# Patient Record
Sex: Male | Born: 1985 | Race: Black or African American | Hispanic: No | Marital: Single | Smoking: Current every day smoker
Health system: Southern US, Community
[De-identification: ages and names within clinical notes are randomized; demographics above are authoritative.]

## PROBLEM LIST (undated history)

## (undated) DIAGNOSIS — T7840XA Allergy, unspecified, initial encounter: Secondary | ICD-10-CM

## (undated) DIAGNOSIS — E669 Obesity, unspecified: Secondary | ICD-10-CM

## (undated) DIAGNOSIS — E119 Type 2 diabetes mellitus without complications: Secondary | ICD-10-CM

## (undated) DIAGNOSIS — I10 Essential (primary) hypertension: Secondary | ICD-10-CM

## (undated) DIAGNOSIS — E559 Vitamin D deficiency, unspecified: Secondary | ICD-10-CM

## (undated) DIAGNOSIS — F17200 Nicotine dependence, unspecified, uncomplicated: Secondary | ICD-10-CM

## (undated) DIAGNOSIS — J45909 Unspecified asthma, uncomplicated: Secondary | ICD-10-CM

## (undated) DIAGNOSIS — E785 Hyperlipidemia, unspecified: Secondary | ICD-10-CM

## (undated) HISTORY — DX: Nicotine dependence, unspecified, uncomplicated: F17.200

## (undated) HISTORY — DX: Hyperlipidemia, unspecified: E78.5

## (undated) HISTORY — DX: Obesity, unspecified: E66.9

## (undated) HISTORY — DX: Allergy, unspecified, initial encounter: T78.40XA

## (undated) HISTORY — DX: Essential (primary) hypertension: I10

## (undated) HISTORY — DX: Type 2 diabetes mellitus without complications: E11.9

## (undated) HISTORY — DX: Vitamin D deficiency, unspecified: E55.9

---
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
## 2004-01-04 ENCOUNTER — Emergency Department (HOSPITAL_COMMUNITY): Admission: EM | Admit: 2004-01-04 | Discharge: 2004-01-05 | Payer: Self-pay

## 2004-05-10 ENCOUNTER — Emergency Department (HOSPITAL_COMMUNITY): Admission: EM | Admit: 2004-05-10 | Discharge: 2004-05-11 | Payer: Self-pay | Admitting: Emergency Medicine

## 2005-03-09 ENCOUNTER — Emergency Department (HOSPITAL_COMMUNITY): Admission: EM | Admit: 2005-03-09 | Discharge: 2005-03-09 | Payer: Self-pay | Admitting: Emergency Medicine

## 2006-05-29 ENCOUNTER — Emergency Department (HOSPITAL_COMMUNITY): Admission: EM | Admit: 2006-05-29 | Discharge: 2006-05-29 | Payer: Self-pay | Admitting: Family Medicine

## 2016-08-06 ENCOUNTER — Emergency Department (HOSPITAL_COMMUNITY)
Admission: EM | Admit: 2016-08-06 | Discharge: 2016-08-07 | Disposition: A | Discharge status: Home / Self Care | Payer: Self-pay | Attending: Emergency Medicine | Admitting: Emergency Medicine

## 2016-08-06 ENCOUNTER — Encounter (HOSPITAL_COMMUNITY): Payer: Self-pay

## 2016-08-06 ENCOUNTER — Emergency Department (HOSPITAL_COMMUNITY): Payer: Self-pay

## 2016-08-06 DIAGNOSIS — J45909 Unspecified asthma, uncomplicated: Secondary | ICD-10-CM | POA: Insufficient documentation

## 2016-08-06 DIAGNOSIS — F17200 Nicotine dependence, unspecified, uncomplicated: Secondary | ICD-10-CM | POA: Insufficient documentation

## 2016-08-06 DIAGNOSIS — M7701 Medial epicondylitis, right elbow: Secondary | ICD-10-CM | POA: Insufficient documentation

## 2016-08-06 DIAGNOSIS — T1490XA Injury, unspecified, initial encounter: Secondary | ICD-10-CM

## 2016-08-06 HISTORY — DX: Unspecified asthma, uncomplicated: J45.909

## 2016-08-06 NOTE — ED Notes (Signed)
Pt states he tweeked his arm 2 weeks ago and the pain has progressively gotten worse. Pt states that tonight he cannot move same.

## 2016-08-06 NOTE — ED Triage Notes (Signed)
Pt states that for the past two weeks he hurt his L elbow at work, the pain keeps getting worse and today he is unable to move his arm, denies injury to arm.

## 2016-08-06 NOTE — ED Provider Notes (Signed)
[facility]-EMERGENCY DEPT Provider Note   CSN: [id number] Arrival date & time: 08/06/16  2256   By signing my name below, I, [name], attest that this documentation has been prepared under the direction and in the presence of Caleb FowlerKayla Topanga Alvelo, PA-C. Electronically Signed: Clarisse GougeXavier [name], Scribe. 08/06/16. 11:27 PM.   History   Chief Complaint Chief Complaint  Patient presents with  . Arm Pain   The history is provided by the patient. No language interpreter was used.    HPI Comments: [name] is a 31 y.o. male who presents to the Emergency Department complaining of gradual onset, moderate, episodic left elbow pain x 2 weeks. States pain is progressively worsening, and he notes movement exacerbates his pain. States extension/flexion and pronation also exacerbate pain. Pt believes he may have injured it. 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 Decreased ROM noted, and he states the elbow feels like it "needs to pop" when he flexes or extends his arm. Pt taking nothing at home for pain. States he is otherwise healthy. Denies wrist/shoulder pain, numbness, weakness, fever, chills, color changes. This has never happened before.   Past Medical History:  Diagnosis Date  . Asthma     There are no active problems to display for this patient.   History reviewed. No pertinent surgical history.     Home Medications    Prior to Admission medications   Medication Sig Start Date End Date Taking? Authorizing Provider  naproxen (NAPROSYN) 500 MG tablet Take 1 tablet (500 mg total) by mouth 2 (two) times daily. 08/07/16   Caleb FowlerKayla Petula Rotolo, PA-C    Family History No family history on file.  Social History Social History  Substance Use Topics  . Smoking status: Current Every Day Smoker  . Smokeless tobacco: Never Used  . Alcohol use No     Allergies   Patient has no known allergies.   Review of Systems Review  of Systems  Constitutional: Negative for activity change, chills, diaphoresis and fatigue.  Respiratory: Negative for chest tightness and shortness of breath.   Cardiovascular: Negative for chest pain.  Gastrointestinal: Negative for abdominal pain.  Musculoskeletal: Positive for arthralgias. Negative for myalgias.  Skin: Negative for wound.  Neurological: Negative for dizziness, weakness, light-headedness, numbness and headaches.  Psychiatric/Behavioral: Negative for confusion.     Physical Exam Updated Vital Signs BP 154/92   Pulse 93   Temp 98.7 F (37.1 C)   Resp 20   Ht 5\' 10"  (1.778 m)   Wt 265 lb (120.2 kg)   SpO2 100%   BMI 38.02 kg/m   Physical Exam  Constitutional: He is oriented to person, place, and time. He appears well-developed and well-nourished.  HENT:  Head: Normocephalic and atraumatic.  Right Ear: External ear normal.  Left Ear: External ear normal.  Eyes: Conjunctivae are normal. No scleral icterus.  Neck: No tracheal deviation present.  Cardiovascular:  Pulses:      Radial pulses are 2+ on the right side, and 2+ on the left side.  Pulmonary/Chest: Effort normal. No respiratory distress.  Abdominal: He exhibits no distension.  Musculoskeletal: Normal range of motion.  Right elbow: No tenderness. No swelling, warmth, or erythema.  Decreased ROM in flexion and extension due to pain. Pain with resisted forearm pronation.  Neurological: He is alert and oriented to person, place, and time.  5/5 strength throughout bilateral upper extremities. Sensation intact to light touch.   Skin: Skin is warm  and dry.  Psychiatric: He has a normal mood and affect. His behavior is normal.     ED Treatments / Results  DIAGNOSTIC STUDIES: Oxygen Saturation is 100% on RA, normal by my interpretation.    COORDINATION OF CARE: 11:27 PM Discussed treatment plan with pt at bedside and pt agreed to plan.  Labs (all labs ordered are listed, but only abnormal results are  displayed) Labs Reviewed - No data to display  EKG  EKG Interpretation None       Radiology Dg Elbow Complete Right (3+view)  Result Date: 08/07/2016 CLINICAL DATA:  RIGHT elbow pain since this morning, no known injury, question related to lifting heavy oxygen tank is at work EXAM: RIGHT ELBOW - COMPLETE 3+ VIEW COMPARISON:  None FINDINGS: Nonstandard imaging, elbow partially flexed on all views. Osseous mineralization normal. Joint spaces preserved. No acute fracture, dislocation, or bone destruction. No elbow joint effusion. IMPRESSION: No acute osseous abnormalities. Electronically Signed   By: Ulyses Southward M.D.   On: 08/07/2016 00:01    Procedures Procedures (including critical care time)  Medications Ordered in ED Medications  naproxen (NAPROSYN) tablet 500 mg (500 mg Oral Given 08/07/16 0128)  acetaminophen (TYLENOL) tablet 1,000 mg (1,000 mg Oral Given 08/07/16 0128)     Initial Impression / Assessment and Plan / ED Course  I have reviewed the triage vital signs and the nursing notes.  Pertinent labs & imaging results that were available during my care of the patient were reviewed by me and considered in my medical decision making (see chart for details).  Will order Xr and naproxen.  Clinical Course    Findings c/w in medial epicondylitis.  Neurovascularly intact.  No signs of overlying infection.  Xrays normal.  Recommend treatment with NSAIDs and ortho follow up.  Return precautions discussed.  Stable for discharge.   Final Clinical Impressions(s) / ED Diagnoses   Final diagnoses:  Medial epicondylitis of right elbow    New Prescriptions Discharge Medication List as of 08/07/2016 12:55 AM    START taking these medications   Details  naproxen (NAPROSYN) 500 MG tablet Take 1 tablet (500 mg total) by mouth 2 (two) times daily., Starting Sun 08/07/2016, Print       I personally performed the services described in this documentation, which was scribed in my  presence. The recorded information has been reviewed and is accurate.    Caleb Fowler, PA-C 08/07/16 0151    Melene Plan, DO 08/07/16 1610

## 2016-08-07 MED ORDER — ACETAMINOPHEN 500 MG PO TABS
1000.0000 mg | ORAL_TABLET | Freq: Once | ORAL | Status: AC
  Administered 2016-08-07: 1000 mg via ORAL
  Filled 2016-08-07: qty 2

## 2016-08-07 MED ORDER — NAPROXEN 250 MG PO TABS
500.0000 mg | ORAL_TABLET | Freq: Once | ORAL | Status: AC
  Administered 2016-08-07: 500 mg via ORAL
  Filled 2016-08-07: qty 2

## 2016-08-07 MED ORDER — NAPROXEN 500 MG PO TABS: 500.0000 mg | ORAL_TABLET | Freq: Two times a day (BID) | ORAL | 0 refills | Status: DC

## 2016-09-16 ENCOUNTER — Emergency Department (HOSPITAL_COMMUNITY)
Admission: EM | Admit: 2016-09-16 | Discharge: 2016-09-16 | Disposition: A | Discharge status: Home / Self Care | Payer: 59 | Attending: Emergency Medicine | Admitting: Emergency Medicine

## 2016-09-16 ENCOUNTER — Emergency Department (HOSPITAL_COMMUNITY): Payer: 59

## 2016-09-16 ENCOUNTER — Encounter (HOSPITAL_COMMUNITY): Payer: Self-pay | Admitting: Emergency Medicine

## 2016-09-16 DIAGNOSIS — S0990XA Unspecified injury of head, initial encounter: Secondary | ICD-10-CM | POA: Diagnosis present

## 2016-09-16 DIAGNOSIS — S0102XA Laceration with foreign body of scalp, initial encounter: Secondary | ICD-10-CM | POA: Diagnosis not present

## 2016-09-16 DIAGNOSIS — S0003XA Contusion of scalp, initial encounter: Secondary | ICD-10-CM | POA: Diagnosis not present

## 2016-09-16 DIAGNOSIS — Y9389 Activity, other specified: Secondary | ICD-10-CM | POA: Insufficient documentation

## 2016-09-16 DIAGNOSIS — Z23 Encounter for immunization: Secondary | ICD-10-CM | POA: Diagnosis not present

## 2016-09-16 DIAGNOSIS — Y92009 Unspecified place in unspecified non-institutional (private) residence as the place of occurrence of the external cause: Secondary | ICD-10-CM | POA: Insufficient documentation

## 2016-09-16 DIAGNOSIS — S0101XA Laceration without foreign body of scalp, initial encounter: Secondary | ICD-10-CM | POA: Diagnosis not present

## 2016-09-16 DIAGNOSIS — W1839XA Other fall on same level, initial encounter: Secondary | ICD-10-CM | POA: Insufficient documentation

## 2016-09-16 DIAGNOSIS — J45909 Unspecified asthma, uncomplicated: Secondary | ICD-10-CM | POA: Insufficient documentation

## 2016-09-16 DIAGNOSIS — S064X0A Epidural hemorrhage without loss of consciousness, initial encounter: Secondary | ICD-10-CM | POA: Diagnosis not present

## 2016-09-16 DIAGNOSIS — F17200 Nicotine dependence, unspecified, uncomplicated: Secondary | ICD-10-CM | POA: Diagnosis not present

## 2016-09-16 DIAGNOSIS — Y999 Unspecified external cause status: Secondary | ICD-10-CM | POA: Diagnosis not present

## 2016-09-16 DIAGNOSIS — S0181XA Laceration without foreign body of other part of head, initial encounter: Secondary | ICD-10-CM | POA: Diagnosis not present

## 2016-09-16 LAB — CBG MONITORING, ED
GLUCOSE-CAPILLARY: 402 mg/dL — AB (ref 65–99)
Glucose-Capillary: 299 mg/dL — ABNORMAL HIGH (ref 65–99)

## 2016-09-16 MED ORDER — LORAZEPAM 2 MG/ML IJ SOLN
1.0000 mg | Freq: Once | INTRAMUSCULAR | Status: AC
  Administered 2016-09-16: 1 mg via INTRAVENOUS

## 2016-09-16 MED ORDER — LIDOCAINE-EPINEPHRINE 1 %-1:100000 IJ SOLN
30.0000 mL | Freq: Once | INTRAMUSCULAR | Status: DC
  Filled 2016-09-16: qty 30

## 2016-09-16 MED ORDER — LIDOCAINE-EPINEPHRINE (PF) 1 %-1:200000 IJ SOLN
30.0000 mL | Freq: Once | INTRAMUSCULAR | Status: AC
  Administered 2016-09-16: 30 mL via INTRADERMAL
  Filled 2016-09-16: qty 30

## 2016-09-16 MED ORDER — DOXYCYCLINE HYCLATE 100 MG PO CAPS: 100.0000 mg | ORAL_CAPSULE | Freq: Two times a day (BID) | ORAL | 0 refills | Status: DC

## 2016-09-16 MED ORDER — INSULIN ASPART 100 UNIT/ML ~~LOC~~ SOLN
15.0000 [IU] | Freq: Once | SUBCUTANEOUS | Status: AC
  Administered 2016-09-16: 15 [IU] via SUBCUTANEOUS
  Filled 2016-09-16: qty 1

## 2016-09-16 MED ORDER — LORAZEPAM 2 MG/ML IJ SOLN
INTRAMUSCULAR | Status: AC
  Filled 2016-09-16: qty 1

## 2016-09-16 MED ORDER — TETANUS-DIPHTH-ACELL PERTUSSIS 5-2.5-18.5 LF-MCG/0.5 IM SUSP
0.5000 mL | Freq: Once | INTRAMUSCULAR | Status: AC
  Administered 2016-09-16: 0.5 mL via INTRAMUSCULAR
  Filled 2016-09-16: qty 0.5

## 2016-09-16 NOTE — ED Triage Notes (Signed)
Pt brought to ED by [facility] from home for a neck injury, pt was drinking alcohol all day long states he had 12 onz bear x 9 and couple of liquor shots. Pt hit a mirror with his head and break the glass, because he got upset. Pt denies any LOC, right side hematoma bleeding and right side back of his neck 1/2 inch lac bleeding, c collar on place by EMS. BP 158/102, HR 100 96% RA.

## 2016-09-16 NOTE — ED Notes (Signed)
Quick clot applied to wound per Dr. Request.  Pt to CT

## 2016-09-16 NOTE — ED Provider Notes (Addendum)
[facility] Provider Note   CSN: [id number] Arrival date & time: 09/16/16  0108  By signing my name below, I, Rosario Adie, attest that this documentation has been prepared under the direction and in the presence of Gilda Crease, MD. Electronically Signed: Rosario Adie, ED Scribe. 09/16/16. 1:23 AM.  History   Chief Complaint Chief Complaint  Patient presents with  . Neck Injury   The history is provided by the patient. No language interpreter was used.    HPI Comments: [name] is a 31 y.o. male BIB [facility], with a h/o asthma, who presents to the Emergency Department complaining of wound sustained to the right side of the neck which occurred prior to arrival. Per pt, he became upset tonight while drinking alcohol and shattered a mirror over his head, sustaining his wound. Bleeding is controlled with pressure dressing. No LOC. He notes associated sensation of foreign body to the wound. No other areas of pain or known injury. Pt states that prior to this incident he drank 9 12 oz beers and a couple shots of liquor. EMS placed a c-collar and controlled bleeding en route. No other noted treatments were administered. He denies nausea, vomiting, dizziness, or any other associated symptoms.   Past Medical History:  Diagnosis Date  . Asthma    There are no active problems to display for this patient.  History reviewed. No pertinent surgical history.  Home Medications    Prior to Admission medications   Medication Sig Start Date End Date Taking? Authorizing Provider  naproxen (NAPROSYN) 500 MG tablet Take 1 tablet (500 mg total) by mouth 2 (two) times daily. 08/07/16   Cheri Fowler, PA-C   Family History History reviewed. No pertinent family history.  Social History Social History  Substance Use Topics  . Smoking status: Current Every Day Smoker  . Smokeless tobacco: Never Used  . Alcohol use 7.8 oz/week    9 Cans of beer, 4 Shots of liquor per week    Allergies   Patient has no known allergies.  Review of Systems Review of Systems  Gastrointestinal: Negative for nausea and vomiting.  Musculoskeletal: Positive for myalgias and neck pain.  Skin: Positive for wound.  Neurological: Negative for dizziness and syncope.  All other systems reviewed and are negative.  Physical Exam Updated Vital Signs BP 147/87 (BP Location: Right Arm)   Pulse 79   Temp 98.7 F (37.1 C) (Axillary)   Resp 17   Ht 5\' 9"  (1.753 m)   Wt 275 lb (124.7 kg)   SpO2 95%   BMI 40.61 kg/m   Physical Exam  Constitutional: He is oriented to person, place, and time. He appears well-developed and well-nourished. No distress.  HENT:  Head: Normocephalic.  Right Ear: Hearing normal.  Left Ear: Hearing normal.  Nose: Nose normal.  Mouth/Throat: Oropharynx is clear and moist and mucous membranes are normal.  Large right parietal hematoma. 3.5cm linear laceration in posterior auricular scalp  Eyes: Conjunctivae and EOM are normal. Pupils are equal, round, and reactive to light.  Neck: Normal range of motion. Neck supple.  Cardiovascular: Regular rhythm, S1 normal and S2 normal.  Exam reveals no gallop and no friction rub.   No murmur heard. Pulmonary/Chest: Effort normal and breath sounds normal. No respiratory distress. He exhibits no tenderness.  Abdominal: Soft. Normal appearance and bowel sounds are normal. There is no hepatosplenomegaly. There is no tenderness. There is no rebound, no guarding, no tenderness at McBurney's point and  negative Murphy's sign. No hernia.  Musculoskeletal: Normal range of motion.  Neurological: He is alert and oriented to person, place, and time. He has normal strength. No cranial nerve deficit or sensory deficit. Coordination normal. GCS eye subscore is 4. GCS verbal subscore is 5. GCS motor subscore is 6.  Skin: Skin is warm, dry and intact. No rash noted. No cyanosis.  Psychiatric: He has a normal mood and affect. His speech is  normal and behavior is normal. Thought content normal.  Nursing note and vitals reviewed.     ED Treatments / Results  DIAGNOSTIC STUDIES: Oxygen Saturation is 97% on RA, normal by my interpretation.   COORDINATION OF CARE: 1:22 AM-Discussed next steps with pt. Pt verbalized understanding and is agreeable with the plan.   Labs (all labs ordered are listed, but only abnormal results are displayed) Labs Reviewed  CBG MONITORING, ED - Abnormal; Notable for the following:       Result Value   Glucose-Capillary 402 (*)    All other components within normal limits    EKG  EKG Interpretation None      Radiology Ct Head Wo Contrast  Result Date: 09/16/2016 CLINICAL DATA:  Fall laceration EXAM: CT HEAD WITHOUT CONTRAST CT CERVICAL SPINE WITHOUT CONTRAST TECHNIQUE: Multidetector CT imaging of the head and cervical spine was performed following the standard protocol without intravenous contrast. Multiplanar CT image reconstructions of the cervical spine were also generated. COMPARISON:  None. FINDINGS: CT HEAD FINDINGS Brain: No acute territorial infarction or intracranial hemorrhage is visualized. There is no intracranial mass. The ventricles are nonenlarged. Vascular: No hyperdense vessel or unexpected calcification. Skull: No fracture. Sinuses/Orbits: No acute finding. Other: Large right parietal and posterior frontal scalp laceration and hematoma with soft tissue air bubbles. There is a a approximate 1.8 x 6.1 cm triangular-shaped foreign body within the scalp soft tissues suspicious for retained glass. Multiple smaller retained foreign bodies are visualized at the posterior, inferior margin of the dominant foreign body. CT CERVICAL SPINE FINDINGS Alignment: Straightening of the cervical spine. No subluxation. Facet alignment within normal limits. Skull base and vertebrae: No acute fracture. No primary bone lesion or focal pathologic process. Soft tissues and spinal canal: No prevertebral  fluid or swelling. No visible canal hematoma. Disc levels: No significant disc space narrowing or foraminal stenosis. Upper chest: Thyroid normal.  Lung apices clear. Other: Skin thickening and subcutaneous edema posteriorly. IMPRESSION: 1. No CT evidence for acute intracranial abnormality 2. Large right parietal and posterior frontal scalp laceration and hematoma. Triangular-shaped foreign body measuring at least 6.1 cm within the right scalp soft tissues suspicious for glass fragment. Multiple smaller foreign bodies are visualized along the posterior, inferior aspect of the dominant foreign body. 3. Straightening of the cervical spine. No acute fracture or malalignment. Posterior skin thickening and subcutaneous edema. Electronically Signed   By: Jasmine Pang M.D.   On: 09/16/2016 02:06   Ct Cervical Spine Wo Contrast  Result Date: 09/16/2016 CLINICAL DATA:  Fall laceration EXAM: CT HEAD WITHOUT CONTRAST CT CERVICAL SPINE WITHOUT CONTRAST TECHNIQUE: Multidetector CT imaging of the head and cervical spine was performed following the standard protocol without intravenous contrast. Multiplanar CT image reconstructions of the cervical spine were also generated. COMPARISON:  None. FINDINGS: CT HEAD FINDINGS Brain: No acute territorial infarction or intracranial hemorrhage is visualized. There is no intracranial mass. The ventricles are nonenlarged. Vascular: No hyperdense vessel or unexpected calcification. Skull: No fracture. Sinuses/Orbits: No acute finding. Other: Large right parietal and posterior  frontal scalp laceration and hematoma with soft tissue air bubbles. There is a a approximate 1.8 x 6.1 cm triangular-shaped foreign body within the scalp soft tissues suspicious for retained glass. Multiple smaller retained foreign bodies are visualized at the posterior, inferior margin of the dominant foreign body. CT CERVICAL SPINE FINDINGS Alignment: Straightening of the cervical spine. No subluxation. Facet  alignment within normal limits. Skull base and vertebrae: No acute fracture. No primary bone lesion or focal pathologic process. Soft tissues and spinal canal: No prevertebral fluid or swelling. No visible canal hematoma. Disc levels: No significant disc space narrowing or foraminal stenosis. Upper chest: Thyroid normal.  Lung apices clear. Other: Skin thickening and subcutaneous edema posteriorly. IMPRESSION: 1. No CT evidence for acute intracranial abnormality 2. Large right parietal and posterior frontal scalp laceration and hematoma. Triangular-shaped foreign body measuring at least 6.1 cm within the right scalp soft tissues suspicious for glass fragment. Multiple smaller foreign bodies are visualized along the posterior, inferior aspect of the dominant foreign body. 3. Straightening of the cervical spine. No acute fracture or malalignment. Posterior skin thickening and subcutaneous edema. Electronically Signed   By: Jasmine Pang M.D.   On: 09/16/2016 02:06   Procedures .Foreign Body Removal Date/Time: 09/16/2016 3:29 AM Performed by: Gilda Crease Authorized by: Gilda Crease  Consent: Verbal consent obtained. Risks and benefits: risks, benefits and alternatives were discussed Consent given by: patient Patient understanding: patient states understanding of the procedure being performed Patient consent: the patient's understanding of the procedure matches consent given Procedure consent: procedure consent matches procedure scheduled Relevant documents: relevant documents present and verified Test results: test results available and properly labeled Site marked: the operative site was marked Imaging studies: imaging studies available Required items: required blood products, implants, devices, and special equipment available Patient identity confirmed: verbally with patient and arm band Time out: Immediately prior to procedure a "time out" was called to verify the correct  patient, procedure, equipment, support staff and site/side marked as required. Intake: right parietal region. Anesthesia: local infiltration  Anesthesia: Local Anesthetic: lidocaine 1% with epinephrine Anesthetic total: 15 mL  Sedation: Patient sedated: no Patient restrained: no Patient cooperative: yes 1 objects recovered. Objects recovered: triangular piece of glass Post-procedure assessment: foreign body removed Patient tolerance: Patient tolerated the procedure well with no immediate complications .Marland KitchenLaceration Repair Date/Time: 09/16/2016 3:29 AM Performed by: Gilda Crease Authorized by: Gilda Crease   Consent:    Consent obtained:  Verbal   Consent given by:  Patient   Risks discussed:  Infection, pain, poor cosmetic result and poor wound healing   Alternatives discussed:  No treatment Anesthesia (see MAR for exact dosages):    Anesthesia method:  Local infiltration   Local anesthetic:  Lidocaine 1% WITH epi Laceration details:    Location:  Scalp   Scalp location:  R parietal   Length (cm):  3.5 Repair type:    Repair type:  Simple Pre-procedure details:    Preparation:  Patient was prepped and draped in usual sterile fashion and imaging obtained to evaluate for foreign bodies Exploration:    Hemostasis achieved with:  Epinephrine   Wound exploration: wound explored through full range of motion and entire depth of wound probed and visualized     Wound extent: foreign bodies/material     Contaminated: no   Treatment:    Area cleansed with:  Saline and Betadine   Amount of cleaning:  Standard   Irrigation solution:  Sterile saline  Irrigation method:  Syringe and pressure wash   Visualized foreign bodies/material removed: yes   Skin repair:    Repair method:  Staples   Number of staples:  7 Approximation:    Approximation:  Close   Vermilion border: well-aligned   Post-procedure details:    Dressing:  Open (no dressing)   Patient  tolerance of procedure:  Tolerated well, no immediate complications    Medications Ordered in ED Medications  LORazepam (ATIVAN) 2 MG/ML injection (0 mg  Hold 09/16/16 0333)  Tdap (BOOSTRIX) injection 0.5 mL (0.5 mLs Intramuscular Given 09/16/16 0157)  lidocaine-EPINEPHrine (XYLOCAINE-EPINEPHrine) 1 %-1:200000 (PF) injection 30 mL (30 mLs Intradermal Given 09/16/16 0302)  insulin aspart (novoLOG) injection 15 Units (15 Units Subcutaneous Given 09/16/16 0332)  LORazepam (ATIVAN) injection 1 mg (1 mg Intravenous Given 09/16/16 0324)   Initial Impression / Assessment and Plan / ED Course  I have reviewed the triage vital signs and the nursing notes.  Pertinent labs & imaging results that were available during my care of the patient were reviewed by me and considered in my medical decision making (see chart for details).     Patient presents to the emergency department for evaluation of injury to the right side of his head and neck. Patient admits to drinking alcohol all day long and becoming distraught. He reportedly grabbed a mere embrocated over his head. He reports that he was very upset when this happened, now realizes this was a bad thing to do. He is remorseful and upset that he put himself in this situation. He is not homicidal or suicidal at this time.  Patient had a moderate amount of bleeding still present at arrival. CT scan was performed and does show a significant piece of glass in the scalp. Area was anesthetized with lidocaine and epinephrine. Bleeding was controlled. Exploratoration of the wound located the foreign body and it was removed with forceps in its entirety. Wound was irrigated with copious amounts of saline. Staples were utilized to close the scalp. Patient will need staple removal in 10 days. He will be placed on empiric antbiotics.  Addendum: 1610: 0705 AM - patient now awake and alert. Once again he confirms that he is not homicidal or suicidal. Was given outpatient resources  for alcohol and depression.  Final Clinical Impressions(s) / ED Diagnoses   Final diagnoses:  Scalp laceration, initial encounter   New Prescriptions New Prescriptions   No medications on file   I personally performed the services described in this documentation, which was scribed in my presence. The recorded information has been reviewed and is accurate.     Gilda Creasehristopher J Carolena Fairbank, MD 09/16/16 [id number]    Gilda Creasehristopher J Peytyn Trine, MD 09/16/16 [phone]

## 2016-09-16 NOTE — ED Notes (Signed)
CBG for EMS 432 PTA.

## 2016-09-26 ENCOUNTER — Encounter (HOSPITAL_COMMUNITY): Payer: Self-pay | Admitting: Family Medicine

## 2016-09-26 ENCOUNTER — Ambulatory Visit (HOSPITAL_COMMUNITY)
Admission: EM | Admit: 2016-09-26 | Discharge: 2016-09-26 | Disposition: A | Discharge status: Home / Self Care | Payer: 59 | Attending: Family Medicine | Admitting: Family Medicine

## 2016-09-26 DIAGNOSIS — Z4802 Encounter for removal of sutures: Secondary | ICD-10-CM | POA: Diagnosis not present

## 2016-09-26 NOTE — ED Triage Notes (Signed)
Pt here for staple removal to head  

## 2016-09-26 NOTE — Discharge Instructions (Signed)
Return in 2 months if fluctuance persists

## 2016-09-26 NOTE — ED Provider Notes (Signed)
[facility]    CSN: [id number] Arrival date & time: 09/26/16  1214     History   Chief Complaint Chief Complaint  Patient presents with  . Suture / Staple Removal    HPI [name] is a 31 y.o. male.   Patient is presenting to the care center 10 days after having stitches put in the right side of his scalp for a laceration caused by broken glass. He remains quite swollen in the area but there is no tenderness or discharge. In addition he still has a subconjunctival hemorrhage on the right eye but much of the swelling around the eye is gone down.      Past Medical History:  Diagnosis Date  . Asthma     There are no active problems to display for this patient.   History reviewed. No pertinent surgical history.     Home Medications    Prior to Admission medications   Medication Sig Start Date End Date Taking? Authorizing Provider  doxycycline (VIBRAMYCIN) 100 MG capsule Take 1 capsule (100 mg total) by mouth 2 (two) times daily. 09/16/16   Gilda Creasehristopher J Pollina, MD  naproxen (NAPROSYN) 500 MG tablet Take 1 tablet (500 mg total) by mouth 2 (two) times daily. 08/07/16   Cheri FowlerKayla Rose, PA-C    Family History History reviewed. No pertinent family history.  Social History Social History  Substance Use Topics  . Smoking status: Current Every Day Smoker  . Smokeless tobacco: Never Used  . Alcohol use 7.8 oz/week    9 Cans of beer, 4 Shots of liquor per week     Allergies   Patient has no known allergies.   Review of Systems Review of Systems  Constitutional: Negative.   HENT: Negative.   Eyes: Positive for redness.  Skin: Positive for wound.  Neurological: Negative.      Physical Exam Triage Vital Signs ED Triage Vitals  Enc Vitals Group     BP      Pulse      Resp      Temp      Temp src      SpO2      Weight      Height      Head Circumference      Peak Flow      Pain Score      Pain Loc      Pain Edu?      Excl. in GC?    No  data found.   Updated Vital Signs There were no vitals taken for this visit.   Physical Exam  Constitutional: He is oriented to person, place, and time. He appears well-developed and well-nourished.  HENT:  Right Ear: External ear normal.  Left Ear: External ear normal.  Mouth/Throat: Oropharynx is clear and moist.  Eyes: EOM are normal. Pupils are equal, round, and reactive to light.  Right lateral subconjunctival hemorrhage  Neck: Normal range of motion. Neck supple.  Musculoskeletal: Normal range of motion.  Neurological: He is alert and oriented to person, place, and time.  Skin: Skin is warm and dry.  7 staples removed from right postauricular laceration area. Wound is well approximated and there is no drainage but there is significant subcutaneous cutaneous swelling and fluctuance underlying the laceration line.  Nursing note and vitals reviewed.    UC Treatments / Results  Labs (all labs ordered are listed, but only abnormal results are displayed) Labs Reviewed - No data to display  EKG  EKG Interpretation None       Radiology No results found.  Procedures Procedures (including critical care time)  Medications Ordered in UC Medications - No data to display   Initial Impression / Assessment and Plan / UC Course  I have reviewed the triage vital signs and the nursing notes.  Pertinent labs & imaging results that were available during my care of the patient were reviewed by me and considered in my medical decision making (see chart for details).     Final Clinical Impressions(s) / UC Diagnoses   Final diagnoses:  Removal of staples    New Prescriptions New Prescriptions   No medications on file     Elvina Sidle, MD 09/26/16 1238

## 2016-10-08 ENCOUNTER — Ambulatory Visit (HOSPITAL_COMMUNITY)
Admission: EM | Admit: 2016-10-08 | Discharge: 2016-10-08 | Disposition: A | Discharge status: Home / Self Care | Payer: 59 | Attending: Family Medicine | Admitting: Family Medicine

## 2016-10-08 ENCOUNTER — Encounter (HOSPITAL_COMMUNITY): Payer: Self-pay | Admitting: *Deleted

## 2016-10-08 DIAGNOSIS — Z5189 Encounter for other specified aftercare: Secondary | ICD-10-CM

## 2016-10-08 NOTE — Discharge Instructions (Signed)
Your wound appears to be well healing, the edges are approximated, no area that appears to have been bleeding earlier has scabbed over. Keep your wound clean, dry, you may continue to wash your hair, however be gentle when washing the area near your wound. It will take several weeks for the skin in this area to return to full strength. Should your wound open up, return to clinic, or go to the emergency room

## 2016-10-08 NOTE — ED Triage Notes (Signed)
Patient had staples removed from right scalpel region on the 5th, states today when he washed his hair he noticed bleeding. No bleeding at this time, wound looks well approximated and healing.

## 2016-10-08 NOTE — ED Provider Notes (Signed)
CSN: [id number]     Arrival date & time 10/08/16  1457 History   First MD Initiated Contact with Patient 10/08/16 1625     Chief Complaint  Patient presents with  . Suture / Staple Removal   (Consider location/radiation/quality/duration/timing/severity/associated sxs/prior Treatment) 31 year old male presents to clinic for evaluation of a wound to the right side of his head. One month ago he had cut the side of his head with a 4 inch piece of glass, it was subsequently closed with staples, and the staples were removed on 10/06/2016. He reports today's the first day he had thoroughly tried to clean his hair, and that he noticed blood coming down the side of his face. He denies any pain, is not currently bleeding, his wishes to have his wound examined.   The history is provided by the patient.    Past Medical History:  Diagnosis Date  . Asthma    History reviewed. No pertinent surgical history. History reviewed. No pertinent family history. Social History  Substance Use Topics  . Smoking status: Current Every Day Smoker  . Smokeless tobacco: Never Used  . Alcohol use 7.8 oz/week    9 Cans of beer, 4 Shots of liquor per week    Review of Systems  Reason unable to perform ROS: as covered in HPI.  All other systems reviewed and are negative.   Allergies  Patient has no known allergies.  Home Medications   Prior to Admission medications   Medication Sig Start Date End Date Taking? Authorizing Provider  doxycycline (VIBRAMYCIN) 100 MG capsule Take 1 capsule (100 mg total) by mouth 2 (two) times daily. 09/16/16   Gilda Creasehristopher J Pollina, MD  naproxen (NAPROSYN) 500 MG tablet Take 1 tablet (500 mg total) by mouth 2 (two) times daily. 08/07/16   Cheri FowlerKayla Rose, PA-C   Meds Ordered and Administered this Visit  Medications - No data to display  BP (!) 147/84 (BP Location: Right Arm)   Pulse 93   Temp 99 F (37.2 C) (Oral)   Resp 17   SpO2 98%  No data found.   Physical Exam   Constitutional: He is oriented to person, place, and time. He appears well-developed and well-nourished. No distress.  HENT:  Head: Normocephalic and atraumatic.  Right Ear: External ear normal.  Left Ear: External ear normal.  Neck: Normal range of motion. Neck supple.  Neurological: He is alert and oriented to person, place, and time.  Skin: Skin is warm and dry. Capillary refill takes less than 2 seconds. He is not diaphoretic.  4 cm, well healing, laceration with edges approximated, with a small area of scabbing. There is no sign of infection, and there is no active bleeding.  Psychiatric: He has a normal mood and affect. His behavior is normal.  Nursing note and vitals reviewed.   Urgent Care Course     Procedures (including critical care time)  Labs Review Labs Reviewed - No data to display  Imaging Review No results found.    MDM   1. Encounter for wound re-check     Patient given advice on hygiene regarding his wound, advised to be careful when cleaning around the area for the next several weeks. Should his laceration opened, return to clinic, or go to the emergency room. No further action needed at this time     Dorena BodoLawrence Giulio Bertino, NP 10/08/16 [phone]

## 2018-10-02 IMAGING — CT CT HEAD W/O CM
3 of 7 series · 14 of 47 positions shown, 17 images · non-contrast
Comparison: None.

CLINICAL DATA: Fall laceration

EXAM:
CT HEAD WITHOUT CONTRAST
CT CERVICAL SPINE WITHOUT CONTRAST
TECHNIQUE: Multidetector CT imaging of the head and cervical spine was
performed following the standard protocol without intravenous
contrast. Multiplanar CT image reconstructions of the cervical spine
were also generated.

[Series 6: head 3.0 mpr cor · coronal · 0.37mm/px · 3 of 79 slices shown]
[im 16/79  brain]
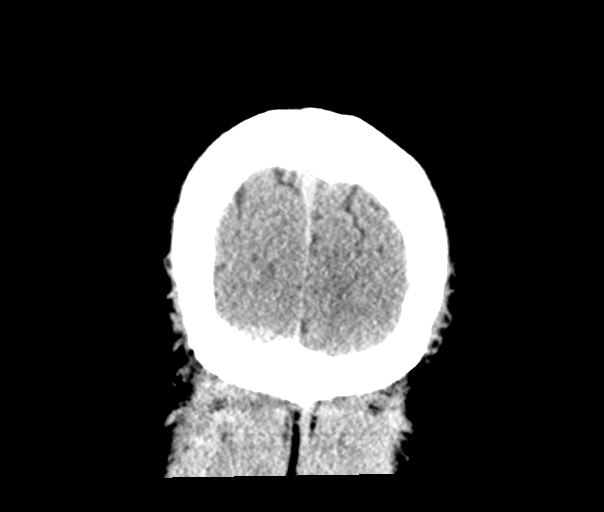
[im 32/79  brain]
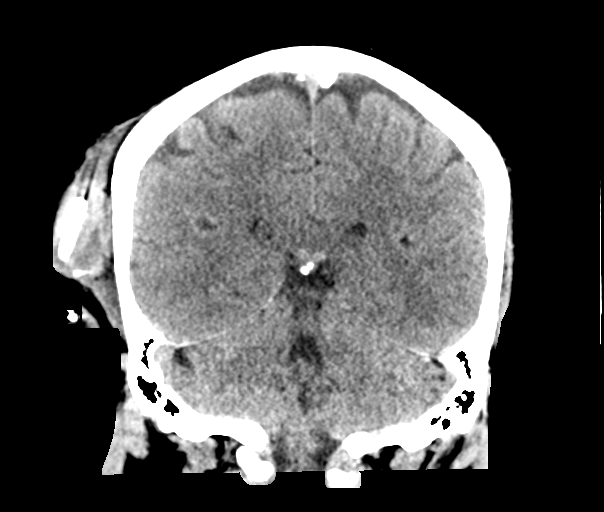
[im 47/79  brain]
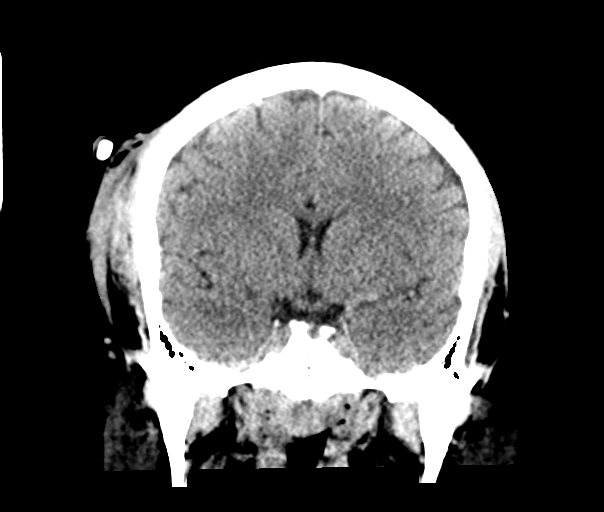

[Series 7: head 3.0 mpr sag · sagittal · 0.34mm/px · 2 of 67 slices shown]
[im 23/67  brain]
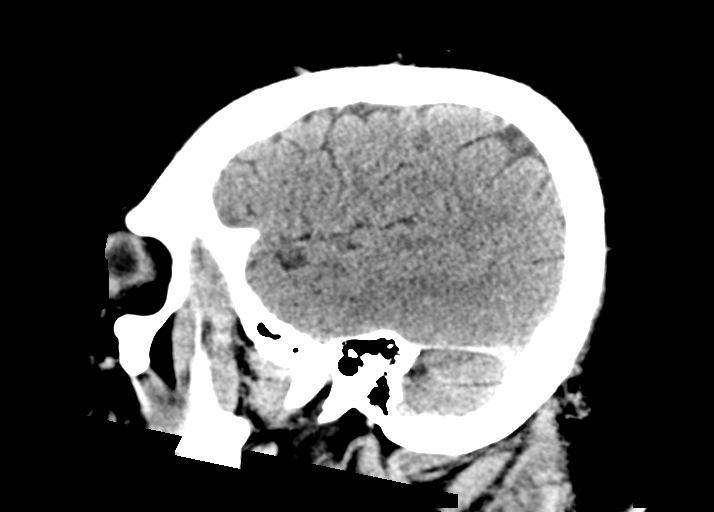
[im 45/67  brain]
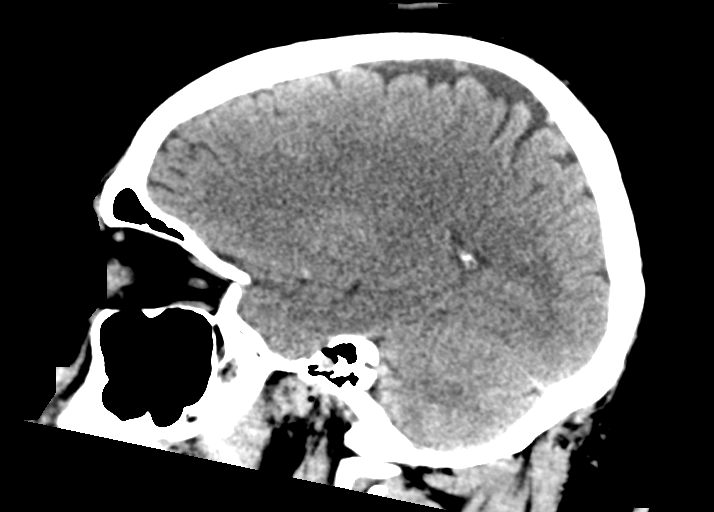

[Series 11: orthogonal axial st · axial · 0.21mm/px · z∈[-245,-112]mm · 9 of 86 slices shown, 12 images]
[im 8/86  brain]
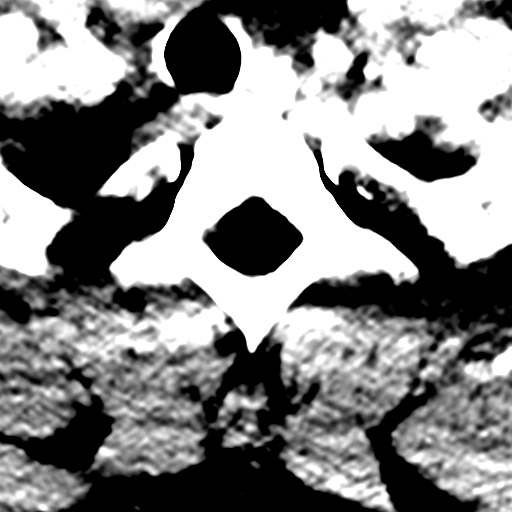
[im 8/86  bone]
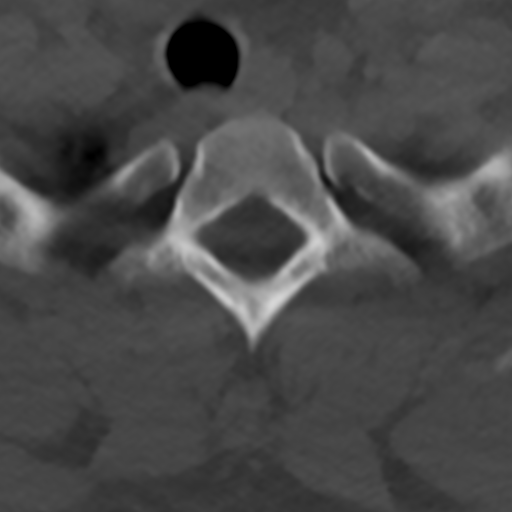
[im 16/86  brain]
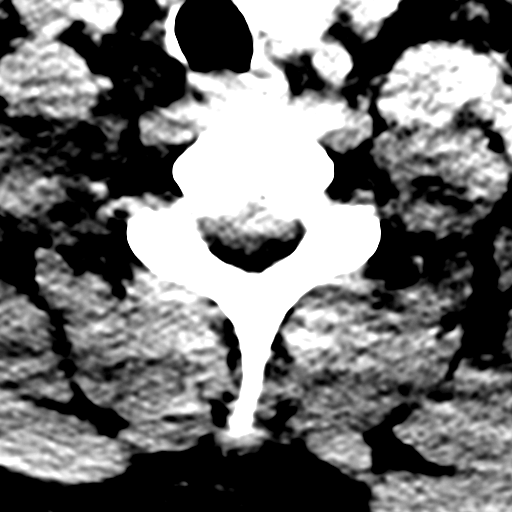
[im 24/86  brain]
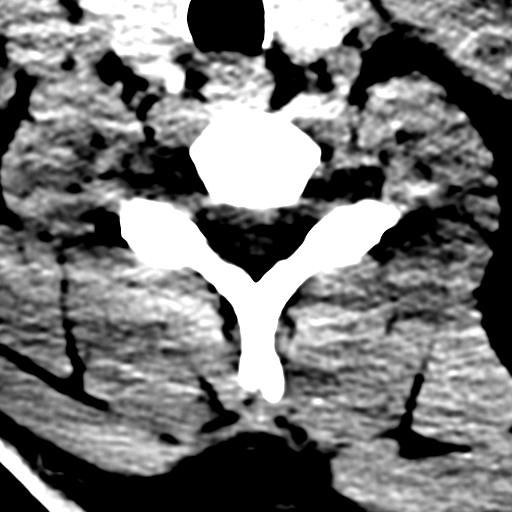
[im 31/86  brain]
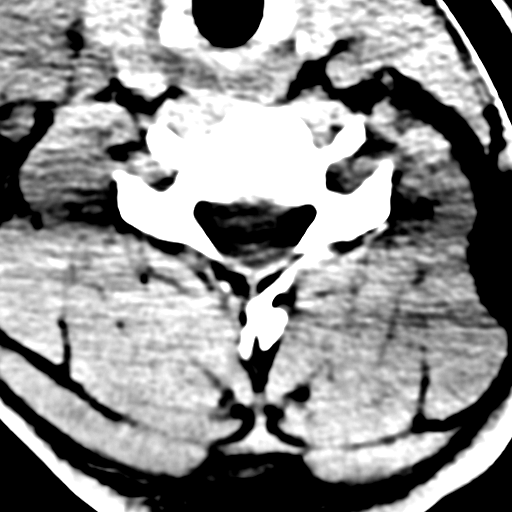
[im 47/86  brain]
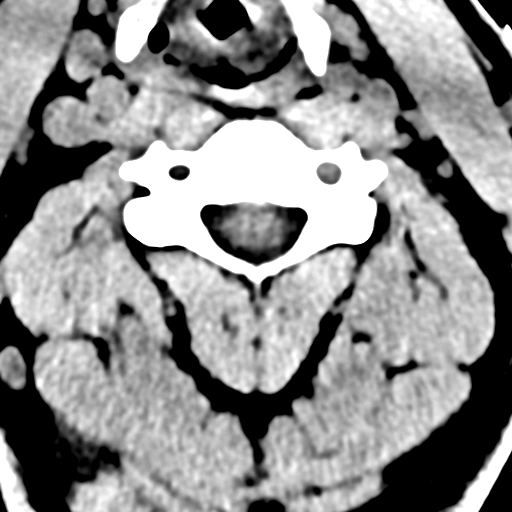
[im 47/86  bone]
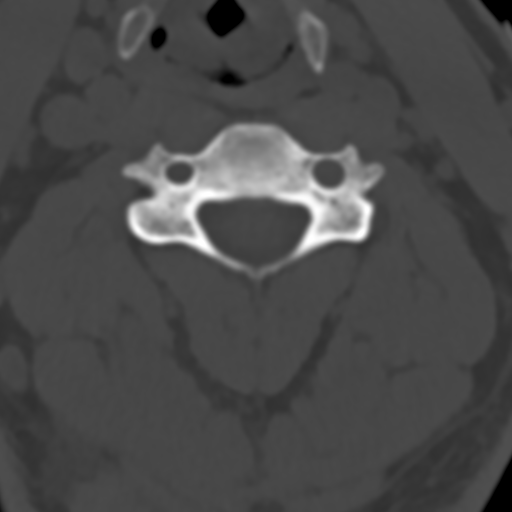
[im 55/86  brain]
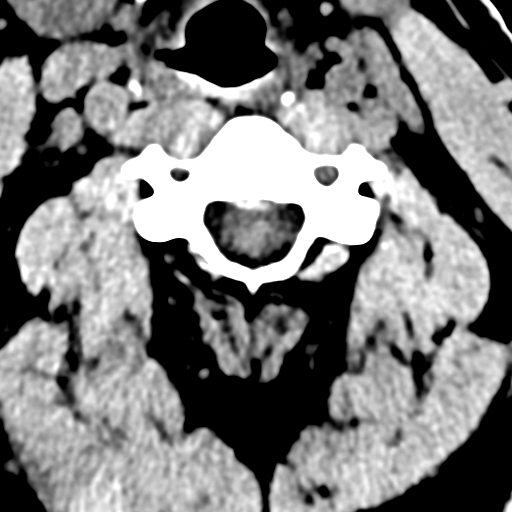
[im 62/86  brain]
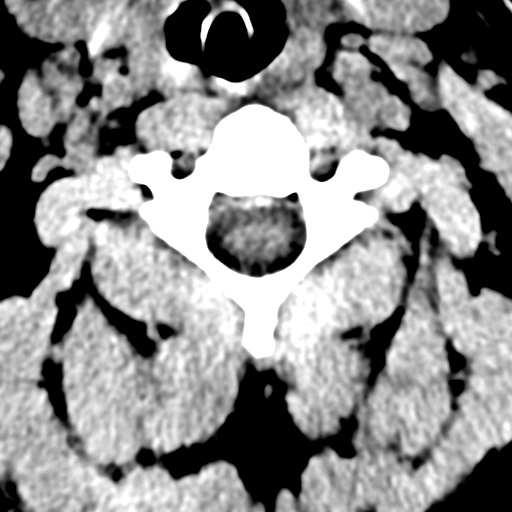
[im 70/86  brain]
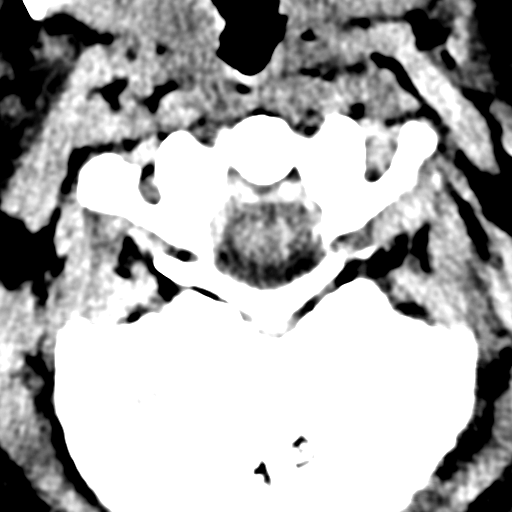
[im 78/86  brain]
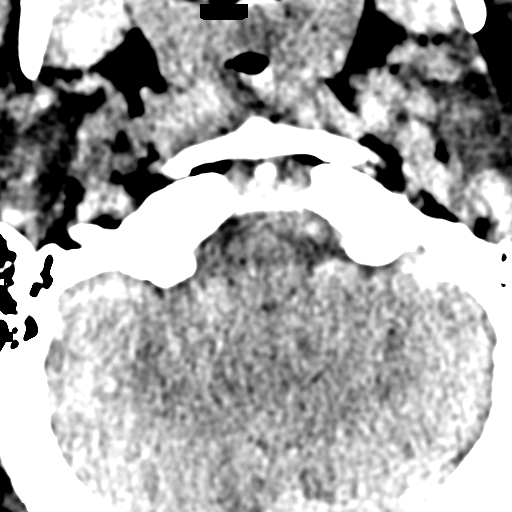
[im 78/86  bone]
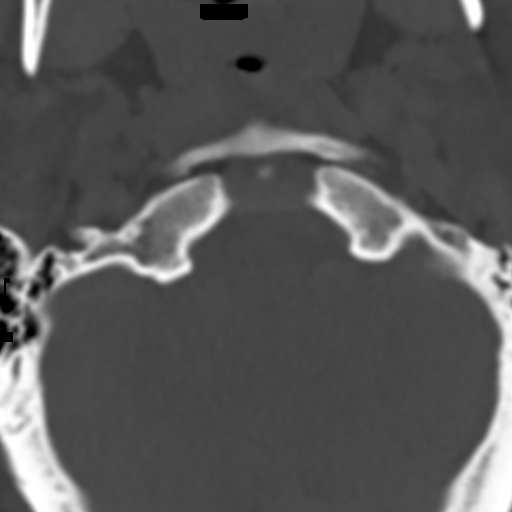

[14 of 47 positions shown; findings below may reference images not displayed]

FINDINGS: CT HEAD FINDINGS

Brain: No acute territorial infarction or intracranial hemorrhage is
visualized. There is no intracranial mass. The ventricles are
nonenlarged.

Vascular: No hyperdense vessel or unexpected calcification.

Skull: No fracture.

Sinuses/Orbits: No acute finding.

Other: Large right parietal and posterior frontal scalp laceration
and hematoma with soft tissue air bubbles. There is a a approximate
1.8 x 6.1 cm triangular-shaped foreign body within the scalp soft
tissues suspicious for retained glass. Multiple smaller retained
foreign bodies are visualized at the posterior, inferior margin of
the dominant foreign body.

CT CERVICAL SPINE FINDINGS

Alignment: Straightening of the cervical spine. No subluxation.
Facet alignment within normal limits.

Skull base and vertebrae: No acute fracture. No primary bone lesion
or focal pathologic process.

Soft tissues and spinal canal: No prevertebral fluid or swelling. No
visible canal hematoma.

Disc levels: No significant disc space narrowing or foraminal
stenosis.

Upper chest: Thyroid normal.  Lung apices clear.

Other: Skin thickening and subcutaneous edema posteriorly.
IMPRESSION: 1. No CT evidence for acute intracranial abnormality
2. Large right parietal and posterior frontal scalp laceration and
hematoma. Triangular-shaped foreign body measuring at least 6.1 cm
within the right scalp soft tissues suspicious for glass fragment.
Multiple smaller foreign bodies are visualized along the posterior,
inferior aspect of the dominant foreign body.
3. Straightening of the cervical spine. No acute fracture or
malalignment. Posterior skin thickening and subcutaneous edema.

## 2022-06-23 ENCOUNTER — Inpatient Hospital Stay (HOSPITAL_COMMUNITY)
Admission: EM | Admit: 2022-06-23 | Discharge: 2022-07-13 | DRG: 854 | Disposition: A | Discharge status: Home Health | Payer: No Typology Code available for payment source | Attending: Internal Medicine | Admitting: Internal Medicine

## 2022-06-23 ENCOUNTER — Ambulatory Visit
Admission: EM | Admit: 2022-06-23 | Discharge: 2022-06-23 | Payer: No Typology Code available for payment source | Attending: Internal Medicine | Admitting: Internal Medicine

## 2022-06-23 ENCOUNTER — Emergency Department (HOSPITAL_COMMUNITY): Payer: No Typology Code available for payment source

## 2022-06-23 ENCOUNTER — Encounter (HOSPITAL_COMMUNITY): Payer: Self-pay | Admitting: Emergency Medicine

## 2022-06-23 ENCOUNTER — Other Ambulatory Visit: Payer: Self-pay

## 2022-06-23 DIAGNOSIS — Z79899 Other long term (current) drug therapy: Secondary | ICD-10-CM

## 2022-06-23 DIAGNOSIS — R7401 Elevation of levels of liver transaminase levels: Secondary | ICD-10-CM | POA: Diagnosis present

## 2022-06-23 DIAGNOSIS — A429 Actinomycosis, unspecified: Secondary | ICD-10-CM | POA: Diagnosis not present

## 2022-06-23 DIAGNOSIS — Z91018 Allergy to other foods: Secondary | ICD-10-CM | POA: Diagnosis not present

## 2022-06-23 DIAGNOSIS — F1010 Alcohol abuse, uncomplicated: Secondary | ICD-10-CM | POA: Diagnosis present

## 2022-06-23 DIAGNOSIS — L732 Hidradenitis suppurativa: Secondary | ICD-10-CM | POA: Diagnosis present

## 2022-06-23 DIAGNOSIS — E1165 Type 2 diabetes mellitus with hyperglycemia: Secondary | ICD-10-CM | POA: Diagnosis present

## 2022-06-23 DIAGNOSIS — E861 Hypovolemia: Secondary | ICD-10-CM | POA: Diagnosis present

## 2022-06-23 DIAGNOSIS — E871 Hypo-osmolality and hyponatremia: Secondary | ICD-10-CM | POA: Diagnosis present

## 2022-06-23 DIAGNOSIS — Z6838 Body mass index (BMI) 38.0-38.9, adult: Secondary | ICD-10-CM

## 2022-06-23 DIAGNOSIS — L03313 Cellulitis of chest wall: Secondary | ICD-10-CM | POA: Diagnosis not present

## 2022-06-23 DIAGNOSIS — A419 Sepsis, unspecified organism: Secondary | ICD-10-CM

## 2022-06-23 DIAGNOSIS — M60009 Infective myositis, unspecified site: Secondary | ICD-10-CM | POA: Diagnosis not present

## 2022-06-23 DIAGNOSIS — L0291 Cutaneous abscess, unspecified: Secondary | ICD-10-CM

## 2022-06-23 DIAGNOSIS — Z720 Tobacco use: Secondary | ICD-10-CM | POA: Diagnosis present

## 2022-06-23 DIAGNOSIS — F419 Anxiety disorder, unspecified: Secondary | ICD-10-CM | POA: Diagnosis present

## 2022-06-23 DIAGNOSIS — M6008 Infective myositis, other site: Secondary | ICD-10-CM | POA: Diagnosis not present

## 2022-06-23 DIAGNOSIS — Z20822 Contact with and (suspected) exposure to covid-19: Secondary | ICD-10-CM | POA: Diagnosis present

## 2022-06-23 DIAGNOSIS — M726 Necrotizing fasciitis: Secondary | ICD-10-CM | POA: Diagnosis not present

## 2022-06-23 DIAGNOSIS — E8770 Fluid overload, unspecified: Secondary | ICD-10-CM | POA: Diagnosis present

## 2022-06-23 DIAGNOSIS — L02213 Cutaneous abscess of chest wall: Secondary | ICD-10-CM | POA: Diagnosis present

## 2022-06-23 DIAGNOSIS — N611 Abscess of the breast and nipple: Secondary | ICD-10-CM | POA: Diagnosis not present

## 2022-06-23 DIAGNOSIS — M7989 Other specified soft tissue disorders: Secondary | ICD-10-CM | POA: Diagnosis present

## 2022-06-23 DIAGNOSIS — Z833 Family history of diabetes mellitus: Secondary | ICD-10-CM

## 2022-06-23 DIAGNOSIS — F17210 Nicotine dependence, cigarettes, uncomplicated: Secondary | ICD-10-CM | POA: Diagnosis present

## 2022-06-23 DIAGNOSIS — K047 Periapical abscess without sinus: Secondary | ICD-10-CM | POA: Diagnosis present

## 2022-06-23 DIAGNOSIS — R739 Hyperglycemia, unspecified: Secondary | ICD-10-CM | POA: Diagnosis present

## 2022-06-23 DIAGNOSIS — D509 Iron deficiency anemia, unspecified: Secondary | ICD-10-CM | POA: Diagnosis present

## 2022-06-23 DIAGNOSIS — J45909 Unspecified asthma, uncomplicated: Secondary | ICD-10-CM | POA: Diagnosis present

## 2022-06-23 DIAGNOSIS — Z794 Long term (current) use of insulin: Secondary | ICD-10-CM | POA: Diagnosis not present

## 2022-06-23 LAB — CBC WITH DIFFERENTIAL/PLATELET
Abs Immature Granulocytes: 0.2 10*3/uL — ABNORMAL HIGH (ref 0.00–0.07)
Basophils Absolute: 0.1 10*3/uL (ref 0.0–0.1)
Basophils Relative: 0 %
Eosinophils Absolute: 0 10*3/uL (ref 0.0–0.5)
Eosinophils Relative: 0 %
HCT: 45.2 % (ref 39.0–52.0)
Hemoglobin: 15.8 g/dL (ref 13.0–17.0)
Immature Granulocytes: 1 %
Lymphocytes Relative: 6 %
Lymphs Abs: 1.3 10*3/uL (ref 0.7–4.0)
MCH: 27.7 pg (ref 26.0–34.0)
MCHC: 35 g/dL (ref 30.0–36.0)
MCV: 79.2 fL — ABNORMAL LOW (ref 80.0–100.0)
Monocytes Absolute: 1.4 10*3/uL — ABNORMAL HIGH (ref 0.1–1.0)
Monocytes Relative: 7 %
Neutro Abs: 18.2 10*3/uL — ABNORMAL HIGH (ref 1.7–7.7)
Neutrophils Relative %: 86 %
Platelets: 311 10*3/uL (ref 150–400)
RBC: 5.71 MIL/uL (ref 4.22–5.81)
RDW: 12.3 % (ref 11.5–15.5)
WBC: 21.2 10*3/uL — ABNORMAL HIGH (ref 4.0–10.5)
nRBC: 0 % (ref 0.0–0.2)

## 2022-06-23 LAB — BASIC METABOLIC PANEL
Anion gap: 14 (ref 5–15)
BUN: 8 mg/dL (ref 6–20)
CO2: 22 mmol/L (ref 22–32)
Calcium: 8.6 mg/dL — ABNORMAL LOW (ref 8.9–10.3)
Chloride: 92 mmol/L — ABNORMAL LOW (ref 98–111)
Creatinine, Ser: 1.06 mg/dL (ref 0.61–1.24)
GFR, Estimated: >60 mL/min (ref >60)
Glucose, Bld: 365 mg/dL — ABNORMAL HIGH (ref 70–99)
Potassium: 4.2 mmol/L (ref 3.5–5.1)
Sodium: 128 mmol/L — ABNORMAL LOW (ref 135–145)

## 2022-06-23 LAB — CBG MONITORING, ED
Glucose-Capillary: 344 mg/dL — ABNORMAL HIGH (ref 70–99)
Glucose-Capillary: 298 mg/dL — ABNORMAL HIGH (ref 70–99)

## 2022-06-23 LAB — LACTIC ACID, PLASMA: Lactic Acid, Venous: 1.9 mmol/L (ref 0.5–1.9)

## 2022-06-23 MED ORDER — VANCOMYCIN HCL 1500 MG/300ML IV SOLN
1500.0000 mg | Freq: Once | INTRAVENOUS | Status: AC
  Administered 2022-06-23: 1500 mg via INTRAVENOUS
  Filled 2022-06-23: qty 300

## 2022-06-23 MED ORDER — METRONIDAZOLE 500 MG/100ML IV SOLN
500.0000 mg | Freq: Two times a day (BID) | INTRAVENOUS | Status: DC
  Administered 2022-06-23 – 2022-06-27 (×9): 500 mg via INTRAVENOUS
  Filled 2022-06-23 (×8): qty 100

## 2022-06-23 MED ORDER — SODIUM CHLORIDE 0.9 % IV SOLN: INTRAVENOUS | Status: AC

## 2022-06-23 MED ORDER — LACTATED RINGERS IV BOLUS (SEPSIS)
1000.0000 mL | Freq: Once | INTRAVENOUS | Status: AC
  Administered 2022-06-23: 1000 mL via INTRAVENOUS

## 2022-06-23 MED ORDER — SODIUM CHLORIDE 0.9 % IV SOLN
2.0000 g | Freq: Once | INTRAVENOUS | Status: AC
  Administered 2022-06-23: 2 g via INTRAVENOUS
  Filled 2022-06-23: qty 12.5

## 2022-06-23 MED ORDER — OXYCODONE HCL 5 MG PO TABS
5.0000 mg | ORAL_TABLET | ORAL | Status: DC | PRN
  Administered 2022-06-23 – 2022-06-30 (×20): 5 mg via ORAL
  Filled 2022-06-23 (×20): qty 1

## 2022-06-23 MED ORDER — SODIUM CHLORIDE 0.9 % IV SOLN
2.0000 g | Freq: Three times a day (TID) | INTRAVENOUS | Status: DC
  Administered 2022-06-24 – 2022-06-28 (×13): 2 g via INTRAVENOUS
  Filled 2022-06-23 (×13): qty 12.5

## 2022-06-23 MED ORDER — ONDANSETRON HCL 4 MG PO TABS: 4.0000 mg | ORAL_TABLET | Freq: Four times a day (QID) | ORAL | Status: DC | PRN

## 2022-06-23 MED ORDER — ACETAMINOPHEN 500 MG PO TABS: 1000.0000 mg | ORAL_TABLET | Freq: Once | ORAL | Status: AC

## 2022-06-23 MED ORDER — FOLIC ACID 1 MG PO TABS
1.0000 mg | ORAL_TABLET | Freq: Every day | ORAL | Status: DC
  Administered 2022-06-23 – 2022-07-13 (×19): 1 mg via ORAL
  Filled 2022-06-23 (×20): qty 1

## 2022-06-23 MED ORDER — INSULIN ASPART 100 UNIT/ML IJ SOLN
0.0000 [IU] | Freq: Three times a day (TID) | INTRAMUSCULAR | Status: DC
  Administered 2022-06-24: 3 [IU] via SUBCUTANEOUS
  Administered 2022-06-24 (×2): 8 [IU] via SUBCUTANEOUS
  Administered 2022-06-25: 5 [IU] via SUBCUTANEOUS
  Administered 2022-06-25: 3 [IU] via SUBCUTANEOUS
  Administered 2022-06-25: 5 [IU] via SUBCUTANEOUS
  Administered 2022-06-26 – 2022-06-27 (×5): 3 [IU] via SUBCUTANEOUS
  Administered 2022-06-27 – 2022-06-28 (×2): 5 [IU] via SUBCUTANEOUS
  Administered 2022-06-28 – 2022-06-30 (×5): 3 [IU] via SUBCUTANEOUS
  Administered 2022-06-30 – 2022-07-01 (×4): 2 [IU] via SUBCUTANEOUS
  Administered 2022-07-02 – 2022-07-03 (×4): 3 [IU] via SUBCUTANEOUS
  Administered 2022-07-03: 5 [IU] via SUBCUTANEOUS
  Administered 2022-07-04 (×2): 2 [IU] via SUBCUTANEOUS
  Administered 2022-07-04 – 2022-07-05 (×3): 3 [IU] via SUBCUTANEOUS
  Administered 2022-07-05: 5 [IU] via SUBCUTANEOUS
  Administered 2022-07-06 (×3): 3 [IU] via SUBCUTANEOUS
  Administered 2022-07-07: 5 [IU] via SUBCUTANEOUS
  Administered 2022-07-07 (×2): 3 [IU] via SUBCUTANEOUS
  Administered 2022-07-08 (×3): 2 [IU] via SUBCUTANEOUS
  Administered 2022-07-09: 3 [IU] via SUBCUTANEOUS
  Administered 2022-07-09 (×2): 5 [IU] via SUBCUTANEOUS
  Administered 2022-07-10 (×2): 3 [IU] via SUBCUTANEOUS
  Administered 2022-07-10 – 2022-07-11 (×2): 2 [IU] via SUBCUTANEOUS
  Administered 2022-07-11: 3 [IU] via SUBCUTANEOUS
  Administered 2022-07-11: 5 [IU] via SUBCUTANEOUS
  Administered 2022-07-12 (×2): 3 [IU] via SUBCUTANEOUS
  Administered 2022-07-13 (×2): 2 [IU] via SUBCUTANEOUS
  Filled 2022-06-23: qty 1

## 2022-06-23 MED ORDER — NICOTINE 14 MG/24HR TD PT24
14.0000 mg | MEDICATED_PATCH | Freq: Every day | TRANSDERMAL | Status: DC
  Administered 2022-06-23 – 2022-07-13 (×20): 14 mg via TRANSDERMAL
  Filled 2022-06-23 (×21): qty 1

## 2022-06-23 MED ORDER — LORAZEPAM 2 MG/ML IJ SOLN: 1.0000 mg | INTRAMUSCULAR | Status: AC | PRN

## 2022-06-23 MED ORDER — THIAMINE MONONITRATE 100 MG PO TABS
100.0000 mg | ORAL_TABLET | Freq: Every day | ORAL | Status: DC
  Administered 2022-06-23 – 2022-07-13 (×20): 100 mg via ORAL
  Filled 2022-06-23 (×20): qty 1

## 2022-06-23 MED ORDER — VANCOMYCIN HCL 1250 MG/250ML IV SOLN
1250.0000 mg | Freq: Two times a day (BID) | INTRAVENOUS | Status: DC
  Administered 2022-06-24 – 2022-06-29 (×11): 1250 mg via INTRAVENOUS
  Filled 2022-06-23 (×13): qty 250

## 2022-06-23 MED ORDER — ACETAMINOPHEN 650 MG RE SUPP: 650.0000 mg | Freq: Four times a day (QID) | RECTAL | Status: DC | PRN

## 2022-06-23 MED ORDER — LACTATED RINGERS IV SOLN: INTRAVENOUS | Status: DC

## 2022-06-23 MED ORDER — INSULIN ASPART 100 UNIT/ML IJ SOLN
0.0000 [IU] | Freq: Every day | INTRAMUSCULAR | Status: DC
  Administered 2022-06-23: 3 [IU] via SUBCUTANEOUS
  Administered 2022-06-25 – 2022-07-11 (×5): 2 [IU] via SUBCUTANEOUS

## 2022-06-23 MED ORDER — ACETAMINOPHEN 325 MG PO TABS
650.0000 mg | ORAL_TABLET | Freq: Once | ORAL | Status: AC
  Administered 2022-06-23: 650 mg via ORAL

## 2022-06-23 MED ORDER — ACETAMINOPHEN 325 MG PO TABS
650.0000 mg | ORAL_TABLET | Freq: Four times a day (QID) | ORAL | Status: DC | PRN
  Administered 2022-06-24 – 2022-06-26 (×7): 650 mg via ORAL
  Filled 2022-06-23 (×7): qty 2

## 2022-06-23 MED ORDER — THIAMINE HCL 100 MG/ML IJ SOLN
100.0000 mg | Freq: Every day | INTRAMUSCULAR | Status: DC
  Filled 2022-06-23: qty 2

## 2022-06-23 MED ORDER — IBUPROFEN 400 MG PO TABS
400.0000 mg | ORAL_TABLET | Freq: Once | ORAL | Status: AC
  Administered 2022-06-23: 400 mg via ORAL
  Filled 2022-06-23: qty 1

## 2022-06-23 MED ORDER — SODIUM CHLORIDE 0.9 % IV SOLN
2.0000 g | Freq: Once | INTRAVENOUS | Status: AC
  Administered 2022-06-23: 2 g via INTRAVENOUS
  Filled 2022-06-23: qty 20

## 2022-06-23 MED ORDER — ADULT MULTIVITAMIN W/MINERALS CH
1.0000 | ORAL_TABLET | Freq: Every day | ORAL | Status: DC
  Administered 2022-06-23 – 2022-07-13 (×20): 1 via ORAL
  Filled 2022-06-23 (×20): qty 1

## 2022-06-23 MED ORDER — ENOXAPARIN SODIUM 40 MG/0.4ML IJ SOSY
40.0000 mg | PREFILLED_SYRINGE | INTRAMUSCULAR | Status: DC
  Administered 2022-06-23 – 2022-06-25 (×3): 40 mg via SUBCUTANEOUS
  Filled 2022-06-23 (×3): qty 0.4

## 2022-06-23 MED ORDER — ACETAMINOPHEN 500 MG PO TABS
ORAL_TABLET | ORAL | Status: AC
  Administered 2022-06-23: 1000 mg via ORAL
  Filled 2022-06-23: qty 2

## 2022-06-23 MED ORDER — IOHEXOL 350 MG/ML SOLN
50.0000 mL | Freq: Once | INTRAVENOUS | Status: AC | PRN
  Administered 2022-06-23: 50 mL via INTRAVENOUS

## 2022-06-23 MED ORDER — VANCOMYCIN HCL IN DEXTROSE 1-5 GM/200ML-% IV SOLN
1000.0000 mg | Freq: Once | INTRAVENOUS | Status: AC
  Administered 2022-06-23: 1000 mg via INTRAVENOUS
  Filled 2022-06-23: qty 200

## 2022-06-23 MED ORDER — LORAZEPAM 1 MG PO TABS: 1.0000 mg | ORAL_TABLET | ORAL | Status: AC | PRN

## 2022-06-23 MED ORDER — ONDANSETRON HCL 4 MG/2ML IJ SOLN
4.0000 mg | Freq: Four times a day (QID) | INTRAMUSCULAR | Status: DC | PRN
  Administered 2022-06-27 – 2022-07-03 (×5): 4 mg via INTRAVENOUS
  Filled 2022-06-23 (×4): qty 2

## 2022-06-23 MED ORDER — LACTATED RINGERS IV BOLUS (SEPSIS)
200.0000 mL | Freq: Once | INTRAVENOUS | Status: AC
  Administered 2022-06-23: 200 mL via INTRAVENOUS

## 2022-06-23 NOTE — ED Triage Notes (Signed)
Pt c/o abscess to left chest that causing severe pain, fever, headache, vomiting, dizziness, malaise. Has missed work x 2 days. Onset ~ Friday.   Reports hx of abscesses. Had one recently that spontaneously drained and resolved.

## 2022-06-23 NOTE — Progress Notes (Signed)
Per ED RN first set of blood cultures drawn @ 1353. Antibiotics were given before 2nd set drawn

## 2022-06-23 NOTE — ED Provider Notes (Signed)
[facility] EMERGENCY DEPARTMENT Provider Note   CSN: [id number] Arrival date & time: 06/23/22  1326     History  Chief Complaint  Patient presents with   Abscess    [name] is a 36 y.o. male.  With no significant past medical history presenting with concerns for "boil" and fever.  Patient was transferred from [facility] urgent care.  He presented there for evaluation of an abscess on his left chest area.  He states he first noticed this about a week ago.  He states that it has gotten progressively worse over the last couple of days.  Since Tuesday he has had chills, nausea, vomiting, increased pain, lightheadedness.  He states he felt so poorly this morning up to call out from work.  He has had nausea and vomiting today.  He thinks he has had fever at home.  On chart review, patient was febrile to 103 at urgent care.   Abscess      Home Medications Prior to Admission medications   Medication Sig Start Date End Date Taking? Authorizing Provider  doxycycline (VIBRAMYCIN) 100 MG capsule Take 1 capsule (100 mg total) by mouth 2 (two) times daily. 09/16/16   Gilda Crease, MD  naproxen (NAPROSYN) 500 MG tablet Take 1 tablet (500 mg total) by mouth 2 (two) times daily. 08/07/16   Cheri Fowler, PA-C      Allergies    Patient has no known allergies.    Review of Systems   Review of Systems  Physical Exam Updated Vital Signs BP (!) 140/80 (BP Location: Right Arm)   Pulse (!) 124   Temp 98.6 F (37 C) (Oral)   Resp 13   Ht 5\' 9"  (1.753 m)   Wt 111.1 kg   SpO2 98%   BMI 36.18 kg/m  Physical Exam HENT:     Head: Normocephalic and atraumatic.     Nose: Nose normal.  Eyes:     Extraocular Movements: Extraocular movements intact.     Conjunctiva/sclera: Conjunctivae normal.  Cardiovascular:     Rate and Rhythm: Regular rhythm. Tachycardia present.     Pulses: Normal pulses.  Pulmonary:     Effort: Pulmonary effort is normal.  Abdominal:      General: There is no distension.     Tenderness: There is no abdominal tenderness. There is no guarding or rebound.  Skin:    Comments: Very large (approximately 10 inches x 3 inches) erythematous and tender region along left chest/breast that is indurated and tender to palpation  Neurological:     Mental Status: He is alert.     ED Results / Procedures / Treatments   Labs (all labs ordered are listed, but only abnormal results are displayed) Labs Reviewed  CULTURE, BLOOD (ROUTINE X 2)  CULTURE, BLOOD (ROUTINE X 2)  CBC WITH DIFFERENTIAL/PLATELET  BASIC METABOLIC PANEL  LACTIC ACID, PLASMA    EKG EKG Interpretation  Date/Time:  Thursday June 23 2022 13:28:34 EST Ventricular Rate:  140 PR Interval:  130 QRS Duration: 84 QT Interval:  275 QTC Calculation: 420 R Axis:   69 Text Interpretation: Sinus tachycardia Borderline T wave abnormalities Confirmed by [birth year] [phone]) on 06/23/2022 1:30:03 PM  Radiology No results found.  Procedures Procedures    Medications Ordered in ED Medications - No data to display  ED Course/ Medical Decision Making/ A&P  Medical Decision Making Patient presents as above.  He has a large cellulitic area on his left chest wall/breast that likely has underlying abscess.  With his systemic symptoms, fever at urgent care, tachycardia here, code sepsis activated.  Fluid bolus, IV antibiotics, cultures, lactic acid, CBC, CMP ordered.  Due to extensive size of likely abscess, CT chest with contrast ordered.   Amount and/or Complexity of Data Reviewed Labs: ordered. Radiology: ordered. ECG/medicine tests: ordered.  Risk Prescription drug management.   Labs notable for leukocytosis at 21.2, hemoglobin and platelets within normal limits.  Neutrophils grossly elevated.  BMP notable for elevated blood sugar at 365, sodium 128 (within the limits when corrected for hyperglycemia), no anion gap, potassium within  normal limits.  Bicarb normal.  Lactic acid within normal limits.  EKG showed sinus tachycardia, no ST elevations, no ST depressions, consistent with sinus tachycardia likely secondary to his infectious process.  Plan at the time of signout to follow-up on patient's CT scan, patient may require surgical consult.  He will require admission for his sepsis.  Please see oncoming team's note for remainder patient care.  Care was handed off to Dr. Lockie Mola.          Final Clinical Impression(s) / ED Diagnoses Final diagnoses:  None    Rx / DC Orders ED Discharge Orders     None         Linward Foster, MD 06/23/22 1540    Blane Ohara, MD 06/25/22 1513

## 2022-06-23 NOTE — ED Provider Notes (Signed)
Assumed care of him at 3 PM.  Here with cellulitis to the left chest wall.  Has white count of 21, fever, tachycardia.  Sepsis workup initiated.  Blood sugar in the 360s.  CT scan is pending.  CT of the chest mostly shows cellulitic changes.  Given clinical history I suspect that this is an infectious space and not malignancy based.  Nothing surgical at this time as there is no drainable abscess.  I did talk with general surgery but they can be available if needed.  Admitted to medicine for further care.  This chart was dictated using voice recognition software.  Despite best efforts to proofread,  errors can occur which can change the documentation meaning.    Virgina Norfolk, DO 06/23/22 1928

## 2022-06-23 NOTE — H&P (Signed)
[facility] History and Physical  YOSSEF GILKISON ZDG:[id number] DOB: [birth year] DOA: 06/23/2022   PCP: Patient, [name]  Specialists: None  Chief Complaint: Pain over left chest area along with swelling  HPI: [name] is a 36 y.o. male with no known past medical history who presented with complaints of pain and swelling involving the left chest area.  He also developed fever chills.  Nausea vomiting.  Missed 2 days of work.  Patient reports that he has history of boils especially in his armpit area but nothing has been as severe as this particular infection.  Never had infection in the chest area previously.  Pain is currently 8 out of 10 in intensity.  Sharp pain without any radiation.  Associated symptoms as mentioned above.  Denies any drainage from the area of concern.  Denies any injuries to that area.  He has never been diagnosed with diabetes previously.  Denies any weight loss but he does admit he has gained some weight over the last few months.  In the emergency department patient was found to have swelling and erythema of the left chest area.  CT scan did not show any abscess.  Patient with IV antibiotics.  He will be hospitalized for further management.  Home Medications: Prior to Admission medications   Medication Sig Start Date End Date Taking? Authorizing Provider  ibuprofen (ADVIL) 200 MG tablet Take 200 mg by mouth every 6 (six) hours as needed for mild pain.   Yes [provider]  naproxen (NAPROSYN) 500 MG tablet Take 1 tablet (500 mg total) by mouth 2 (two) times daily. Patient not taking: Reported on 06/23/2022 08/07/16   Cheri Fowler, PA-C    Allergies: No Known Allergies  Past Medical History: Past Medical History:  Diagnosis Date   Asthma     History reviewed. No pertinent surgical history.  Social History: Smokes 1 pack of cigarettes on a daily basis.  Drinks about 4 beers on a daily basis and drinks checked annuals over the weekend.  No  cocaine use.   Family History:  Denies any health problems in the family.  Review of Systems - History obtained from the patient General ROS: positive for  - chills, fatigue, and fever Psychological ROS: negative Ophthalmic ROS: negative ENT ROS: negative Allergy and Immunology ROS: negative Hematological and Lymphatic ROS: negative Endocrine ROS: negative Breast ROS: positive for - swelling over left breast.  Denies any discharge. Respiratory ROS: no cough, shortness of breath, or wheezing Cardiovascular ROS: no chest pain or dyspnea on exertion Gastrointestinal ROS: no abdominal pain, change in bowel habits, or black or bloody stools Genito-Urinary ROS: no dysuria, trouble voiding, or hematuria Musculoskeletal ROS: negative Neurological ROS: no TIA or stroke symptoms Dermatological ROS: negative  Physical Examination  Vitals:   06/23/22 1900 06/23/22 2008 06/23/22 2009 06/23/22 2010  BP:    131/87  Pulse: (!) 107 (!) 121 (!) 121 (!) 117  Resp:  20 14 17   Temp:      TempSrc:      SpO2: 97% 98% 98% 100%  Weight:      Height:        BP 131/87   Pulse (!) 117   Temp (!) 100.5 F (38.1 C) (Oral)   Resp 17   Ht 5\' 9"  (1.753 m)   Wt 111.1 kg   SpO2 100%   BMI 36.18 kg/m   General appearance: alert, cooperative, appears stated age, and no distress Head: Normocephalic, without  obvious abnormality, atraumatic Eyes: conjunctivae/corneas clear. PERRL, EOM's intact.  Throat: lips, mucosa, and tongue normal; teeth and gums normal Neck: no adenopathy, no carotid bruit, no JVD, supple, symmetrical, trachea midline, and thyroid not enlarged, symmetric, no tenderness/mass/nodules Resp: clear to auscultation bilaterally Patient found to have significant swelling and erythema involving the left chest area.  Very to touch.  No drainage noted. Cardio: regular rate and rhythm, S1, S2 normal, no murmur, click, rub or gallop GI: soft, non-tender; bowel sounds normal; no masses,  no  organomegaly Extremities: extremities normal, atraumatic, no cyanosis or edema Pulses: 2+ and symmetric Skin: Skin color, texture, turgor normal. No rashes or lesions Lymph nodes: Axillary lymphadenopathy noted on the left Neurologic: Alert and oriented x 3.  Cranial nerves II to XII intact.  Motor strength equal bilateral upper and lower extremities.  Labs on Admission: I have personally reviewed following labs and imaging studies  CBC: Recent Labs  Lab 06/23/22 1348  WBC 21.2*  NEUTROABS 18.2*  HGB 15.8  HCT 45.2  MCV 79.2*  PLT [id number]   Basic Metabolic Panel: Recent Labs  Lab 06/23/22 1348  NA 128*  K 4.2  CL 92*  CO2 22  GLUCOSE 365*  BUN 8  CREATININE 1.06  CALCIUM 8.6*   GFR: Estimated Creatinine Clearance: 116.1 mL/min (by C-G formula based on SCr of 1.06 mg/dL).  CBG: Recent Labs  Lab 06/23/22 1534  GLUCAP 344*     Radiological Exams on Admission: CT Chest W Contrast  Result Date: 06/23/2022 CLINICAL DATA:  Chest wall pain, nontraumatic, infection or inflammation suspected, xray done EXAM: CT CHEST WITH CONTRAST TECHNIQUE: Multidetector CT imaging of the chest was performed during intravenous contrast administration. RADIATION DOSE REDUCTION: This exam was performed according to the departmental dose-optimization program which includes automated exposure control, adjustment of the mA and/or kV according to patient size and/or use of iterative reconstruction technique. CONTRAST:  56mL OMNIPAQUE IOHEXOL 350 MG/ML SOLN COMPARISON:  None Available. FINDINGS: Cardiovascular: Normal heart size. No significant pericardial effusion. The thoracic aorta is normal in caliber. No atherosclerotic plaque of the thoracic aorta. No coronary artery calcifications. Mediastinum/Nodes: Borderline enlarged left axillary lymph nodes. No enlarged mediastinal, hilar, or right axillary lymph nodes. Thyroid gland, trachea, and esophagus demonstrate no significant findings. Lungs/Pleura: No  focal consolidation. No pulmonary nodule. No pulmonary mass. No pleural effusion. No pneumothorax. Upper Abdomen: No acute abnormality. Musculoskeletal: Collimated off view left breast with associated dermal thickening and subcutaneus soft tissue edema. No suspicious lytic or blastic osseous lesions. No acute displaced fracture. Bulky osteophyte formation of the T11-T12 levels. IMPRESSION: 1. Collimated off view left breast with associated dermal thickening and subcutaneus soft tissue edema. Finding could represent infection versus malignancy. 2. Borderline enlarged left axillary lymph nodes. Consider diagnostic mammography/ultrasound for further evaluation. 3. No acute intrathoracic abnormality. Electronically Signed   By: Iven Finn M.D.   On: 06/23/2022 17:47    My interpretation of Electrocardiogram: Sinus tachycardia 140 bpm.  Normal axis.  Intervals appear to be normal.  Nonspecific T wave changes.  No concerning ST segment changes.   Problem List  Principal Problem:   Cellulitis of chest wall Active Problems:   Hyperglycemia   Hyponatremia   Tobacco abuse   Alcohol abuse   Assessment: This is a 36 year old African-American male who does not have any chronic health problems who comes in with pain and swelling involving the left chest/breast area.  Peers to have severe cellulitis involving the left chest area.  No  abscess noted on CT scan.  He does report a history of boils that he has been getting since the age of 22.  He does have findings suggestive of hidradenitis in the left axillary region.  Plan:  #1. Left chest wall cellulitis, sepsis present on admission: There is no evidence for abscess noted on CT scan.  CT scan raised concern for infection versus malignancy.  Presentation is most consistent with infection.  EDP discussed with Dr. Redmond Pulling with general surgery.  There is a note from him in the chart.  No fluctuation was appreciated on examination.  The area is quite  erythematous.  Will check MRSA PCR.  Follow-up on blood cultures.  His WBC is significantly elevated.  Lactic acid is normal.  Will check procalcitonin.  Will place him on broad-spectrum coverage for now with vancomycin cefepime and metronidazole and de-escalate depending on clinical improvement and results of tests.  With sepsis at presentation with tachycardia and leukocytosis low-grade fever source of infection.  #2. Hyperglycemia/newly diagnosed diabetes mellitus type 2: No known history of for diabetes.  Will check HbA1c.  Monitor CBGs.  May need to be seen by diabetes coordinator and will need education.  Treatment strategy will be based on glucose trends and HbA1c level.  #3. Hyponatremia: Likely due to hypovolemia and sepsis.  Give IV fluids.  Recheck labs in the morning.  #4.  Alcohol abuse: Patient was counseled.  CIWA protocol.  #5.  Tobacco abuse: Patient was counseled.  Nicotine patch.  DVT Prophylaxis: Lovenox Code Status: Full code Family Communication: Discussed with the patient Disposition: Hopefully return home when improved Consults called: None Admission Status: Status is: Inpatient Remains inpatient appropriate because: Severe cellulitis involving left chest wall requiring IV antibiotics    Severity of Illness: The appropriate patient status for this patient is INPATIENT. Inpatient status is judged to be reasonable and necessary in order to provide the required intensity of service to ensure the patient's safety. The patient's presenting symptoms, physical exam findings, and initial radiographic and laboratory data in the context of their chronic comorbidities is felt to place them at high risk for further clinical deterioration. Furthermore, it is not anticipated that the patient will be medically stable for discharge from the hospital within 2 midnights of admission.   * I certify that at the point of admission it is my clinical judgment that the patient will require  inpatient hospital care spanning beyond 2 midnights from the point of admission due to high intensity of service, high risk for further deterioration and high frequency of surveillance required.*   Further management decisions will depend on results of further testing and patient's response to treatment.   Arieona Swaggerty [employer]  [facility] operational officer on [email or web address]  06/23/2022, 8:48 PM

## 2022-06-23 NOTE — Progress Notes (Addendum)
Pharmacy Antibiotic Note  [name] is a 36 y.o. male admitted on 06/23/2022 with sepsis secondary to L chest/breast cellulitis and possible abscess.  Pharmacy has been consulted for Vancomycin dosing.  Ceftriaxone 2g IV x1 given in ED at 14:31 on 11/30 Vancomycin 1g IV x1 given in ED at 15:07 on 11/30  WBC 21.2, Tmax 103, HR 120-150s, RR 13-21, LA 1.9 SCr 1.06 (baseline unknown)  Addendum  Dr. Rito Ehrlich would like to change ceftriaxone to cefepime for now and narrow as needed. Plan: Give additional vancomycin 1.5g for a total of 2.5g IV x1 loading dose , followed by  Vancomycin 1250mg  IV q12h (eAUC ~543)    > Goal AUC 400-550    > Check vancomycin levels at steady state  Cefepime 2g IV q8 Monitor daily CBC, temp, SCr, and for clinical signs of improvement  F/u cultures and de-escalate antibiotics as able   Height: 5\' 9"  (175.3 cm) Weight: 111.1 kg (245 lb) IBW/kg (Calculated) : 70.7  Temp (24hrs), Avg:100.8 F (38.2 C), Min:98.6 F (37 C), Max:103 F (39.4 C)  Recent Labs  Lab 06/23/22 1340 06/23/22 1348  WBC  --  21.2*  CREATININE  --  1.06  LATICACIDVEN 1.9  --     Estimated Creatinine Clearance: 116.1 mL/min (by C-G formula based on SCr of 1.06 mg/dL).    No Known Allergies  Antimicrobials this admission: Ceftriaxone 11/30 x1 Cefepime 11/30>> Vancomycin 11/30 >>  Flagyl 11/30>>  Dose adjustments this admission: N/A  Microbiology results: 11/30 BCx: pending  12/30, PharmD, BCIDP, AAHIVP, CPP Infectious Disease Pharmacist 06/23/2022 8:40 PM

## 2022-06-23 NOTE — ED Notes (Signed)
Pt has a left axilla abscess that has taken over to left breast.  The area is red, solid and hot to touch.  Pt reports it is very painful and has been getting bigger the past 3 days.

## 2022-06-23 NOTE — ED Notes (Signed)
Patient transported to CT 

## 2022-06-23 NOTE — Progress Notes (Signed)
Elink following code sepsis °

## 2022-06-23 NOTE — ED Provider Notes (Signed)
[facility]    CSN: [id number] Arrival date & time: 06/23/22  1019      History   Chief Complaint Chief Complaint  Patient presents with   Abscess    HPI [name] is a 36 y.o. male.   Patient here today for evaluation of abscess to his left chest/ breast area that he first noticed about a week ago. He reports he has had boils in the past but none that seem this bad. He has had nausea and vomiting as well as significant fatigue. He reports he laid in bed most of  yesterday as a result. He does have fever. He has taken ibuprofen with last dose last night.   The history is provided by the patient.  Abscess Associated symptoms: fatigue, fever, nausea and vomiting     Past Medical History:  Diagnosis Date   Asthma     There are no problems to display for this patient.   History reviewed. No pertinent surgical history.     Home Medications    Prior to Admission medications   Medication Sig Start Date End Date Taking? Authorizing Provider  doxycycline (VIBRAMYCIN) 100 MG capsule Take 1 capsule (100 mg total) by mouth 2 (two) times daily. 09/16/16   Gilda Crease, MD  naproxen (NAPROSYN) 500 MG tablet Take 1 tablet (500 mg total) by mouth 2 (two) times daily. 08/07/16   Cheri Fowler, PA-C    Family History History reviewed. No pertinent family history.  Social History Social History   Tobacco Use   Smoking status: Every Day   Smokeless tobacco: Never  Substance Use Topics   Alcohol use: Yes    Alcohol/week: 13.0 standard drinks of alcohol    Types: 9 Cans of beer, 4 Shots of liquor per week   Drug use: Yes    Types: Marijuana     Allergies   Patient has no known allergies.   Review of Systems Review of Systems  Constitutional:  Positive for fatigue and fever.  Eyes:  Negative for discharge and redness.  Respiratory:  Negative for shortness of breath.   Gastrointestinal:  Positive for nausea and vomiting.  Skin:  Positive for  color change.  Neurological:  Negative for numbness.     Physical Exam Triage Vital Signs ED Triage Vitals [06/23/22 1219]  Enc Vitals Group     BP (!) 168/99     Pulse Rate (!) 150     Resp 16     Temp (!) 103 F (39.4 C)     Temp Source Oral     SpO2 97 %     Weight      Height      Head Circumference      Peak Flow      Pain Score 10     Pain Loc      Pain Edu?      Excl. in GC?    No data found.  Updated Vital Signs BP (!) 168/99 (BP Location: Left Arm)   Pulse (!) 150   Temp (!) 103 F (39.4 C) (Oral)   Resp 16   SpO2 97%     Physical Exam Vitals and nursing note reviewed.  Constitutional:      General: He is in acute distress.     Appearance: He is ill-appearing.     Comments: Appears to feel poorly  HENT:     Head: Normocephalic and atraumatic.  Eyes:     Conjunctiva/sclera:  Conjunctivae normal.  Cardiovascular:     Rate and Rhythm: Tachycardia present.  Pulmonary:     Effort: Pulmonary effort is normal. No respiratory distress.  Skin:    Comments: Significant swelling and erythema to left lateral chest/ breast area  Neurological:     Mental Status: He is alert.  Psychiatric:        Mood and Affect: Mood normal.        Behavior: Behavior normal.        Thought Content: Thought content normal.      UC Treatments / Results  Labs (all labs ordered are listed, but only abnormal results are displayed) Labs Reviewed - No data to display  EKG   Radiology No results found.  Procedures Procedures (including critical care time)  Medications Ordered in UC Medications  acetaminophen (TYLENOL) tablet 650 mg (650 mg Oral Given 06/23/22 1221)    Initial Impression / Assessment and Plan / UC Course  I have reviewed the triage vital signs and the nursing notes.  Pertinent labs & imaging results that were available during my care of the patient were reviewed by me and considered in my medical decision making (see chart for details).     Significant concern for sepsis given elevated temperature, tachycardia and known infection (abscess). Recommended further evaluation in the ED. Patient transported himself to UC- recommended EMS transport to ED. Patient is agreeable. [facility] to transport.   Final Clinical Impressions(s) / UC Diagnoses   Final diagnoses:  Sepsis, due to unspecified organism, unspecified whether acute organ dysfunction present [facility])  Abscess   Discharge Instructions   None    ED Prescriptions   None    PDMP not reviewed this encounter.   Tomi Bamberger, PA-C 06/23/22 1256

## 2022-06-23 NOTE — ED Triage Notes (Signed)
Pt BIB [facility] from urgent care due to abscess in the left chest axilla for the past week.  Pt reports fever.  HR tachy.  20g left hand.

## 2022-06-23 NOTE — ED Notes (Signed)
Ambulates to restroom without difficulty  

## 2022-06-23 NOTE — Progress Notes (Signed)
Asked by the EDP to take a look at this patient to see if there was evidence of abscess in the left pec/breast area.  Patient states multiple members of his family have diabetes but he has not been diagnosed.  Patient has a history of left axillary hidradenitis.  He said that he had some spontaneous drainage from his axilla the other week.  He states he has been having tenderness and discomfort in this left breast for about a week.  He does smoke.  Patient has evidence of old hidradenitis in the left axilla with no active disease.  The left breast along the lower part extending out laterally has significant cellulitis and induration.  I do not appreciate any soft tissue mass or abscess.  There is no fluctuance.  My recommendation was to start with IV antibiotics and see how he responds Check hemoglobin A1c Treat hyperglycemia  Can always call general surgery for formal consult if desired  Mary Sella. Andrey Campanile, MD, FACS General, Bariatric, & Minimally Invasive Surgery [facility],  [facility]

## 2022-06-23 NOTE — ED Notes (Signed)
Provider notified of the patient's heart rate

## 2022-06-23 NOTE — ED Notes (Signed)
Patient is being discharged from the Urgent Care and sent to the Emergency Department via EMS . Per Lurena Joiner, patient is in need of higher level of care due to possible sepsis. Patient is aware and verbalizes understanding of plan of care.  Vitals:   06/23/22 1219  BP: (!) 168/99  Pulse: (!) 150  Resp: 16  Temp: (!) 103 F (39.4 C)  SpO2: 97%

## 2022-06-24 DIAGNOSIS — L03313 Cellulitis of chest wall: Secondary | ICD-10-CM | POA: Diagnosis not present

## 2022-06-24 LAB — CBG MONITORING, ED
Glucose-Capillary: 263 mg/dL — ABNORMAL HIGH (ref 70–99)
Glucose-Capillary: 252 mg/dL — ABNORMAL HIGH (ref 70–99)

## 2022-06-24 LAB — CBC
HCT: 37.8 % — ABNORMAL LOW (ref 39.0–52.0)
Hemoglobin: 13 g/dL (ref 13.0–17.0)
MCH: 27.9 pg (ref 26.0–34.0)
MCHC: 34.4 g/dL (ref 30.0–36.0)
MCV: 81.1 fL (ref 80.0–100.0)
Platelets: 273 10*3/uL (ref 150–400)
RBC: 4.66 MIL/uL (ref 4.22–5.81)
RDW: 12.6 % (ref 11.5–15.5)
WBC: 22.2 10*3/uL — ABNORMAL HIGH (ref 4.0–10.5)
nRBC: 0 % (ref 0.0–0.2)

## 2022-06-24 LAB — BASIC METABOLIC PANEL
Anion gap: 8 (ref 5–15)
BUN: 7 mg/dL (ref 6–20)
CO2: 21 mmol/L — ABNORMAL LOW (ref 22–32)
Calcium: 8.1 mg/dL — ABNORMAL LOW (ref 8.9–10.3)
Chloride: 99 mmol/L (ref 98–111)
Creatinine, Ser: 0.98 mg/dL (ref 0.61–1.24)
GFR, Estimated: >60 mL/min (ref >60)
Glucose, Bld: 260 mg/dL — ABNORMAL HIGH (ref 70–99)
Potassium: 3.8 mmol/L (ref 3.5–5.1)
Sodium: 128 mmol/L — ABNORMAL LOW (ref 135–145)

## 2022-06-24 LAB — PROCALCITONIN: Procalcitonin: 0.21 ng/mL

## 2022-06-24 LAB — TSH: TSH: 3.788 u[IU]/mL (ref 0.350–4.500)

## 2022-06-24 LAB — CULTURE, BLOOD (ROUTINE X 2)
Culture: NO GROWTH
Culture: NO GROWTH

## 2022-06-24 LAB — MRSA NEXT GEN BY PCR, NASAL: MRSA by PCR Next Gen: NOT DETECTED

## 2022-06-24 LAB — GLUCOSE, CAPILLARY
Glucose-Capillary: 179 mg/dL — ABNORMAL HIGH (ref 70–99)
Glucose-Capillary: 192 mg/dL — ABNORMAL HIGH (ref 70–99)

## 2022-06-24 LAB — HIV ANTIBODY (ROUTINE TESTING W REFLEX): HIV Screen 4th Generation wRfx: NONREACTIVE

## 2022-06-24 LAB — MAGNESIUM: Magnesium: 1.9 mg/dL (ref 1.7–2.4)

## 2022-06-24 MED ORDER — KETOROLAC TROMETHAMINE 15 MG/ML IJ SOLN
15.0000 mg | Freq: Four times a day (QID) | INTRAMUSCULAR | Status: AC | PRN
  Administered 2022-06-24 – 2022-06-29 (×15): 15 mg via INTRAVENOUS
  Filled 2022-06-24 (×15): qty 1

## 2022-06-24 MED ORDER — INSULIN GLARGINE-YFGN 100 UNIT/ML ~~LOC~~ SOLN
15.0000 [IU] | Freq: Every day | SUBCUTANEOUS | Status: DC
  Administered 2022-06-24 – 2022-06-25 (×2): 15 [IU] via SUBCUTANEOUS
  Filled 2022-06-24 (×2): qty 0.15

## 2022-06-24 MED ORDER — INSULIN STARTER KIT- PEN NEEDLES (ENGLISH): 1.0000 | Freq: Once | Status: DC

## 2022-06-24 MED ORDER — LIVING WELL WITH DIABETES BOOK
Freq: Once | Status: AC
  Filled 2022-06-24: qty 1

## 2022-06-24 MED ORDER — KETOROLAC TROMETHAMINE 15 MG/ML IJ SOLN
15.0000 mg | Freq: Once | INTRAMUSCULAR | Status: AC
  Administered 2022-06-24: 15 mg via INTRAVENOUS
  Filled 2022-06-24: qty 1

## 2022-06-24 NOTE — Progress Notes (Signed)
[facility] night cross cover note:   I was notified by RN of the patient's elevated temp of 100.4, and that the patient is not yet eligible for his next dose of prn acetaminophen, having received his most recent dose of acetaminophen less than 4 hours ago.  He remains persistently tachycardic, without significant change over the last few hours, and normotensive.  The patient is also complaining of persistent chest wall discomfort, similar to that which brought him in leading to diagnosis of chest wall infection/cellulitis.  He currently has orders for prn oxycodone.  Per my chart review, renal function appears to be at baseline, and there does not appear to be any currently increased suspicion for gastrointestinal bleed.   Consequently, I placed a one-time order for Toradol 15 mg IV x1 dose now To attend to the patient's elevated temperature as well as his chest wall discomfort.      Newton Pigg, DO Hospitalist

## 2022-06-24 NOTE — ED Notes (Signed)
ED TO INPATIENT HANDOFF REPORT  ED Nurse Name and Phone #: [phone]  S Name/Age/Gender [name] 36 y.o. male Room/Bed: 036C/036C  Code Status   Code Status: Full Code  Home/SNF/Other Home Patient oriented to: self, place, time, and situation Is this baseline? Yes   Triage Complete: Triage complete  Chief Complaint Cellulitis of chest wall [L03.313]  Triage Note Pt BIB [facility] from urgent care due to abscess in the left chest axilla for the past week.  Pt reports fever.  HR tachy.  20g left hand.   Allergies No Known Allergies  Level of Care/Admitting Diagnosis ED Disposition     ED Disposition  Admit   Condition  --   Comment  Hospital Area: [facility] [[id number]]  Level of Care: Telemetry Medical [104]  May admit patient to [facility] or [facility] if equivalent level of care is available:: No  Covid Evaluation: Asymptomatic - no recent exposure (last 10 days) testing not required  Diagnosis: Cellulitis of chest wall [660630]  Admitting Physician: Osvaldo Shipper [[id number]]  Attending Physician: Osvaldo Shipper [[id number]]  Certification:: I certify this patient will need inpatient services for at least 2 midnights          B Medical/Surgery History Past Medical History:  Diagnosis Date   Asthma    History reviewed. No pertinent surgical history.   A IV Location/Drains/Wounds Patient Lines/Drains/Airways Status     Active Line/Drains/Airways     Name Placement date Placement time Site Days   Peripheral IV 06/23/22 20 G Left;Posterior Hand 06/23/22  1332  Hand  1   Peripheral IV 06/23/22 20 G Anterior;Proximal;Right Forearm 06/23/22  1340  Forearm  1   Wound / Incision (Open or Dehisced) 06/23/22 Other (Comment) Chest Left;Lower 06/23/22  2100  Chest  1            Intake/Output Last 24 hours  Intake/Output Summary (Last 24 hours) at 06/24/2022 1538 Last data filed at 06/24/2022 1601 Gross per 24 hour  Intake 663.48 ml  Output --   Net 663.48 ml    Labs/Imaging Results for orders placed or performed during the hospital encounter of 06/23/22 (from the past 48 hour(s))  MRSA Next Gen by PCR, Nasal     Status: None   Collection Time: 06/23/22 12:36 AM   Specimen: Nasal Mucosa; Nasal Swab  Result Value Ref Range   MRSA by PCR Next Gen NOT DETECTED NOT DETECTED    Comment: (NOTE) The GeneXpert MRSA Assay (FDA approved for NASAL specimens only), is one component of a comprehensive MRSA colonization surveillance program. It is not intended to diagnose MRSA infection nor to guide or monitor treatment for MRSA infections. Test performance is not FDA approved in patients less than 44 years old. Performed at [facility], [address], [place], [place] [address]   Lactic acid, plasma     Status: None   Collection Time: 06/23/22  1:40 PM  Result Value Ref Range   Lactic Acid, Venous 1.9 0.5 - 1.9 mmol/L    Comment: Performed at [facility], [address], [place], [place] [address]  Blood culture (routine x 2)     Status: None (Preliminary result)   Collection Time: 06/23/22  1:40 PM   Specimen: BLOOD RIGHT ARM  Result Value Ref Range   Specimen Description BLOOD RIGHT ARM    Special Requests      BOTTLES DRAWN AEROBIC AND ANAEROBIC Blood Culture results may not  be optimal due to an excessive volume of blood received in culture bottles   Culture      NO GROWTH < 24 HOURS Performed at [facility], [address] [place], [place] [address]    Report Status PENDING   CBC with Differential     Status: Abnormal   Collection Time: 06/23/22  1:48 PM  Result Value Ref Range   WBC 21.2 (H) 4.0 - 10.5 K/uL   RBC 5.71 4.22 - 5.81 MIL/uL   Hemoglobin 15.8 13.0 - 17.0 g/dL   HCT 57.845.2 46.939.0 - 62.952.0 %   MCV 79.2 (L) 80.0 - 100.0 fL   MCH 27.7 26.0 - 34.0 pg   MCHC 35.0 30.0 - 36.0 g/dL   RDW 52.812.3 41.311.5 - 24.415.5 %   Platelets 311 150 - 400 K/uL    Comment: REPEATED TO VERIFY   nRBC 0.0 0.0 - 0.2 %    Neutrophils Relative % 86 %   Neutro Abs 18.2 (H) 1.7 - 7.7 K/uL   Lymphocytes Relative 6 %   Lymphs Abs 1.3 0.7 - 4.0 K/uL   Monocytes Relative 7 %   Monocytes Absolute 1.4 (H) 0.1 - 1.0 K/uL   Eosinophils Relative 0 %   Eosinophils Absolute 0.0 0.0 - 0.5 K/uL   Basophils Relative 0 %   Basophils Absolute 0.1 0.0 - 0.1 K/uL   Immature Granulocytes 1 %   Abs Immature Granulocytes 0.20 (H) 0.00 - 0.07 K/uL    Comment: Performed at [facility], [address] [place], [place] [address]  Basic metabolic panel     Status: Abnormal   Collection Time: 06/23/22  1:48 PM  Result Value Ref Range   Sodium 128 (L) 135 - 145 mmol/L   Potassium 4.2 3.5 - 5.1 mmol/L   Chloride 92 (L) 98 - 111 mmol/L   CO2 22 22 - 32 mmol/L   Glucose, Bld 365 (H) 70 - 99 mg/dL    Comment: Glucose reference range applies only to samples taken after fasting for at least 8 hours.   BUN 8 6 - 20 mg/dL   Creatinine, Ser 2.531.06 0.61 - 1.24 mg/dL   Calcium 8.6 (L) 8.9 - 10.3 mg/dL   GFR, Estimated >66>60 >44>60 mL/min    Comment: (NOTE) Calculated using the CKD-EPI Creatinine Equation (2021)    Anion gap 14 5 - 15    Comment: Performed at [facility], [address] [place], [place] [address]  POC CBG, ED     Status: Abnormal   Collection Time: 06/23/22  3:34 PM  Result Value Ref Range   Glucose-Capillary 344 (H) 70 - 99 mg/dL    Comment: Glucose reference range applies only to samples taken after fasting for at least 8 hours.  Blood culture (routine x 2)     Status: None (Preliminary result)   Collection Time: 06/23/22  4:10 PM   Specimen: BLOOD  Result Value Ref Range   Specimen Description BLOOD SITE NOT SPECIFIED    Special Requests      BOTTLES DRAWN AEROBIC AND ANAEROBIC Blood Culture results may not be optimal due to an excessive volume of blood received in culture bottles   Culture      NO GROWTH < 24 HOURS Performed at [facility], [address] [place], [place] [address]    Report  Status PENDING   CBG monitoring, ED     Status: Abnormal   Collection Time: 06/23/22  9:18 PM  Result Value Ref Range   Glucose-Capillary 298 (H) 70 - 99 mg/dL    Comment: Glucose reference range applies only to samples taken after fasting for at least 8 hours.  CBC     Status: Abnormal   Collection Time: 06/24/22 12:36 AM  Result Value Ref Range   WBC 22.2 (H) 4.0 - 10.5 K/uL   RBC 4.66 4.22 - 5.81 MIL/uL   Hemoglobin 13.0 13.0 - 17.0 g/dL   HCT 81.1 (L) 91.4 - 78.2 %   MCV 81.1 80.0 - 100.0 fL   MCH 27.9 26.0 - 34.0 pg   MCHC 34.4 30.0 - 36.0 g/dL   RDW 95.6 21.3 - 08.6 %   Platelets 273 150 - 400 K/uL   nRBC 0.0 0.0 - 0.2 %    Comment: Performed at [facility], [address] [place], [place] [address]  Basic metabolic panel     Status: Abnormal   Collection Time: 06/24/22 12:36 AM  Result Value Ref Range   Sodium 128 (L) 135 - 145 mmol/L   Potassium 3.8 3.5 - 5.1 mmol/L   Chloride 99 98 - 111 mmol/L   CO2 21 (L) 22 - 32 mmol/L   Glucose, Bld 260 (H) 70 - 99 mg/dL    Comment: Glucose reference range applies only to samples taken after fasting for at least 8 hours.   BUN 7 6 - 20 mg/dL   Creatinine, Ser 9.62 0.61 - 1.24 mg/dL   Calcium 8.1 (L) 8.9 - 10.3 mg/dL   GFR, Estimated >95 >28 mL/min    Comment: (NOTE) Calculated using the CKD-EPI Creatinine Equation (2021)    Anion gap 8 5 - 15    Comment: Performed at [facility], [address], [place], [place] [address]  Procalcitonin     Status: None   Collection Time: 06/24/22 12:36 AM  Result Value Ref Range   Procalcitonin 0.21 ng/mL    Comment:        Interpretation: PCT (Procalcitonin) <= 0.5 ng/mL: Systemic infection (sepsis) is not likely. Local bacterial infection is possible. (NOTE)       Sepsis PCT Algorithm           Lower Respiratory Tract                                      Infection PCT Algorithm    ----------------------------     ----------------------------         PCT < 0.25 ng/mL                 PCT < 0.10 ng/mL          Strongly encourage             Strongly discourage   discontinuation of antibiotics    initiation of antibiotics    ----------------------------     -----------------------------       PCT 0.25 - 0.50 ng/mL            PCT 0.10 - 0.25 ng/mL               OR       >80% decrease in PCT            Discourage initiation of  antibiotics      Encourage discontinuation           of antibiotics    ----------------------------     -----------------------------         PCT >= 0.50 ng/mL              PCT 0.26 - 0.50 ng/mL               AND        <80% decrease in PCT             Encourage initiation of                                             antibiotics       Encourage continuation           of antibiotics    ----------------------------     -----------------------------        PCT >= 0.50 ng/mL                  PCT > 0.50 ng/mL               AND         increase in PCT                  Strongly encourage                                      initiation of antibiotics    Strongly encourage escalation           of antibiotics                                     -----------------------------                                           PCT <= 0.25 ng/mL                                                 OR                                        > 80% decrease in PCT                                      Discontinue / Do not initiate                                             antibiotics  Performed at [facility], [address] [place], [place] [address]   HIV Antibody (routine testing w rflx)     Status: None  Collection Time: 06/24/22 12:36 AM  Result Value Ref Range   HIV Screen 4th Generation wRfx Non Reactive Non Reactive    Comment: Performed at [facility], [address] [place], [place] [address]  Magnesium     Status: None   Collection Time: 06/24/22 12:36 AM  Result Value Ref Range    Magnesium 1.9 1.7 - 2.4 mg/dL    Comment: Performed at [facility], [address], [place], [place] [address]  TSH     Status: None   Collection Time: 06/24/22 12:36 AM  Result Value Ref Range   TSH 3.788 0.350 - 4.500 uIU/mL    Comment: Performed by a 3rd Generation assay with a functional sensitivity of <=0.01 uIU/mL. Performed at [facility], [address], [place], [place] [address]   CBG monitoring, ED     Status: Abnormal   Collection Time: 06/24/22  8:20 AM  Result Value Ref Range   Glucose-Capillary 263 (H) 70 - 99 mg/dL    Comment: Glucose reference range applies only to samples taken after fasting for at least 8 hours.   Comment 1 Notify RN    Comment 2 Document in Chart   CBG monitoring, ED     Status: Abnormal   Collection Time: 06/24/22 11:40 AM  Result Value Ref Range   Glucose-Capillary 252 (H) 70 - 99 mg/dL    Comment: Glucose reference range applies only to samples taken after fasting for at least 8 hours.   Comment 1 Notify RN    Comment 2 Document in Chart    CT Chest W Contrast  Result Date: 06/23/2022 CLINICAL DATA:  Chest wall pain, nontraumatic, infection or inflammation suspected, xray done EXAM: CT CHEST WITH CONTRAST TECHNIQUE: Multidetector CT imaging of the chest was performed during intravenous contrast administration. RADIATION DOSE REDUCTION: This exam was performed according to the departmental dose-optimization program which includes automated exposure control, adjustment of the mA and/or kV according to patient size and/or use of iterative reconstruction technique. CONTRAST:  40mL OMNIPAQUE IOHEXOL 350 MG/ML SOLN COMPARISON:  None Available. FINDINGS: Cardiovascular: Normal heart size. No significant pericardial effusion. The thoracic aorta is normal in caliber. No atherosclerotic plaque of the thoracic aorta. No coronary artery calcifications. Mediastinum/Nodes: Borderline enlarged left axillary lymph nodes. No enlarged mediastinal, hilar,  or right axillary lymph nodes. Thyroid gland, trachea, and esophagus demonstrate no significant findings. Lungs/Pleura: No focal consolidation. No pulmonary nodule. No pulmonary mass. No pleural effusion. No pneumothorax. Upper Abdomen: No acute abnormality. Musculoskeletal: Collimated off view left breast with associated dermal thickening and subcutaneus soft tissue edema. No suspicious lytic or blastic osseous lesions. No acute displaced fracture. Bulky osteophyte formation of the T11-T12 levels. IMPRESSION: 1. Collimated off view left breast with associated dermal thickening and subcutaneus soft tissue edema. Finding could represent infection versus malignancy. 2. Borderline enlarged left axillary lymph nodes. Consider diagnostic mammography/ultrasound for further evaluation. 3. No acute intrathoracic abnormality. Electronically Signed   By: Tish Frederickson M.D.   On: 06/23/2022 17:47    Pending Labs Unresulted Labs (From admission, onward)     Start     Ordered   06/24/22 0500  CBC  Daily at 5am,   R      06/23/22 1543   06/24/22 0500  Basic metabolic panel  Daily at 5am,   R      06/23/22 1543   06/24/22 0500  Procalcitonin  Daily at 5am,   R  06/23/22 2006   06/24/22 0500  Hemoglobin A1c  Tomorrow morning,   R        06/23/22 2006            Vitals/Pain Today's Vitals   06/24/22 1000 06/24/22 1058 06/24/22 1227 06/24/22 1330  BP: (!) 154/88 (!) 148/87 (!) 142/84 124/65  Pulse: (!) 121 (!) 123 (!) 136 (!) 131  Resp:  (!) 22  20  Temp:  (!) 103.1 F (39.5 C)  (!) 103.1 F (39.5 C)  TempSrc:  Oral  Oral  SpO2: 95% 95%  97%  Weight:      Height:      PainSc:        Isolation Precautions No active isolations  Medications Medications  oxyCODONE (Oxy IR/ROXICODONE) immediate release tablet 5 mg (5 mg Oral Given 06/23/22 2110)  vancomycin (VANCOREADY) IVPB 1250 mg/250 mL (0 mg Intravenous Stopped 06/24/22 0348)  enoxaparin (LOVENOX) injection 40 mg (40 mg Subcutaneous  Given 06/23/22 2110)  0.9 %  sodium chloride infusion ( Intravenous New Bag/Given 06/24/22 1425)  acetaminophen (TYLENOL) tablet 650 mg (650 mg Oral Given 06/24/22 1119)    Or  acetaminophen (TYLENOL) suppository 650 mg ( Rectal See Alternative 06/24/22 1119)  ondansetron (ZOFRAN) tablet 4 mg (has no administration in time range)    Or  ondansetron (ZOFRAN) injection 4 mg (has no administration in time range)  nicotine (NICODERM CQ - dosed in mg/24 hours) patch 14 mg (14 mg Transdermal Patch Applied 06/24/22 0915)  metroNIDAZOLE (FLAGYL) IVPB 500 mg (0 mg Intravenous Stopped 06/24/22 0930)  insulin aspart (novoLOG) injection 0-15 Units (8 Units Subcutaneous Given 06/24/22 1159)  insulin aspart (novoLOG) injection 0-5 Units (3 Units Subcutaneous Given 06/23/22 2118)  LORazepam (ATIVAN) tablet 1-4 mg (has no administration in time range)    Or  LORazepam (ATIVAN) injection 1-4 mg (has no administration in time range)  thiamine (VITAMIN B1) tablet 100 mg (100 mg Oral Given 06/24/22 0911)    Or  thiamine (VITAMIN B1) injection 100 mg ( Intravenous See Alternative 06/24/22 0911)  folic acid (FOLVITE) tablet 1 mg (1 mg Oral Given 06/24/22 0911)  multivitamin with minerals tablet 1 tablet (1 tablet Oral Given 06/24/22 0911)  ceFEPIme (MAXIPIME) 2 g in sodium chloride 0.9 % 100 mL IVPB (0 g Intravenous Stopped 06/24/22 1418)  insulin glargine-yfgn (SEMGLEE) injection 15 Units (15 Units Subcutaneous Given 06/24/22 1121)  ketorolac (TORADOL) 15 MG/ML injection 15 mg (15 mg Intravenous Given 06/24/22 1425)  lactated ringers bolus 1,000 mL (0 mLs Intravenous Stopped 06/23/22 1651)    And  lactated ringers bolus 1,000 mL (0 mLs Intravenous Stopped 06/23/22 1546)    And  lactated ringers bolus 200 mL (0 mLs Intravenous Stopped 06/23/22 1739)  vancomycin (VANCOCIN) IVPB 1000 mg/200 mL premix (0 mg Intravenous Stopped 06/23/22 1624)  cefTRIAXone (ROCEPHIN) 2 g in sodium chloride 0.9 % 100 mL IVPB (0 g Intravenous  Stopped 06/23/22 1506)  vancomycin (VANCOREADY) IVPB 1500 mg/300 mL (0 mg Intravenous Stopped 06/23/22 1912)  ibuprofen (ADVIL) tablet 400 mg (400 mg Oral Given 06/23/22 1551)  iohexol (OMNIPAQUE) 350 MG/ML injection 50 mL (50 mLs Intravenous Contrast Given 06/23/22 1703)  acetaminophen (TYLENOL) tablet 1,000 mg (1,000 mg Oral Given 06/23/22 1925)  ceFEPIme (MAXIPIME) 2 g in sodium chloride 0.9 % 100 mL IVPB (0 g Intravenous Stopped 06/23/22 2153)  ketorolac (TORADOL) 15 MG/ML injection 15 mg (15 mg Intravenous Given 06/24/22 0436)  living well with diabetes book MISC ( Does not apply  Given 06/24/22 1121)    Mobility walks Low fall risk   Focused Assessments     R Recommendations: See Admitting Provider Note  Report given to:   Additional Notes:

## 2022-06-24 NOTE — ED Notes (Signed)
MD aware of pt temp and HR - meds to be ordered

## 2022-06-24 NOTE — Progress Notes (Signed)
PROGRESS NOTE    [name]  QIH:[id number] DOB: 05-13-1986 DOA: 06/23/2022 PCP: Patient, [name]    Brief Narrative:  [name] is a 36 y.o. male with no known past medical history who presented with complaints of pain and swelling involving the left chest area.  He also developed fever chills.  Nausea vomiting.  Missed 2 days of work.  Patient reports that he has history of boils especially in his armpit area but nothing has been as severe as this particular infection.  Never had infection in the chest area previously.  Pain is currently 8 out of 10 in intensity.  Sharp pain without any radiation.  Associated symptoms as mentioned above.  Denies any drainage from the area of concern.  Denies any injuries to that area.  He has never been diagnosed with diabetes previously.  Denies any weight loss but he does admit he has gained some weight over the last few months.   In the emergency department patient was found to have swelling and erythema of the left chest area.  CT scan did not show any abscess.  Patient with IV antibiotics.  He will be hospitalized for further management.    Assessment and Plan:  Left chest wall cellulitis, sepsis present on admission:  - no evidence for abscess noted on CT scan.  - CT scan raised concern for infection versus malignancy.   -Presentation is most consistent with infection.   -EDP discussed with Dr. Andrey Campanile with general surgery.  There is a note from him in the chart.   -No fluctuation was appreciated on examination.  The area is quite erythematous.   -MRSA PCR negative.   -Follow-up on blood cultures.   - broad-spectrum coverage for now with vancomycin cefepime and metronidazole and de-escalate depending on clinical improvement and results of tests.      Hyperglycemia/newly diagnosed diabetes mellitus type 2: No known history of for diabetes.   - check HbA1c.  Monitor CBGs.   -diabetes coordinator and will need education.  Treatment strategy  will be based on glucose trends and HbA1c level.   Hyponatremia:  -Likely due to hypovolemia and sepsis.   -Give IV fluids.   -Recheck labs in the morning.    Alcohol abuse:  -Patient was counseled.  CIWA protocol.   Tobacco abuse:  -Patient was counseled.  Nicotine patch   DVT prophylaxis: enoxaparin (LOVENOX) injection 40 mg Start: 06/23/22 2200    Code Status: Full Code   Disposition Plan:  Level of care: Telemetry Medical Status is: Inpatient Remains inpatient appropriate because: needs IV abx    Consultants:  GS (non-formal)-- note in chart   Subjective: Still with pain in left breast  Objective: Vitals:   06/24/22 0407 06/24/22 0415 06/24/22 0613 06/24/22 0625  BP:  124/78  119/66  Pulse:  (!) 117  (!) 106  Resp:    18  Temp: (!) 100.4 F (38 C)  99.5 F (37.5 C)   TempSrc: Oral  Oral   SpO2:  96%  95%  Weight:      Height:        Intake/Output Summary (Last 24 hours) at 06/24/2022 0904 Last data filed at 06/24/2022 0646 Gross per 24 hour  Intake 663.48 ml  Output --  Net 663.48 ml   Filed Weights   06/23/22 1330  Weight: 111.1 kg    Examination:   General: Appearance:    Obese male in no acute distress   Redness and  swelling on left chest wall  Lungs:     respirations unlabored  Heart:    Tachycardic.   MS:   All extremities are intact.    Neurologic:   Awake, alert, oriented x 3. No apparent focal neurological           defect.        Data Reviewed: I have personally reviewed following labs and imaging studies  CBC: Recent Labs  Lab 06/23/22 1348 06/24/22 0036  WBC 21.2* 22.2*  NEUTROABS 18.2*  --   HGB 15.8 13.0  HCT 45.2 37.8*  MCV 79.2* 81.1  PLT 311 273   Basic Metabolic Panel: Recent Labs  Lab 06/23/22 1348 06/24/22 0036  NA 128* 128*  K 4.2 3.8  CL 92* 99  CO2 22 21*  GLUCOSE 365* 260*  BUN 8 7  CREATININE 1.06 0.98  CALCIUM 8.6* 8.1*  MG  --  1.9   GFR: Estimated Creatinine Clearance: 125.6 mL/min (by  C-G formula based on SCr of 0.98 mg/dL). Liver Function Tests: No results for input(s): "AST", "ALT", "ALKPHOS", "BILITOT", "PROT", "ALBUMIN" in the last 168 hours. No results for input(s): "LIPASE", "AMYLASE" in the last 168 hours. No results for input(s): "AMMONIA" in the last 168 hours. Coagulation Profile: No results for input(s): "INR", "PROTIME" in the last 168 hours. Cardiac Enzymes: No results for input(s): "CKTOTAL", "CKMB", "CKMBINDEX", "TROPONINI" in the last 168 hours. BNP (last 3 results) No results for input(s): "PROBNP" in the last 8760 hours. HbA1C: No results for input(s): "HGBA1C" in the last 72 hours. CBG: Recent Labs  Lab 06/23/22 1534 06/23/22 2118 06/24/22 0820  GLUCAP 344* 298* 263*   Lipid Profile: No results for input(s): "CHOL", "HDL", "LDLCALC", "TRIG", "CHOLHDL", "LDLDIRECT" in the last 72 hours. Thyroid Function Tests: Recent Labs    06/24/22 0036  TSH 3.788   Anemia Panel: No results for input(s): "VITAMINB12", "FOLATE", "FERRITIN", "TIBC", "IRON", "RETICCTPCT" in the last 72 hours. Sepsis Labs: Recent Labs  Lab 06/23/22 1340 06/24/22 0036  PROCALCITON  --  0.21  LATICACIDVEN 1.9  --     Recent Results (from the past 240 hour(s))  MRSA Next Gen by PCR, Nasal     Status: None   Collection Time: 06/23/22 12:36 AM   Specimen: Nasal Mucosa; Nasal Swab  Result Value Ref Range Status   MRSA by PCR Next Gen NOT DETECTED NOT DETECTED Final    Comment: (NOTE) The GeneXpert MRSA Assay (FDA approved for NASAL specimens only), is one component of a comprehensive MRSA colonization surveillance program. It is not intended to diagnose MRSA infection nor to guide or monitor treatment for MRSA infections. Test performance is not FDA approved in patients less than 82 years old. Performed at [facility], [address], [place], [place] [address]   Blood culture (routine x 2)     Status: None (Preliminary result)   Collection Time: 06/23/22   1:40 PM   Specimen: BLOOD RIGHT ARM  Result Value Ref Range Status   Specimen Description BLOOD RIGHT ARM  Final   Special Requests   Final    BOTTLES DRAWN AEROBIC AND ANAEROBIC Blood Culture results may not be optimal due to an excessive volume of blood received in culture bottles   Culture   Final    NO GROWTH < 24 HOURS Performed at [facility], [address], [place], [place] [address]    Report Status PENDING  Incomplete  Blood culture (routine x 2)  Status: None (Preliminary result)   Collection Time: 06/23/22  4:10 PM   Specimen: BLOOD  Result Value Ref Range Status   Specimen Description BLOOD SITE NOT SPECIFIED  Final   Special Requests   Final    BOTTLES DRAWN AEROBIC AND ANAEROBIC Blood Culture results may not be optimal due to an excessive volume of blood received in culture bottles   Culture   Final    NO GROWTH < 24 HOURS Performed at [facility], [address], [place], [place] [address]    Report Status PENDING  Incomplete         Radiology Studies: CT Chest W Contrast  Result Date: 06/23/2022 CLINICAL DATA:  Chest wall pain, nontraumatic, infection or inflammation suspected, xray done EXAM: CT CHEST WITH CONTRAST TECHNIQUE: Multidetector CT imaging of the chest was performed during intravenous contrast administration. RADIATION DOSE REDUCTION: This exam was performed according to the departmental dose-optimization program which includes automated exposure control, adjustment of the mA and/or kV according to patient size and/or use of iterative reconstruction technique. CONTRAST:  8mL OMNIPAQUE IOHEXOL 350 MG/ML SOLN COMPARISON:  None Available. FINDINGS: Cardiovascular: Normal heart size. No significant pericardial effusion. The thoracic aorta is normal in caliber. No atherosclerotic plaque of the thoracic aorta. No coronary artery calcifications. Mediastinum/Nodes: Borderline enlarged left axillary lymph nodes. No enlarged mediastinal, hilar, or  right axillary lymph nodes. Thyroid gland, trachea, and esophagus demonstrate no significant findings. Lungs/Pleura: No focal consolidation. No pulmonary nodule. No pulmonary mass. No pleural effusion. No pneumothorax. Upper Abdomen: No acute abnormality. Musculoskeletal: Collimated off view left breast with associated dermal thickening and subcutaneus soft tissue edema. No suspicious lytic or blastic osseous lesions. No acute displaced fracture. Bulky osteophyte formation of the T11-T12 levels. IMPRESSION: 1. Collimated off view left breast with associated dermal thickening and subcutaneus soft tissue edema. Finding could represent infection versus malignancy. 2. Borderline enlarged left axillary lymph nodes. Consider diagnostic mammography/ultrasound for further evaluation. 3. No acute intrathoracic abnormality. Electronically Signed   By: Tish Frederickson M.D.   On: 06/23/2022 17:47        Scheduled Meds:  enoxaparin (LOVENOX) injection  40 mg Subcutaneous Q24H   folic acid  1 mg Oral Daily   insulin aspart  0-15 Units Subcutaneous TID WC   insulin aspart  0-5 Units Subcutaneous QHS   multivitamin with minerals  1 tablet Oral Daily   nicotine  14 mg Transdermal Daily   thiamine  100 mg Oral Daily   Or   thiamine  100 mg Intravenous Daily   Continuous Infusions:  sodium chloride 125 mL/hr at 06/24/22 0515   ceFEPime (MAXIPIME) IV Stopped (06/24/22 1561)   metronidazole 500 mg (06/24/22 0829)   vancomycin Stopped (06/24/22 0348)     LOS: 1 day    Time spent: 45 minutes spent on chart review, discussion with nursing staff, consultants, updating family and interview/physical exam; more than 50% of that time was spent in counseling and/or coordination of care.    Joseph Art, DO [facility] Available via Epic secure chat 7am-7pm After these hours, please refer to coverage provider listed on [email or web address] 06/24/2022, 9:04 AM

## 2022-06-24 NOTE — ED Notes (Signed)
Pt independently ambulatory to restroom and back

## 2022-06-24 NOTE — Progress Notes (Signed)
NEW ADMISSION NOTE New Admission Note:   Arrival Method: stretcher Mental Orientation: A&OX4 Telemetry:64m19 Assessment: Completed Skin: intact, redness abscess L flank/chest, abrasion L chin IV: RAC, Lhand Pain:10/10 Tubes: none Safety Measures: Safety Fall Prevention Plan has been given, discussed and signed Admission: Completed [facility] Orientation: Patient has been orientated to the room, unit and staff.  Family:none at bedside  Orders have been reviewed and implemented. Will continue to monitor the patient. Call light has been placed within reach and bed alarm has been activated.   Roshell Brigham S Jameek Bruntz, RN

## 2022-06-24 NOTE — Inpatient Diabetes Management (Signed)
Inpatient Diabetes Program Recommendations  AACE/ADA: New Consensus Statement on Inpatient Glycemic Control (2015)  Target Ranges:  Prepandial:   less than 140 mg/dL      Peak postprandial:   less than 180 mg/dL (1-2 hours)      Critically ill patients:  140 - 180 mg/dL   Lab Results  Component Value Date   GLUCAP 252 (H) 06/24/2022    Review of Glycemic Control  Latest Reference Range & Units 06/23/22 21:18 06/24/22 08:20 06/24/22 11:40  Glucose-Capillary 70 - 99 mg/dL 298 (H) 263 (H) 252 (H)  (H): Data is abnormally high Diabetes history: New onset DM Outpatient Diabetes medications: none Current orders for Inpatient glycemic control: Novolog 0-15 units TID & HS  Inpatient Diabetes Program Recommendations:    Consider adding Semglee 15 units QD.   Spoke with patient at length regarding new onset DM. Family members have had diabetes and given insulin in the past. Current A1C in process, however, explained what a A1c is and what it measures. Also reviewed goal A1c with patient, importance of good glucose control @ home, and blood sugar goals. Reviewed survival skills, interventions, patho of DM, ADA recommendations for diagnosis, role of pancreas, insulin, impact of poor glycemic control and risk for infection, vascular changes and commorbidities.  Patient will need a meter at discharge. Blood glucose meter #[id number]. Reviewed recommended frequency, when to follow up with PCP and Freestyle libre. Patient is interested in CGM.  Admits to drinking energy drinks and sugary beverages daily. Reviewed alternatives to sugary beverages, impact of ETOH, plate method, nutritional labels, basic carb counting and importance of being mindful of CHO intake.  Educated patient on insulin pen use at home. Reviewed contents of insulin flexpen starter kit. Reviewed all steps if insulin pen including attachment of needle, 2-unit air shot, dialing up dose, giving injection, removing needle, disposal of  sharps, storage of unused insulin, disposal of insulin etc. Patient able to provide successful return demonstration. Also reviewed troubleshooting with insulin pen. MD to give patient Rxs for insulin pens and insulin pen needles. LWWDM booklet, dietitian and insulin starter kit ordered.  Patient to make PCP appointment.  Follow.   Thanks, Bronson Curb, MSN, RNC-OB Diabetes Coordinator [phone] (8a-5p)

## 2022-06-25 DIAGNOSIS — L03313 Cellulitis of chest wall: Secondary | ICD-10-CM | POA: Diagnosis not present

## 2022-06-25 LAB — CBC
HCT: 37.2 % — ABNORMAL LOW (ref 39.0–52.0)
Hemoglobin: 13.4 g/dL (ref 13.0–17.0)
MCH: 27.9 pg (ref 26.0–34.0)
MCHC: 36 g/dL (ref 30.0–36.0)
MCV: 77.5 fL — ABNORMAL LOW (ref 80.0–100.0)
Platelets: 278 10*3/uL (ref 150–400)
RBC: 4.8 MIL/uL (ref 4.22–5.81)
RDW: 12.7 % (ref 11.5–15.5)
WBC: 23.1 10*3/uL — ABNORMAL HIGH (ref 4.0–10.5)
nRBC: 0 % (ref 0.0–0.2)

## 2022-06-25 LAB — BASIC METABOLIC PANEL
Anion gap: 18 — ABNORMAL HIGH (ref 5–15)
BUN: 5 mg/dL — ABNORMAL LOW (ref 6–20)
CO2: 16 mmol/L — ABNORMAL LOW (ref 22–32)
Calcium: 8.8 mg/dL — ABNORMAL LOW (ref 8.9–10.3)
Chloride: 99 mmol/L (ref 98–111)
Creatinine, Ser: 1.04 mg/dL (ref 0.61–1.24)
GFR, Estimated: >60 mL/min (ref >60)
Glucose, Bld: 203 mg/dL — ABNORMAL HIGH (ref 70–99)
Potassium: 3.9 mmol/L (ref 3.5–5.1)
Sodium: 133 mmol/L — ABNORMAL LOW (ref 135–145)

## 2022-06-25 LAB — PROCALCITONIN: Procalcitonin: 1.35 ng/mL

## 2022-06-25 LAB — HEMOGLOBIN A1C
Hgb A1c MFr Bld: 11.6 % — ABNORMAL HIGH (ref 4.8–5.6)
Mean Plasma Glucose: 286 mg/dL

## 2022-06-25 LAB — CULTURE, BLOOD (ROUTINE X 2)

## 2022-06-25 LAB — GLUCOSE, CAPILLARY
Glucose-Capillary: 212 mg/dL — ABNORMAL HIGH (ref 70–99)
Glucose-Capillary: 217 mg/dL — ABNORMAL HIGH (ref 70–99)
Glucose-Capillary: 159 mg/dL — ABNORMAL HIGH (ref 70–99)
Glucose-Capillary: 224 mg/dL — ABNORMAL HIGH (ref 70–99)

## 2022-06-25 LAB — SARS CORONAVIRUS 2 BY RT PCR: SARS Coronavirus 2 by RT PCR: NEGATIVE

## 2022-06-25 MED ORDER — METOPROLOL TARTRATE 5 MG/5ML IV SOLN
5.0000 mg | INTRAVENOUS | Status: DC | PRN
  Administered 2022-06-30 (×2): 5 mg via INTRAVENOUS
  Filled 2022-06-25 (×2): qty 5

## 2022-06-25 MED ORDER — ENSURE ENLIVE PO LIQD
237.0000 mL | Freq: Two times a day (BID) | ORAL | Status: DC
  Administered 2022-06-26 – 2022-07-13 (×19): 237 mL via ORAL

## 2022-06-25 MED ORDER — INSULIN GLARGINE-YFGN 100 UNIT/ML ~~LOC~~ SOLN
20.0000 [IU] | Freq: Every day | SUBCUTANEOUS | Status: DC
  Administered 2022-06-26: 20 [IU] via SUBCUTANEOUS
  Filled 2022-06-25: qty 0.2

## 2022-06-25 MED ORDER — POLYETHYLENE GLYCOL 3350 17 G PO PACK
17.0000 g | PACK | Freq: Every day | ORAL | Status: DC
  Administered 2022-06-25: 17 g via ORAL
  Filled 2022-06-25 (×12): qty 1

## 2022-06-25 NOTE — Plan of Care (Signed)
  Problem: Education: Goal: Ability to describe self-care measures that may prevent or decrease complications (Diabetes Survival Skills Education) will improve Outcome: Progressing Goal: Individualized Educational Video(s) Outcome: Progressing   Problem: Coping: Goal: Ability to adjust to condition or change in health will improve Outcome: Progressing   Problem: Fluid Volume: Goal: Ability to maintain a balanced intake and output will improve Outcome: Progressing   Problem: Health Behavior/Discharge Planning: Goal: Ability to identify and utilize available resources and services will improve Outcome: Progressing Goal: Ability to manage health-related needs will improve Outcome: Progressing   Problem: Metabolic: Goal: Ability to maintain appropriate glucose levels will improve Outcome: Progressing   Problem: Nutritional: Goal: Maintenance of adequate nutrition will improve Outcome: Progressing Goal: Progress toward achieving an optimal weight will improve Outcome: Progressing   Problem: Skin Integrity: Goal: Risk for impaired skin integrity will decrease Outcome: Progressing   Problem: Tissue Perfusion: Goal: Adequacy of tissue perfusion will improve Outcome: Progressing   Problem: Clinical Measurements: Goal: Ability to avoid or minimize complications of infection will improve Outcome: Progressing   Problem: Skin Integrity: Goal: Skin integrity will improve Outcome: Progressing   Problem: Education: Goal: Knowledge of General Education information will improve Description: Including pain rating scale, medication(s)/side effects and non-pharmacologic comfort measures Outcome: Progressing   Problem: Health Behavior/Discharge Planning: Goal: Ability to manage health-related needs will improve Outcome: Progressing   Problem: Clinical Measurements: Goal: Ability to maintain clinical measurements within normal limits will improve Outcome: Progressing Goal: Will  remain free from infection Outcome: Progressing Goal: Diagnostic test results will improve Outcome: Progressing Goal: Respiratory complications will improve Outcome: Progressing Goal: Cardiovascular complication will be avoided Outcome: Progressing   Problem: Activity: Goal: Risk for activity intolerance will decrease Outcome: Progressing   Problem: Nutrition: Goal: Adequate nutrition will be maintained Outcome: Progressing   Problem: Coping: Goal: Level of anxiety will decrease Outcome: Progressing   Problem: Elimination: Goal: Will not experience complications related to bowel motility Outcome: Progressing Goal: Will not experience complications related to urinary retention Outcome: Progressing   Problem: Pain Managment: Goal: General experience of comfort will improve Outcome: Progressing   Problem: Safety: Goal: Ability to remain free from injury will improve Outcome: Progressing   Problem: Skin Integrity: Goal: Risk for impaired skin integrity will decrease Outcome: Progressing   

## 2022-06-25 NOTE — Progress Notes (Signed)
Initial Nutrition Assessment  DOCUMENTATION CODES:   Obesity unspecified  INTERVENTION:  Provide Ensure Enlive po BID with meals, each supplement provides 350 kcal and 20 grams of protein.  Continue thiamine 100 mg daily, folic acid 1 mg daily, multivitamin with minerals daily  Provided nutrition education regarding new onset diabetes (note to follow). Put a copy of "Carbohydrate Counting for People with Diabetes" handout in discharge instructions.  NUTRITION DIAGNOSIS:   Inadequate oral intake related to decreased appetite, nausea as evidenced by per patient/family report.  GOAL:   Patient will meet greater than or equal to 90% of their needs  MONITOR:   PO intake, Supplement acceptance, Labs, Weight trends, I & O's  REASON FOR ASSESSMENT:   Consult Diet education  ASSESSMENT:   36 year old male with PMHx of asthma who is admitted with left chest wall cellulitis, sepsis, hyperglycemia with newly diagnosed diabetes mellitus, hyponatremia, and also with history of alcohol and tobacco use.  Spoke with patient over the phone. He reports his appetite has been decreased since 11/30 but he was able to start eating today. However, upon further investigation, he reports his appetite has been variable/decreased and has had a lot of weight loss. He typically eats 2 meals per day. For lunch he  may have a burger with fries or taco or chicken nuggets. For dinner he may have wings or pasta or chicken. He typically drinks water. He has an [place] energy drink daily and also has been drinking protein shakes in setting of weight loss. He reports nausea since Wednesday. It is overall improved but he still has occasional nausea. He denies abdominal pain, constipation, diarrhea, difficulty chewing or swallowing. He reports allergy to peaches, especially skin (swelling/lip tingling). Added to allergy list. Discussed importance of adequate intake to prevent further unintentional weight loss. Encouraged  pt to choose a good source of protein at meals. He reports he was able to eat 75% of his breakfast this morning. He is amenable to drinking Ensure Enlive with meals to help meet calorie/protein needs and prevent further unintentional weight loss.  Pt had questions regarding how to eat with new diagnosis of DM. Provided nutrition education related to new onset diabetes (note to follow).   Pt reports his UBW was 300 lbs and he had slow, unintentional weight loss over approximately 3 years. Do not see a weight of 300 lbs in chart. Per chart pt was 124.7 kg (274 lbs) on 09/16/2016. He is currently 111.1 kg. He has lost 13.6 kg (10.9% weight) over >4 years, which is not significant for time frame, but is still concerning as it was unintentional weight loss and pt reports he feels he has lost muscle mass.  UOP: 5 occurrences UOP yesterday  I/O: +2387.7 mL since admission (unsure of accuracy as exact UOP not being documented)  Medications reviewed and include: folic acid 1 mg daily, Novolog 0-15 units TID, Novolog 0-5 units QHS, Semglee 20 units daily, multivitamin daily, nicotine patch, Miralax 17 grams daily, thiamine 100 mg daily, NS at 125 mL/hr, cefepime, Flagyl, vancomycin  Labs reviewed: CBG 179-217 past 24 hrs, Sodium 133, CO2 16, BUN 5 Hemoglobin A1c 11.6 06/24/22  Patient does not meet criteria for malnutrition at this time but is at risk for malnutrition due to decreased appetite and history of unintentional weight loss. He would benefit from nutrition-focused physical exam at follow-up to further investigate nutritional status.  NUTRITION - FOCUSED PHYSICAL EXAM:  Unable to complete as RD working remotely  Diet  Order:   Diet Order             Diet Carb Modified Fluid consistency: Thin; Room service appropriate? Yes  Diet effective now                  EDUCATION NEEDS:   Education needs have been addressed  Skin:  Skin Assessment: Skin Integrity Issues: Skin Integrity Issues::  Other (Comment) Incisions: N/A Other: Wound to chest; documented as left chest wall celllulitis per MD note  Last BM:  06/24/22  Height:   Ht Readings from Last 1 Encounters:  06/23/22 5\' 9"  (1.753 m)   Weight:   Wt Readings from Last 1 Encounters:  06/23/22 111.1 kg   Ideal Body Weight:  72.7 kg  BMI:  Body mass index is 36.18 kg/m.  Estimated Nutritional Needs:   Kcal:  2200-2400  Protein:  110-120 grams  Fluid:  2.2-2.4 L/day  06/25/22, MS, RD, LDN, CNSC Pager number available on Amion

## 2022-06-25 NOTE — Progress Notes (Signed)
PROGRESS NOTE    [name]  PXT:[id number] DOB: 1986/05/11 DOA: 06/23/2022 PCP: Patient, [name]    Brief Narrative:  [name] is a 36 y.o. male with no known past medical history who presented with complaints of pain and swelling involving the left chest area.  He also developed fever chills.  Nausea vomiting.  Missed 2 days of work.  Patient reports that he has history of boils especially in his armpit area but nothing has been as severe as this particular infection.  Never had infection in the chest area previously.  Pain is currently 8 out of 10 in intensity.  Sharp pain without any radiation.  Associated symptoms as mentioned above.  Denies any drainage from the area of concern.  Denies any injuries to that area.  He has never been diagnosed with diabetes previously.  Denies any weight loss but he does admit he has gained some weight over the last few months.   In the emergency department patient was found to have swelling and erythema of the left chest area.  CT scan did not show any abscess.  Patient with IV antibiotics.     Assessment and Plan:  Left chest wall cellulitis, sepsis present on admission:  - no evidence for abscess noted on CT scan.  - CT scan raised concern for infection versus malignancy.   -Presentation is most consistent with infection.   -EDP discussed with Dr. Andrey Campanile with general surgery.  There is a note from him in the chart.   -No fluctuation was appreciated on examination.  The area is quite erythematous.  -- may need to re-image. -MRSA PCR negative.   -Follow-up on blood cultures.   - broad-spectrum coverage for now with vancomycin cefepime and metronidazole and de-escalate depending on clinical improvement and results of tests.      Hyperglycemia/newly diagnosed diabetes mellitus type 2: No known history of for diabetes.   - 11.6 HbA1c.  Monitor CBGs.   -diabetes coordinator and will need education -started lantus   Hyponatremia:  -Likely  due to hypovolemia and sepsis.   -Give IV fluids.   -Recheck labs in the morning.    Alcohol abuse:  -Patient was counseled.  CIWA protocol.   Tobacco abuse:  -Patient was counseled.  Nicotine patch   DVT prophylaxis: enoxaparin (LOVENOX) injection 40 mg Start: 06/23/22 2200    Code Status: Full Code   Disposition Plan:  Level of care: Telemetry Medical Status is: Inpatient Remains inpatient appropriate because: needs IV abx    Consultants:  GS (non-formal)-- note in chart   Subjective: Toradol helps pain  Objective: Vitals:   06/25/22 0303 06/25/22 0433 06/25/22 0626 06/25/22 0744  BP: (!) 149/88 127/74 124/70 (!) 142/82  Pulse: (!) 144 (!) 118 (!) 118 (!) 119  Resp: 18 16 18 18   Temp: (!) 102.2 F (39 C) (!) 100.5 F (38.1 C) 100.1 F (37.8 C) (!) 101.1 F (38.4 C)  TempSrc: Oral Oral Oral Oral  SpO2: 96% 97% 97% 98%  Weight:      Height:        Intake/Output Summary (Last 24 hours) at 06/25/2022 1218 Last data filed at 06/25/2022 1000 Gross per 24 hour  Intake 3799.17 ml  Output 550 ml  Net 3249.17 ml   Filed Weights   06/23/22 1330  Weight: 111.1 kg    Examination:   General: Appearance:    Obese male in no acute distress   Redness around left chest wall  Lungs:      respirations unlabored  Heart:    Tachycardic. Normal rhythm. No murmurs, rubs, or gallops.   MS:   All extremities are intact.   Neurologic:   Awake, alert, oriented x 3. No apparent focal neurological           defect.          Data Reviewed: I have personally reviewed following labs and imaging studies  CBC: Recent Labs  Lab 06/23/22 1348 06/24/22 0036 06/25/22 0230  WBC 21.2* 22.2* 23.1*  NEUTROABS 18.2*  --   --   HGB 15.8 13.0 13.4  HCT 45.2 37.8* 37.2*  MCV 79.2* 81.1 77.5*  PLT 311 273 278   Basic Metabolic Panel: Recent Labs  Lab 06/23/22 1348 06/24/22 0036 06/25/22 0230  NA 128* 128* 133*  K 4.2 3.8 3.9  CL 92* 99 99  CO2 22 21* 16*  GLUCOSE 365*  260* 203*  BUN 8 7 5*  CREATININE 1.06 0.98 1.04  CALCIUM 8.6* 8.1* 8.8*  MG  --  1.9  --    GFR: Estimated Creatinine Clearance: 118.4 mL/min (by C-G formula based on SCr of 1.04 mg/dL). Liver Function Tests: No results for input(s): "AST", "ALT", "ALKPHOS", "BILITOT", "PROT", "ALBUMIN" in the last 168 hours. No results for input(s): "LIPASE", "AMYLASE" in the last 168 hours. No results for input(s): "AMMONIA" in the last 168 hours. Coagulation Profile: No results for input(s): "INR", "PROTIME" in the last 168 hours. Cardiac Enzymes: No results for input(s): "CKTOTAL", "CKMB", "CKMBINDEX", "TROPONINI" in the last 168 hours. BNP (last 3 results) No results for input(s): "PROBNP" in the last 8760 hours. HbA1C: Recent Labs    06/24/22 0036  HGBA1C 11.6*   CBG: Recent Labs  Lab 06/24/22 1140 06/24/22 1823 06/24/22 2040 06/25/22 0729 06/25/22 1138  GLUCAP 252* 179* 192* 212* 217*   Lipid Profile: No results for input(s): "CHOL", "HDL", "LDLCALC", "TRIG", "CHOLHDL", "LDLDIRECT" in the last 72 hours. Thyroid Function Tests: Recent Labs    06/24/22 0036  TSH 3.788   Anemia Panel: No results for input(s): "VITAMINB12", "FOLATE", "FERRITIN", "TIBC", "IRON", "RETICCTPCT" in the last 72 hours. Sepsis Labs: Recent Labs  Lab 06/23/22 1340 06/24/22 0036 06/25/22 0230  PROCALCITON  --  0.21 1.35  LATICACIDVEN 1.9  --   --     Recent Results (from the past 240 hour(s))  MRSA Next Gen by PCR, Nasal     Status: None   Collection Time: 06/23/22 12:36 AM   Specimen: Nasal Mucosa; Nasal Swab  Result Value Ref Range Status   MRSA by PCR Next Gen NOT DETECTED NOT DETECTED Final    Comment: (NOTE) The GeneXpert MRSA Assay (FDA approved for NASAL specimens only), is one component of a comprehensive MRSA colonization surveillance program. It is not intended to diagnose MRSA infection nor to guide or monitor treatment for MRSA infections. Test performance is not FDA approved in  patients less than 19 years old. Performed at [facility], [address], [place], [place] [address]   Blood culture (routine x 2)     Status: None (Preliminary result)   Collection Time: 06/23/22  1:40 PM   Specimen: BLOOD RIGHT ARM  Result Value Ref Range Status   Specimen Description BLOOD RIGHT ARM  Final   Special Requests   Final    BOTTLES DRAWN AEROBIC AND ANAEROBIC Blood Culture results may not be optimal due to an excessive volume of blood received in culture bottles  Culture   Final    NO GROWTH 2 DAYS Performed at [facility], [address] [place], [place] [address]    Report Status PENDING  Incomplete  Blood culture (routine x 2)     Status: None (Preliminary result)   Collection Time: 06/23/22  4:10 PM   Specimen: BLOOD  Result Value Ref Range Status   Specimen Description BLOOD SITE NOT SPECIFIED  Final   Special Requests   Final    BOTTLES DRAWN AEROBIC AND ANAEROBIC Blood Culture results may not be optimal due to an excessive volume of blood received in culture bottles   Culture   Final    NO GROWTH 2 DAYS Performed at [facility], [address] [place], [place] [address]    Report Status PENDING  Incomplete         Radiology Studies: CT Chest W Contrast  Result Date: 06/23/2022 CLINICAL DATA:  Chest wall pain, nontraumatic, infection or inflammation suspected, xray done EXAM: CT CHEST WITH CONTRAST TECHNIQUE: Multidetector CT imaging of the chest was performed during intravenous contrast administration. RADIATION DOSE REDUCTION: This exam was performed according to the departmental dose-optimization program which includes automated exposure control, adjustment of the mA and/or kV according to patient size and/or use of iterative reconstruction techniArMarland KitchPerimeter Behavioral Hospital Of SpringfielKoreTennova HealthcareMadera Ambulatory Endoscopy CenGeoArMarland KitchLivingston Hospital And Healthcare ServiceKorePremier PhysiciaAngelina Theresa Bucci Eye SurgeArMarland KitchMarion Il Va Medical CenteKorearnlCaPutnam GeGrand Junction Va Medical ArMarland KitchRegency Hospital Of GreenvillKoreaenlMYouth Villages - Inner Presence [facility] Network Dba Presence [facility] CenGeographical inforArMarland KitchWeston County Health ServiceKoreasPam Rehabilitation HospitSilver Springs Rural Health CentGeographical informatiAundri23The Medical Center Of [facility] Beaumont CampMethodist Hospital-NArMarland KitchJefferson Surgical Ctr At Navy YarKoreCentennial Altus LumbertonGArMarland KitchProvidence Medical CenteKorearnlPlastic Surgical Center West Bank Surgery Center GeographArMarland KitchPacific Eye InstitutKHosp Psiquiatria FoMedical City Las ColiGeographical informaArMarland KitchCape Cod Asc LLKoreaBox Canyon SurgChildrens Hospital Of Wisconsin Fox ValGeoArMarland KitchSwall Medical CorporatioKoreannLincoln CommUt Health East Texas QuitGeogrArMarland KitchPam Specialty Hospital Of Texarkana SoutKoreahnlSchoolcraft MemSparrow Ionia HospiGeogArMarland KitchSpectrum Health Butterworth CampuKoreaRNorth Oaks Rehabilitation HospiGeogArMarland KitchDefiance Regional Medical CenteKoreMercy Medical CenLewis And Clark Orthopaedic Institute Geographical infoArMarland KitchBeraja Healthcare CorporatioKoreannlKimball HSaint Catherine Regional HospiGeographical ArMarland KitchPorter Regional HospitaKorealnlRolSanford WestbroPhoenix Children'S Hospital At Dignity Health'S Mercy GilbGeographicalArMarland KitchKent County Memorial HospitaKorealnlWyoming EnLivingston Asc Geographical inforArMarland KitchEndoscopy Center Of Central PennsylvaniKoreChillicothe Va AmbulaArMarland KitchMt. Graham Regional Medical CenteKore[address] SurgMemorial HealthcGeographicalArMarland KitchSayre Memorial HospitaKorealnlPUplWilliamsburg Regional HospiGeographical informatiAundri52mHayden RRunner, broadcasting/ArMarland KitchAdcare Hospital Of Worcester InKoreacnlWiBrook Plaza Ambulatory SKpc Promise Hospital Of Overland PGeographical iArMarland KitchCommunity Digestive CenteKorearnlSDigestive Disease CenteHealtheast Surgery Center Maplewood GeographArMarland KitchSan Jorge Childrens HospitaKoreaSurgcenter At Paradise Valley LLC Dba Surgcenter AtMidwest Surgery Center GeogrArMarland KitchWilmington GastroenterologKoreUniversity Of KRegency Hospital Of Cleveland EGeographArMarland KitchBrazoria County Surgery Center LLKoreaCnMercy HUniversity Of Maryland Medicine Asc GeogrArMarland KitchValley Laser And Surgery Center InKoreThibodauxSurgical Specialty Associates Geographical iArMarland KitchAdvocate Sherman HospitaKorealnLandmark HospitChristus Dubuis Hospital Of AlexandGeographical infoArMarland KitchTeton Medical CenteKoreaEastern Maine Iu Health East Washington Ambulatory Surgery Center Geographical informArMarland KitchValley Gastroenterology PKoreasEye Surgery And Laser Center GeographicArMarland KitchKing'S Daughters' HealtKoreahnlChristus St. MichaelFaith Community HospiGeograArMarland KitchChristian Hospital NorthwesKoreatnlPrairie Ridge Journey Lite Of Cincinnati GeographicalArMarland KitchSouthwest Surgical SuiteKoreasnlEssentia HeBrazosport Eye InstitGeographical informatiAundria Rudsystems officer5mHayden RRunner, broadcasting/film/vidPotomac View Surgery Center LLC rt size. No significant pericardial effusion. The thoracic aorta is  normal in caliber. No atherosclerotic plaque of the thoracic aorta. No coronary artery calcifications. Mediastinum/Nodes: Borderline enlarged left axillary lymph nodes. No enlarged mediastinal, hilar, or right axillary lymph nodes. Thyroid gland, trachea, and esophagus demonstrate no significant findings. Lungs/Pleura: No focal consolidation. No pulmonary nodule. No pulmonary mass. No pleural effusion. No pneumothorax. Upper Abdomen: No acute abnormality. Musculoskeletal: Collimated off view left breast with associated dermal thickening and subcutaneus soft tissue edema. No suspicious lytic or blastic osseous lesions. No acute displaced fracture. Bulky osteophyte formation of the T11-T12 levels. IMPRESSION: 1. Collimated off view left breast with associated dermal thickening and subcutaneus soft tissue edema. Finding could represent infection versus malignancy. 2. Borderline enlarged left axillary lymph nodes. Consider diagnostic mammography/ultrasound for further evaluation. 3. No acute intrathoracic abnormality. Electronically Signed   By: Morgane  Naveau M.D.   On: 06/23/2022 17:47        Scheduled Meds:  enoxaparin (LOVENOX) injection  40 mg Subcutaneous Q24H   folic acid  1 mg Oral Daily   insulin aspart  0-15 Units Subcutaneous TID WC   insulin aspart  0-5 Units Subcutaneous QHS   insulin glargine-yfgn  15 Units Subcutaneous Daily   multivitamin with minerals  1 tablet Oral Daily   nicotine  14 mg Transdermal Daily   polyethylene glycol  17 g Oral Daily   thiamine  100 mg Oral Daily   Or   thiamine  100 mg Intravenous Daily   Continuous Infusions:  sodium chloride 125 mL/hr at 06/25/22 0600   ceFEPime (MAXIPIME) IV Stopped (06/25/22 0608)   metronidazole 500 mg (06/25/22 0924)   vancomycin 1,250 mg (06/25/22 0621)     LOS: 2 days    Time spent: 45 minutes spent on chart review, discussion with nursing staff, consultants, updating family and interview/physical exam; more than 50% of  that time was spent in counseling and/or coordination of care.    Braxden Lovering U Robyn Galati, DO [facility] Available via Epic secure chat 7am-7pm After these hours, please refer to coverage provider listed on [email or web address]  06/25/2022, 12:18 PM

## 2022-06-25 NOTE — Plan of Care (Signed)
  Problem: Education: Goal: Ability to describe self-care measures that may prevent or decrease complications (Diabetes Survival Skills Education) will improve Outcome: Progressing   Problem: Coping: Goal: Ability to adjust to condition or change in health will improve Outcome: Progressing   Problem: Fluid Volume: Goal: Ability to maintain a balanced intake and output will improve Outcome: Progressing   

## 2022-06-25 NOTE — Progress Notes (Signed)
Pt. Back to red MEWS, T 102.6, HR 130, MD made aware, cold packs applied per MD, Rapid Response team also made aware, will cont. To monitor  [name]

## 2022-06-25 NOTE — Progress Notes (Signed)
Dr. Benjamine Mola aware of fever and HR, no new orders obtained at present  [facility]

## 2022-06-25 NOTE — Discharge Instructions (Addendum)
Carbohydrate Counting For People With Diabetes  Foods with carbohydrates make your blood glucose level go up. Learning how to count carbohydrates can help you control your blood glucose levels. First, identify the foods you eat that contain carbohydrates. Then, using the Foods with Carbohydrates chart, determine about how much carbohydrates are in your meals and snacks. Make sure you are eating foods with fiber, protein, and healthy fat along with your carbohydrate foods. Foods with Carbohydrates The following table shows carbohydrate foods that have about 15 grams of carbohydrate each. Using measuring cups, spoons, or a food scale when you first begin learning about carbohydrate counting can help you learn about the portion sizes you typically eat. The following foods have 15 grams carbohydrate each:  Grains 1 slice bread (1 ounce)  1 small tortilla (6-inch size)   large bagel (1 ounce)  1/3 cup pasta or rice (cooked)   hamburger or hot dog bun ( ounce)   cup cooked cereal   to  cup ready-to-eat cereal  2 taco shells (5-inch size) Fruit 1 small fresh fruit ( to 1 cup)   medium banana  17 small grapes (3 ounces)  1 cup melon or berries   cup canned or frozen fruit  2 tablespoons dried fruit (blueberries, cherries, cranberries, raisins)   cup unsweetened fruit juice  Starchy Vegetables  cup cooked beans, peas, corn, potatoes/sweet potatoes   large baked potato (3 ounces)  1 cup acorn or butternut squash  Snack Foods 3 to 6 crackers  8 potato chips or 13 tortilla chips ( ounce to 1 ounce)  3 cups popped popcorn  Dairy 3/4 cup (6 ounces) nonfat plain yogurt, or yogurt with sugar-free sweetener  1 cup milk  1 cup plain rice, soy, coconut or flavored almond milk Sweets and Desserts  cup ice cream or frozen yogurt  1 tablespoon jam, jelly, pancake syrup, table sugar, or honey  2 tablespoons light pancake syrup  1 inch square of frosted cake or 2 inch square of unfrosted  cake  2 small cookies (2/3 ounce each) or  large cookie  Sometimes you'll have to estimate carbohydrate amounts if you don't know the exact recipe. One cup of mixed foods like soups can have 1 to 2 carbohydrate servings, while some casseroles might have 2 or more servings of carbohydrate. Foods that have less than 20 calories in each serving can be counted as "free" foods. Count 1 cup raw vegetables, or  cup cooked non-starchy vegetables as "free" foods. If you eat 3 or more servings at one meal, then count them as 1 carbohydrate serving.  Foods without Carbohydrates  Not all foods contain carbohydrates. Meat, some dairy, fats, non-starchy vegetables, and many beverages don't contain carbohydrate. So when you count carbohydrates, you can generally exclude chicken, pork, beef, fish, seafood, eggs, tofu, cheese, butter, sour cream, avocado, nuts, seeds, olives, mayonnaise, water, black coffee, unsweetened tea, and zero-calorie drinks. Vegetables with no or low carbohydrate include green beans, cauliflower, tomatoes, and onions. How much carbohydrate should I eat at each meal?  Carbohydrate counting can help you plan your meals and manage your weight. Following are some starting points for carbohydrate intake at each meal. Work with your registered dietitian nutritionist to find the best range that works for your blood glucose and weight.   To Lose Weight To Maintain Weight  Women 2 - 3 carb servings 3 - 4 carb servings  Men 3 - 4 carb servings 4 - 5 carb servings  Checking your   blood glucose after meals will help you know if you need to adjust the timing, type, or number of carbohydrate servings in your meal plan. Achieve and keep a healthy body weight by balancing your food intake and physical activity.  Tips How should I plan my meals?  Plan for half the food on your plate to include non-starchy vegetables, like salad greens, broccoli, or carrots. Try to eat 3 to 5 servings of non-starchy vegetables  every day. Have a protein food at each meal. Protein foods include chicken, fish, meat, eggs, or beans (note that beans contain carbohydrate). These two food groups (non-starchy vegetables and proteins) are low in carbohydrate. If you fill up your plate with these foods, you will eat less carbohydrate but still fill up your stomach. Try to limit your carbohydrate portion to  of the plate.  What fats are healthiest to eat?  Diabetes increases risk for heart disease. To help protect your heart, eat more healthy fats, such as olive oil, nuts, and avocado. Eat less saturated fats like butter, cream, and high-fat meats, like bacon and sausage. Avoid trans fats, which are in all foods that list "partially hydrogenated oil" as an ingredient. What should I drink?  Choose drinks that are not sweetened with sugar. The healthiest choices are water, carbonated or seltzer waters, and tea and coffee without added sugars.  Sweet drinks will make your blood glucose go up very quickly. One serving of soda or energy drink is  cup. It is best to drink these beverages only if your blood glucose is low.  Artificially sweetened, or diet drinks, typically do not increase your blood glucose if they have zero calories in them. Read labels of beverages, as some diet drinks do have carbohydrate and will raise your blood glucose. Label Reading Tips Read Nutrition Facts labels to find out how many grams of carbohydrate are in a food you want to eat. Don't forget: sometimes serving sizes on the label aren't the same as how much food you are going to eat, so you may need to calculate how much carbohydrate is in the food you are serving yourself.   Carbohydrate Counting for People with Diabetes Sample 1-Day Menu  Breakfast  cup yogurt, low fat, low sugar (1 carbohydrate serving)   cup cereal, ready-to-eat, unsweetened (1 carbohydrate serving)  1 cup strawberries (1 carbohydrate serving)   cup almonds ( carbohydrate serving)   Lunch 1, 5 ounce can chunk light tuna  2 ounces cheese, low fat cheddar  6 whole wheat crackers (1 carbohydrate serving)  1 small apple (1 carbohydrate servings)   cup carrots ( carbohydrate serving)   cup snap peas  1 cup 1% milk (1 carbohydrate serving)   Evening Meal Stir fry made with: 3 ounces chicken  1 cup brown rice (3 carbohydrate servings)   cup broccoli ( carbohydrate serving)   cup green beans   cup onions  1 tablespoon olive oil  2 tablespoons teriyaki sauce ( carbohydrate serving)  Evening Snack 1 extra small banana (1 carbohydrate serving)  1 tablespoon peanut butter   Carbohydrate Counting for People with Diabetes Vegan Sample 1-Day Menu  Breakfast 1 cup cooked oatmeal (2 carbohydrate servings)   cup blueberries (1 carbohydrate serving)  2 tablespoons flaxseeds  1 cup soymilk fortified with calcium and vitamin D  1 cup coffee  Lunch 2 slices whole wheat bread (2 carbohydrate servings)   cup baked tofu   cup lettuce  2 slices tomato  2 slices avocado     cup baby carrots ( carbohydrate serving)  1 orange (1 carbohydrate serving)  1 cup soymilk fortified with calcium and vitamin D   Evening Meal Burrito made with: 1 6-inch corn tortilla (1 carbohydrate serving)  1 cup refried vegetarian beans (2 carbohydrate servings)   cup chopped tomatoes   cup lettuce   cup salsa  1/3 cup brown rice (1 carbohydrate serving)  1 tablespoon olive oil for rice   cup zucchini   Evening Snack 6 small whole grain crackers (1 carbohydrate serving)  2 apricots ( carbohydrate serving)   cup unsalted peanuts ( carbohydrate serving)    Carbohydrate Counting for People with Diabetes Vegetarian (Lacto-Ovo) Sample 1-Day Menu  Breakfast 1 cup cooked oatmeal (2 carbohydrate servings)   cup blueberries (1 carbohydrate serving)  2 tablespoons flaxseeds  1 egg  1 cup 1% milk (1 carbohydrate serving)  1 cup coffee  Lunch 2 slices whole wheat bread (2 carbohydrate  servings)  2 ounces low-fat cheese   cup lettuce  2 slices tomato  2 slices avocado   cup baby carrots ( carbohydrate serving)  1 orange (1 carbohydrate serving)  1 cup unsweetened tea  Evening Meal Burrito made with: 1 6-inch corn tortilla (1 carbohydrate serving)   cup refried vegetarian beans (1 carbohydrate serving)   cup tomatoes   cup lettuce   cup salsa  1/3 cup brown rice (1 carbohydrate serving)  1 tablespoon olive oil for rice   cup zucchini  1 cup 1% milk (1 carbohydrate serving)  Evening Snack 6 small whole grain crackers (1 carbohydrate serving)  2 apricots ( carbohydrate serving)   cup unsalted peanuts ( carbohydrate serving)    Copyright 2020  Academy of [employer] and Dietetics. All rights reserved.  Using Nutrition Labels: Carbohydrate  Serving Size  Look at the serving size. All the information on the label is based on this portion. Servings Per Container  The number of servings contained in the package. Guidelines for Carbohydrate  Look at the total grams of carbohydrate in the serving size.  1 carbohydrate choice = 15 grams of carbohydrate. Range of Carbohydrate Grams Per Choice  Carbohydrate Grams/Choice Carbohydrate Choices  6-10   11-20 1  21-25 1  26-35 2  36-40 2  41-50 3  51-55 3  56-65 4  66-70 4  71-80 5    Copyright 2020  Academy of [employer] and Dietetics. All rights reserved.   Follow with Primary MD in 7 days   Get CBC, CMP, 2 view Chest X ray -  checked next visit with your primary MD    Activity: As tolerated with Full fall precautions use walker/cane & assistance as needed  Disposition Home    Diet: Heart Healthy Low Carb  Accuchecks 4 times/day, Once in AM empty stomach and then before each meal. Log in all results and show them to your Prim.MD in 3 days. If any glucose reading is under 80 or above 300 call your Prim MD immidiately. Follow Low glucose instructions for glucose under 80 as  instructed.   Special Instructions: If you have smoked or chewed Tobacco  in the last 2 yrs please stop smoking, stop any regular Alcohol  and or any Recreational drug use.  On your next visit with your primary care physician please Get Medicines reviewed and adjusted.  Please request your Prim.MD to go over all Hospital Tests and Procedure/Radiological results at the follow up, please get all Hospital records sent to your Prim MD by signing  hospital release before you go home.  If you experience worsening of your admission symptoms, develop shortness of breath, life threatening emergency, suicidal or homicidal thoughts you must seek medical attention immediately by calling 911 or calling your MD immediately  if symptoms less severe.  You Must read complete instructions/literature along with all the possible adverse reactions/side effects for all the Medicines you take and that have been prescribed to you. Take any new Medicines after you have completely understood and accpet all the possible adverse reactions/side effects.

## 2022-06-25 NOTE — Progress Notes (Signed)
TRH night cross cover note:   I was notified by RN of this patient's HR, sustained in the 120's -140's. He is asymptomatic and normotensive.  Per chart review, it appears that the patient has been persistently tachycardic, with heart rates in the above range for the last day.  Review of most recent EKG from 1 day ago, there is demonstration of sinus tachycardia with heart rate 140 and no evidence of acute ischemic changes.  I have placed order for stat EKG to reevaluate, and have order prn iv lopressor for sustained HR's than 130 bpm.     Newton Pigg, DO Hospitalist

## 2022-06-25 NOTE — Progress Notes (Signed)
Red MEWS, HR 123, Temp 103.6, Dr. Benjamine Mola aware, new order obtained for Covid Swab and swab obtained, Toradol given, will cont. To monitor  [name]

## 2022-06-25 NOTE — Plan of Care (Signed)
  RD consulted for nutrition education regarding diabetes.   Lab Results  Component Value Date   HGBA1C 11.6 (H) 06/24/2022    RD discussed "Carbohydrate Counting for People with Diabetes" handout from the Academy of [employer]etics with patient over the phone. Put copy of handout in discharge instructions. Discussed different food groups and their effects on blood sugar, emphasizing carbohydrate-containing foods. Provided list of carbohydrates and recommended serving sizes of common foods.  Discussed importance of controlled and consistent carbohydrate intake throughout the day. Provided examples of ways to balance meals/snacks and encouraged intake of high-fiber, whole grain complex carbohydrates. Teach back method used.  Expect good compliance.  Body mass index is 36.18 kg/m. Pt meets criteria for obesity class II based on current BMI.  Current diet order is carbohydrate modified, patient is consuming approximately 7t% of meals at this time. Labs and medications reviewed.   Letta Median, MS, RD, LDN, CNSC Pager number available on Amion

## 2022-06-26 ENCOUNTER — Inpatient Hospital Stay (HOSPITAL_COMMUNITY): Payer: No Typology Code available for payment source

## 2022-06-26 DIAGNOSIS — L03313 Cellulitis of chest wall: Secondary | ICD-10-CM | POA: Diagnosis not present

## 2022-06-26 LAB — CBC
HCT: 32.6 % — ABNORMAL LOW (ref 39.0–52.0)
Hemoglobin: 11.6 g/dL — ABNORMAL LOW (ref 13.0–17.0)
MCH: 27.4 pg (ref 26.0–34.0)
MCHC: 35.6 g/dL (ref 30.0–36.0)
MCV: 77.1 fL — ABNORMAL LOW (ref 80.0–100.0)
Platelets: 278 10*3/uL (ref 150–400)
RBC: 4.23 MIL/uL (ref 4.22–5.81)
RDW: 12.8 % (ref 11.5–15.5)
WBC: 17.8 10*3/uL — ABNORMAL HIGH (ref 4.0–10.5)
nRBC: 0 % (ref 0.0–0.2)

## 2022-06-26 LAB — BASIC METABOLIC PANEL
Anion gap: 10 (ref 5–15)
BUN: 7 mg/dL (ref 6–20)
CO2: 18 mmol/L — ABNORMAL LOW (ref 22–32)
Calcium: 7.8 mg/dL — ABNORMAL LOW (ref 8.9–10.3)
Chloride: 104 mmol/L (ref 98–111)
Creatinine, Ser: 0.99 mg/dL (ref 0.61–1.24)
GFR, Estimated: >60 mL/min (ref >60)
Glucose, Bld: 199 mg/dL — ABNORMAL HIGH (ref 70–99)
Potassium: 3.5 mmol/L (ref 3.5–5.1)
Sodium: 132 mmol/L — ABNORMAL LOW (ref 135–145)

## 2022-06-26 LAB — GLUCOSE, CAPILLARY
Glucose-Capillary: 195 mg/dL — ABNORMAL HIGH (ref 70–99)
Glucose-Capillary: 194 mg/dL — ABNORMAL HIGH (ref 70–99)
Glucose-Capillary: 187 mg/dL — ABNORMAL HIGH (ref 70–99)
Glucose-Capillary: 200 mg/dL — ABNORMAL HIGH (ref 70–99)

## 2022-06-26 LAB — CULTURE, BLOOD (ROUTINE X 2)

## 2022-06-26 MED ORDER — ACETAMINOPHEN 650 MG RE SUPP: 650.0000 mg | Freq: Four times a day (QID) | RECTAL | Status: DC | PRN

## 2022-06-26 MED ORDER — ENOXAPARIN SODIUM 60 MG/0.6ML IJ SOSY
55.0000 mg | PREFILLED_SYRINGE | INTRAMUSCULAR | Status: DC
  Administered 2022-06-26 – 2022-07-08 (×13): 55 mg via SUBCUTANEOUS
  Filled 2022-06-26 (×13): qty 0.6

## 2022-06-26 MED ORDER — ACETAMINOPHEN 325 MG PO TABS
325.0000 mg | ORAL_TABLET | Freq: Once | ORAL | Status: AC
  Administered 2022-06-26: 325 mg via ORAL

## 2022-06-26 MED ORDER — ORAL CARE MOUTH RINSE: 15.0000 mL | OROMUCOSAL | Status: DC | PRN

## 2022-06-26 MED ORDER — INSULIN ASPART 100 UNIT/ML IJ SOLN
3.0000 [IU] | Freq: Three times a day (TID) | INTRAMUSCULAR | Status: AC
  Administered 2022-06-27: 3 [IU] via SUBCUTANEOUS

## 2022-06-26 MED ORDER — ACETAMINOPHEN 500 MG PO TABS
1000.0000 mg | ORAL_TABLET | Freq: Four times a day (QID) | ORAL | Status: DC | PRN
  Administered 2022-06-26 – 2022-07-04 (×18): 1000 mg via ORAL
  Filled 2022-06-26 (×18): qty 2

## 2022-06-26 MED ORDER — INSULIN GLARGINE-YFGN 100 UNIT/ML ~~LOC~~ SOLN
25.0000 [IU] | Freq: Every day | SUBCUTANEOUS | Status: DC
  Administered 2022-06-27: 25 [IU] via SUBCUTANEOUS
  Filled 2022-06-26: qty 0.25

## 2022-06-26 NOTE — Progress Notes (Signed)
PROGRESS NOTE    [name]  WUJ:[id number] DOB: 09/07/1985 DOA: 06/23/2022 PCP: Patient, [name]    Brief Narrative:  [name] is a 36 y.o. male with no known past medical history who presented with complaints of pain and swelling involving the left chest area.  He also developed fever chills.  Nausea vomiting.  Missed 2 days of work.  Patient reports that he has history of boils especially in his armpit area but nothing has been as severe as this particular infection.  Never had infection in the chest area previously.  Pain is currently 8 out of 10 in intensity.  Sharp pain without any radiation.  Associated symptoms as mentioned above.  Denies any drainage from the area of concern.  Denies any injuries to that area.  He has never been diagnosed with diabetes previously.  Denies any weight loss but he does admit he has gained some weight over the last few months.   In the emergency department patient was found to have swelling and erythema of the left chest area.  CT scan did not show any abscess.  Patient with IV antibiotics.  Still spiking temperatures.  Will get U/S to r/o abscesses on areas not seen by CT.   Assessment and Plan:  Left chest wall cellulitis, sepsis present on admission:  - no evidence for abscess noted on CT scan.  - CT scan raised concern for infection versus malignancy.   -Presentation is most consistent with infection.   -EDP discussed with Dr. Andrey CampanileWilson with general surgery.  There is a note from him in the chart.   -No fluctuation was appreciated on examination.  The area is quite erythematous.   -will get U/S to r/o abscess as still with fever -MRSA PCR negative.   - blood cultures NGTD - broad-spectrum coverage for now with vancomycin cefepime and metronidazole and de-escalate depending on clinical improvement and results of tests.   -small blister: WOC:    Hyperglycemia/newly diagnosed diabetes mellitus type 2: No known history of for diabetes.   -  11.6 HbA1c.  Monitor CBGs.   -diabetes coordinator and will need education -started long acting and SSI   Hyponatremia:  -Likely due to hypovolemia and sepsis.   -IV fluids.   -Recheck labs in the morning.    Alcohol abuse:  -Patient was counseled.  CIWA protocol.   Tobacco abuse:  -Patient was counseled.  Nicotine patch   DVT prophylaxis:     Code Status: Full Code   Disposition Plan:  Level of care: Progressive Status is: Inpatient Remains inpatient appropriate because: needs IV abx    Consultants:  GS (non-formal)-- note in chart   Subjective: Thinks he is feeling better  Objective: Vitals:   06/26/22 0604 06/26/22 0756 06/26/22 1000 06/26/22 1134  BP:  121/78  123/77  Pulse: (!) 117 (!) 115 (!) 113 (!) 109  Resp: 20 18  18   Temp: (!) 102.8 F (39.3 C) (!) 102.9 F (39.4 C) 99.3 F (37.4 C) 99.5 F (37.5 C)  TempSrc: Oral Oral  Oral  SpO2: 96% 97%  100%  Weight:      Height:        Intake/Output Summary (Last 24 hours) at 06/26/2022 1153 Last data filed at 06/26/2022 1000 Gross per 24 hour  Intake 2619.15 ml  Output 900 ml  Net 1719.15 ml   Filed Weights   06/23/22 1330  Weight: 111.1 kg    Examination:    General: Appearance:  Obese male in no acute distress     Lungs:     Clear to auscultation bilaterally, respirations unlabored  Heart:    Tachycardic. Normal rhythm. No murmurs, rubs, or gallops.   MS:   All extremities are intact.   Neurologic:   Awake, alert, oriented x 3. No apparent focal neurological           defect.            Data Reviewed: I have personally reviewed following labs and imaging studies  CBC: Recent Labs  Lab 06/23/22 1348 06/24/22 0036 06/25/22 0230 06/26/22 0136  WBC 21.2* 22.2* 23.1* 17.8*  NEUTROABS 18.2*  --   --   --   HGB 15.8 13.0 13.4 11.6*  HCT 45.2 37.8* 37.2* 32.6*  MCV 79.2* 81.1 77.5* 77.1*  PLT 311 273 278 278   Basic Metabolic Panel: Recent Labs  Lab 06/23/22 1348  06/24/22 0036 06/25/22 0230 06/26/22 0136  NA 128* 128* 133* 132*  K 4.2 3.8 3.9 3.5  CL 92* 99 99 104  CO2 22 21* 16* 18*  GLUCOSE 365* 260* 203* 199*  BUN 8 7 5* 7  CREATININE 1.06 0.98 1.04 0.99  CALCIUM 8.6* 8.1* 8.8* 7.8*  MG  --  1.9  --   --    GFR: Estimated Creatinine Clearance: 124.4 mL/min (by C-G formula based on SCr of 0.99 mg/dL). Liver Function Tests: No results for input(s): "AST", "ALT", "ALKPHOS", "BILITOT", "PROT", "ALBUMIN" in the last 168 hours. No results for input(s): "LIPASE", "AMYLASE" in the last 168 hours. No results for input(s): "AMMONIA" in the last 168 hours. Coagulation Profile: No results for input(s): "INR", "PROTIME" in the last 168 hours. Cardiac Enzymes: No results for input(s): "CKTOTAL", "CKMB", "CKMBINDEX", "TROPONINI" in the last 168 hours. BNP (last 3 results) No results for input(s): "PROBNP" in the last 8760 hours. HbA1C: Recent Labs    06/24/22 0036  HGBA1C 11.6*   CBG: Recent Labs  Lab 06/25/22 1138 06/25/22 1620 06/25/22 2132 06/26/22 0754 06/26/22 1139  GLUCAP 217* 159* 224* 195* 194*   Lipid Profile: No results for input(s): "CHOL", "HDL", "LDLCALC", "TRIG", "CHOLHDL", "LDLDIRECT" in the last 72 hours. Thyroid Function Tests: Recent Labs    06/24/22 0036  TSH 3.788   Anemia Panel: No results for input(s): "VITAMINB12", "FOLATE", "FERRITIN", "TIBC", "IRON", "RETICCTPCT" in the last 72 hours. Sepsis Labs: Recent Labs  Lab 06/23/22 1340 06/24/22 0036 06/25/22 0230  PROCALCITON  --  0.21 1.35  LATICACIDVEN 1.9  --   --     Recent Results (from the past 240 hour(s))  MRSA Next Gen by PCR, Nasal     Status: None   Collection Time: 06/23/22 12:36 AM   Specimen: Nasal Mucosa; Nasal Swab  Result Value Ref Range Status   MRSA by PCR Next Gen NOT DETECTED NOT DETECTED Final    Comment: (NOTE) The GeneXpert MRSA Assay (FDA approved for NASAL specimens only), is one component of a comprehensive MRSA colonization  surveillance program. It is not intended to diagnose MRSA infection nor to guide or monitor treatment for MRSA infections. Test performance is not FDA approved in patients less than 48 years old. Performed at [facility], [address], [place], [place] [address]   Blood culture (routine x 2)     Status: None (Preliminary result)   Collection Time: 06/23/22  1:40 PM   Specimen: BLOOD RIGHT ARM  Result Value Ref Range Status   Specimen Description BLOOD  RIGHT ARM  Final   Special Requests   Final    BOTTLES DRAWN AEROBIC AND ANAEROBIC Blood Culture results may not be optimal due to an excessive volume of blood received in culture bottles   Culture   Final    NO GROWTH 3 DAYS Performed at [facility], [address] [place], [place] [address]    Report Status PENDING  Incomplete  Blood culture (routine x 2)     Status: None (Preliminary result)   Collection Time: 06/23/22  4:10 PM   Specimen: BLOOD  Result Value Ref Range Status   Specimen Description BLOOD SITE NOT SPECIFIED  Final   Special Requests   Final    BOTTLES DRAWN AEROBIC AND ANAEROBIC Blood Culture results may not be optimal due to an excessive volume of blood received in culture bottles   Culture   Final    NO GROWTH 3 DAYS Performed at [facility], [address], [place], [place] [address]    Report Status PENDING  Incomplete  SARS Coronavirus 2 by RT PCR (hospital order, performed in [facility] hospital lab) *cepheid single result test* Anterior Nasal Swab     Status: None   Collection Time: 06/25/22  2:26 PM   Specimen: Anterior Nasal Swab  Result Value Ref Range Status   SARS Coronavirus 2 by RT PCR NEGATIVE NEGATIVE Final    Comment: (NOTE) SARS-CoV-2 target nucleic acids are NOT DETECTED.  The SARS-CoV-2 RNA is generally detectable in upper and lower respiratory specimens during the acute phase of infection. The lowest concentration of SARS-CoV-2 viral copies this assay can detect is  250 copies / mL. A negative result does not preclude SARS-CoV-2 infection and should not be used as the sole basis for treatment or other patient management decisions.  A negative result may occur with improper specimen collection / handling, submission of specimen other than nasopharyngeal swab, presence of viral mutation(s) within the areas targeted by this assay, and inadequate number of viral copies (<250 copies / mL). A negative result must be combined with clinical observations, patient history, and epidemiological information.  Fact Sheet for Patients:   RoadLapTop.co.za  Fact Sheet for Healthcare Providers: http://kim-miller.com/  This test is not yet approved or  cleared by the [place] FDA and has been authorized for detection and/or diagnosis of SARS-CoV-2 by FDA under an Emergency Use Authorization (EUA).  This EUA will remain in effect (meaning this test can be used) for the duration of the COVID-19 declaration under Section 564(b)(1) of the Act, 21 U.S.C. section 360bbb-3(b)(1), unless the authorization is terminated or revoked sooner.  Performed at [facility], [address], [place], [place] [address]          Radiology Studies: No results found.      Scheduled Meds:  enoxaparin (LOVENOX) injection  55 mg Subcutaneous Q24H   feeding supplement  237 mL Oral BID WC   folic acid  1 mg Oral Daily   insulin aspart  0-15 Units Subcutaneous TID WC   insulin aspart  0-5 Units Subcutaneous QHS   insulin glargine-yfgn  20 Units Subcutaneous Daily   multivitamin with minerals  1 tablet Oral Daily   nicotine  14 mg Transdermal Daily   polyethylene glycol  17 g Oral Daily   thiamine  100 mg Oral Daily   Or   thiamine  100 mg Intravenous Daily   Continuous Infusions:  sodium chloride 125 mL/hr at 06/26/22 0440  ceFEPime (MAXIPIME) IV 2 g (06/26/22 6606)   metronidazole 500 mg (06/26/22 0837)    vancomycin 1,250 mg (06/26/22 0447)     LOS: 3 days    Time spent: 45 minutes spent on chart review, discussion with nursing staff, consultants, updating family and interview/physical exam; more than 50% of that time was spent in counseling and/or coordination of care.    Joseph Art, DO [facility] Available via Epic secure chat 7am-7pm After these hours, please refer to coverage provider listed on [email or web address] 06/26/2022, 11:53 AM

## 2022-06-26 NOTE — Plan of Care (Signed)
  Problem: Education: Goal: Ability to describe self-care measures that may prevent or decrease complications (Diabetes Survival Skills Education) will improve Outcome: Progressing Goal: Individualized Educational Video(s) Outcome: Progressing   Problem: Coping: Goal: Ability to adjust to condition or change in health will improve Outcome: Progressing   Problem: Fluid Volume: Goal: Ability to maintain a balanced intake and output will improve Outcome: Progressing   Problem: Health Behavior/Discharge Planning: Goal: Ability to identify and utilize available resources and services will improve Outcome: Progressing Goal: Ability to manage health-related needs will improve Outcome: Progressing   Problem: Metabolic: Goal: Ability to maintain appropriate glucose levels will improve Outcome: Progressing   Problem: Nutritional: Goal: Maintenance of adequate nutrition will improve Outcome: Progressing Goal: Progress toward achieving an optimal weight will improve Outcome: Progressing   Problem: Skin Integrity: Goal: Risk for impaired skin integrity will decrease Outcome: Progressing   Problem: Tissue Perfusion: Goal: Adequacy of tissue perfusion will improve Outcome: Progressing   Problem: Clinical Measurements: Goal: Ability to avoid or minimize complications of infection will improve Outcome: Progressing   Problem: Skin Integrity: Goal: Skin integrity will improve Outcome: Progressing   Problem: Education: Goal: Knowledge of General Education information will improve Description: Including pain rating scale, medication(s)/side effects and non-pharmacologic comfort measures Outcome: Progressing   Problem: Health Behavior/Discharge Planning: Goal: Ability to manage health-related needs will improve Outcome: Progressing   Problem: Clinical Measurements: Goal: Ability to maintain clinical measurements within normal limits will improve Outcome: Progressing Goal: Will  remain free from infection Outcome: Progressing Goal: Diagnostic test results will improve Outcome: Progressing Goal: Respiratory complications will improve Outcome: Progressing Goal: Cardiovascular complication will be avoided Outcome: Progressing   Problem: Activity: Goal: Risk for activity intolerance will decrease Outcome: Progressing   Problem: Nutrition: Goal: Adequate nutrition will be maintained Outcome: Progressing   Problem: Coping: Goal: Level of anxiety will decrease Outcome: Progressing   Problem: Elimination: Goal: Will not experience complications related to bowel motility Outcome: Progressing Goal: Will not experience complications related to urinary retention Outcome: Progressing   Problem: Pain Managment: Goal: General experience of comfort will improve Outcome: Progressing   Problem: Safety: Goal: Ability to remain free from injury will improve Outcome: Progressing   Problem: Skin Integrity: Goal: Risk for impaired skin integrity will decrease Outcome: Progressing   

## 2022-06-26 NOTE — Inpatient Diabetes Management (Signed)
Inpatient Diabetes Program Recommendations  AACE/ADA: New Consensus Statement on Inpatient Glycemic Control (2015)  Target Ranges:  Prepandial:   less than 140 mg/dL      Peak postprandial:   less than 180 mg/dL (1-2 hours)      Critically ill patients:  140 - 180 mg/dL    Latest Reference Range & Units 06/25/22 07:29 06/25/22 11:38 06/25/22 16:20 06/25/22 21:32  Glucose-Capillary 70 - 99 mg/dL 629 (H)  5 units Novolog  15 units Semglee @0924  217 (H)  5 units Novolog  159 (H)  3 units Novolog  224 (H)  2 units Novolog   (H): Data is abnormally high  Latest Reference Range & Units 06/26/22 07:54 06/26/22 11:39  Glucose-Capillary 70 - 99 mg/dL 14/03/23 (H)  3 units Novolog  20 units Semglee @0838   194 (H)  3 units Novolog   (H): Data is abnormally high   Diabetes history: New onset DM   Current orders: Novolog 0-15 units TID & HS     Semglee 20 units Daily   MD- Note Semglee increased to 20 units Daily this AM  Still having some elevations after meal intake  May consider Statring low dose Novolog Meal Coverage: Novolog 3 units TID with meals HOLD if pt NPO HOLD if pt eats <50% meals  Plan to follow up with pt Monday 12/04   --Will follow patient during hospitalization--  Monday RN, MSN, CDCES Diabetes Coordinator Inpatient Glycemic Control Team Team Pager: [phone] (8a-5p)

## 2022-06-26 NOTE — Progress Notes (Signed)
Pharmacy Antibiotic Note  [name] is a 36 y.o. male admitted on 06/23/2022 with sepsis secondary to L chest/breast cellulitis and possible abscess.  Pharmacy  consulted 06/23/22 for Vancomycin  and Cefepime dosing.  Day # 4 Vanc/ Cefepime WBC 23.1>>17.8 improved, Tmax 103.6 early this AM 12/3.  Tc 99.5 SCr 1.06 >>0.99 today. No evidence for abscess noted on CT scan.  Dr. Benjamine Mola noted area is quite erythematous and plans to  get U/S to r/o abscess as still with fever -MRSA PCR negative.   - blood cultures NGTD Continuing broad-spectrum coverage for now with vancomycin cefepime and metronidazole and de-escalate depending on clinical improvement and results of tests.     Plan: Continue Vancomycin 1250mg  IV q12h (eAUC ~543)    > Goal AUC 400-550    > Check vancomycin levels at steady state  Continue Cefepime 2g IV q8h Monitor daily CBC, temp, SCr and for clinical signs of improvement  F/u cultures and de-escalate antibiotics as able   Height: 5\' 9"  (175.3 cm) Weight: 111.1 kg (245 lb) IBW/kg (Calculated) : 70.7  Temp (24hrs), Avg:101.7 F (38.7 C), Min:99.3 F (37.4 C), Max:103.6 F (39.8 C)  Recent Labs  Lab 06/23/22 1340 06/23/22 1348 06/24/22 0036 06/25/22 0230 06/26/22 0136  WBC  --  21.2* 22.2* 23.1* 17.8*  CREATININE  --  1.06 0.98 1.04 0.99  LATICACIDVEN 1.9  --   --   --   --      Estimated Creatinine Clearance: 124.4 mL/min (by C-G formula based on SCr of 0.99 mg/dL).    Allergies  Allergen Reactions   Peach [Prunus Persica]     Pt reports swelling and lip tingling as reaction    Antimicrobials this admission: Ceftriaxone 11/30 x1 Cefepime 11/30>> Vancomycin 11/30 >>  Flagyl 11/30>>  Dose adjustments this admission: N/A  Microbiology results: 11/30 Bld cx: ngtd 11/30 MRSA PCR negative 12/2  covid pcr: negative  Thank you for allowing pharmacy to be part of this patients care team.  12/30, RPh Clinical Pharmacist 06/26/2022 12:10  PM

## 2022-06-26 NOTE — Progress Notes (Signed)
ANTICOAGULATION CONSULT NOTE - Initial Consult  Pharmacy Consult for Lovenox  adjust dose for VTE prophylaxis Indication: VTE prophylaxis  Allergies  Allergen Reactions   Peach [Prunus Persica]     Pt reports swelling and lip tingling as reaction    Patient Measurements: Height: 5\' 9"  (175.3 cm) Weight: 111.1 kg (245 lb) IBW/kg (Calculated) : 70.7   Vital Signs: Temp: 99.5 F (37.5 C) (12/03 1134) Temp Source: Oral (12/03 1134) BP: 123/77 (12/03 1134) Pulse Rate: 109 (12/03 1134)  Labs: Recent Labs    06/24/22 0036 06/25/22 0230 06/26/22 0136  HGB 13.0 13.4 11.6*  HCT 37.8* 37.2* 32.6*  PLT 273 278 278  CREATININE 0.98 1.04 0.99    Estimated Creatinine Clearance: 124.4 mL/min (by C-G formula based on SCr of 0.99 mg/dL).   Medical History: Past Medical History:  Diagnosis Date   Asthma     Assessment: 36 y.o male.  Not taking anticoagulation prior to admission.  Lovenox 40mg  sq q24h started on admission for VTE prophylaxis.  Weight is 111 kg.  BMI is 36. Pharmacy may adjust dose for BMI >30 Pltc wnl, hgb 13.4>11.6 No bleeding reported   Goals of therapy:  VTE prevention Monitor platelets by anticoagulation protocol: Yes   Plan:  Increase Lovenox to 55 mg SQ every 24 hrs Monitor for s/sx of bleeding and PLTC.  Thank you for allowing pharmacy to be part of this patients care team. 20, RPh Clinical Pharmacist 06/26/2022,11:52 AM

## 2022-06-26 NOTE — Progress Notes (Signed)
TR[facility] night cross cover note:   I was notified by RN that patient's temp is 102.8 following dose of prn acetaminophen 650 mg po.  Cold compresses are being applied and I will escalate the dose of existing prn acetaminophen order to 1 g p.o. every 6 hours as needed for fever, noting no known history of underlying liver disease.    Caleb Pigg, DO Hospitalist

## 2022-06-26 NOTE — Plan of Care (Signed)
  Problem: Education: Goal: Ability to describe self-care measures that may prevent or decrease complications (Diabetes Survival Skills Education) will improve Outcome: Progressing Goal: Individualized Educational Video(s) Outcome: Progressing   Problem: Coping: Goal: Ability to adjust to condition or change in health will improve Outcome: Progressing   Problem: Fluid Volume: Goal: Ability to maintain a balanced intake and output will improve Outcome: Progressing   Problem: Health Behavior/Discharge Planning: Goal: Ability to identify and utilize available resources and services will improve Outcome: Progressing Goal: Ability to manage health-related needs will improve Outcome: Progressing   Problem: Metabolic: Goal: Ability to maintain appropriate glucose levels will improve Outcome: Progressing   Problem: Nutritional: Goal: Maintenance of adequate nutrition will improve Outcome: Progressing Goal: Progress toward achieving an optimal weight will improve Outcome: Progressing   Problem: Skin Integrity: Goal: Risk for impaired skin integrity will decrease Outcome: Progressing   Problem: Tissue Perfusion: Goal: Adequacy of tissue perfusion will improve Outcome: Progressing   Problem: Clinical Measurements: Goal: Ability to avoid or minimize complications of infection will improve Outcome: Progressing   Problem: Skin Integrity: Goal: Skin integrity will improve Outcome: Progressing   Problem: Education: Goal: Knowledge of General Education information will improve Description: Including pain rating scale, medication(s)/side effects and non-pharmacologic comfort measures Outcome: Progressing   Problem: Clinical Measurements: Goal: Ability to maintain clinical measurements within normal limits will improve Outcome: Progressing Goal: Will remain free from infection Outcome: Progressing Goal: Diagnostic test results will improve Outcome: Progressing Goal: Respiratory  complications will improve Outcome: Progressing Goal: Cardiovascular complication will be avoided Outcome: Progressing   Problem: Activity: Goal: Risk for activity intolerance will decrease Outcome: Progressing   Problem: Nutrition: Goal: Adequate nutrition will be maintained Outcome: Progressing   Problem: Coping: Goal: Level of anxiety will decrease Outcome: Progressing   Problem: Elimination: Goal: Will not experience complications related to bowel motility Outcome: Progressing Goal: Will not experience complications related to urinary retention Outcome: Progressing   Problem: Pain Managment: Goal: General experience of comfort will improve Outcome: Progressing   Problem: Safety: Goal: Ability to remain free from injury will improve Outcome: Progressing   Problem: Skin Integrity: Goal: Risk for impaired skin integrity will decrease Outcome: Progressing

## 2022-06-26 NOTE — Progress Notes (Signed)
TR[facility] night cross cover note:   I was notified by RN of persistent fever of nearly 103 following higher dose of acetaminophen as well as implementation of convective heat loss options,  including cold packs, fan.  May benefit from a cooling blanket, but it appears that this would require higher level of care.  Will place order to change level of care to PCU and ordered initiation of cooling blanket once this high level of care request becomes effective.     Newton Pigg, DO Hospitalist

## 2022-06-27 ENCOUNTER — Encounter (HOSPITAL_COMMUNITY): Payer: Self-pay | Admitting: Internal Medicine

## 2022-06-27 ENCOUNTER — Other Ambulatory Visit (HOSPITAL_COMMUNITY): Payer: Self-pay

## 2022-06-27 DIAGNOSIS — L03313 Cellulitis of chest wall: Secondary | ICD-10-CM | POA: Diagnosis not present

## 2022-06-27 LAB — CBC
HCT: 30.7 % — ABNORMAL LOW (ref 39.0–52.0)
Hemoglobin: 11.3 g/dL — ABNORMAL LOW (ref 13.0–17.0)
MCH: 27.7 pg (ref 26.0–34.0)
MCHC: 36.8 g/dL — ABNORMAL HIGH (ref 30.0–36.0)
MCV: 75.2 fL — ABNORMAL LOW (ref 80.0–100.0)
Platelets: 318 10*3/uL (ref 150–400)
RBC: 4.08 MIL/uL — ABNORMAL LOW (ref 4.22–5.81)
RDW: 13.1 % (ref 11.5–15.5)
WBC: 20.3 10*3/uL — ABNORMAL HIGH (ref 4.0–10.5)
nRBC: 0 % (ref 0.0–0.2)

## 2022-06-27 LAB — BASIC METABOLIC PANEL
Anion gap: 10 (ref 5–15)
BUN: 8 mg/dL (ref 6–20)
CO2: 18 mmol/L — ABNORMAL LOW (ref 22–32)
Calcium: 7.8 mg/dL — ABNORMAL LOW (ref 8.9–10.3)
Chloride: 103 mmol/L (ref 98–111)
Creatinine, Ser: 0.88 mg/dL (ref 0.61–1.24)
GFR, Estimated: >60 mL/min (ref >60)
Glucose, Bld: 202 mg/dL — ABNORMAL HIGH (ref 70–99)
Potassium: 3.4 mmol/L — ABNORMAL LOW (ref 3.5–5.1)
Sodium: 131 mmol/L — ABNORMAL LOW (ref 135–145)

## 2022-06-27 LAB — GLUCOSE, CAPILLARY
Glucose-Capillary: 176 mg/dL — ABNORMAL HIGH (ref 70–99)
Glucose-Capillary: 192 mg/dL — ABNORMAL HIGH (ref 70–99)
Glucose-Capillary: 210 mg/dL — ABNORMAL HIGH (ref 70–99)
Glucose-Capillary: 199 mg/dL — ABNORMAL HIGH (ref 70–99)

## 2022-06-27 MED ORDER — POTASSIUM CHLORIDE CRYS ER 20 MEQ PO TBCR
40.0000 meq | EXTENDED_RELEASE_TABLET | Freq: Once | ORAL | Status: AC
  Administered 2022-06-27: 40 meq via ORAL
  Filled 2022-06-27: qty 2

## 2022-06-27 MED ORDER — METOCLOPRAMIDE HCL 5 MG/ML IJ SOLN
5.0000 mg | Freq: Four times a day (QID) | INTRAMUSCULAR | Status: DC | PRN
  Administered 2022-06-28: 5 mg via INTRAVENOUS
  Filled 2022-06-27: qty 2

## 2022-06-27 MED ORDER — INSULIN ASPART PROT & ASPART (70-30 MIX) 100 UNIT/ML ~~LOC~~ SUSP
16.0000 [IU] | Freq: Two times a day (BID) | SUBCUTANEOUS | Status: DC
  Administered 2022-06-29 – 2022-07-03 (×5): 16 [IU] via SUBCUTANEOUS
  Filled 2022-06-27: qty 10

## 2022-06-27 NOTE — Progress Notes (Addendum)
Mobility Specialist Progress Note    06/27/22 1435  Mobility  Activity Ambulated independently in hallway  Level of Assistance Standby assist, set-up cues, supervision of patient - no hands on  Assistive Device None  Distance Ambulated (ft) 300 ft  Activity Response Tolerated fair  Mobility Referral Yes  $Mobility charge 1 Mobility   Pre-Mobility: 118 HR During Mobility: 135 HR Post-Mobility: 128 HR  Pt received sitting EOB and agreeable. C/o not feeling well. Left standing in room w/ NT present.  [place] Mobility Specialist  Please Neurosurgeon or Rehab Office at [phone]

## 2022-06-27 NOTE — Progress Notes (Signed)
With minimal drainage to left breast. Dressing reinforced.

## 2022-06-27 NOTE — Progress Notes (Addendum)
PROGRESS NOTE    [name]  XBD:[id number] DOB: 11-Aug-1985 DOA: 06/23/2022 PCP: Patient, [name]    Brief Narrative:  [name] is a 36 y.o. male with no known past medical history who presented with complaints of pain and swelling involving the left chest area.  He also developed fever chills.  Nausea vomiting.  Missed 2 days of work.  Patient reports that he has history of boils especially in his armpit area but nothing has been as severe as this particular infection.  Never had infection in the chest area previously.  Pain is currently 8 out of 10 in intensity.  Sharp pain without any radiation.  Associated symptoms as mentioned above.  Denies any drainage from the area of concern.  Denies any injuries to that area.  He has never been diagnosed with diabetes previously.  Denies any weight loss but he does admit he has gained some weight over the last few months.   In the emergency department patient was found to have swelling and erythema of the left chest area.  CT scan did not show any abscess.  Patient with IV antibiotics.  Still spiking temperatures.  Will get U/S to r/o abscesses on areas not seen by CT.   Assessment and Plan:  Left chest wall cellulitis, sepsis present on admission:  - no evidence for abscess noted on CT scan.  - CT scan raised concern for infection versus malignancy.   -Presentation is most consistent with infection.   -EDP discussed with Dr. Andrey Campanile with general surgery.  There is a note from him in the chart.   -will get U/S to r/o abscess as still with fever- U/S with possible abscess-- recommending U/S guided aspiration-- IR consult placed -MRSA PCR negative.   - blood cultures NGTD - broad-spectrum coverage for now with vancomycin cefepime and metronidazole and de-escalate depending on clinical improvement and results of tests.   -WOC consulted   Hyperglycemia/newly diagnosed diabetes mellitus type 2: No known history of for diabetes.   - 11.6  HbA1c.  Monitor CBGs.   -diabetes coordinator and will need education -started long acting and SSI   Hyponatremia:  -Likely due to hypovolemia and sepsis.   -IV fluids.   -Recheck labs in the morning.    Alcohol abuse:  -Patient was counseled.  CIWA protocol.   Tobacco abuse:  -Patient was counseled.  Nicotine patch   DVT prophylaxis:     Code Status: Full Code   Disposition Plan:  Level of care: Progressive Status is: Inpatient Remains inpatient appropriate because: needs IV abx    Consultants:  GS (non-formal)-- note in chart WOC   Subjective: Vomited x 1 today  Objective: Vitals:   06/26/22 1952 06/27/22 0000 06/27/22 0430 06/27/22 0735  BP: 124/80 135/84 (!) 148/88 138/85  Pulse: (!) 117 (!) 120 (!) 103 (!) 105  Resp: 20 20 17  (!) 21  Temp: 98.4 F (36.9 C) (!) 100.6 F (38.1 C) 98.6 F (37 C) 99.7 F (37.6 C)  TempSrc: Oral Oral Oral Oral  SpO2: 95% 94% 96%   Weight:      Height:        Intake/Output Summary (Last 24 hours) at 06/27/2022 0840 Last data filed at 06/27/2022 0533 Gross per 24 hour  Intake 1418.12 ml  Output --  Net 1418.12 ml   Filed Weights   06/23/22 1330  Weight: 111.1 kg    Examination:     General: Appearance:    Obese  male in no acute distress     Lungs:     respirations unlabored  Heart:    Tachycardic.   MS:   All extremities are intact.   Neurologic:   Awake, alert             Data Reviewed: I have personally reviewed following labs and imaging studies  CBC: Recent Labs  Lab 06/23/22 1348 06/24/22 0036 06/25/22 0230 06/26/22 0136 06/27/22 0020  WBC 21.2* 22.2* 23.1* 17.8* 20.3*  NEUTROABS 18.2*  --   --   --   --   HGB 15.8 13.0 13.4 11.6* 11.3*  HCT 45.2 37.8* 37.2* 32.6* 30.7*  MCV 79.2* 81.1 77.5* 77.1* 75.2*  PLT 311 273 278 278 318   Basic Metabolic Panel: Recent Labs  Lab 06/23/22 1348 06/24/22 0036 06/25/22 0230 06/26/22 0136 06/27/22 0020  NA 128* 128* 133* 132* 131*  K 4.2  3.8 3.9 3.5 3.4*  CL 92* 99 99 104 103  CO2 22 21* 16* 18* 18*  GLUCOSE 365* 260* 203* 199* 202*  BUN 8 7 5* 7 8  CREATININE 1.06 0.98 1.04 0.99 0.88  CALCIUM 8.6* 8.1* 8.8* 7.8* 7.8*  MG  --  1.9  --   --   --    GFR: Estimated Creatinine Clearance: 139.9 mL/min (by C-G formula based on SCr of 0.88 mg/dL). Liver Function Tests: No results for input(s): "AST", "ALT", "ALKPHOS", "BILITOT", "PROT", "ALBUMIN" in the last 168 hours. No results for input(s): "LIPASE", "AMYLASE" in the last 168 hours. No results for input(s): "AMMONIA" in the last 168 hours. Coagulation Profile: No results for input(s): "INR", "PROTIME" in the last 168 hours. Cardiac Enzymes: No results for input(s): "CKTOTAL", "CKMB", "CKMBINDEX", "TROPONINI" in the last 168 hours. BNP (last 3 results) No results for input(s): "PROBNP" in the last 8760 hours. HbA1C: No results for input(s): "HGBA1C" in the last 72 hours.  CBG: Recent Labs  Lab 06/26/22 0754 06/26/22 1139 06/26/22 1629 06/26/22 2103 06/27/22 0615  GLUCAP 195* 194* 187* 200* 176*   Lipid Profile: No results for input(s): "CHOL", "HDL", "LDLCALC", "TRIG", "CHOLHDL", "LDLDIRECT" in the last 72 hours. Thyroid Function Tests: No results for input(s): "TSH", "T4TOTAL", "FREET4", "T3FREE", "THYROIDAB" in the last 72 hours.  Anemia Panel: No results for input(s): "VITAMINB12", "FOLATE", "FERRITIN", "TIBC", "IRON", "RETICCTPCT" in the last 72 hours. Sepsis Labs: Recent Labs  Lab 06/23/22 1340 06/24/22 0036 06/25/22 0230  PROCALCITON  --  0.21 1.35  LATICACIDVEN 1.9  --   --     Recent Results (from the past 240 hour(s))  MRSA Next Gen by PCR, Nasal     Status: None   Collection Time: 06/23/22 12:36 AM   Specimen: Nasal Mucosa; Nasal Swab  Result Value Ref Range Status   MRSA by PCR Next Gen NOT DETECTED NOT DETECTED Final    Comment: (NOTE) The GeneXpert MRSA Assay (FDA approved for NASAL specimens only), is one component of a  comprehensive MRSA colonization surveillance program. It is not intended to diagnose MRSA infection nor to guide or monitor treatment for MRSA infections. Test performance is not FDA approved in patients less than 48 years old. Performed at [facility], [address], [place], [place] [address]   Blood culture (routine x 2)     Status: None (Preliminary result)   Collection Time: 06/23/22  1:40 PM   Specimen: BLOOD RIGHT ARM  Result Value Ref Range Status   Specimen Description BLOOD RIGHT ARM  Final  Special Requests   Final    BOTTLES DRAWN AEROBIC AND ANAEROBIC Blood Culture results may not be optimal due to an excessive volume of blood received in culture bottles   Culture   Final    NO GROWTH 3 DAYS Performed at [facility], [address], [place], [place] [address]    Report Status PENDING  Incomplete  Blood culture (routine x 2)     Status: None (Preliminary result)   Collection Time: 06/23/22  4:10 PM   Specimen: BLOOD  Result Value Ref Range Status   Specimen Description BLOOD SITE NOT SPECIFIED  Final   Special Requests   Final    BOTTLES DRAWN AEROBIC AND ANAEROBIC Blood Culture results may not be optimal due to an excessive volume of blood received in culture bottles   Culture   Final    NO GROWTH 3 DAYS Performed at [facility], [address], [place], [place] [address]    Report Status PENDING  Incomplete  SARS Coronavirus 2 by RT PCR (hospital order, performed in [facility] hospital lab) *cepheid single result test* Anterior Nasal Swab     Status: None   Collection Time: 06/25/22  2:26 PM   Specimen: Anterior Nasal Swab  Result Value Ref Range Status   SARS Coronavirus 2 by RT PCR NEGATIVE NEGATIVE Final    Comment: (NOTE) SARS-CoV-2 target nucleic acids are NOT DETECTED.  The SARS-CoV-2 RNA is generally detectable in upper and lower respiratory specimens during the acute phase of infection. The lowest concentration of SARS-CoV-2 viral  copies this assay can detect is 250 copies / mL. A negative result does not preclude SARS-CoV-2 infection and should not be used as the sole basis for treatment or other patient management decisions.  A negative result may occur with improper specimen collection / handling, submission of specimen other than nasopharyngeal swab, presence of viral mutation(s) within the areas targeted by this assay, and inadequate number of viral copies (<250 copies / mL). A negative result must be combined with clinical observations, patient history, and epidemiological information.  Fact Sheet for Patients:   RoadLapTop.co.za  Fact Sheet for Healthcare Providers: http://kim-miller.com/  This test is not yet approved or  cleared by the [place] FDA and has been authorized for detection and/or diagnosis of SARS-CoV-2 by FDA under an Emergency Use Authorization (EUA).  This EUA will remain in effect (meaning this test can be used) for the duration of the COVID-19 declaration under Section 564(b)(1) of the Act, 21 U.S.C. section 360bbb-3(b)(1), unless the authorization is terminated or revoked sooner.  Performed at [facility], [address], [place], [place] [address]          Radiology Studies: No results found.      Scheduled Meds:  enoxaparin (LOVENOX) injection  55 mg Subcutaneous Q24H   feeding supplement  237 mL Oral BID WC   folic acid  1 mg Oral Daily   insulin aspart  0-15 Units Subcutaneous TID WC   insulin aspart  0-5 Units Subcutaneous QHS   insulin aspart  3 Units Subcutaneous TID WC   insulin glargine-yfgn  25 Units Subcutaneous Daily   multivitamin with minerals  1 tablet Oral Daily   nicotine  14 mg Transdermal Daily   polyethylene glycol  17 g Oral Daily   potassium chloride  40 mEq Oral Once   thiamine  100 mg Oral Daily   Or   thiamine  100 mg Intravenous  Daily   Continuous Infusions:  ceFEPime (MAXIPIME)  IV 2 g (06/27/22 [id number])   metronidazole Stopped (06/26/22 2151)   vancomycin 166.7 mL/hr at 06/27/22 0533     LOS: 4 days    Time spent: 45 minutes spent on chart review, discussion with nursing staff, consultants, updating family and interview/physical exam; more than 50% of that time was spent in counseling and/or coordination of care.    Joseph ArtJessica U Fatisha Rabalais, DO [facility] Available via Epic secure chat 7am-7pm After these hours, please refer to coverage provider listed on [email or web address] 06/27/2022, 8:40 AM

## 2022-06-27 NOTE — Progress Notes (Signed)
Left breast and chest dressing changed , xeroform applied, covered with abd gauze. Dressing saturated with pale yellow drainage, continue to monitor.

## 2022-06-27 NOTE — Plan of Care (Signed)
  Problem: Coping: Goal: Ability to adjust to condition or change in health will improve Outcome: Progressing   Problem: Fluid Volume: Goal: Ability to maintain a balanced intake and output will improve Outcome: Progressing   Problem: Health Behavior/Discharge Planning: Goal: Ability to identify and utilize available resources and services will improve Outcome: Progressing Goal: Ability to manage health-related needs will improve Outcome: Progressing   Problem: Metabolic: Goal: Ability to maintain appropriate glucose levels will improve Outcome: Progressing   Problem: Nutritional: Goal: Maintenance of adequate nutrition will improve Outcome: Progressing Goal: Progress toward achieving an optimal weight will improve Outcome: Progressing   Problem: Tissue Perfusion: Goal: Adequacy of tissue perfusion will improve Outcome: Progressing   Problem: Health Behavior/Discharge Planning: Goal: Ability to manage health-related needs will improve Outcome: Progressing   Problem: Clinical Measurements: Goal: Ability to maintain clinical measurements within normal limits will improve Outcome: Progressing Goal: Respiratory complications will improve Outcome: Progressing Goal: Cardiovascular complication will be avoided Outcome: Progressing   Problem: Activity: Goal: Risk for activity intolerance will decrease Outcome: Progressing   Problem: Nutrition: Goal: Adequate nutrition will be maintained Outcome: Progressing   Problem: Coping: Goal: Level of anxiety will decrease Outcome: Progressing   Problem: Elimination: Goal: Will not experience complications related to urinary retention Outcome: Progressing   Problem: Pain Managment: Goal: General experience of comfort will improve Outcome: Progressing   Problem: Safety: Goal: Ability to remain free from injury will improve Outcome: Progressing

## 2022-06-27 NOTE — Consult Note (Signed)
Chief Complaint: Patient was seen in consultation today for left chest abscess  Referring Physician(s): Dr. Eulogio Bear  Supervising Physician: Aletta Edouard  Patient Status: [facility] - In-pt  History of Present Illness: [name] is a 36 y.o. male with history of asthma and hydradenitis vs. Boils who presented to [facility] ED with pain and swelling involving left chest.  A CT performed in the ED did not discrete abscess on admission, however despite IV antibiotics he continues to spike intermittent fevers, endorse pain as well as drainage from the left chest.  Patient states typically he gets relief when there is drainage, however he states that he continues to feel fullness and pain in the left chest despite drainage since yesterday.  US performed yesterday showed [id number] complicated fluid collection.  IR consulted for aspiration/drainage.   Past Medical History:  Diagnosis Date   Asthma     History reviewed. No pertinent surgical history.  Allergies: Peach [prunus persica]  Medications: Prior to Admission medications   Medication Sig Start Date End Date Taking? Authorizing Provider  ibuprofen (ADVIL) 200 MG tablet Take 200 mg by mouth every 6 (six) hours as needed for mild pain.   Yes [provider]  naproxen (NAPROSYN) 500 MG tablet Take 1 tablet (500 mg total) by mouth 2 (two) times daily. Patient not taking: Reported on 06/23/2022 08/07/16   Gloriann Loan, PA-C     History reviewed. No pertinent family history.  Social History   Socioeconomic History   Marital status: Single    Spouse name: Not on file   Number of children: Not on file   Years of education: Not on file   Highest education level: Not on file  Occupational History   Not on file  Tobacco Use   Smoking status: Every Day    Packs/day: 1.00    Types: Cigarettes   Smokeless tobacco: Never  Substance and Sexual Activity   Alcohol use: Yes    Alcohol/week: 13.0 standard drinks of alcohol     Types: 9 Cans of beer, 4 Shots of liquor per week   Drug use: Yes    Types: Marijuana   Sexual activity: Yes  Other Topics Concern   Not on file  Social History Narrative   Not on file   Social Determinants of Health   Financial Resource Strain: Not on file  Food Insecurity: Not on file  Transportation Needs: Not on file  Physical Activity: Not on file  Stress: Not on file  Social Connections: Not on file     Review of Systems: A 12 point ROS discussed and pertinent positives are indicated in the HPI above.  All other systems are negative.  Review of Systems  Constitutional:  Positive for fatigue and fever.  Cardiovascular:  Negative for chest pain.  Gastrointestinal:  Negative for abdominal pain, diarrhea, nausea and vomiting.  Musculoskeletal:  Negative for back pain.  Skin:  Positive for wound (Left outer chest/breast).  Psychiatric/Behavioral:  Negative for behavioral problems and confusion.     Vital Signs: BP 138/71 (BP Location: Left Arm)   Pulse (!) 112   Temp 100.1 F (37.8 C) (Oral)   Resp 18   Ht 5\' 9"  (1.753 m)   Wt 245 lb (111.1 kg)   SpO2 96%   BMI 36.18 kg/m   Physical Exam Vitals and nursing note reviewed.  Constitutional:      General: He is not in acute distress.    Appearance: Normal appearance. He is  not ill-appearing.  HENT:     Mouth/Throat:     Mouth: Mucous membranes are moist.     Pharynx: Oropharynx is clear.  Cardiovascular:     Rate and Rhythm: Normal rate and regular rhythm.  Pulmonary:     Effort: Pulmonary effort is normal.     Breath sounds: Normal breath sounds.  Abdominal:     General: Abdomen is flat.     Palpations: Abdomen is soft.  Skin:    Findings: Lesion (erythema, swelling, and (+) purulent appearing drainage from the left chest wall/outer breast.) present.  Neurological:     Mental Status: He is alert.      MD Evaluation Airway: WNL Heart: WNL Abdomen: WNL Chest/ Lungs: WNL ASA  Classification:  3 Mallampati/Airway Score: Two   Imaging: [place] AXILLA LEFT  Result Date: 06/27/2022 CLINICAL DATA:  Swelling left axilla in 2 lower outer left breast/chest wall towards the inframammary fold. Possible infection. EXAM: ULTRASOUND OF THE LEFT BREAST COMPARISON:  None available. FINDINGS: Examination was performed by the ultrasound technologist without breast imaging radiologist present. No corresponding mammographic evaluation for correlation. Targeted ultrasound is performed, showing an irregular shaped complicated fluid collection over the 5-6 o'clock position of the left breast towards the inframammary fold measuring approximately 2.1 x 2.6 x 6.1 cm. Mobile fluid is visualized within this collection on the sonographic images. Correlating with patient's presenting history, this likely represents an abscess. There is associated subcutaneous edema. Possible mild associated skin thickening. IMPRESSION: 6.1 cm complicated fluid collection over the 5-6 o'clock position of the left breast towards the inframammary fold likely representing an abscess. RECOMMENDATION: Recommend completion of patient's diagnostic workup with left breast mammogram when feasible. Also recommend ultrasound-guided aspiration of this presumed abscess and completion of antibiotic course. If abscess confirmed on aspiration, then would recommend follow-up ultrasound 1-2 weeks. If purulent material unable to be aspirated, would recommend converting procedure to ultrasound-guided core needle biopsy. I have discussed the findings and recommendations with the patient. If applicable, a reminder letter will be sent to the patient regarding the next appointment. BI-RADS CATEGORY  3: Probably benign. These results will be called to the ordering clinician or representative by the Radiologist Assistant, and communication documented in the PACS or [employer]. Electronically Signed   By: Elberta Fortis M.D.   On: 06/27/2022 11:39   CT Chest W  Contrast  Result Date: 06/23/2022 CLINICAL DATA:  Chest wall pain, nontraumatic, infection or inflammation suspected, xray done EXAM: CT CHEST WITH CONTRAST TECHNIQUE: Multidetector CT imaging of the chest was performed during intravenous contrast administration. RADIATION DOSE REDUCTION: This exam was performed according to the departmental dose-optimization program which includes automated exposure control, adjustment of the mA and/or kV according to patient size and/or use of iterative reconstruction technique. CONTRAST:  47mL OMNIPAQUE IOHEXOL 350 MG/ML SOLN COMPARISON:  None Available. FINDINGS: Cardiovascular: Normal heart size. No significant pericardial effusion. The thoracic aorta is normal in caliber. No atherosclerotic plaque of the thoracic aorta. No coronary artery calcifications. Mediastinum/Nodes: Borderline enlarged left axillary lymph nodes. No enlarged mediastinal, hilar, or right axillary lymph nodes. Thyroid gland, trachea, and esophagus demonstrate no significant findings. Lungs/Pleura: No focal consolidation. No pulmonary nodule. No pulmonary mass. No pleural effusion. No pneumothorax. Upper Abdomen: No acute abnormality. Musculoskeletal: Collimated off view left breast with associated dermal thickening and subcutaneus soft tissue edema. No suspicious lytic or blastic osseous lesions. No acute displaced fracture. Bulky osteophyte formation of the T11-T12 levels. IMPRESSION: 1. Collimated off view left  breast with associated dermal thickening and subcutaneus soft tissue edema. Finding could represent infection versus malignancy. 2. Borderline enlarged left axillary lymph nodes. Consider diagnostic mammography/ultrasound for further evaluation. 3. No acute intrathoracic abnormality. Electronically Signed   By: Iven Finn M.D.   On: 06/23/2022 17:47    Labs:  CBC: Recent Labs    06/24/22 0036 06/25/22 0230 06/26/22 0136 06/27/22 0020  WBC 22.2* 23.1* 17.8* 20.3*  HGB 13.0 13.4  11.6* 11.3*  HCT 37.8* 37.2* 32.6* 30.7*  PLT 273 278 278 318    COAGS: No results for input(s): "INR", "APTT" in the last 8760 hours.  BMP: Recent Labs    06/24/22 0036 06/25/22 0230 06/26/22 0136 06/27/22 0020  NA 128* 133* 132* 131*  K 3.8 3.9 3.5 3.4*  CL 99 99 104 103  CO2 21* 16* 18* 18*  GLUCOSE 260* 203* 199* 202*  BUN 7 5* 7 8  CALCIUM 8.1* 8.8* 7.8* 7.8*  CREATININE 0.98 1.04 0.99 0.88  GFRNONAA >60 >60 >60 >60    LIVER FUNCTION TESTS: No results for input(s): "BILITOT", "AST", "ALT", "ALKPHOS", "PROT", "ALBUMIN" in the last 8760 hours.  TUMOR MARKERS: No results for input(s): "AFPTM", "CEA", "CA199", "CHROMGRNA" in the last 8760 hours.  Assessment and Plan: Left breast/outer chest wall abscess Patient with history of boils vs. Hydradentitis presents with painful swelling, concern for abscess of the left chest/outer breast.  CT on admission did not show discrete abscess, however drainage started yesterday and US shows undrained fluid collection.  IR consulted for aspiration.  Case reviewed by Dr. Kathlene Cote who recommends proceeding with aspiration only of presumed abscess.  Patient assessed at bedside. NPO p MN.   Risks and benefits of aspiration was discussed with the patient and/or patient's family including, but not limited to bleeding, infection, damage to adjacent structures or low yield requiring additional tests.  All of the questions were answered and there is agreement to proceed.  Consent signed and in chart.  Thank you for this interesting consult.  I greatly enjoyed meeting [name] and look forward to participating in their care.  A copy of this report was sent to the requesting provider on this date.  Electronically Signed: Docia Barrier, PA 06/27/2022, 3:01 PM   I spent a total of 40 Minutes    in face to face in clinical consultation, greater than 50% of which was counseling/coordinating care for cellulitis.

## 2022-06-27 NOTE — Consult Note (Addendum)
WOC Nurse Consult Note: Reason for Consult: Consult requested for left chest cellulitis; performed remotely after review of progress notes and photos in the EMR.  Previously noted cellulitis has evolved into a large clear fluid filled bulla which is beginning to rupture and peel at the edges, evolving into partial thickness skin loss.  Dressing procedure/placement/frequency: Topical treatment orders provided for bedside nurses to perform as follows to promote drying and healing: Apply xeroform gauze to chest blistered area Q day, then cover with ABD pad and tape or foam dressing (change foam dressing Q 3 days or PRN soiling. Please re-consult if further assistance is needed.  Thank-you,  Cammie Mcgee MSN, RN, CWOCN, [place], CNS [phone]

## 2022-06-27 NOTE — Progress Notes (Signed)
   06/27/22 1625  Assess: MEWS Score  Temp (!) 101.8 F (38.8 C)  BP (!) 123/90  MAP (mmHg) 99  ECG Heart Rate (!) 122  Resp 19  O2 Device Room Air  Assess: MEWS Score  MEWS Temp 2  MEWS Systolic 0  MEWS Pulse 2  MEWS RR 0  MEWS LOC 0  MEWS Score 4  MEWS Score Color Red  Assess: if the MEWS score is Yellow or Red  Were vital signs taken at a resting state? Yes  Focused Assessment No change from prior assessment  Does the patient meet 2 or more of the SIRS criteria? Yes  Does the patient have a confirmed or suspected source of infection? Yes  Provider and Rapid Response Notified? Yes  MEWS guidelines implemented *See Row Information* Yes  Take Vital Signs  Increase Vital Sign Frequency  Red: Q 1hr X 4 then Q 4hr X 4, if remains red, continue Q 4hrs  Notify: Charge Nurse/RN  Name of Charge Nurse/RN Notified kristy  Date Charge Nurse/RN Notified 06/27/22  Time Charge Nurse/RN Notified 1710  Provider Notification  Provider Name/Title dr. Benjamine Mola  Date Provider Notified 06/27/22  Time Provider Notified 1615  Method of Notification  (messaged in secre chat)  Notification Reason Critical Result  Provider response No new orders  Date of Provider Response 06/27/22  Time of Provider Response 1616  Document  Patient Outcome Not stable and remains on department  Progress note created (see row info) Yes  Assess: SIRS CRITERIA  SIRS Temperature  1  SIRS Pulse 1  SIRS Respirations  0  SIRS WBC 1  SIRS Score Sum  3

## 2022-06-27 NOTE — Inpatient Diabetes Management (Addendum)
Inpatient Diabetes Program Recommendations  AACE/ADA: New Consensus Statement on Inpatient Glycemic Control (2015)  Target Ranges:  Prepandial:   less than 140 mg/dL      Peak postprandial:   less than 180 mg/dL (1-2 hours)      Critically ill patients:  140 - 180 mg/dL   Lab Results  Component Value Date   GLUCAP 192 (H) 06/27/2022   HGBA1C 11.6 (H) 06/24/2022    Review of Glycemic Control  Latest Reference Range & Units 06/26/22 07:54 06/26/22 11:39 06/26/22 16:29 06/26/22 21:03 06/27/22 06:15 06/27/22 11:29  Glucose-Capillary 70 - 99 mg/dL 191 (H) 478 (H) 295 (H) 200 (H) 176 (H) 192 (H)  (H): Data is abnormally high  Diabetes history: New DM2  Current orders for Inpatient glycemic control:  Semglee 25 QD Novolog 0-15 units TID and 0-5 QHS Novolog 3 units TID with meals   Inpatient Diabetes Program Recommendations:    He will need affordable insulin at discharge.  Basal/bolus insulins will be >$140/mo per pharmacy benefit check.  CGM will need prior authorization.    Please consider 70/30 16 units BID (this is a total of 22 units basal QD & 9 units MC BID).  This can start tomorrow with breakfast.   Spoke with patient at bedside.  He is in agreement with this insulin as he cannot afford basal bolus insulins. Educated him on 70/30 insulin and when to administer.  He can purchase a box of 5 insulin pens for $43.  Will follow up with him tomorrow.   Novolin ReliOn 70/30-order # [id number] Insulin pen needles-order # [id number] Glucometer order # [id number]   Will continue to follow while inpatient.  Thank you, Dulce Sellar, MSN, CDCES Diabetes Coordinator Inpatient Diabetes Program [phone] (team pager from 8a-5p)

## 2022-06-28 ENCOUNTER — Encounter (HOSPITAL_COMMUNITY): Payer: Self-pay | Admitting: Internal Medicine

## 2022-06-28 ENCOUNTER — Inpatient Hospital Stay (HOSPITAL_COMMUNITY): Payer: No Typology Code available for payment source

## 2022-06-28 DIAGNOSIS — F17210 Nicotine dependence, cigarettes, uncomplicated: Secondary | ICD-10-CM

## 2022-06-28 DIAGNOSIS — N611 Abscess of the breast and nipple: Secondary | ICD-10-CM | POA: Diagnosis not present

## 2022-06-28 DIAGNOSIS — Z794 Long term (current) use of insulin: Secondary | ICD-10-CM

## 2022-06-28 DIAGNOSIS — E1165 Type 2 diabetes mellitus with hyperglycemia: Secondary | ICD-10-CM

## 2022-06-28 DIAGNOSIS — L732 Hidradenitis suppurativa: Secondary | ICD-10-CM

## 2022-06-28 DIAGNOSIS — K047 Periapical abscess without sinus: Secondary | ICD-10-CM

## 2022-06-28 DIAGNOSIS — F1010 Alcohol abuse, uncomplicated: Secondary | ICD-10-CM | POA: Diagnosis not present

## 2022-06-28 DIAGNOSIS — L03313 Cellulitis of chest wall: Secondary | ICD-10-CM | POA: Diagnosis not present

## 2022-06-28 HISTORY — DX: Abscess of the breast and nipple: N61.1

## 2022-06-28 LAB — CBC
HCT: 31.4 % — ABNORMAL LOW (ref 39.0–52.0)
Hemoglobin: 11.4 g/dL — ABNORMAL LOW (ref 13.0–17.0)
MCH: 27.5 pg (ref 26.0–34.0)
MCHC: 36.3 g/dL — ABNORMAL HIGH (ref 30.0–36.0)
MCV: 75.8 fL — ABNORMAL LOW (ref 80.0–100.0)
Platelets: 435 10*3/uL — ABNORMAL HIGH (ref 150–400)
RBC: 4.14 MIL/uL — ABNORMAL LOW (ref 4.22–5.81)
RDW: 13.2 % (ref 11.5–15.5)
WBC: 22.7 10*3/uL — ABNORMAL HIGH (ref 4.0–10.5)
nRBC: 0 % (ref 0.0–0.2)

## 2022-06-28 LAB — BASIC METABOLIC PANEL
Anion gap: 10 (ref 5–15)
BUN: 10 mg/dL (ref 6–20)
CO2: 20 mmol/L — ABNORMAL LOW (ref 22–32)
Calcium: 8.3 mg/dL — ABNORMAL LOW (ref 8.9–10.3)
Chloride: 103 mmol/L (ref 98–111)
Creatinine, Ser: 0.8 mg/dL (ref 0.61–1.24)
GFR, Estimated: >60 mL/min (ref >60)
Glucose, Bld: 232 mg/dL — ABNORMAL HIGH (ref 70–99)
Potassium: 3.6 mmol/L (ref 3.5–5.1)
Sodium: 133 mmol/L — ABNORMAL LOW (ref 135–145)

## 2022-06-28 LAB — GLUCOSE, CAPILLARY
Glucose-Capillary: 220 mg/dL — ABNORMAL HIGH (ref 70–99)
Glucose-Capillary: 166 mg/dL — ABNORMAL HIGH (ref 70–99)
Glucose-Capillary: 178 mg/dL — ABNORMAL HIGH (ref 70–99)
Glucose-Capillary: 149 mg/dL — ABNORMAL HIGH (ref 70–99)
Glucose-Capillary: 148 mg/dL — ABNORMAL HIGH (ref 70–99)

## 2022-06-28 LAB — CULTURE, BLOOD (ROUTINE X 2)

## 2022-06-28 LAB — VANCOMYCIN, PEAK: Vancomycin Pk: 27 ug/mL — ABNORMAL LOW (ref 30–40)

## 2022-06-28 MED ORDER — HYDROMORPHONE HCL 1 MG/ML IJ SOLN
0.5000 mg | INTRAMUSCULAR | Status: DC | PRN
  Administered 2022-06-29 – 2022-07-01 (×3): 0.5 mg via INTRAVENOUS
  Filled 2022-06-28 (×3): qty 0.5

## 2022-06-28 MED ORDER — LIDOCAINE HCL (PF) 1 % IJ SOLN: 2.0000 mL | Freq: Once | INTRAMUSCULAR | Status: DC

## 2022-06-28 MED ORDER — LIDOCAINE HCL (PF) 1 % IJ SOLN
INTRAMUSCULAR | Status: AC
  Filled 2022-06-28: qty 30

## 2022-06-28 MED ORDER — FENTANYL CITRATE (PF) 100 MCG/2ML IJ SOLN
INTRAMUSCULAR | Status: AC | PRN
  Administered 2022-06-28 (×2): 25 ug via INTRAVENOUS

## 2022-06-28 MED ORDER — FENTANYL CITRATE (PF) 100 MCG/2ML IJ SOLN
INTRAMUSCULAR | Status: AC
  Filled 2022-06-28: qty 2

## 2022-06-28 MED ORDER — SODIUM CHLORIDE 0.9 % IV SOLN
3.0000 g | Freq: Four times a day (QID) | INTRAVENOUS | Status: DC
  Administered 2022-06-28 – 2022-06-29 (×4): 3 g via INTRAVENOUS
  Filled 2022-06-28 (×5): qty 8

## 2022-06-28 MED ORDER — MIDAZOLAM HCL 2 MG/2ML IJ SOLN
INTRAMUSCULAR | Status: AC
  Filled 2022-06-28: qty 2

## 2022-06-28 MED ORDER — MIDAZOLAM HCL 2 MG/2ML IJ SOLN
INTRAMUSCULAR | Status: AC | PRN
  Administered 2022-06-28: 1 mg via INTRAVENOUS

## 2022-06-28 NOTE — Consult Note (Signed)
Date of Admission:  06/23/2022          Reason for Consult:  Breast abscess    Referring Provider: Eulogio Bear, MD   Assessment:  Left breast abscess that is not responding to antibiotics and needs drainage Likely undiagnosed hidradenitis suppurativa Diagnosed diabetes mellitus with hemoglobin A1c 11.6 Morbid obesity Dental infection Apparent history of alcohol tobacco abuse  Plan:  Narrow to vancomycin and Unasyn Agree with IR guided drainage of abscess with material to be sent for aerobic and anaerobic cultures If he does not improve with drainage and antibiotics surgical consultation will be needed.   Principal Problem:   Cellulitis of chest wall Active Problems:   Hyperglycemia   Hyponatremia   Tobacco abuse   Alcohol abuse   Scheduled Meds:  enoxaparin (LOVENOX) injection  55 mg Subcutaneous Q24H   feeding supplement  237 mL Oral BID WC   fentaNYL       folic acid  1 mg Oral Daily   insulin aspart  0-15 Units Subcutaneous TID WC   insulin aspart  0-5 Units Subcutaneous QHS   insulin aspart protamine- aspart  16 Units Subcutaneous BID WC   midazolam       multivitamin with minerals  1 tablet Oral Daily   nicotine  14 mg Transdermal Daily   polyethylene glycol  17 g Oral Daily   thiamine  100 mg Oral Daily   Or   thiamine  100 mg Intravenous Daily   Continuous Infusions:  ampicillin-sulbactam (UNASYN) IV     vancomycin 1,250 mg (06/28/22 0609)   PRN Meds:.acetaminophen **OR** acetaminophen, fentaNYL, fentaNYL, ketorolac, metoCLOPramide (REGLAN) injection, metoprolol tartrate, midazolam, midazolam, ondansetron **OR** ondansetron (ZOFRAN) IV, mouth rinse, oxyCODONE  HPI: [name] is a 36 y.o. male with morbid obesity reported alcohol and tobacco abuse, who has had recurrent infections as that occur typically in his axilla but also his groin over many years.  He has actually never had any of these treated medically with antibiotics or with  incision and drainage.  He typically manages them by applying warm compresses and using a topical agent called "boil ease."  He had been on antibiotics roughly a month ago for dental abscess and apparently is scheduled to have tooth extraction.  Otherwise had not been on antibiotics recently.  He then developed in late November acute onset of pain and swelling in his left chest with fevers chills nausea and vomiting.  He missed 2 days of work from this and was being seen for excruciating pain that he rated a 10 out of severity.  ER CT scan was performed which showed normal thickening but no abscess.  Case was discussed with general surgery.  Blood cultures were taken and he was placed on broad-spectrum antibiotics in the form of vancomycin cefepime and metronidazole.  Spite being on quite broad-spectrum antibiotics he continued to have fevers and not had much in the way of pain relief.  He has began to have some spontaneous drainage of material from his left breast.  Ultrasound performed the third shows a 6.1 by 2.6 x 2.1 cm fluid collection undoubtedly due to an abscess.  He is scheduled for IR guided drainage of this today.  I do not think he needs on as broad spectrum of antibiotics as he is currently is.  We will narrow to vancomycin and Unasyn for now while awaiting culture data.  I do think he likely has undiagnosed hidradenitis suppurativa and  he would benefit ultimately by being seen by dermatology.  He also has newly diagnosed diabetes mellitus with a hemoglobin A1c 11.6.  If he does not respond to IR guided drainage and continued antibiotics he clearly needs a surgical consultation to assist with management of this area, in case biopsies need to be performed to consider other noninfectious inflammatory conditions that could cause a cutaneous processes of these.  I personally think this is a infected area that needs drainage and/or debridement.  I spent 84 minutes with the  patient including than 50% of the time in face to face counseling of the patient has left breast abscess recurrent infections and areas involving his axilla groin and 1 point his eyelid likely due to hidradenitis, personally reviewing TEE of the chest on November 30 ultrasound of the chest performed December 3 along with review of medical records in preparation for the visit and during the visit and in coordination of nis care.   Review of Systems: Review of Systems  Constitutional:  Positive for fever. Negative for chills, malaise/fatigue and weight loss.  HENT:  Negative for congestion and sore throat.   Eyes:  Negative for blurred vision and photophobia.  Respiratory:  Negative for cough, shortness of breath and wheezing.   Cardiovascular:  Negative for chest pain, palpitations and leg swelling.  Gastrointestinal:  Positive for nausea. Negative for abdominal pain, blood in stool, constipation, diarrhea, heartburn, melena and vomiting.  Genitourinary:  Negative for dysuria, flank pain and hematuria.  Musculoskeletal:  Negative for back pain, falls, joint pain and myalgias.  Skin:  Negative for itching and rash.  Neurological:  Negative for dizziness, focal weakness, loss of consciousness, weakness and headaches.  Endo/Heme/Allergies:  Does not bruise/bleed easily.  Psychiatric/Behavioral:  Negative for depression and suicidal ideas. The patient does not have insomnia.     Past Medical History:  Diagnosis Date   Asthma     Social History   Tobacco Use   Smoking status: Every Day    Packs/day: 1.00    Types: Cigarettes   Smokeless tobacco: Never  Substance Use Topics   Alcohol use: Yes    Alcohol/week: 13.0 standard drinks of alcohol    Types: 9 Cans of beer, 4 Shots of liquor per week   Drug use: Yes    Types: Marijuana    History reviewed. No pertinent family history. Allergies  Allergen Reactions   Peach [Prunus Persica]     Pt reports swelling and lip tingling as  reaction    OBJECTIVE: Blood pressure (!) 144/75, pulse (!) 115, temperature 99.7 F (37.6 C), temperature source Oral, resp. rate 19, height 5\' 9"  (1.753 m), weight 116.8 kg, SpO2 100 %.  Physical Exam Constitutional:      Appearance: He is well-developed. He is obese.  HENT:     Head: Normocephalic and atraumatic.  Eyes:     General:        Right eye: No discharge.        Left eye: No discharge.     Extraocular Movements: Extraocular movements intact.     Conjunctiva/sclera: Conjunctivae normal.  Cardiovascular:     Rate and Rhythm: Normal rate and regular rhythm.  Pulmonary:     Effort: Pulmonary effort is normal. No respiratory distress.     Breath sounds: No wheezing.  Abdominal:     General: There is no distension.     Palpations: Abdomen is soft.  Musculoskeletal:        General: No tenderness.  Normal range of motion.     Cervical back: Normal range of motion and neck supple.  Skin:    General: Skin is warm and dry.     Coloration: Skin is not pale.     Findings: No erythema or rash.  Neurological:     General: No focal deficit present.     Mental Status: He is alert and oriented to person, place, and time.  Psychiatric:        Mood and Affect: Mood normal.        Behavior: Behavior normal.        Thought Content: Thought content normal.        Judgment: Judgment normal.   Left breast quite indurated and tender:           he has scarring bilaterally in his axilla consistent with prior infections and consistent with likely diagnosis of hidradenitis suppurativa  Lab Results Lab Results  Component Value Date   WBC 22.7 (H) 06/28/2022   HGB 11.4 (L) 06/28/2022   HCT 31.4 (L) 06/28/2022   MCV 75.8 (L) 06/28/2022   PLT 435 (H) 06/28/2022    Lab Results  Component Value Date   CREATININE 0.80 06/28/2022   BUN 10 06/28/2022   NA 133 (L) 06/28/2022   K 3.6 06/28/2022   CL 103 06/28/2022   CO2 20 (L) 06/28/2022   No results found for: "ALT", "AST",  "GGT", "ALKPHOS", "BILITOT"   Microbiology: Recent Results (from the past 240 hour(s))  MRSA Next Gen by PCR, Nasal     Status: None   Collection Time: 06/23/22 12:36 AM   Specimen: Nasal Mucosa; Nasal Swab  Result Value Ref Range Status   MRSA by PCR Next Gen NOT DETECTED NOT DETECTED Final    Comment: (NOTE) The GeneXpert MRSA Assay (FDA approved for NASAL specimens only), is one component of a comprehensive MRSA colonization surveillance program. It is not intended to diagnose MRSA infection nor to guide or monitor treatment for MRSA infections. Test performance is not FDA approved in patients less than 38 years old. Performed at [facility], [address] [place], [place] [address]   Blood culture (routine x 2)     Status: None   Collection Time: 06/23/22  1:40 PM   Specimen: BLOOD RIGHT ARM  Result Value Ref Range Status   Specimen Description BLOOD RIGHT ARM  Final   Special Requests   Final    BOTTLES DRAWN AEROBIC AND ANAEROBIC Blood Culture results may not be optimal due to an excessive volume of blood received in culture bottles   Culture   Final    NO GROWTH 5 DAYS Performed at [facility], [address], [place], [place] [address]    Report Status 06/28/2022 FINAL  Final  Blood culture (routine x 2)     Status: None   Collection Time: 06/23/22  4:10 PM   Specimen: BLOOD  Result Value Ref Range Status   Specimen Description BLOOD SITE NOT SPECIFIED  Final   Special Requests   Final    BOTTLES DRAWN AEROBIC AND ANAEROBIC Blood Culture results may not be optimal due to an excessive volume of blood received in culture bottles   Culture   Final    NO GROWTH 5 DAYS Performed at [facility], [address], [place], [place] [address]    Report Status 06/28/2022 FINAL  Final  SARS Coronavirus 2 by RT PCR (hospital order, performed in  [facility] hospital lab) *cepheid single result test* Anterior Nasal Swab     Status: None   Collection Time:  06/25/22  2:26 PM   Specimen: Anterior Nasal Swab  Result Value Ref Range Status   SARS Coronavirus 2 by RT PCR NEGATIVE NEGATIVE Final    Comment: (NOTE) SARS-CoV-2 target nucleic acids are NOT DETECTED.  The SARS-CoV-2 RNA is generally detectable in upper and lower respiratory specimens during the acute phase of infection. The lowest concentration of SARS-CoV-2 viral copies this assay can detect is 250 copies / mL. A negative result does not preclude SARS-CoV-2 infection and should not be used as the sole basis for treatment or other patient management decisions.  A negative result may occur with improper specimen collection / handling, submission of specimen other than nasopharyngeal swab, presence of viral mutation(s) within the areas targeted by this assay, and inadequate number of viral copies (<250 copies / mL). A negative result must be combined with clinical observations, patient history, and epidemiological information.  Fact Sheet for Patients:   https://www.patel.info/  Fact Sheet for Healthcare Providers: https://hall.com/  This test is not yet approved or  cleared by the [place] FDA and has been authorized for detection and/or diagnosis of SARS-CoV-2 by FDA under an Emergency Use Authorization (EUA).  This EUA will remain in effect (meaning this test can be used) for the duration of the COVID-19 declaration under Section 564(b)(1) of the Act, 21 U.S.C. section 360bbb-3(b)(1), unless the authorization is terminated or revoked sooner.  Performed at [facility], [place] [address], [place], [place] [address]     Alcide Evener, [place] [facility] [phone] pager  06/28/2022, 11:47 AM

## 2022-06-28 NOTE — Progress Notes (Signed)
Mobility Specialist Progress Note    06/28/22 0946  Mobility  Activity Ambulated with assistance to bathroom  Level of Assistance Minimal assist, patient does 75% or more  Assistive Device Other (Comment) (HHA)  Distance Ambulated (ft) 20 ft (10+10)  Activity Response Tolerated fair  Mobility Referral Yes  $Mobility charge 1 Mobility   Post-Mobility: 131 HR  Pt received requesting to void. Upon exit from BR, pt noticeably sweating. C/o being hot and his side leaking. Returned to sitting EOB. RN notified. Provided cold washcloth on neck. Left with RN and NT present.   [place] Mobility Specialist  Please Neurosurgeon or Rehab Office at [phone]

## 2022-06-28 NOTE — Progress Notes (Signed)
PROGRESS NOTE    [name]  ZSW:[id number] DOB: 12-15-1985 DOA: 06/23/2022 PCP: Patient, [name]    Brief Narrative:  [name] is a 36 y.o. male with no known past medical history who presented with complaints of pain and swelling involving the left chest area.  He also developed fever chills.  Nausea vomiting.  Missed 2 days of work.  Patient reports that he has history of boils especially in his armpit area but nothing has been as severe as this particular infection.  Never had infection in the chest area previously.   In the emergency department patient was found to have swelling and erythema of the left chest area.  CT scan did not show any abscess.  Patient with IV antibiotics.  Still spiking temperatures.  S/p U/S that shows a ? Abscess-- IR to drain.  Cellulitis now with blisters.  WOC and ID consult.     Assessment and Plan:  Left chest wall cellulitis, sepsis present on admission:  - no evidence for abscess noted on CT scan but CT scan does not appear to show the entire area.  - CT scan did raise concern for infection versus malignancy.   -Presentation is most consistent with infection.   -EDP discussed with Dr. Andrey Campanile with general surgery.  There is a note from him in the chart.   - U/S with possible abscess as he continued to spike temperature-- recommending U/S guided aspiration-- IR consult placed -MRSA PCR negative.   - blood cultures NGTD - broad-spectrum coverage for now with vancomycin cefepime and metronidazole and de-escalate depending on clinical improvement and results of tests--- will consult ID -WOC consulted as blisters appeared    Hyperglycemia/newly diagnosed diabetes mellitus type 2: No known history of for diabetes.   - 11.6 HbA1c.  Monitor CBGs.   -diabetes coordinator and will need education -started long acting and SSI   Hyponatremia:  -Likely due to hypovolemia and sepsis.   -IV fluids.   -Recheck labs in the morning.    Alcohol abuse:   -Patient was counseled.  CIWA protocol.   Tobacco abuse:  -Patient was counseled.  Nicotine patch  Nausea -PRN zofran with addition of reglan       Code Status: Full Code   Disposition Plan:  Level of care: Progressive Status is: Inpatient Remains inpatient appropriate because: needs IV abx    Consultants:  GS (non-formal)-- note in chart ID IR WOC   Subjective: Still with nausea-- better with meds  Objective: Vitals:   06/27/22 1957 06/28/22 0033 06/28/22 0417 06/28/22 0441  BP: 127/82 130/81 (!) 152/94   Pulse: (!) 106 (!) 116    Resp: 20 17 (!) 23 18  Temp: 99.6 F (37.6 C) 100.1 F (37.8 C) 99.7 F (37.6 C)   TempSrc: Oral Oral Oral   SpO2: 98% 93% 93%   Weight:    116.8 kg  Height:        Intake/Output Summary (Last 24 hours) at 06/28/2022 1039 Last data filed at 06/28/2022 0540 Gross per 24 hour  Intake 1066.31 ml  Output --  Net 1066.31 ml   Filed Weights   06/23/22 1330 06/28/22 0441  Weight: 111.1 kg 116.8 kg    Examination:   General: Appearance:    Obese male who appears uncomfortable     Lungs:     Clear to auscultation bilaterally, respirations unlabored  Heart:    Tachycardic. Normal rhythm. No murmurs, rubs, or gallops.   MS:  All extremities are intact.   Neurologic:   Awake, alert       Data Reviewed: I have personally reviewed following labs and imaging studies  CBC: Recent Labs  Lab 06/23/22 1348 06/24/22 0036 06/25/22 0230 06/26/22 0136 06/27/22 0020 06/28/22 0023  WBC 21.2* 22.2* 23.1* 17.8* 20.3* 22.7*  NEUTROABS 18.2*  --   --   --   --   --   HGB 15.8 13.0 13.4 11.6* 11.3* 11.4*  HCT 45.2 37.8* 37.2* 32.6* 30.7* 31.4*  MCV 79.2* 81.1 77.5* 77.1* 75.2* 75.8*  PLT 311 273 278 278 318 435*   Basic Metabolic Panel: Recent Labs  Lab 06/24/22 0036 06/25/22 0230 06/26/22 0136 06/27/22 0020 06/28/22 0023  NA 128* 133* 132* 131* 133*  K 3.8 3.9 3.5 3.4* 3.6  CL 99 99 104 103 103  CO2 21* 16* 18* 18*  20*  GLUCOSE 260* 203* 199* 202* 232*  BUN 7 5* 7 8 10   CREATININE 0.98 1.04 0.99 0.88 0.80  CALCIUM 8.1* 8.8* 7.8* 7.8* 8.3*  MG 1.9  --   --   --   --    GFR: Estimated Creatinine Clearance: 157.8 mL/min (by C-G formula based on SCr of 0.8 mg/dL). Liver Function Tests: No results for input(s): "AST", "ALT", "ALKPHOS", "BILITOT", "PROT", "ALBUMIN" in the last 168 hours. No results for input(s): "LIPASE", "AMYLASE" in the last 168 hours. No results for input(s): "AMMONIA" in the last 168 hours. Coagulation Profile: No results for input(s): "INR", "PROTIME" in the last 168 hours. Cardiac Enzymes: No results for input(s): "CKTOTAL", "CKMB", "CKMBINDEX", "TROPONINI" in the last 168 hours. BNP (last 3 results) No results for input(s): "PROBNP" in the last 8760 hours. HbA1C: No results for input(s): "HGBA1C" in the last 72 hours.  CBG: Recent Labs  Lab 06/27/22 0615 06/27/22 1129 06/27/22 1622 06/27/22 2121 06/28/22 0651  GLUCAP 176* 192* 210* 199* 220*   Lipid Profile: No results for input(s): "CHOL", "HDL", "LDLCALC", "TRIG", "CHOLHDL", "LDLDIRECT" in the last 72 hours. Thyroid Function Tests: No results for input(s): "TSH", "T4TOTAL", "FREET4", "T3FREE", "THYROIDAB" in the last 72 hours.  Anemia Panel: No results for input(s): "VITAMINB12", "FOLATE", "FERRITIN", "TIBC", "IRON", "RETICCTPCT" in the last 72 hours. Sepsis Labs: Recent Labs  Lab 06/23/22 1340 06/24/22 0036 06/25/22 0230  PROCALCITON  --  0.21 1.35  LATICACIDVEN 1.9  --   --     Recent Results (from the past 240 hour(s))  MRSA Next Gen by PCR, Nasal     Status: None   Collection Time: 06/23/22 12:36 AM   Specimen: Nasal Mucosa; Nasal Swab  Result Value Ref Range Status   MRSA by PCR Next Gen NOT DETECTED NOT DETECTED Final    Comment: (NOTE) The GeneXpert MRSA Assay (FDA approved for NASAL specimens only), is one component of a comprehensive MRSA colonization surveillance program. It is not intended  to diagnose MRSA infection nor to guide or monitor treatment for MRSA infections. Test performance is not FDA approved in patients less than 74 years old. Performed at [facility], [address] [place], [place]   Blood culture (routine x 2)     Status: None   Collection Time: 06/23/22  1:40 PM   Specimen: BLOOD RIGHT ARM  Result Value Ref Range Status   Specimen Description BLOOD RIGHT ARM  Final   Special Requests   Final    BOTTLES DRAWN AEROBIC AND ANAEROBIC Blood Culture results may not be optimal due to an  excessive volume of blood received in culture bottles   Culture   Final    NO GROWTH 5 DAYS Performed at [facility], [address] [place], [place] [address]    Report Status 06/28/2022 FINAL  Final  Blood culture (routine x 2)     Status: None   Collection Time: 06/23/22  4:10 PM   Specimen: BLOOD  Result Value Ref Range Status   Specimen Description BLOOD SITE NOT SPECIFIED  Final   Special Requests   Final    BOTTLES DRAWN AEROBIC AND ANAEROBIC Blood Culture results may not be optimal due to an excessive volume of blood received in culture bottles   Culture   Final    NO GROWTH 5 DAYS Performed at [facility], [address] [place], [place] [address]    Report Status 06/28/2022 FINAL  Final  SARS Coronavirus 2 by RT PCR (hospital order, performed in [facility] hospital lab) *cepheid single result test* Anterior Nasal Swab     Status: None   Collection Time: 06/25/22  2:26 PM   Specimen: Anterior Nasal Swab  Result Value Ref Range Status   SARS Coronavirus 2 by RT PCR NEGATIVE NEGATIVE Final    Comment: (NOTE) SARS-CoV-2 target nucleic acids are NOT DETECTED.  The SARS-CoV-2 RNA is generally detectable in upper and lower respiratory specimens during the acute phase of infection. The lowest concentration of SARS-CoV-2 viral copies this assay can detect is 250 copies / mL. A negative result does not preclude SARS-CoV-2  infection and should not be used as the sole basis for treatment or other patient management decisions.  A negative result may occur with improper specimen collection / handling, submission of specimen other than nasopharyngeal swab, presence of viral mutation(s) within the areas targeted by this assay, and inadequate number of viral copies (<250 copies / mL). A negative result must be combined with clinical observations, patient history, and epidemiological information.  Fact Sheet for Patients:   RoadLapTop.co.zahttps://www.fda.gov/media/158405/download  Fact Sheet for Healthcare Providers: http://kim-miller.com/https://www.fda.gov/media/158404/download  This test is not yet approved or  cleared by the [place] FDA and has been authorized for detection and/or diagnosis of SARS-CoV-2 by FDA under an Emergency Use Authorization (EUA).  This EUA will remain in effect (meaning this test can be used) for the duration of the COVID-19 declaration under Section 564(b)(1) of the Act, 21 U.S.C. section 360bbb-3(b)(1), unless the authorization is terminated or revoked sooner.  Performed at [facility], [address] [place], [place] [address]          Radiology Studies: US AXILLA LEFT  Result Date: 06/27/2022 CLINICAL DATA:  Swelling left axilla in 2 lower outer left breast/chest wall towards the inframammary fold. Possible infection. EXAM: ULTRASOUND OF THE LEFT BREAST COMPARISON:  None available. FINDINGS: Examination was performed by the ultrasound technologist without breast imaging radiologist present. No corresponding mammographic evaluation for correlation. Targeted ultrasound is performed, showing an irregular shaped complicated fluid collection over the 5-6 o'clock position of the left breast towards the inframammary fold measuring approximately 2.1 x 2.6 x 6.1 cm. Mobile fluid is visualized within this collection on the sonographic images. Correlating with patient's presenting history, this likely  represents an abscess. There is associated subcutaneous edema. Possible mild associated skin thickening. IMPRESSION: 6.1 cm complicated fluid collection over the 5-6 o'clock position of the left breast towards the inframammary fold likely representing an abscess. RECOMMENDATION: Recommend completion of patient's diagnostic workup with left breast mammogram  when feasible. Also recommend ultrasound-guided aspiration of this presumed abscess and completion of antibiotic course. If abscess confirmed on aspiration, then would recommend follow-up ultrasound 1-2 weeks. If purulent material unable to be aspirated, would recommend converting procedure to ultrasound-guided core needle biopsy. I have discussed the findings and recommendations with the patient. If applicable, a reminder letter will be sent to the patient regarding the next appointment. BI-RADS CATEGORY  3: Probably benign. These results will be called to the ordering clinician or representative by the Radiologist Assistant, and communication documented in the PACS or [employer]. Electronically Signed   By: Elberta Fortis M.D.   On: 06/27/2022 11:39        Scheduled Meds:  enoxaparin (LOVENOX) injection  55 mg Subcutaneous Q24H   feeding supplement  237 mL Oral BID WC   folic acid  1 mg Oral Daily   insulin aspart  0-15 Units Subcutaneous TID WC   insulin aspart  0-5 Units Subcutaneous QHS   insulin aspart protamine- aspart  16 Units Subcutaneous BID WC   multivitamin with minerals  1 tablet Oral Daily   nicotine  14 mg Transdermal Daily   polyethylene glycol  17 g Oral Daily   thiamine  100 mg Oral Daily   Or   thiamine  100 mg Intravenous Daily   Continuous Infusions:  ampicillin-sulbactam (UNASYN) IV     vancomycin 1,250 mg (06/28/22 0609)     LOS: 5 days    Time spent: 45 minutes spent on chart review, discussion with nursing staff, consultants, updating family and interview/physical exam; more than 50% of that time was spent  in counseling and/or coordination of care.    Joseph Art, DO [facility] Available via Epic secure chat 7am-7pm After these hours, please refer to coverage provider listed on [email or web address] 06/28/2022, 10:39 AM

## 2022-06-28 NOTE — Consult Note (Signed)
WOC Nurse Consult Note: Reason for Consult: "left chest wall with unstageable area" Please note this is an infectious wound, not a pressure injury. Would not be staged Wound type:infectious   WOC consulted on this patient yesterday, see wound care orders.   Tocara Mennen [facility], CNS, [employer] [phone]

## 2022-06-28 NOTE — Progress Notes (Signed)
Left lateral chest wall/flank cellulitis with open blisters noted.  Blister noted to be spreading, wound continues to leak large amount of serous fluid. Black area noted under schulffing skin.  Wound cleansed, xerofoam placed over open areas, gauze and abd pads applied.  Unable to use foam for size reasons.  Tape applied sparingly to skin as to protect area.  Extra large abd binder ordered in hopes of securing bandage without tape.  Waiting for binder to arrive.  Wound care consult placed.

## 2022-06-28 NOTE — Procedures (Signed)
Interventional Radiology Procedure:   Indications: Left breast abscess  Procedure: US guided aspiration  Findings: Poorly defined fluid in left breast.  Aspirated 4 ml of cloudy brown fluid.   Complications: No immediate complications noted.     EBL: Minimal  Plan: Send fluid for culture.    Mcdonald Reiling R. Lowella Dandy, MD  Pager: [phone]

## 2022-06-29 DIAGNOSIS — N611 Abscess of the breast and nipple: Secondary | ICD-10-CM | POA: Diagnosis not present

## 2022-06-29 DIAGNOSIS — L03313 Cellulitis of chest wall: Secondary | ICD-10-CM | POA: Diagnosis not present

## 2022-06-29 LAB — CBC
HCT: 28.3 % — ABNORMAL LOW (ref 39.0–52.0)
Hemoglobin: 10.4 g/dL — ABNORMAL LOW (ref 13.0–17.0)
MCH: 27.8 pg (ref 26.0–34.0)
MCHC: 36.7 g/dL — ABNORMAL HIGH (ref 30.0–36.0)
MCV: 75.7 fL — ABNORMAL LOW (ref 80.0–100.0)
Platelets: 437 10*3/uL — ABNORMAL HIGH (ref 150–400)
RBC: 3.74 MIL/uL — ABNORMAL LOW (ref 4.22–5.81)
RDW: 13.1 % (ref 11.5–15.5)
WBC: 23.3 10*3/uL — ABNORMAL HIGH (ref 4.0–10.5)
nRBC: 0 % (ref 0.0–0.2)

## 2022-06-29 LAB — BASIC METABOLIC PANEL
Anion gap: 8 (ref 5–15)
BUN: 10 mg/dL (ref 6–20)
CO2: 21 mmol/L — ABNORMAL LOW (ref 22–32)
Calcium: 7.8 mg/dL — ABNORMAL LOW (ref 8.9–10.3)
Chloride: 100 mmol/L (ref 98–111)
Creatinine, Ser: 0.88 mg/dL (ref 0.61–1.24)
GFR, Estimated: >60 mL/min (ref >60)
Glucose, Bld: 191 mg/dL — ABNORMAL HIGH (ref 70–99)
Potassium: 3.5 mmol/L (ref 3.5–5.1)
Sodium: 129 mmol/L — ABNORMAL LOW (ref 135–145)

## 2022-06-29 LAB — GLUCOSE, CAPILLARY
Glucose-Capillary: 173 mg/dL — ABNORMAL HIGH (ref 70–99)
Glucose-Capillary: 165 mg/dL — ABNORMAL HIGH (ref 70–99)
Glucose-Capillary: 166 mg/dL — ABNORMAL HIGH (ref 70–99)
Glucose-Capillary: 149 mg/dL — ABNORMAL HIGH (ref 70–99)

## 2022-06-29 LAB — VANCOMYCIN, TROUGH: Vancomycin Tr: 10 ug/mL — ABNORMAL LOW (ref 15–20)

## 2022-06-29 MED ORDER — SODIUM CHLORIDE 0.9 % IV SOLN: INTRAVENOUS | Status: DC

## 2022-06-29 MED ORDER — METRONIDAZOLE 500 MG PO TABS
500.0000 mg | ORAL_TABLET | Freq: Two times a day (BID) | ORAL | Status: DC
  Administered 2022-06-29 – 2022-07-03 (×9): 500 mg via ORAL
  Filled 2022-06-29 (×9): qty 1

## 2022-06-29 MED ORDER — FUROSEMIDE 10 MG/ML IJ SOLN
40.0000 mg | Freq: Once | INTRAMUSCULAR | Status: AC
  Administered 2022-06-29: 40 mg via INTRAVENOUS
  Filled 2022-06-29: qty 4

## 2022-06-29 MED ORDER — SODIUM CHLORIDE 0.9 % IV SOLN
2.0000 g | Freq: Three times a day (TID) | INTRAVENOUS | Status: DC
  Administered 2022-06-29 – 2022-07-04 (×15): 2 g via INTRAVENOUS
  Filled 2022-06-29 (×16): qty 12.5

## 2022-06-29 MED ORDER — IBUPROFEN 200 MG PO TABS
600.0000 mg | ORAL_TABLET | Freq: Four times a day (QID) | ORAL | Status: DC | PRN
  Administered 2022-06-29 – 2022-07-07 (×4): 600 mg via ORAL
  Filled 2022-06-29 (×4): qty 3

## 2022-06-29 NOTE — Progress Notes (Signed)
PROGRESS NOTE    [name]  VZD:[id number] DOB: [birth year] DOA: 06/23/2022 PCP: Patient, [name]    Brief Narrative:  [name] is a 36 y.o. male with no known past medical history who presented with complaints of pain and swelling involving the left chest area.  He also developed fever chills.  Nausea vomiting.  Missed 2 days of work.  Patient reports that he has history of boils especially in his armpit area but nothing has been as severe as this particular infection.  Never had infection in the chest area previously.   In the emergency department patient was found to have swelling and erythema of the left chest area.  CT scan did not show any abscess.  Patient with IV antibiotics.  Still spiking temperatures.  S/p U/S that shows a ? Abscess-- IR to drain.  Cellulitis now with blisters.  WOC and ID consult.   12/6: Remained febrile with maximum temperature of 102.6 over the past 24 hours.  ID concern of hidradenitis suppurativa, s/p incision and drainage by IR, preliminary abscess culture with abundant gram-negative rods.  CBC with worsening leukocytosis, Vancomycin was discontinued and he was placed back to cefepime and Flagyl by ID  Assessment and Plan:  Left chest wall cellulitis, sepsis present on admission:  S/p incision and drainage with IR, preliminary cultures with gram-negative rods. Continue to have fever spikes. ID is on board - no evidence for abscess noted on CT scan but CT scan does not appear to show the entire area.  - CT scan did raise concern for infection versus malignancy.   -Presentation is most consistent with infection.   -EDP discussed with Dr. Andrey Campanile with general surgery.  There is a note from him in the chart.   - U/S with possible abscess --MRSA PCR negative.   - blood cultures NGTD --WOC consulted as blisters appeared -Continue with cefepime and Flagyl. -We will discontinue vancomycin as preliminary cultures gram-negative rods     Hyperglycemia/newly diagnosed diabetes mellitus type 2: No known history of for diabetes.   - 11.6 HbA1c.  Monitor CBGs.   -diabetes coordinator and will need education -started long acting and SSI   Hyponatremia:  -Likely due to hypovolemia and sepsis.   -IV fluids.   -Recheck labs in the morning.    Alcohol abuse:  -Patient was counseled.  CIWA protocol.   Tobacco abuse:  -Patient was counseled.  Nicotine patch  Nausea -PRN zofran with addition of reglan      Code Status: Full Code   Disposition Plan:  Level of care: Progressive Status is: Inpatient Remains inpatient appropriate because: needs IV abx    Consultants:  GS (non-formal)-- note in chart ID IR WOC   Subjective: Patient continued to complain about significant pain at her left lateral chest wall.   Objective: Vitals:   06/28/22 2349 06/29/22 0458 06/29/22 0500 06/29/22 0801  BP: (!) 101/57 119/73  126/77  Pulse:  (!) 109 (!) 110   Resp: 18 14  20   Temp: 99.8 F (37.7 C) (!) 100.7 F (38.2 C)  98.7 F (37.1 C)  TempSrc: Oral Oral  Oral  SpO2: 96% (!) 89%  92%  Weight:      Height:       No intake or output data in the 24 hours ending 06/29/22 0811  Filed Weights   06/23/22 1330 06/28/22 0441  Weight: 111.1 kg 116.8 kg    Examination: General.  Obese gentleman, in no acute  distress. Skin.  Significant erythema and edema involving left lateral and lateral posterior chest wall with left big bandage on the posterior portion. Pulmonary.  Lungs clear bilaterally, normal respiratory effort. CV.  Regular rate and rhythm, no JVD, rub or murmur. Abdomen.  Soft, nontender, nondistended, BS positive. CNS.  Alert and oriented .  No focal neurologic deficit. Extremities.  No edema, no cyanosis, pulses intact and symmetrical. Psychiatry.  Judgment and insight appears normal.      Data Reviewed: I have personally reviewed following labs and imaging studies  CBC: Recent Labs  Lab 06/23/22 1348  06/24/22 0036 06/25/22 0230 06/26/22 0136 06/27/22 0020 06/28/22 0023 06/29/22 0453  WBC 21.2*   < > 23.1* 17.8* 20.3* 22.7* 23.3*  NEUTROABS 18.2*  --   --   --   --   --   --   HGB 15.8   < > 13.4 11.6* 11.3* 11.4* 10.4*  HCT 45.2   < > 37.2* 32.6* 30.7* 31.4* 28.3*  MCV 79.2*   < > 77.5* 77.1* 75.2* 75.8* 75.7*  PLT 311   < > 278 278 318 435* 437*   < > = values in this interval not displayed.    Basic Metabolic Panel: Recent Labs  Lab 06/24/22 0036 06/25/22 0230 06/26/22 0136 06/27/22 0020 06/28/22 0023 06/29/22 0453  NA 128* 133* 132* 131* 133* 129*  K 3.8 3.9 3.5 3.4* 3.6 3.5  CL 99 99 104 103 103 100  CO2 21* 16* 18* 18* 20* 21*  GLUCOSE 260* 203* 199* 202* 232* 191*  BUN 7 5* 7 8 10 10   CREATININE 0.98 1.04 0.99 0.88 0.80 0.88  CALCIUM 8.1* 8.8* 7.8* 7.8* 8.3* 7.8*  MG 1.9  --   --   --   --   --     GFR: Estimated Creatinine Clearance: 143.4 mL/min (by C-G formula based on SCr of 0.88 mg/dL). Liver Function Tests: No results for input(s): "AST", "ALT", "ALKPHOS", "BILITOT", "PROT", "ALBUMIN" in the last 168 hours. No results for input(s): "LIPASE", "AMYLASE" in the last 168 hours. No results for input(s): "AMMONIA" in the last 168 hours. Coagulation Profile: No results for input(s): "INR", "PROTIME" in the last 168 hours. Cardiac Enzymes: No results for input(s): "CKTOTAL", "CKMB", "CKMBINDEX", "TROPONINI" in the last 168 hours. BNP (last 3 results) No results for input(s): "PROBNP" in the last 8760 hours. HbA1C: No results for input(s): "HGBA1C" in the last 72 hours.  CBG: Recent Labs  Lab 06/28/22 1236 06/28/22 1654 06/28/22 1920 06/28/22 2115 06/29/22 0621  GLUCAP 166* 178* 149* 148* 173*    Lipid Profile: No results for input(s): "CHOL", "HDL", "LDLCALC", "TRIG", "CHOLHDL", "LDLDIRECT" in the last 72 hours. Thyroid Function Tests: No results for input(s): "TSH", "T4TOTAL", "FREET4", "T3FREE", "THYROIDAB" in the last 72 hours.  Anemia  Panel: No results for input(s): "VITAMINB12", "FOLATE", "FERRITIN", "TIBC", "IRON", "RETICCTPCT" in the last 72 hours. Sepsis Labs: Recent Labs  Lab 06/23/22 1340 06/24/22 0036 06/25/22 0230  PROCALCITON  --  0.21 1.35  LATICACIDVEN 1.9  --   --      Recent Results (from the past 240 hour(s))  MRSA Next Gen by PCR, Nasal     Status: None   Collection Time: 06/23/22 12:36 AM   Specimen: Nasal Mucosa; Nasal Swab  Result Value Ref Range Status   MRSA by PCR Next Gen NOT DETECTED NOT DETECTED Final    Comment: (NOTE) The GeneXpert MRSA Assay (FDA approved for NASAL specimens only), is one  component of a comprehensive MRSA colonization surveillance program. It is not intended to diagnose MRSA infection nor to guide or monitor treatment for MRSA infections. Test performance is not FDA approved in patients less than 62 years old. Performed at [facility], [address] [place], [place] [address]   Blood culture (routine x 2)     Status: None   Collection Time: 06/23/22  1:40 PM   Specimen: BLOOD RIGHT ARM  Result Value Ref Range Status   Specimen Description BLOOD RIGHT ARM  Final   Special Requests   Final    BOTTLES DRAWN AEROBIC AND ANAEROBIC Blood Culture results may not be optimal due to an excessive volume of blood received in culture bottles   Culture   Final    NO GROWTH 5 DAYS Performed at [facility], [address] [place], [place] [address]    Report Status 06/28/2022 FINAL  Final  Blood culture (routine x 2)     Status: None   Collection Time: 06/23/22  4:10 PM   Specimen: BLOOD  Result Value Ref Range Status   Specimen Description BLOOD SITE NOT SPECIFIED  Final   Special Requests   Final    BOTTLES DRAWN AEROBIC AND ANAEROBIC Blood Culture results may not be optimal due to an excessive volume of blood received in culture bottles   Culture   Final    NO GROWTH 5 DAYS Performed at [facility], [address], [place], [place] [address]     Report Status 06/28/2022 FINAL  Final  SARS Coronavirus 2 by RT PCR (hospital order, performed in [facility] hospital lab) *cepheid single result test* Anterior Nasal Swab     Status: None   Collection Time: 06/25/22  2:26 PM   Specimen: Anterior Nasal Swab  Result Value Ref Range Status   SARS Coronavirus 2 by RT PCR NEGATIVE NEGATIVE Final    Comment: (NOTE) SARS-CoV-2 target nucleic acids are NOT DETECTED.  The SARS-CoV-2 RNA is generally detectable in upper and lower respiratory specimens during the acute phase of infection. The lowest concentration of SARS-CoV-2 viral copies this assay can detect is 250 copies / mL. A negative result does not preclude SARS-CoV-2 infection and should not be used as the sole basis for treatment or other patient management decisions.  A negative result may occur with improper specimen collection / handling, submission of specimen other than nasopharyngeal swab, presence of viral mutation(s) within the areas targeted by this assay, and inadequate number of viral copies (<250 copies / mL). A negative result must be combined with clinical observations, patient history, and epidemiological information.  Fact Sheet for Patients:   RoadLapTop.co.za  Fact Sheet for Healthcare Providers: http://kim-miller.com/  This test is not yet approved or  cleared by the [place] FDA and has been authorized for detection and/or diagnosis of SARS-CoV-2 by FDA under an Emergency Use Authorization (EUA).  This EUA will remain in effect (meaning this test can be used) for the duration of the COVID-19 declaration under Section 564(b)(1) of the Act, 21 U.S.C. section 360bbb-3(b)(1), unless the authorization is terminated or revoked sooner.  Performed at [facility], [address] [place], [place] [address]   Aerobic/Anaerobic Culture w Gram Stain (surgical/deep wound)     Status: None (Preliminary result)    Collection Time: 06/28/22 11:53 AM   Specimen: Abscess  Result Value Ref Range Status   Specimen Description ABSCESS  Final   Special Requests  LEFT BREAST  Final   Gram Stain   Final    ABUNDANT WBC PRESENT,BOTH PMN AND MONONUCLEAR ABUNDANT GRAM NEGATIVE RODS Performed at [facility], [address] [place], [place] [address]    Culture PENDING  Incomplete   Report Status PENDING  Incomplete     Radiology Studies: US FINE NEEDLE ASP 1ST LESION  Result Date: 06/28/2022 INDICATION: 36 year old male with left breast abscess and possible hidradenitis suppurative. EXAM: ULTRASOUND-GUIDED ASPIRATION OF LEFT BREAST ABSCESS MEDICATIONS: Moderate sedation ANESTHESIA/SEDATION: Moderate (conscious) sedation was employed during this procedure. A total of Versed 1.0mg  and fentanyl 50 mcg was administered intravenously at the order of the provider performing the procedure. Total intra-service moderate sedation time: 19 minutes. Patient's level of consciousness and vital signs were monitored continuously by radiology nurse throughout the procedure under the supervision of the provider performing the procedure. COMPLICATIONS: None immediate. PROCEDURE: Informed written consent was obtained from the patient after a thorough discussion of the procedural risks, benefits and alternatives. All questions were addressed. A timeout was performed prior to the initiation of the procedure. Left breast tissue was evaluated with ultrasound. Multiple areas throughout the left breast are erythematous with induration. Complex fluid collection near the 5 o'clock position was targeted for aspiration. This area was prepped with Betadine and sterile field was created. Skin was anesthetized using 1% lidocaine. Using ultrasound guidance, a Yueh catheter was directed into the complex collections. The Yueh catheter was repositioned multiple times. Only 4 mL cloudy brown fluid could be aspirated. Bandage placed over the puncture  site. FINDINGS: Soft tissues throughout the left breast are diffusely edematous. Complex fluid collection in the 5 o'clock position was targeted. Despite having a Yueh catheter well positioned in the complex fluid collection, only 4 mL of cloudy brown fluid could be removed. Collection could not be decompressed. IMPRESSION: Ultrasound guided aspiration of complex fluid in the left breast. Fluid was sent for culture. Electronically Signed   By: Richarda OverlieAdam  Henn M.D.   On: 06/28/2022 14:03        Scheduled Meds:  enoxaparin (LOVENOX) injection  55 mg Subcutaneous Q24H   feeding supplement  237 mL Oral BID WC   folic acid  1 mg Oral Daily   insulin aspart  0-15 Units Subcutaneous TID WC   insulin aspart  0-5 Units Subcutaneous QHS   insulin aspart protamine- aspart  16 Units Subcutaneous BID WC   lidocaine (PF)  2 mL Intradermal Once   multivitamin with minerals  1 tablet Oral Daily   nicotine  14 mg Transdermal Daily   polyethylene glycol  17 g Oral Daily   thiamine  100 mg Oral Daily   Or   thiamine  100 mg Intravenous Daily   Continuous Infusions:  ampicillin-sulbactam (UNASYN) IV 3 g (06/29/22 0513)   vancomycin 1,250 mg (06/29/22 0604)     LOS: 6 days    Time spent: 46 minutes spent on chart review, discussion with nursing staff, consultants, updating family and interview/physical exam; more than 50% of that time was spent in counseling and/or coordination of care.  This record has been created using Conservation officer, historic buildingsDragon voice recognition software. Errors have been sought and corrected,but may not always be located. Such creation errors do not reflect on the standard of care.   Arnetha CourserSumayya Cong Hightower, MD [facility] Available via Epic secure chat 7am-7pm After these hours, please refer to coverage provider listed on [email or web address] 06/29/2022, 8:11 AM

## 2022-06-29 NOTE — Progress Notes (Signed)
Mobility Specialist Progress Note    06/29/22 1420  Mobility  Activity Ambulated with assistance in room  Level of Assistance Contact guard assist, steadying assist  Assistive Device Other (Comment) (HHA)  Distance Ambulated (ft) 4 ft  Activity Response Tolerated fair  Mobility Referral Yes  $Mobility charge 1 Mobility   Post-Mobility: 128 HR, 96% SpO2  Pt received in chair stating he just threw up. Deferred further mobility and RN notified. Assisted pt to bed. Left with RN present.   [place] Mobility Specialist  Please Neurosurgeon or Rehab Office at [phone]

## 2022-06-29 NOTE — Progress Notes (Signed)
Subjective:  Says he feels significantly better after aspiration was performed yesterday.     Antibiotics:  Anti-infectives (From admission, onward)    Start     Dose/Rate Route Frequency Ordered Stop   06/29/22 1200  ceFEPIme (MAXIPIME) 2 g in sodium chloride 0.9 % 100 mL IVPB        2 g 200 mL/hr over 30 Minutes Intravenous Every 8 hours 06/29/22 1005     06/29/22 1100  metroNIDAZOLE (FLAGYL) tablet 500 mg        500 mg Oral Every 12 hours 06/29/22 1005     06/28/22 1200  Ampicillin-Sulbactam (UNASYN) 3 g in sodium chloride 0.9 % 100 mL IVPB  Status:  Discontinued        3 g 200 mL/hr over 30 Minutes Intravenous Every 6 hours 06/28/22 0911 06/29/22 1005   06/24/22 0600  ceFEPIme (MAXIPIME) 2 g in sodium chloride 0.9 % 100 mL IVPB  Status:  Discontinued        2 g 200 mL/hr over 30 Minutes Intravenous Every 8 hours 06/23/22 2041 06/28/22 0911   06/24/22 0300  vancomycin (VANCOREADY) IVPB 1250 mg/250 mL  Status:  Discontinued        1,250 mg 166.7 mL/hr over 90 Minutes Intravenous Every 12 hours 06/23/22 1543 06/29/22 0819   06/23/22 2100  ceFEPIme (MAXIPIME) 2 g in sodium chloride 0.9 % 100 mL IVPB        2 g 200 mL/hr over 30 Minutes Intravenous  Once 06/23/22 2006 06/23/22 2153   06/23/22 2100  metroNIDAZOLE (FLAGYL) IVPB 500 mg  Status:  Discontinued        500 mg 100 mL/hr over 60 Minutes Intravenous Every 12 hours 06/23/22 2006 06/28/22 0849   06/23/22 1600  vancomycin (VANCOREADY) IVPB 1500 mg/300 mL        1,500 mg 150 mL/hr over 120 Minutes Intravenous  Once 06/23/22 1514 06/23/22 1912   06/23/22 1430  vancomycin (VANCOCIN) IVPB 1000 mg/200 mL premix        1,000 mg 200 mL/hr over 60 Minutes Intravenous  Once 06/23/22 1418 06/23/22 1624   06/23/22 1430  cefTRIAXone (ROCEPHIN) 2 g in sodium chloride 0.9 % 100 mL IVPB        2 g 200 mL/hr over 30 Minutes Intravenous  Once 06/23/22 1418 06/23/22 1506       Medications: Scheduled Meds:  enoxaparin  (LOVENOX) injection  55 mg Subcutaneous Q24H   feeding supplement  237 mL Oral BID WC   folic acid  1 mg Oral Daily   insulin aspart  0-15 Units Subcutaneous TID WC   insulin aspart  0-5 Units Subcutaneous QHS   insulin aspart protamine- aspart  16 Units Subcutaneous BID WC   lidocaine (PF)  2 mL Intradermal Once   metroNIDAZOLE  500 mg Oral Q12H   multivitamin with minerals  1 tablet Oral Daily   nicotine  14 mg Transdermal Daily   polyethylene glycol  17 g Oral Daily   thiamine  100 mg Oral Daily   Continuous Infusions:  ceFEPime (MAXIPIME) IV     PRN Meds:.acetaminophen **OR** acetaminophen, HYDROmorphone (DILAUDID) injection, ketorolac, metoCLOPramide (REGLAN) injection, metoprolol tartrate, ondansetron **OR** ondansetron (ZOFRAN) IV, mouth rinse, oxyCODONE    Objective: Weight change:  No intake or output data in the 24 hours ending 06/29/22 1145 Blood pressure 126/77, pulse (!) 110, temperature 98.7 F (37.1 C), temperature source Oral, resp. rate 20, height 5\' 9"  (  1.753 m), weight 116.8 kg, SpO2 92 %. Temp:  [98.7 F (37.1 C)-102.6 F (39.2 C)] 98.7 F (37.1 C) (12/06 0801) Pulse Rate:  [109-123] 110 (12/06 0500) Resp:  [14-25] 20 (12/06 0801) BP: (101-157)/(57-89) 126/77 (12/06 0801) SpO2:  [89 %-98 %] 92 % (12/06 0801)  Physical Exam: Physical Exam Constitutional:      Appearance: He is well-developed. He is obese.  HENT:     Head: Normocephalic and atraumatic.  Eyes:     Conjunctiva/sclera: Conjunctivae normal.  Cardiovascular:     Rate and Rhythm: Normal rate and regular rhythm.  Pulmonary:     Effort: Pulmonary effort is normal. No respiratory distress.     Breath sounds: No wheezing.  Abdominal:     General: There is no distension.     Palpations: Abdomen is soft.  Musculoskeletal:        General: Normal range of motion.     Cervical back: Normal range of motion and neck supple.  Skin:    General: Skin is warm and dry.     Findings: Erythema  present. No rash.  Neurological:     General: No focal deficit present.     Mental Status: He is alert and oriented to person, place, and time.  Psychiatric:        Mood and Affect: Mood normal.        Behavior: Behavior normal.        Thought Content: Thought content normal.        Judgment: Judgment normal.   Left chest erythema and induration may be a little bit reduced versus yesterday.  CBC:    BMET Recent Labs    06/28/22 0023 06/29/22 0453  NA 133* 129*  K 3.6 3.5  CL 103 100  CO2 20* 21*  GLUCOSE 232* 191*  BUN 10 10  CREATININE 0.80 0.88  CALCIUM 8.3* 7.8*     Liver Panel  No results for input(s): "PROT", "ALBUMIN", "AST", "ALT", "ALKPHOS", "BILITOT", "BILIDIR", "IBILI" in the last 72 hours.     Sedimentation Rate No results for input(s): "ESRSEDRATE" in the last 72 hours. C-Reactive Protein No results for input(s): "CRP" in the last 72 hours.  Micro Results: Recent Results (from the past 720 hour(s))  MRSA Next Gen by PCR, Nasal     Status: None   Collection Time: 06/23/22 12:36 AM   Specimen: Nasal Mucosa; Nasal Swab  Result Value Ref Range Status   MRSA by PCR Next Gen NOT DETECTED NOT DETECTED Final    Comment: (NOTE) The GeneXpert MRSA Assay (FDA approved for NASAL specimens only), is one component of a comprehensive MRSA colonization surveillance program. It is not intended to diagnose MRSA infection nor to guide or monitor treatment for MRSA infections. Test performance is not FDA approved in patients less than 38 years old. Performed at [facility], [address] [place], [place] [address]   Blood culture (routine x 2)     Status: None   Collection Time: 06/23/22  1:40 PM   Specimen: BLOOD RIGHT ARM  Result Value Ref Range Status   Specimen Description BLOOD RIGHT ARM  Final   Special Requests   Final    BOTTLES DRAWN AEROBIC AND ANAEROBIC Blood Culture results may not be optimal due to an excessive volume of blood received  in culture bottles   Culture   Final    NO GROWTH 5 DAYS Performed at [facility], [address] [place],  [place] [address]    Report Status 06/28/2022 FINAL  Final  Blood culture (routine x 2)     Status: None   Collection Time: 06/23/22  4:10 PM   Specimen: BLOOD  Result Value Ref Range Status   Specimen Description BLOOD SITE NOT SPECIFIED  Final   Special Requests   Final    BOTTLES DRAWN AEROBIC AND ANAEROBIC Blood Culture results may not be optimal due to an excessive volume of blood received in culture bottles   Culture   Final    NO GROWTH 5 DAYS Performed at [facility], [address] [place], [place] [address]    Report Status 06/28/2022 FINAL  Final  SARS Coronavirus 2 by RT PCR (hospital order, performed in [facility] hospital lab) *cepheid single result test* Anterior Nasal Swab     Status: None   Collection Time: 06/25/22  2:26 PM   Specimen: Anterior Nasal Swab  Result Value Ref Range Status   SARS Coronavirus 2 by RT PCR NEGATIVE NEGATIVE Final    Comment: (NOTE) SARS-CoV-2 target nucleic acids are NOT DETECTED.  The SARS-CoV-2 RNA is generally detectable in upper and lower respiratory specimens during the acute phase of infection. The lowest concentration of SARS-CoV-2 viral copies this assay can detect is 250 copies / mL. A negative result does not preclude SARS-CoV-2 infection and should not be used as the sole basis for treatment or other patient management decisions.  A negative result may occur with improper specimen collection / handling, submission of specimen other than nasopharyngeal swab, presence of viral mutation(s) within the areas targeted by this assay, and inadequate number of viral copies (<250 copies / mL). A negative result must be combined with clinical observations, patient history, and epidemiological information.  Fact Sheet for Patients:   RoadLapTop.co.za  Fact Sheet for Healthcare  Providers: http://kim-miller.com/  This test is not yet approved or  cleared by the [place] FDA and has been authorized for detection and/or diagnosis of SARS-CoV-2 by FDA under an Emergency Use Authorization (EUA).  This EUA will remain in effect (meaning this test can be used) for the duration of the COVID-19 declaration under Section 564(b)(1) of the Act, 21 U.S.C. section 360bbb-3(b)(1), unless the authorization is terminated or revoked sooner.  Performed at [facility], [address] [place], [place] [address]   Aerobic/Anaerobic Culture w Gram Stain (surgical/deep wound)     Status: None (Preliminary result)   Collection Time: 06/28/22 11:53 AM   Specimen: Abscess  Result Value Ref Range Status   Specimen Description ABSCESS  Final   Special Requests LEFT BREAST  Final   Gram Stain   Final    ABUNDANT WBC PRESENT,BOTH PMN AND MONONUCLEAR ABUNDANT GRAM NEGATIVE RODS    Culture   Final    NO GROWTH < 24 HOURS Performed at [facility], [address] [place], [place] [address]    Report Status PENDING  Incomplete    Studies/Results: [place] FINE NEEDLE ASP 1ST LESION  Result Date: 06/28/2022 INDICATION: 36 year old male with left breast abscess and possible hidradenitis suppurative. EXAM: ULTRASOUND-GUIDED ASPIRATION OF LEFT BREAST ABSCESS MEDICATIONS: Moderate sedation ANESTHESIA/SEDATION: Moderate (conscious) sedation was employed during this procedure. A total of Versed 1.0mg  and fentanyl 50 mcg was administered intravenously at the order of the provider performing the procedure. Total intra-service moderate sedation time: 19 minutes. Patient's level of consciousness and vital signs were monitored continuously by radiology nurse throughout the procedure under the supervision  of the provider performing the procedure. COMPLICATIONS: None immediate. PROCEDURE: Informed written consent was obtained from the patient after a thorough discussion  of the procedural risks, benefits and alternatives. All questions were addressed. A timeout was performed prior to the initiation of the procedure. Left breast tissue was evaluated with ultrasound. Multiple areas throughout the left breast are erythematous with induration. Complex fluid collection near the 5 o'clock position was targeted for aspiration. This area was prepped with Betadine and sterile field was created. Skin was anesthetized using 1% lidocaine. Using ultrasound guidance, a Yueh catheter was directed into the complex collections. The Yueh catheter was repositioned multiple times. Only 4 mL cloudy brown fluid could be aspirated. Bandage placed over the puncture site. FINDINGS: Soft tissues throughout the left breast are diffusely edematous. Complex fluid collection in the 5 o'clock position was targeted. Despite having a Yueh catheter well positioned in the complex fluid collection, only 4 mL of cloudy brown fluid could be removed. Collection could not be decompressed. IMPRESSION: Ultrasound guided aspiration of complex fluid in the left breast. Fluid was sent for culture. Electronically Signed   By: Richarda Overlie M.D.   On: 06/28/2022 14:03      Assessment/Plan:  INTERVAL HISTORY: Status post IR guided removal of fluid from his abscess   Principal Problem:   Cellulitis of chest wall Active Problems:   Hyperglycemia   Hyponatremia   Tobacco abuse   Alcohol abuse   Breast abscess   Hidradenitis suppurativa   Dental infection   Morbid obesity (HCC)   Type 2 diabetes mellitus with hyperglycemia (HCC)    [name] is a 36 y.o. male with morbid obesity newly diagnosed noticed diabetes mellitus likely retinitis suppurativa with recurrent infections in his soft tissues in particular axilla groin now admitted with eft soft tissue breast infection  #1 breast abscess:  Hopefully drainage of the material by interventional radiology may be sufficient to help [place] gain source  control.  If this is not the case we will need help from general surgery.  The Gram stain showed abundant gram-negative rods but no organism is growing to this point.  We will discontinue his anti-MRSA coverage and stop vancomycin.  We will move from Unasyn back to cefepime and Flagyl   I spent 36 minutes with the patient including than 50% of the time in face to face counseling of the patient breast abscess,  along with review of medical records in preparation for the visit and during the visit and in coordination of his care.    LOS: 6 days   Acey Lav 06/29/2022, 11:45 AM

## 2022-06-30 DIAGNOSIS — L0291 Cutaneous abscess, unspecified: Secondary | ICD-10-CM | POA: Diagnosis not present

## 2022-06-30 DIAGNOSIS — L732 Hidradenitis suppurativa: Secondary | ICD-10-CM | POA: Diagnosis not present

## 2022-06-30 DIAGNOSIS — A419 Sepsis, unspecified organism: Secondary | ICD-10-CM | POA: Diagnosis not present

## 2022-06-30 DIAGNOSIS — R739 Hyperglycemia, unspecified: Secondary | ICD-10-CM | POA: Diagnosis not present

## 2022-06-30 DIAGNOSIS — L03313 Cellulitis of chest wall: Secondary | ICD-10-CM | POA: Diagnosis not present

## 2022-06-30 DIAGNOSIS — N611 Abscess of the breast and nipple: Secondary | ICD-10-CM | POA: Diagnosis not present

## 2022-06-30 LAB — GLUCOSE, CAPILLARY
Glucose-Capillary: 147 mg/dL — ABNORMAL HIGH (ref 70–99)
Glucose-Capillary: 134 mg/dL — ABNORMAL HIGH (ref 70–99)
Glucose-Capillary: 178 mg/dL — ABNORMAL HIGH (ref 70–99)
Glucose-Capillary: 114 mg/dL — ABNORMAL HIGH (ref 70–99)

## 2022-06-30 LAB — BASIC METABOLIC PANEL
Anion gap: 11 (ref 5–15)
BUN: 9 mg/dL (ref 6–20)
CO2: 22 mmol/L (ref 22–32)
Calcium: 8.2 mg/dL — ABNORMAL LOW (ref 8.9–10.3)
Chloride: 100 mmol/L (ref 98–111)
Creatinine, Ser: 0.76 mg/dL (ref 0.61–1.24)
GFR, Estimated: >60 mL/min (ref >60)
Glucose, Bld: 131 mg/dL — ABNORMAL HIGH (ref 70–99)
Potassium: 3.3 mmol/L — ABNORMAL LOW (ref 3.5–5.1)
Sodium: 133 mmol/L — ABNORMAL LOW (ref 135–145)

## 2022-06-30 MED ORDER — ALPRAZOLAM 0.5 MG PO TABS
0.5000 mg | ORAL_TABLET | Freq: Three times a day (TID) | ORAL | Status: DC | PRN
  Administered 2022-06-30 – 2022-07-01 (×3): 0.5 mg via ORAL
  Filled 2022-06-30 (×3): qty 1

## 2022-06-30 MED ORDER — IBUPROFEN 200 MG PO TABS: 600.0000 mg | ORAL_TABLET | Freq: Once | ORAL | Status: AC

## 2022-06-30 MED ORDER — POTASSIUM CHLORIDE CRYS ER 20 MEQ PO TBCR
40.0000 meq | EXTENDED_RELEASE_TABLET | Freq: Two times a day (BID) | ORAL | Status: AC
  Administered 2022-06-30: 40 meq via ORAL
  Filled 2022-06-30 (×2): qty 2

## 2022-06-30 MED ORDER — SODIUM CHLORIDE 0.9 % IV SOLN: INTRAVENOUS | Status: AC

## 2022-06-30 MED ORDER — SODIUM CHLORIDE 0.9 % IV SOLN: INTRAVENOUS | Status: DC

## 2022-06-30 NOTE — Consult Note (Signed)
Consult Note  [name] 15-Oct-1985  [id number].    Requesting MD: Marinda Elk, MS Chief Complaint/Reason for Consult: left breast abscess vs malignancy HPI:  Patient is a 36 year old male who was admitted 11/30 with pain and swelling in left chest and associated fever/chills. He has a hx of prior skin infections in armpits but never this severe. Patient reported sharp pain on presentation and denied any drainage at that time. No other known past medical hx. Initial CT showed inflammation but no abscess. US obtained 12/4 and showed a 6 cm complex fluid collection. IR consulted for aspiration 12/5 but this did not yield much fluid. Patient is a newly diagnosed type 2 diabetic and A1c found to be >11. Prior to admission he smoked cigarettes daily and drank alcohol daily, but states he is quitting these. Lives at home with his mother.   History reviewed. No pertinent family history.  Past Medical History:  Diagnosis Date   Asthma    Breast abscess 06/28/2022    History reviewed. No pertinent surgical history.  Social History:  reports that he has been smoking cigarettes. He has been smoking an average of 1 pack per day. He has never used smokeless tobacco. He reports current alcohol use of about 13.0 standard drinks of alcohol per week. He reports current drug use. Drug: Marijuana.  Allergies:  Allergies  Allergen Reactions   Peach [Prunus Persica]     Pt reports swelling and lip tingling as reaction    Medications Prior to Admission  Medication Sig Dispense Refill   ibuprofen (ADVIL) 200 MG tablet Take 200 mg by mouth every 6 (six) hours as needed for mild pain.     naproxen (NAPROSYN) 500 MG tablet Take 1 tablet (500 mg total) by mouth 2 (two) times daily. (Patient not taking: Reported on 06/23/2022) 30 tablet 0    Blood pressure 134/81, pulse 100, temperature (!) 102.8 F (39.3 C), temperature source Oral, resp. rate 20, height 5\' 9"  (1.753 m), weight 116.8  kg, SpO2 98 %. Physical Exam:  General: pleasant, WD, obese male who is sitting up in chair in no acute distress HEENT: head is normocephalic, atraumatic.  Sclera are noninjected.  Pupils equal and round.  Ears and nose without any masses or lesions.  Mouth is pink and moist Heart: RRR Lungs: CTAB, no wheezes, rhonchi, or rales noted.  Respiratory effort nonlabored on room air Abd: soft, NT, ND, +BS, no masses, hernias, or organomegaly MS: all 4 extremities are symmetrical with no cyanosis, clubbing, or edema. Psych: A&Ox3 with an appropriate affect. Skin: left chest wall/breast pictured below with significant cellulitis and excoriated skin        Results for orders placed or performed during the hospital encounter of 06/23/22 (from the past 48 hour(s))  Glucose, capillary     Status: Abnormal   Collection Time: 06/28/22  4:54 PM  Result Value Ref Range   Glucose-Capillary 178 (H) 70 - 99 mg/dL    Comment: Glucose reference range applies only to samples taken after fasting for at least 8 hours.  Glucose, capillary     Status: Abnormal   Collection Time: 06/28/22  7:20 PM  Result Value Ref Range   Glucose-Capillary 149 (H) 70 - 99 mg/dL    Comment: Glucose reference range applies only to samples taken after fasting for at least 8 hours.  Vancomycin, peak     Status: Abnormal   Collection Time: 06/28/22  8:39 PM  Result Value Ref Range   Vancomycin Pk 27 (L) 30 - 40 ug/mL    Comment: Performed at [facility], [address] [place], [place] [address]  Glucose, capillary     Status: Abnormal   Collection Time: 06/28/22  9:15 PM  Result Value Ref Range   Glucose-Capillary 148 (H) 70 - 99 mg/dL    Comment: Glucose reference range applies only to samples taken after fasting for at least 8 hours.   Comment 1 Notify RN    Comment 2 Document in Chart   CBC     Status: Abnormal   Collection Time: 06/29/22  4:53 AM  Result Value Ref Range   WBC 23.3 (H) 4.0 - 10.5 K/uL   RBC  3.74 (L) 4.22 - 5.81 MIL/uL   Hemoglobin 10.4 (L) 13.0 - 17.0 g/dL   HCT 28.3 (L) 39.0 - 52.0 %   MCV 75.7 (L) 80.0 - 100.0 fL   MCH 27.8 26.0 - 34.0 pg   MCHC 36.7 (H) 30.0 - 36.0 g/dL   RDW 13.1 11.5 - 15.5 %   Platelets 437 (H) 150 - 400 K/uL   nRBC 0.0 0.0 - 0.2 %    Comment: Performed at [facility], [address] [place], [place] [address]  Basic metabolic panel     Status: Abnormal   Collection Time: 06/29/22  4:53 AM  Result Value Ref Range   Sodium 129 (L) 135 - 145 mmol/L   Potassium 3.5 3.5 - 5.1 mmol/L   Chloride 100 98 - 111 mmol/L   CO2 21 (L) 22 - 32 mmol/L   Glucose, Bld 191 (H) 70 - 99 mg/dL    Comment: Glucose reference range applies only to samples taken after fasting for at least 8 hours.   BUN 10 6 - 20 mg/dL   Creatinine, Ser 0.88 0.61 - 1.24 mg/dL   Calcium 7.8 (L) 8.9 - 10.3 mg/dL   GFR, Estimated >60 >60 mL/min    Comment: (NOTE) Calculated using the CKD-EPI Creatinine Equation (2021)    Anion gap 8 5 - 15    Comment: Performed at [facility], [address] [place], [place] [address]  Vancomycin, trough     Status: Abnormal   Collection Time: 06/29/22  4:53 AM  Result Value Ref Range   Vancomycin Tr 10 (L) 15 - 20 ug/mL    Comment: Performed at [facility], [address] , [place] [address]  Glucose, capillary     Status: Abnormal   Collection Time: 06/29/22  6:21 AM  Result Value Ref Range   Glucose-Capillary 173 (H) 70 - 99 mg/dL    Comment: Glucose reference range applies only to samples taken after fasting for at least 8 hours.   Comment 1 Notify RN    Comment 2 Document in Chart   Glucose, capillary     Status: Abnormal   Collection Time: 06/29/22 11:45 AM  Result Value Ref Range   Glucose-Capillary 165 (H) 70 - 99 mg/dL    Comment: Glucose reference range applies only to samples taken after fasting for at least 8 hours.  Glucose, capillary     Status: Abnormal   Collection Time: 06/29/22  4:49 PM  Result  Value Ref Range   Glucose-Capillary 166 (H) 70 - 99 mg/dL    Comment: Glucose reference range applies only to samples taken after fasting for at least 8 hours.  Glucose, capillary       Status: Abnormal   Collection Time: 06/29/22  8:59 PM  Result Value Ref Range   Glucose-Capillary 149 (H) 70 - 99 mg/dL    Comment: Glucose reference range applies only to samples taken after fasting for at least 8 hours.  Glucose, capillary     Status: Abnormal   Collection Time: 06/30/22  5:45 AM  Result Value Ref Range   Glucose-Capillary 147 (H) 70 - 99 mg/dL    Comment: Glucose reference range applies only to samples taken after fasting for at least 8 hours.   Comment 1 Notify RN    Comment 2 Document in Chart   Basic metabolic panel     Status: Abnormal   Collection Time: 06/30/22  8:59 AM  Result Value Ref Range   Sodium 133 (L) 135 - 145 mmol/L   Potassium 3.3 (L) 3.5 - 5.1 mmol/L   Chloride 100 98 - 111 mmol/L   CO2 22 22 - 32 mmol/L   Glucose, Bld 131 (H) 70 - 99 mg/dL    Comment: Glucose reference range applies only to samples taken after fasting for at least 8 hours.   BUN 9 6 - 20 mg/dL   Creatinine, Ser 0.76 0.61 - 1.24 mg/dL   Calcium 8.2 (L) 8.9 - 10.3 mg/dL   GFR, Estimated >60 >60 mL/min    Comment: (NOTE) Calculated using the CKD-EPI Creatinine Equation (2021)    Anion gap 11 5 - 15    Comment: Performed at [facility], [address] [place], [place] [address]  Glucose, capillary     Status: Abnormal   Collection Time: 06/30/22 11:38 AM  Result Value Ref Range   Glucose-Capillary 134 (H) 70 - 99 mg/dL    Comment: Glucose reference range applies only to samples taken after fasting for at least 8 hours.   No results found.    Assessment/Plan L chest wall/breast abscess - CT 11/30 with dermal thickening of left breast and soft tissue edema, borderline enlarged left axillary lymph nodes - L axillary US 12/4 with 6 cm complicated fluid collection likely representing  abscess - s/p US guided aspiration 12/5 which yielded only 4 cc cloudy brown fluid and collection was not able to be decompressed - WBC 23K yesterday, febrile to 102.8 today  - recommend I&D in the OR - pt just ate so will plan for tomorrow AM  FEN: CM diet, NPO after MN VTE: LMWH ID: [id number]   - below per TRH -  T2DM, uncontrolled - A1c 11.6, SSI Alcohol abuse Tobacco abuse Obesity class II   I reviewed hospitalist notes, last 24 h vitals and pain scores, last 48 h intake and output, last 24 h labs and trends, and last 24 h imaging results.   Waver Dibiasio A Brandice Busser, PA-C [facility] 06/30/2022, 3:09 PM Please see Amion for pager number during day hours 7:00am-4:30pm   

## 2022-06-30 NOTE — H&P (View-Only) (Signed)
Consult Note  [name] 15-Oct-1985  [id number].    Requesting MD: Marinda Elk, MS Chief Complaint/Reason for Consult: left breast abscess vs malignancy HPI:  Patient is a 36 year old male who was admitted 11/30 with pain and swelling in left chest and associated fever/chills. He has a hx of prior skin infections in armpits but never this severe. Patient reported sharp pain on presentation and denied any drainage at that time. No other known past medical hx. Initial CT showed inflammation but no abscess. US obtained 12/4 and showed a 6 cm complex fluid collection. IR consulted for aspiration 12/5 but this did not yield much fluid. Patient is a newly diagnosed type 2 diabetic and A1c found to be >11. Prior to admission he smoked cigarettes daily and drank alcohol daily, but states he is quitting these. Lives at home with his mother.   History reviewed. No pertinent family history.  Past Medical History:  Diagnosis Date   Asthma    Breast abscess 06/28/2022    History reviewed. No pertinent surgical history.  Social History:  reports that he has been smoking cigarettes. He has been smoking an average of 1 pack per day. He has never used smokeless tobacco. He reports current alcohol use of about 13.0 standard drinks of alcohol per week. He reports current drug use. Drug: Marijuana.  Allergies:  Allergies  Allergen Reactions   Peach [Prunus Persica]     Pt reports swelling and lip tingling as reaction    Medications Prior to Admission  Medication Sig Dispense Refill   ibuprofen (ADVIL) 200 MG tablet Take 200 mg by mouth every 6 (six) hours as needed for mild pain.     naproxen (NAPROSYN) 500 MG tablet Take 1 tablet (500 mg total) by mouth 2 (two) times daily. (Patient not taking: Reported on 06/23/2022) 30 tablet 0    Blood pressure 134/81, pulse 100, temperature (!) 102.8 F (39.3 C), temperature source Oral, resp. rate 20, height 5\' 9"  (1.753 m), weight 116.8  kg, SpO2 98 %. Physical Exam:  General: pleasant, WD, obese male who is sitting up in chair in no acute distress HEENT: head is normocephalic, atraumatic.  Sclera are noninjected.  Pupils equal and round.  Ears and nose without any masses or lesions.  Mouth is pink and moist Heart: RRR Lungs: CTAB, no wheezes, rhonchi, or rales noted.  Respiratory effort nonlabored on room air Abd: soft, NT, ND, +BS, no masses, hernias, or organomegaly MS: all 4 extremities are symmetrical with no cyanosis, clubbing, or edema. Psych: A&Ox3 with an appropriate affect. Skin: left chest wall/breast pictured below with significant cellulitis and excoriated skin        Results for orders placed or performed during the hospital encounter of 06/23/22 (from the past 48 hour(s))  Glucose, capillary     Status: Abnormal   Collection Time: 06/28/22  4:54 PM  Result Value Ref Range   Glucose-Capillary 178 (H) 70 - 99 mg/dL    Comment: Glucose reference range applies only to samples taken after fasting for at least 8 hours.  Glucose, capillary     Status: Abnormal   Collection Time: 06/28/22  7:20 PM  Result Value Ref Range   Glucose-Capillary 149 (H) 70 - 99 mg/dL    Comment: Glucose reference range applies only to samples taken after fasting for at least 8 hours.  Vancomycin, peak     Status: Abnormal   Collection Time: 06/28/22  8:39 PM  Result Value Ref Range   Vancomycin Pk 27 (L) 30 - 40 ug/mL    Comment: Performed at [facility], [address], [place], [place] [address]  Glucose, capillary     Status: Abnormal   Collection Time: 06/28/22  9:15 PM  Result Value Ref Range   Glucose-Capillary 148 (H) 70 - 99 mg/dL    Comment: Glucose reference range applies only to samples taken after fasting for at least 8 hours.   Comment 1 Notify RN    Comment 2 Document in Chart   CBC     Status: Abnormal   Collection Time: 06/29/22  4:53 AM  Result Value Ref Range   WBC 23.3 (H) 4.0 - 10.5 K/uL   RBC  3.74 (L) 4.22 - 5.81 MIL/uL   Hemoglobin 10.4 (L) 13.0 - 17.0 g/dL   HCT 63.0 (L) 16.0 - 10.9 %   MCV 75.7 (L) 80.0 - 100.0 fL   MCH 27.8 26.0 - 34.0 pg   MCHC 36.7 (H) 30.0 - 36.0 g/dL   RDW 32.3 55.7 - 32.2 %   Platelets 437 (H) 150 - 400 K/uL   nRBC 0.0 0.0 - 0.2 %    Comment: Performed at [facility], [address], [place], [place] [address]  Basic metabolic panel     Status: Abnormal   Collection Time: 06/29/22  4:53 AM  Result Value Ref Range   Sodium 129 (L) 135 - 145 mmol/L   Potassium 3.5 3.5 - 5.1 mmol/L   Chloride 100 98 - 111 mmol/L   CO2 21 (L) 22 - 32 mmol/L   Glucose, Bld 191 (H) 70 - 99 mg/dL    Comment: Glucose reference range applies only to samples taken after fasting for at least 8 hours.   BUN 10 6 - 20 mg/dL   Creatinine, Ser 7.06 0.61 - 1.24 mg/dL   Calcium 7.8 (L) 8.9 - 10.3 mg/dL   GFR, Estimated >23 >76 mL/min    Comment: (NOTE) Calculated using the CKD-EPI Creatinine Equation (2021)    Anion gap 8 5 - 15    Comment: Performed at [facility], [address], [place], [place] [address]  Vancomycin, trough     Status: Abnormal   Collection Time: 06/29/22  4:53 AM  Result Value Ref Range   Vancomycin Tr 10 (L) 15 - 20 ug/mL    Comment: Performed at [facility], [address], [place], [place] [address]  Glucose, capillary     Status: Abnormal   Collection Time: 06/29/22  6:21 AM  Result Value Ref Range   Glucose-Capillary 173 (H) 70 - 99 mg/dL    Comment: Glucose reference range applies only to samples taken after fasting for at least 8 hours.   Comment 1 Notify RN    Comment 2 Document in Chart   Glucose, capillary     Status: Abnormal   Collection Time: 06/29/22 11:45 AM  Result Value Ref Range   Glucose-Capillary 165 (H) 70 - 99 mg/dL    Comment: Glucose reference range applies only to samples taken after fasting for at least 8 hours.  Glucose, capillary     Status: Abnormal   Collection Time: 06/29/22  4:49 PM  Result  Value Ref Range   Glucose-Capillary 166 (H) 70 - 99 mg/dL    Comment: Glucose reference range applies only to samples taken after fasting for at least 8 hours.  Glucose, capillary  Status: Abnormal   Collection Time: 06/29/22  8:59 PM  Result Value Ref Range   Glucose-Capillary 149 (H) 70 - 99 mg/dL    Comment: Glucose reference range applies only to samples taken after fasting for at least 8 hours.  Glucose, capillary     Status: Abnormal   Collection Time: 06/30/22  5:45 AM  Result Value Ref Range   Glucose-Capillary 147 (H) 70 - 99 mg/dL    Comment: Glucose reference range applies only to samples taken after fasting for at least 8 hours.   Comment 1 Notify RN    Comment 2 Document in Chart   Basic metabolic panel     Status: Abnormal   Collection Time: 06/30/22  8:59 AM  Result Value Ref Range   Sodium 133 (L) 135 - 145 mmol/L   Potassium 3.3 (L) 3.5 - 5.1 mmol/L   Chloride 100 98 - 111 mmol/L   CO2 22 22 - 32 mmol/L   Glucose, Bld 131 (H) 70 - 99 mg/dL    Comment: Glucose reference range applies only to samples taken after fasting for at least 8 hours.   BUN 9 6 - 20 mg/dL   Creatinine, Ser 8.34 0.61 - 1.24 mg/dL   Calcium 8.2 (L) 8.9 - 10.3 mg/dL   GFR, Estimated >19 >62 mL/min    Comment: (NOTE) Calculated using the CKD-EPI Creatinine Equation (2021)    Anion gap 11 5 - 15    Comment: Performed at [facility], [address], [place], [place] [address]  Glucose, capillary     Status: Abnormal   Collection Time: 06/30/22 11:38 AM  Result Value Ref Range   Glucose-Capillary 134 (H) 70 - 99 mg/dL    Comment: Glucose reference range applies only to samples taken after fasting for at least 8 hours.   No results found.    Assessment/Plan L chest wall/breast abscess - CT 11/30 with dermal thickening of left breast and soft tissue edema, borderline enlarged left axillary lymph nodes - L axillary Korea 12/4 with 6 cm complicated fluid collection likely representing  abscess - s/p US guided aspiration 12/5 which yielded only 4 cc cloudy brown fluid and collection was not able to be decompressed - WBC 23K yesterday, febrile to 102.8 today  - recommend I&D in the OR - pt just ate so will plan for tomorrow AM  FEN: CM diet, NPO after MN VTE: LMWH ID: [id number]   - below per TRH -  T2DM, uncontrolled - A1c 11.6, SSI Alcohol abuse Tobacco abuse Obesity class II   I reviewed hospitalist notes, last 24 h vitals and pain scores, last 48 h intake and output, last 24 h labs and trends, and last 24 h imaging results.   Franne Forts, PA-C [facility] 06/30/2022, 3:09 PM Please see Amion for pager number during day hours 7:00am-4:30pm

## 2022-06-30 NOTE — Progress Notes (Signed)
Provider notified that pt sustained an increased heart rate in the 140's, with a fever. When pt was assessed the pt was seen crying and the pt stated he was having chest pain and and feeling short of breath. An EKG was done and reviewed by provider. Provider gave a verbal order to give PRN Metoprolol and ordered PRN Xanax. After helping the pt to calm down and control his breathing PRN Metoprolol and Xanax was given. Heart rate returned to baseline 100's and pt stated he felt less anxious.

## 2022-06-30 NOTE — Progress Notes (Addendum)
Subjective:  He was very frustrated this morning.  Partly his frustrated still being in pain he was frustrated about the fact that he would need to move out of the 2C unit now that his condition is improved.  He also is still having significant pain though he says he is having copious drainage overnight.     Antibiotics:  Anti-infectives (From admission, onward)    Start     Dose/Rate Route Frequency Ordered Stop   06/29/22 1200  ceFEPIme (MAXIPIME) 2 g in sodium chloride 0.9 % 100 mL IVPB        2 g 200 mL/hr over 30 Minutes Intravenous Every 8 hours 06/29/22 1005     06/29/22 1100  metroNIDAZOLE (FLAGYL) tablet 500 mg        500 mg Oral Every 12 hours 06/29/22 1005     06/28/22 1200  Ampicillin-Sulbactam (UNASYN) 3 g in sodium chloride 0.9 % 100 mL IVPB  Status:  Discontinued        3 g 200 mL/hr over 30 Minutes Intravenous Every 6 hours 06/28/22 0911 06/29/22 1005   06/24/22 0600  ceFEPIme (MAXIPIME) 2 g in sodium chloride 0.9 % 100 mL IVPB  Status:  Discontinued        2 g 200 mL/hr over 30 Minutes Intravenous Every 8 hours 06/23/22 2041 06/28/22 0911   06/24/22 0300  vancomycin (VANCOREADY) IVPB 1250 mg/250 mL  Status:  Discontinued        1,250 mg 166.7 mL/hr over 90 Minutes Intravenous Every 12 hours 06/23/22 1543 06/29/22 0819   06/23/22 2100  ceFEPIme (MAXIPIME) 2 g in sodium chloride 0.9 % 100 mL IVPB        2 g 200 mL/hr over 30 Minutes Intravenous  Once 06/23/22 2006 06/23/22 2153   06/23/22 2100  metroNIDAZOLE (FLAGYL) IVPB 500 mg  Status:  Discontinued        500 mg 100 mL/hr over 60 Minutes Intravenous Every 12 hours 06/23/22 2006 06/28/22 0849   06/23/22 1600  vancomycin (VANCOREADY) IVPB 1500 mg/300 mL        1,500 mg 150 mL/hr over 120 Minutes Intravenous  Once 06/23/22 1514 06/23/22 1912   06/23/22 1430  vancomycin (VANCOCIN) IVPB 1000 mg/200 mL premix        1,000 mg 200 mL/hr over 60 Minutes Intravenous  Once 06/23/22 1418 06/23/22 1624    06/23/22 1430  cefTRIAXone (ROCEPHIN) 2 g in sodium chloride 0.9 % 100 mL IVPB        2 g 200 mL/hr over 30 Minutes Intravenous  Once 06/23/22 1418 06/23/22 1506       Medications: Scheduled Meds:  enoxaparin (LOVENOX) injection  55 mg Subcutaneous Q24H   feeding supplement  237 mL Oral BID WC   folic acid  1 mg Oral Daily   insulin aspart  0-15 Units Subcutaneous TID WC   insulin aspart  0-5 Units Subcutaneous QHS   insulin aspart protamine- aspart  16 Units Subcutaneous BID WC   lidocaine (PF)  2 mL Intradermal Once   metroNIDAZOLE  500 mg Oral Q12H   multivitamin with minerals  1 tablet Oral Daily   nicotine  14 mg Transdermal Daily   polyethylene glycol  17 g Oral Daily   potassium chloride  40 mEq Oral BID   thiamine  100 mg Oral Daily   Continuous Infusions:  ceFEPime (MAXIPIME) IV 2 g (06/30/22 0606)   PRN Meds:.acetaminophen **OR** acetaminophen, HYDROmorphone (  DILAUDID) injection, ibuprofen, metoCLOPramide (REGLAN) injection, metoprolol tartrate, ondansetron **OR** ondansetron (ZOFRAN) IV, mouth rinse, oxyCODONE    Objective: Weight change:   Intake/Output Summary (Last 24 hours) at 06/30/2022 1154 Last data filed at 06/29/2022 2006 Gross per 24 hour  Intake 977.79 ml  Output --  Net 977.79 ml   Blood pressure 134/81, pulse 100, temperature (!) 102.8 F (39.3 C), temperature source Oral, resp. rate 20, height 5\' 9"  (1.753 m), weight 116.8 kg, SpO2 98 %. Temp:  [97.7 F (36.5 C)-102.8 F (39.3 C)] 102.8 F (39.3 C) (12/07 1140) Pulse Rate:  [89-107] 100 (12/07 0346) Resp:  [14-20] 20 (12/07 1140) BP: (109-140)/(66-83) 134/81 (12/07 1140) SpO2:  [97 %-98 %] 98 % (12/07 1140)  Physical Exam: Physical Exam Constitutional:      Appearance: He is well-developed.  HENT:     Head: Normocephalic and atraumatic.  Eyes:     Conjunctiva/sclera: Conjunctivae normal.  Cardiovascular:     Rate and Rhythm: Normal rate and regular rhythm.  Pulmonary:     Effort:  Pulmonary effort is normal. No respiratory distress.     Breath sounds: Normal breath sounds. No stridor. No wheezing.  Abdominal:     General: There is no distension.     Palpations: Abdomen is soft.  Musculoskeletal:        General: Normal range of motion.     Cervical back: Normal range of motion and neck supple.  Skin:    General: Skin is warm and dry.     Findings: Erythema present. No rash.  Neurological:     General: No focal deficit present.     Mental Status: He is alert and oriented to person, place, and time.  Psychiatric:        Attention and Perception: Attention and perception normal.        Mood and Affect: Mood is anxious.        Behavior: Behavior normal.        Thought Content: Thought content normal.        Cognition and Memory: Cognition and memory normal.        Judgment: Judgment normal.   Chest bandaged CBC:    BMET Recent Labs    06/29/22 0453 06/30/22 0859  NA 129* 133*  K 3.5 3.3*  CL 100 100  CO2 21* 22  GLUCOSE 191* 131*  BUN 10 9  CREATININE 0.88 0.76  CALCIUM 7.8* 8.2*      Liver Panel  No results for input(s): "PROT", "ALBUMIN", "AST", "ALT", "ALKPHOS", "BILITOT", "BILIDIR", "IBILI" in the last 72 hours.     Sedimentation Rate No results for input(s): "ESRSEDRATE" in the last 72 hours. C-Reactive Protein No results for input(s): "CRP" in the last 72 hours.  Micro Results: Recent Results (from the past 720 hour(s))  MRSA Next Gen by PCR, Nasal     Status: None   Collection Time: 06/23/22 12:36 AM   Specimen: Nasal Mucosa; Nasal Swab  Result Value Ref Range Status   MRSA by PCR Next Gen NOT DETECTED NOT DETECTED Final    Comment: (NOTE) The GeneXpert MRSA Assay (FDA approved for NASAL specimens only), is one component of a comprehensive MRSA colonization surveillance program. It is not intended to diagnose MRSA infection nor to guide or monitor treatment for MRSA infections. Test performance is not FDA approved in  patients less than 69 years old. Performed at [facility], [address], [place], [place]   Blood culture (  routine x 2)     Status: None   Collection Time: 06/23/22  1:40 PM   Specimen: BLOOD RIGHT ARM  Result Value Ref Range Status   Specimen Description BLOOD RIGHT ARM  Final   Special Requests   Final    BOTTLES DRAWN AEROBIC AND ANAEROBIC Blood Culture results may not be optimal due to an excessive volume of blood received in culture bottles   Culture   Final    NO GROWTH 5 DAYS Performed at [facility], [address], [place], [place] [address]    Report Status 06/28/2022 FINAL  Final  Blood culture (routine x 2)     Status: None   Collection Time: 06/23/22  4:10 PM   Specimen: BLOOD  Result Value Ref Range Status   Specimen Description BLOOD SITE NOT SPECIFIED  Final   Special Requests   Final    BOTTLES DRAWN AEROBIC AND ANAEROBIC Blood Culture results may not be optimal due to an excessive volume of blood received in culture bottles   Culture   Final    NO GROWTH 5 DAYS Performed at [facility], [address], [place], [place] [address]    Report Status 06/28/2022 FINAL  Final  SARS Coronavirus 2 by RT PCR (hospital order, performed in [facility] hospital lab) *cepheid single result test* Anterior Nasal Swab     Status: None   Collection Time: 06/25/22  2:26 PM   Specimen: Anterior Nasal Swab  Result Value Ref Range Status   SARS Coronavirus 2 by RT PCR NEGATIVE NEGATIVE Final    Comment: (NOTE) SARS-CoV-2 target nucleic acids are NOT DETECTED.  The SARS-CoV-2 RNA is generally detectable in upper and lower respiratory specimens during the acute phase of infection. The lowest concentration of SARS-CoV-2 viral copies this assay can detect is 250 copies / mL. A negative result does not preclude SARS-CoV-2 infection and should not be used as the sole basis for treatment or other patient management decisions.  A negative result may occur  with improper specimen collection / handling, submission of specimen other than nasopharyngeal swab, presence of viral mutation(s) within the areas targeted by this assay, and inadequate number of viral copies (<250 copies / mL). A negative result must be combined with clinical observations, patient history, and epidemiological information.  Fact Sheet for Patients:   RoadLapTop.co.za  Fact Sheet for Healthcare Providers: http://kim-miller.com/  This test is not yet approved or  cleared by the [place] FDA and has been authorized for detection and/or diagnosis of SARS-CoV-2 by FDA under an Emergency Use Authorization (EUA).  This EUA will remain in effect (meaning this test can be used) for the duration of the COVID-19 declaration under Section 564(b)(1) of the Act, 21 U.S.C. section 360bbb-3(b)(1), unless the authorization is terminated or revoked sooner.  Performed at [facility], [address], [place], [place] [address]   Aerobic/Anaerobic Culture w Gram Stain (surgical/deep wound)     Status: None (Preliminary result)   Collection Time: 06/28/22 11:53 AM   Specimen: Abscess  Result Value Ref Range Status   Specimen Description ABSCESS  Final   Special Requests LEFT BREAST  Final   Gram Stain   Final    ABUNDANT WBC PRESENT,BOTH PMN AND MONONUCLEAR ABUNDANT GRAM NEGATIVE RODS    Culture   Final    NO GROWTH < 24 HOURS Performed at [facility], [address], [place], [place] [address]    Report  Status PENDING  Incomplete    Studies/Results: [place] FINE NEEDLE ASP 1ST LESION  Result Date: 06/28/2022 INDICATION: 36 year old male with left breast abscess and possible hidradenitis suppurative. EXAM: ULTRASOUND-GUIDED ASPIRATION OF LEFT BREAST ABSCESS MEDICATIONS: Moderate sedation ANESTHESIA/SEDATION: Moderate (conscious) sedation was employed during this procedure. A total of Versed 1.0mg  and fentanyl 50 mcg was  administered intravenously at the order of the provider performing the procedure. Total intra-service moderate sedation time: 19 minutes. Patient's level of consciousness and vital signs were monitored continuously by radiology nurse throughout the procedure under the supervision of the provider performing the procedure. COMPLICATIONS: None immediate. PROCEDURE: Informed written consent was obtained from the patient after a thorough discussion of the procedural risks, benefits and alternatives. All questions were addressed. A timeout was performed prior to the initiation of the procedure. Left breast tissue was evaluated with ultrasound. Multiple areas throughout the left breast are erythematous with induration. Complex fluid collection near the 5 o'clock position was targeted for aspiration. This area was prepped with Betadine and sterile field was created. Skin was anesthetized using 1% lidocaine. Using ultrasound guidance, a Yueh catheter was directed into the complex collections. The Yueh catheter was repositioned multiple times. Only 4 mL cloudy brown fluid could be aspirated. Bandage placed over the puncture site. FINDINGS: Soft tissues throughout the left breast are diffusely edematous. Complex fluid collection in the 5 o'clock position was targeted. Despite having a Yueh catheter well positioned in the complex fluid collection, only 4 mL of cloudy brown fluid could be removed. Collection could not be decompressed. IMPRESSION: Ultrasound guided aspiration of complex fluid in the left breast. Fluid was sent for culture. Electronically Signed   By: Richarda Overlie M.D.   On: 06/28/2022 14:03      Assessment/Plan:  INTERVAL HISTORY: Cultures still unrevealing  Principal Problem:   Cellulitis of chest wall Active Problems:   Hyperglycemia   Hyponatremia   Tobacco abuse   Alcohol abuse   Breast abscess   Hidradenitis suppurativa   Dental infection   Morbid obesity (HCC)   Type 2 diabetes mellitus  with hyperglycemia (HCC)    CLEARANCE CHENAULT is a 36 y.o. male with morbid obesity newly diagnosed noticed diabetes mellitus likely retinitis suppurativa with recurrent infections in his soft tissues in particular axilla groin now admitted with eft soft tissue breast infection   #1 Left breast abscess:  Culture still not yielding organism we will continue with cefepime and Flagyl.  If he does not improve clinically with the drainage performed by IR on the spontaneous drainage that is occurring with continued antibiotics he will need reevaluation by general surgery.  2.  Hidradenitis suppurativa he will need to be plugged in with dermatology as an outpatient  I spent 52 minutes with the patient including than 50% of the time in face to face counseling of the patient is left breast abscess regarding the nature of this regarding definitions of sepsis,  along with review of medical records in preparation for the visit and during the visit and in coordination of his care.    LOS: 7 days   Acey Lav 06/30/2022, 11:54 AM

## 2022-06-30 NOTE — Consult Note (Signed)
WOC requested via page from staff to re-evaluate axilla/breast wound. [place] and fine needle aspirations have been ordered and I reviewed findings. ID involved. Unclear etiology, but infectious and progressing rapidly. Concerning for breast cancer or necrotizing skin disease.  Recommend general surgery consultation.  The care needed for this progressive wound is outside of the scope of practice for the [facility] nurse.  Current dressings while non currative are non adherent for a painful wound if signigicant drainage is an issue I have asked bedside nursing to let me know and I can certainly add more absorptive types of dressings such as alginates, Hydrofiber or hydroconductives    Re consult if needed, will not follow at this time. Thanks  Sharaya Boruff [employer], RN,CWOCN, CNS, CWON-AP [phone])

## 2022-06-30 NOTE — Progress Notes (Signed)
[facility] PROGRESS NOTE    Progress Note  Caleb Hartshornaron L Biagini  WGN:[id number] DOB: 05/22/1986 DOA: 06/23/2022 PCP: Patient, [name]     Brief Narrative:   Caleb HartshornAaron L [name] is an 36 y.o. malepast medical history boils in his armpit, alcohol use comes in with left wall cellulitis and sepsis CT scan of the chest did not show any abscess was started empirically on IV antibiotics and IV fluids ultrasound showed possible abscess IR was consulted and has been draining wound care and ID has been consulted   Assessment/Plan:   Sepsis due to cellulitis of chest wall/breast abscess CT of the chest showed no evidence of abscess, but ultrasound showed possible abscess. General surgery was consulted recommended IR for drain aspiration which was done 06/28/2022 culture from aspirate is growing gram-negative rods MRSA PCR was negative. Blood cultures have been negative till date ID was consulted currently on IV cefepime and Flagyl. Tmax of 101.6 continues to have a leukocytosis of 23,000. Vitals have been stable, mildly tachycardic.  Diagnosed diabetes mellitus type 2 uncontrolled hyperglycemia: A1c of 11.6. Daily long-acting insulin per sliding scale blood glucose fairly controlled.  Hypovolemic hyponatremia: Improving with fluid resuscitation.  Alcohol abuse: Started on thiamine and folate has been counseled.  Tobacco abuse: Has been counseled nicotine patch.   DVT prophylaxis: lovenox Family Communication:none Status is: Inpatient Remains inpatient appropriate because: Sepsis due to left breast cellulitis    Code Status:     Code Status Orders  (From admission, onward)           Start     Ordered   06/23/22 2002  Full code  Continuous        06/23/22 2006           Code Status History     This patient has a current code status but no historical code status.         IV Access:   Peripheral IV   Procedures and diagnostic studies:   US FINE NEEDLE ASP  1ST LESION  Result Date: 06/28/2022 INDICATION: 36 year old male with left breast abscess and possible hidradenitis suppurative. EXAM: ULTRASOUND-GUIDED ASPIRATION OF LEFT BREAST ABSCESS MEDICATIONS: Moderate sedation ANESTHESIA/SEDATION: Moderate (conscious) sedation was employed during this procedure. A total of Versed 1.0mg  and fentanyl 50 mcg was administered intravenously at the order of the provider performing the procedure. Total intra-service moderate sedation time: 19 minutes. Patient's level of consciousness and vital signs were monitored continuously by radiology nurse throughout the procedure under the supervision of the provider performing the procedure. COMPLICATIONS: None immediate. PROCEDURE: Informed written consent was obtained from the patient after a thorough discussion of the procedural risks, benefits and alternatives. All questions were addressed. A timeout was performed prior to the initiation of the procedure. Left breast tissue was evaluated with ultrasound. Multiple areas throughout the left breast are erythematous with induration. Complex fluid collection near the 5 o'clock position was targeted for aspiration. This area was prepped with Betadine and sterile field was created. Skin was anesthetized using 1% lidocaine. Using ultrasound guidance, a Yueh catheter was directed into the complex collections. The Yueh catheter was repositioned multiple times. Only 4 mL cloudy brown fluid could be aspirated. Bandage placed over the puncture site. FINDINGS: Soft tissues throughout the left breast are diffusely edematous. Complex fluid collection in the 5 o'clock position was targeted. Despite having a Yueh catheter well positioned in the complex fluid collection, only 4 mL of cloudy brown fluid could be removed. Collection could  not be decompressed. IMPRESSION: Ultrasound guided aspiration of complex fluid in the left breast. Fluid was sent for culture. Electronically Signed   By: Richarda Overlie  M.D.   On: 06/28/2022 14:03     Medical Consultants:   None.   Subjective:    [name] pain is controlled.  Objective:    Vitals:   06/29/22 1306 06/29/22 1953 06/29/22 2357 06/30/22 0346  BP:  109/83 126/75 128/77  Pulse:  (!) 107 89 100  Resp:  19 14 16   Temp: (!) 101.6 F (38.7 C) 97.7 F (36.5 C) 98.4 F (36.9 C) 99.1 F (37.3 C)  TempSrc:  Oral Oral Oral  SpO2:  98%  97%  Weight:      Height:       SpO2: 97 % O2 Flow Rate (L/min): 2 L/min   Intake/Output Summary (Last 24 hours) at 06/30/2022 0731 Last data filed at 06/29/2022 2006 Gross per 24 hour  Intake 977.79 ml  Output --  Net 977.79 ml   Filed Weights   06/23/22 1330 06/28/22 0441  Weight: 111.1 kg 116.8 kg    Exam: General exam: In no acute distress. Respiratory system: Good air movement and clear to auscultation. Cardiovascular system: S1 & S2 heard, RRR. No JVD. Gastrointestinal system: Abdomen is nondistended, soft and nontender.  Skin: Erythematous with macerated skin of the left breast extending anteriorly and posteriorly.  Psychiatry: Judgement and insight appear normal. Mood & affect appropriate.    Data Reviewed:    Labs: Basic Metabolic Panel: Recent Labs  Lab 06/24/22 0036 06/25/22 0230 06/26/22 0136 06/27/22 0020 06/28/22 0023 06/29/22 0453  NA 128* 133* 132* 131* 133* 129*  K 3.8 3.9 3.5 3.4* 3.6 3.5  CL 99 99 104 103 103 100  CO2 21* 16* 18* 18* 20* 21*  GLUCOSE 260* 203* 199* 202* 232* 191*  BUN 7 5* 7 8 10 10   CREATININE 0.98 1.04 0.99 0.88 0.80 0.88  CALCIUM 8.1* 8.8* 7.8* 7.8* 8.3* 7.8*  MG 1.9  --   --   --   --   --    GFR Estimated Creatinine Clearance: 143.4 mL/min (by C-G formula based on SCr of 0.88 mg/dL). Liver Function Tests: No results for input(s): "AST", "ALT", "ALKPHOS", "BILITOT", "PROT", "ALBUMIN" in the last 168 hours. No results for input(s): "LIPASE", "AMYLASE" in the last 168 hours. No results for input(s): "AMMONIA" in the last 168  hours. Coagulation profile No results for input(s): "INR", "PROTIME" in the last 168 hours. COVID-19 Labs  No results for input(s): "DDIMER", "FERRITIN", "LDH", "CRP" in the last 72 hours.  Lab Results  Component Value Date   SARSCOV2NAA NEGATIVE 06/25/2022    CBC: Recent Labs  Lab 06/23/22 1348 06/24/22 0036 06/25/22 0230 06/26/22 0136 06/27/22 0020 06/28/22 0023 06/29/22 0453  WBC 21.2*   < > 23.1* 17.8* 20.3* 22.7* 23.3*  NEUTROABS 18.2*  --   --   --   --   --   --   HGB 15.8   < > 13.4 11.6* 11.3* 11.4* 10.4*  HCT 45.2   < > 37.2* 32.6* 30.7* 31.4* 28.3*  MCV 79.2*   < > 77.5* 77.1* 75.2* 75.8* 75.7*  PLT 311   < > 278 278 318 435* 437*   < > = values in this interval not displayed.   Cardiac Enzymes: No results for input(s): "CKTOTAL", "CKMB", "CKMBINDEX", "TROPONINI" in the last 168 hours. BNP (last 3 results) No results for input(s): "PROBNP"  in the last 8760 hours. CBG: Recent Labs  Lab 06/29/22 0621 06/29/22 1145 06/29/22 1649 06/29/22 2059 06/30/22 0545  GLUCAP 173* 165* 166* 149* 147*   D-Dimer: No results for input(s): "DDIMER" in the last 72 hours. Hgb A1c: No results for input(s): "HGBA1C" in the last 72 hours. Lipid Profile: No results for input(s): "CHOL", "HDL", "LDLCALC", "TRIG", "CHOLHDL", "LDLDIRECT" in the last 72 hours. Thyroid function studies: No results for input(s): "TSH", "T4TOTAL", "T3FREE", "THYROIDAB" in the last 72 hours.  Invalid input(s): "FREET3" Anemia work up: No results for input(s): "VITAMINB12", "FOLATE", "FERRITIN", "TIBC", "IRON", "RETICCTPCT" in the last 72 hours. Sepsis Labs: Recent Labs  Lab 06/23/22 1340 06/23/22 1348 06/24/22 0036 06/25/22 0230 06/26/22 0136 06/27/22 0020 06/28/22 0023 06/29/22 0453  PROCALCITON  --   --  0.21 1.35  --   --   --   --   WBC  --    < > 22.2* 23.1* 17.8* 20.3* 22.7* 23.3*  LATICACIDVEN 1.9  --   --   --   --   --   --   --    < > = values in this interval not displayed.    Microbiology Recent Results (from the past 240 hour(s))  MRSA Next Gen by PCR, Nasal     Status: None   Collection Time: 06/23/22 12:36 AM   Specimen: Nasal Mucosa; Nasal Swab  Result Value Ref Range Status   MRSA by PCR Next Gen NOT DETECTED NOT DETECTED Final    Comment: (NOTE) The GeneXpert MRSA Assay (FDA approved for NASAL specimens only), is one component of a comprehensive MRSA colonization surveillance program. It is not intended to diagnose MRSA infection nor to guide or monitor treatment for MRSA infections. Test performance is not FDA approved in patients less than 22 years old. Performed at [facility], [address] [place], [place] [address]   Blood culture (routine x 2)     Status: None   Collection Time: 06/23/22  1:40 PM   Specimen: BLOOD RIGHT ARM  Result Value Ref Range Status   Specimen Description BLOOD RIGHT ARM  Final   Special Requests   Final    BOTTLES DRAWN AEROBIC AND ANAEROBIC Blood Culture results may not be optimal due to an excessive volume of blood received in culture bottles   Culture   Final    NO GROWTH 5 DAYS Performed at [facility], [address] [place], [place] [address]    Report Status 06/28/2022 FINAL  Final  Blood culture (routine x 2)     Status: None   Collection Time: 06/23/22  4:10 PM   Specimen: BLOOD  Result Value Ref Range Status   Specimen Description BLOOD SITE NOT SPECIFIED  Final   Special Requests   Final    BOTTLES DRAWN AEROBIC AND ANAEROBIC Blood Culture results may not be optimal due to an excessive volume of blood received in culture bottles   Culture   Final    NO GROWTH 5 DAYS Performed at [facility], [address] [place], [place] [address]    Report Status 06/28/2022 FINAL  Final  SARS Coronavirus 2 by RT PCR (hospital order, performed in [facility] hospital lab) *cepheid single result test* Anterior Nasal Swab     Status: None   Collection Time: 06/25/22  2:26 PM   Specimen:  Anterior Nasal Swab  Result Value Ref Range Status   SARS Coronavirus 2 by RT PCR  NEGATIVE NEGATIVE Final    Comment: (NOTE) SARS-CoV-2 target nucleic acids are NOT DETECTED.  The SARS-CoV-2 RNA is generally detectable in upper and lower respiratory specimens during the acute phase of infection. The lowest concentration of SARS-CoV-2 viral copies this assay can detect is 250 copies / mL. A negative result does not preclude SARS-CoV-2 infection and should not be used as the sole basis for treatment or other patient management decisions.  A negative result may occur with improper specimen collection / handling, submission of specimen other than nasopharyngeal swab, presence of viral mutation(s) within the areas targeted by this assay, and inadequate number of viral copies (<250 copies / mL). A negative result must be combined with clinical observations, patient history, and epidemiological information.  Fact Sheet for Patients:   RoadLapTop.co.za  Fact Sheet for Healthcare Providers: http://kim-miller.com/  This test is not yet approved or  cleared by the [place] FDA and has been authorized for detection and/or diagnosis of SARS-CoV-2 by FDA under an Emergency Use Authorization (EUA).  This EUA will remain in effect (meaning this test can be used) for the duration of the COVID-19 declaration under Section 564(b)(1) of the Act, 21 U.S.C. section 360bbb-3(b)(1), unless the authorization is terminated or revoked sooner.  Performed at [facility], [address] [place], [place] [address]   Aerobic/Anaerobic Culture w Gram Stain (surgical/deep wound)     Status: None (Preliminary result)   Collection Time: 06/28/22 11:53 AM   Specimen: Abscess  Result Value Ref Range Status   Specimen Description ABSCESS  Final   Special Requests LEFT BREAST  Final   Gram Stain   Final    ABUNDANT WBC PRESENT,BOTH PMN AND  MONONUCLEAR ABUNDANT GRAM NEGATIVE RODS    Culture   Final    NO GROWTH < 24 HOURS Performed at [facility], [address], [place], [place] [address]    Report Status PENDING  Incomplete     Medications:    enoxaparin (LOVENOX) injection  55 mg Subcutaneous Q24H   feeding supplement  237 mL Oral BID WC   folic acid  1 mg Oral Daily   insulin aspart  0-15 Units Subcutaneous TID WC   insulin aspart  0-5 Units Subcutaneous QHS   insulin aspart protamine- aspart  16 Units Subcutaneous BID WC   lidocaine (PF)  2 mL Intradermal Once   metroNIDAZOLE  500 mg Oral Q12H   multivitamin with minerals  1 tablet Oral Daily   nicotine  14 mg Transdermal Daily   polyethylene glycol  17 g Oral Daily   thiamine  100 mg Oral Daily   Continuous Infusions:  ceFEPime (MAXIPIME) IV 2 g (06/30/22 0606)      LOS: 7 days   Marinda Elk  [facility]  06/30/2022, 7:31 AM

## 2022-06-30 NOTE — Progress Notes (Signed)
       Pictures of Breast abscess today [id number]:          [name] 06/30/2022, 2:36 PM

## 2022-07-01 ENCOUNTER — Encounter (HOSPITAL_COMMUNITY): Payer: Self-pay | Admitting: Internal Medicine

## 2022-07-01 ENCOUNTER — Inpatient Hospital Stay (HOSPITAL_COMMUNITY): Payer: No Typology Code available for payment source | Admitting: Certified Registered"

## 2022-07-01 ENCOUNTER — Other Ambulatory Visit: Payer: Self-pay

## 2022-07-01 ENCOUNTER — Encounter (HOSPITAL_COMMUNITY)
Admission: EM | Disposition: A | Discharge status: Home Health | Payer: Self-pay | Source: Home / Self Care | Attending: Internal Medicine

## 2022-07-01 DIAGNOSIS — L0291 Cutaneous abscess, unspecified: Secondary | ICD-10-CM | POA: Diagnosis not present

## 2022-07-01 DIAGNOSIS — E1165 Type 2 diabetes mellitus with hyperglycemia: Secondary | ICD-10-CM | POA: Diagnosis not present

## 2022-07-01 DIAGNOSIS — L02213 Cutaneous abscess of chest wall: Secondary | ICD-10-CM

## 2022-07-01 DIAGNOSIS — N611 Abscess of the breast and nipple: Secondary | ICD-10-CM | POA: Diagnosis not present

## 2022-07-01 DIAGNOSIS — A419 Sepsis, unspecified organism: Secondary | ICD-10-CM | POA: Diagnosis not present

## 2022-07-01 DIAGNOSIS — L732 Hidradenitis suppurativa: Secondary | ICD-10-CM | POA: Diagnosis not present

## 2022-07-01 DIAGNOSIS — L03313 Cellulitis of chest wall: Secondary | ICD-10-CM | POA: Diagnosis not present

## 2022-07-01 HISTORY — PX: INCISION AND DRAINAGE ABSCESS: SHX5864

## 2022-07-01 LAB — BASIC METABOLIC PANEL
Anion gap: 10 (ref 5–15)
BUN: 7 mg/dL (ref 6–20)
CO2: 22 mmol/L (ref 22–32)
Calcium: 8.1 mg/dL — ABNORMAL LOW (ref 8.9–10.3)
Chloride: 101 mmol/L (ref 98–111)
Creatinine, Ser: 0.84 mg/dL (ref 0.61–1.24)
GFR, Estimated: >60 mL/min (ref >60)
Glucose, Bld: 134 mg/dL — ABNORMAL HIGH (ref 70–99)
Potassium: 4.1 mmol/L (ref 3.5–5.1)
Sodium: 133 mmol/L — ABNORMAL LOW (ref 135–145)

## 2022-07-01 LAB — GLUCOSE, CAPILLARY
Glucose-Capillary: 135 mg/dL — ABNORMAL HIGH (ref 70–99)
Glucose-Capillary: 146 mg/dL — ABNORMAL HIGH (ref 70–99)
Glucose-Capillary: 149 mg/dL — ABNORMAL HIGH (ref 70–99)
Glucose-Capillary: 122 mg/dL — ABNORMAL HIGH (ref 70–99)
Glucose-Capillary: 183 mg/dL — ABNORMAL HIGH (ref 70–99)

## 2022-07-01 SURGERY — INCISION AND DRAINAGE, ABSCESS
Anesthesia: General | Laterality: Left

## 2022-07-01 MED ORDER — ORAL CARE MOUTH RINSE: 15.0000 mL | Freq: Once | OROMUCOSAL | Status: AC

## 2022-07-01 MED ORDER — CHLORHEXIDINE GLUCONATE 0.12 % MT SOLN: 15.0000 mL | Freq: Once | OROMUCOSAL | Status: AC

## 2022-07-01 MED ORDER — HYDROMORPHONE HCL 1 MG/ML IJ SOLN
0.5000 mg | INTRAMUSCULAR | Status: DC | PRN
  Administered 2022-07-01 – 2022-07-02 (×2): 1 mg via INTRAVENOUS
  Administered 2022-07-04: 0.5 mg via INTRAVENOUS
  Filled 2022-07-01 (×4): qty 1

## 2022-07-01 MED ORDER — LIDOCAINE 2% (20 MG/ML) 5 ML SYRINGE
INTRAMUSCULAR | Status: DC | PRN
  Administered 2022-07-01: 80 mg via INTRAVENOUS

## 2022-07-01 MED ORDER — LACTATED RINGERS IV SOLN: INTRAVENOUS | Status: DC

## 2022-07-01 MED ORDER — ONDANSETRON HCL 4 MG/2ML IJ SOLN
INTRAMUSCULAR | Status: DC | PRN
  Administered 2022-07-01: 4 mg via INTRAVENOUS

## 2022-07-01 MED ORDER — FENTANYL CITRATE (PF) 100 MCG/2ML IJ SOLN: 25.0000 ug | INTRAMUSCULAR | Status: DC | PRN

## 2022-07-01 MED ORDER — FENTANYL CITRATE (PF) 250 MCG/5ML IJ SOLN
INTRAMUSCULAR | Status: DC | PRN
  Administered 2022-07-01 (×5): 50 ug via INTRAVENOUS

## 2022-07-01 MED ORDER — PROPOFOL 10 MG/ML IV BOLUS
INTRAVENOUS | Status: AC
  Filled 2022-07-01: qty 20

## 2022-07-01 MED ORDER — PROPOFOL 10 MG/ML IV BOLUS
INTRAVENOUS | Status: DC | PRN
  Administered 2022-07-01: 200 mg via INTRAVENOUS
  Administered 2022-07-01 (×2): 100 mg via INTRAVENOUS

## 2022-07-01 MED ORDER — 0.9 % SODIUM CHLORIDE (POUR BTL) OPTIME
TOPICAL | Status: DC | PRN
  Administered 2022-07-01: 1000 mL

## 2022-07-01 MED ORDER — ACETAMINOPHEN 500 MG PO TABS: 1000.0000 mg | ORAL_TABLET | Freq: Once | ORAL | Status: DC | PRN

## 2022-07-01 MED ORDER — ACETAMINOPHEN 10 MG/ML IV SOLN: 1000.0000 mg | Freq: Once | INTRAVENOUS | Status: DC | PRN

## 2022-07-01 MED ORDER — DEXMEDETOMIDINE HCL IN NACL 80 MCG/20ML IV SOLN
INTRAVENOUS | Status: DC | PRN
  Administered 2022-07-01: 4 ug via BUCCAL
  Administered 2022-07-01 (×2): 8 ug via BUCCAL

## 2022-07-01 MED ORDER — MIDAZOLAM HCL 2 MG/2ML IJ SOLN
INTRAMUSCULAR | Status: AC
  Filled 2022-07-01: qty 2

## 2022-07-01 MED ORDER — INSULIN ASPART 100 UNIT/ML IJ SOLN
0.0000 [IU] | INTRAMUSCULAR | Status: DC | PRN
  Administered 2022-07-01: 2 [IU] via SUBCUTANEOUS

## 2022-07-01 MED ORDER — CHLORHEXIDINE GLUCONATE 0.12 % MT SOLN
OROMUCOSAL | Status: AC
  Administered 2022-07-01: 15 mL via OROMUCOSAL
  Filled 2022-07-01: qty 15

## 2022-07-01 MED ORDER — JUVEN PO PACK
1.0000 | PACK | Freq: Two times a day (BID) | ORAL | Status: DC
  Administered 2022-07-01 – 2022-07-13 (×25): 1 via ORAL
  Filled 2022-07-01 (×25): qty 1

## 2022-07-01 MED ORDER — FENTANYL CITRATE (PF) 250 MCG/5ML IJ SOLN
INTRAMUSCULAR | Status: AC
  Filled 2022-07-01: qty 5

## 2022-07-01 MED ORDER — MIDAZOLAM HCL 2 MG/2ML IJ SOLN
INTRAMUSCULAR | Status: DC | PRN
  Administered 2022-07-01: 2 mg via INTRAVENOUS

## 2022-07-01 MED ORDER — OXYCODONE HCL 5 MG PO TABS
5.0000 mg | ORAL_TABLET | ORAL | Status: DC | PRN
  Administered 2022-07-01 – 2022-07-08 (×16): 10 mg via ORAL
  Administered 2022-07-08: 5 mg via ORAL
  Administered 2022-07-08 – 2022-07-09 (×2): 10 mg via ORAL
  Filled 2022-07-01: qty 1
  Filled 2022-07-01 (×21): qty 2

## 2022-07-01 MED ORDER — ACETAMINOPHEN 160 MG/5ML PO SOLN: 1000.0000 mg | Freq: Once | ORAL | Status: DC | PRN

## 2022-07-01 MED ORDER — OXYCODONE HCL 5 MG/5ML PO SOLN: 5.0000 mg | Freq: Once | ORAL | Status: DC | PRN

## 2022-07-01 MED ORDER — OXYCODONE HCL 5 MG PO TABS: 5.0000 mg | ORAL_TABLET | Freq: Once | ORAL | Status: DC | PRN

## 2022-07-01 MED ORDER — MORPHINE SULFATE (PF) 4 MG/ML IV SOLN
4.0000 mg | INTRAVENOUS | Status: DC | PRN
  Administered 2022-07-01 – 2022-07-12 (×17): 4 mg via INTRAVENOUS
  Filled 2022-07-01 (×18): qty 1

## 2022-07-01 SURGICAL SUPPLY — 33 items
BAG COUNTER SPONGE SURGICOUNT (BAG) ×2 IMPLANT
BAG SPNG CNTER NS LX DISP (BAG) ×1
BINDER BREAST 3XL (GAUZE/BANDAGES/DRESSINGS) IMPLANT
BNDG GAUZE DERMACEA FLUFF 4 (GAUZE/BANDAGES/DRESSINGS) IMPLANT
BNDG GZE DERMACEA 4 6PLY (GAUZE/BANDAGES/DRESSINGS) ×3
CANISTER SUCT 3000ML PPV (MISCELLANEOUS) ×2 IMPLANT
COVER SURGICAL LIGHT HANDLE (MISCELLANEOUS) ×2 IMPLANT
DRAPE CHEST BREAST 15X10 FENES (DRAPES) IMPLANT
DRAPE LAPAROSCOPIC ABDOMINAL (DRAPES) IMPLANT
DRAPE LAPAROTOMY 100X72 PEDS (DRAPES) IMPLANT
ELECT REM PT RETURN 9FT ADLT (ELECTROSURGICAL) ×1
ELECTRODE REM PT RTRN 9FT ADLT (ELECTROSURGICAL) ×2 IMPLANT
GAUZE PAD ABD 8X10 STRL (GAUZE/BANDAGES/DRESSINGS) IMPLANT
GAUZE SPONGE 4X4 12PLY STRL (GAUZE/BANDAGES/DRESSINGS) IMPLANT
GLOVE BIO SURGEON STRL SZ7.5 (GLOVE) ×2 IMPLANT
GLOVE BIOGEL PI IND STRL 6.5 (GLOVE) IMPLANT
GLOVE BIOGEL PI IND STRL 7.0 (GLOVE) IMPLANT
GOWN STRL REUS W/ TWL LRG LVL3 (GOWN DISPOSABLE) ×4 IMPLANT
GOWN STRL REUS W/TWL LRG LVL3 (GOWN DISPOSABLE) ×2
KIT BASIN OR (CUSTOM PROCEDURE TRAY) ×2 IMPLANT
KIT TURNOVER KIT B (KITS) ×2 IMPLANT
NDL HYPO 25GX1X1/2 BEV (NEEDLE) IMPLANT
NEEDLE HYPO 25GX1X1/2 BEV (NEEDLE) IMPLANT
NS IRRIG 1000ML POUR BTL (IV SOLUTION) ×2 IMPLANT
PACK GENERAL/GYN (CUSTOM PROCEDURE TRAY) ×2 IMPLANT
PAD ARMBOARD 7.5X6 YLW CONV (MISCELLANEOUS) ×2 IMPLANT
PENCIL SMOKE EVACUATOR (MISCELLANEOUS) ×2 IMPLANT
STAPLER VISISTAT 35W (STAPLE) IMPLANT
SWAB COLLECTION DEVICE MRSA (MISCELLANEOUS) IMPLANT
SWAB CULTURE ESWAB REG 1ML (MISCELLANEOUS) IMPLANT
SYR CONTROL 10ML LL (SYRINGE) IMPLANT
TOWEL GREEN STERILE (TOWEL DISPOSABLE) ×2 IMPLANT
TOWEL GREEN STERILE FF (TOWEL DISPOSABLE) ×2 IMPLANT

## 2022-07-01 NOTE — Interval H&P Note (Signed)
History and Physical Interval Note:  07/01/2022 8:57 AM  [name]  has presented today for surgery, with the diagnosis of LEFT CHEST WALL ABSCESS.  The various methods of treatment have been discussed with the patient and family. After consideration of risks, benefits and other options for treatment, the patient has consented to  Procedure(s): INCISION AND DRAINAGE ABSCESS (Left) as a surgical intervention.  The patient's history has been reviewed, patient examined, no change in status, stable for surgery.  I have reviewed the patient's chart and labs.  Questions were answered to the patient's satisfaction.     [name]   

## 2022-07-01 NOTE — Op Note (Signed)
07/01/2022  10:33 AM  PATIENT:  [name]  36 y.o. male  PRE-OPERATIVE DIAGNOSIS:  LEFT CHEST WALL ABSCESS  POST-OPERATIVE DIAGNOSIS:  LEFT CHEST WALL ABSCESS  PROCEDURE:  Procedure(s): INCISION AND DRAINAGE ABSCESS (Left)  SURGEON:  Surgeon(s) and Role:    * Griselda Miner, MD - Primary  PHYSICIAN ASSISTANT:   ASSISTANTS: none   ANESTHESIA:   general  EBL:  50 mL   BLOOD ADMINISTERED:none  DRAINS: none   LOCAL MEDICATIONS USED:  NONE  SPECIMEN:  Source of Specimen:  left breast tissue  DISPOSITION OF SPECIMEN:  PATHOLOGY  COUNTS:  YES  TOURNIQUET:  * No tourniquets in log *  DICTATION: .Dragon Dictation  After informed consent was obtained the patient was brought to the operating room and placed in the supine position on the operating table.  After adequate induction of general anesthesia the patient's left breast was prepped with Betadine and draped in usual sterile manner.  An appropriate timeout was performed.  The patient had an area of fluctuance laterally on the left breast.  The skin overlying this was opened sharply with the electrocautery.  A large amount of pus was evacuated.  The cavity was then probed with a finger and the incision was opened medially and laterally.  A piece of necrotic skin and subcutaneous tissue laterally was excised sharply with the electrocautery which measured 6 x 8 cm.  There was a large amount of necrotic tissue in the lateral breast and chest wall.  Much of this was excised sharply with the electrocautery.  The abscess cavity extended all the way to the medial edge of the breast and posteriorly to the back.  Hemostasis was achieved using the Bovie electrocautery.  Cultures were obtained.  Once I had debrided much of the dead tissue I then packed the wound with 3 Kerlix's.  Dressings were applied.  The patient tolerated the procedure well.  At the end of the case all needle sponge and instrument counts were correct.  The patient was  then awakened and taken to recovery in stable condition.  PLAN OF CARE: Admit to inpatient   PATIENT DISPOSITION:  PACU - hemodynamically stable.   Delay start of Pharmacological VTE agent (>24hrs) due to surgical blood loss or risk of bleeding: no

## 2022-07-01 NOTE — Transfer of Care (Signed)
Immediate Anesthesia Transfer of Care Note  Patient: [name]  Procedure(s) Performed: INCISION AND DRAINAGE ABSCESS (Left)  Patient Location: PACU  Anesthesia Type:General  Level of Consciousness: awake, alert , and oriented  Airway & Oxygen Therapy: Patient Spontanous Breathing  Post-op Assessment: Report given to RN and Post -op Vital signs reviewed and stable  Post vital signs: Reviewed and stable  Last Vitals:  Vitals Value Taken Time  BP    Temp    Pulse    Resp    SpO2      Last Pain:  Vitals:   07/01/22 0827  TempSrc: Oral  PainSc:       Patients Stated Pain Goal: 0 (07/01/22 0352)  Complications: No notable events documented.

## 2022-07-01 NOTE — Progress Notes (Signed)
Nutrition Follow-up  DOCUMENTATION CODES:   Obesity unspecified  INTERVENTION:  - Continue Ensure Enlive po BID, each supplement provides 350 kcal and 20 grams of protein.  - Add Juven BID.   - Add Magic cup BID with meals, each supplement provides 290 kcal and 9 grams of protein  NUTRITION DIAGNOSIS:   Inadequate oral intake related to decreased appetite, nausea as evidenced by per patient/family report.  GOAL:   Patient will meet greater than or equal to 90% of their needs  MONITOR:   PO intake, Supplement acceptance, Labs, Weight trends, I & O's  REASON FOR ASSESSMENT:   Consult Diet education  ASSESSMENT:   36 year old male with PMHx of asthma who is admitted with left chest wall cellulitis, sepsis, hyperglycemia with newly diagnosed diabetes mellitus, hyponatremia, and also with history of alcohol and tobacco use.  Meds reviewed: folic acid, sliding scale insulin, miralax, thiamine. Labs reviewed: Na low, K low. Blood sugars well controlled.   MD consult for poor PO. Pt is currently NPO and in surgery for incision and drainage for abscess on left chest wall. Pt was evaluated by RD on 12/2. PO intakes were fair at time of assessment. Per record, pt ate 50% of his lunch yesterday. No other intakes documented since 12/2. Pt has Ensure BID ordered. RD will add Juven BID. Will also add Magic Cup BID for now. No significant wt loss per record.   NUTRITION - FOCUSED PHYSICAL EXAM:  Pt in OR.   Diet Order:   Diet Order             Diet NPO time specified Except for: Sips with Meds  Diet effective midnight                   EDUCATION NEEDS:   Education needs have been addressed  Skin:  Skin Assessment: Skin Integrity Issues: Skin Integrity Issues:: Other (Comment) Incisions: N/A Other: Wound to chest; documented as left chest wall celllulitis per MD note  Last BM:  07/01/22  Height:   Ht Readings from Last 1 Encounters:  06/23/22 5\' 9"  (1.753 m)     Weight:   Wt Readings from Last 1 Encounters:  06/28/22 116.8 kg    Ideal Body Weight:  72.7 kg  BMI:  Body mass index is 38.03 kg/m.  Estimated Nutritional Needs:   Kcal:  2200-2400  Protein:  110-120 grams  Fluid:  2.2-2.4 L/day  14/05/23, RD, LDN, CNSC.

## 2022-07-01 NOTE — Anesthesia Procedure Notes (Signed)
Procedure Name: LMA Insertion Date/Time: 07/01/2022 9:40 AM  Performed by: Macie Burows, CRNAPre-anesthesia Checklist: Patient identified, Emergency Drugs available, Suction available, Patient being monitored and Timeout performed Patient Re-evaluated:Patient Re-evaluated prior to induction Oxygen Delivery Method: Circle system utilized Preoxygenation: Pre-oxygenation with 100% oxygen Induction Type: IV induction Ventilation: Mask ventilation without difficulty LMA: LMA inserted LMA Size: 5.0 Placement Confirmation: positive ETCO2 and breath sounds checked- equal and bilateral Tube secured with: Tape Dental Injury: Teeth and Oropharynx as per pre-operative assessment

## 2022-07-01 NOTE — Interval H&P Note (Signed)
History and Physical Interval Note:  07/01/2022 8:57 AM  [name]  has presented today for surgery, with the diagnosis of LEFT CHEST WALL ABSCESS.  The various methods of treatment have been discussed with the patient and family. After consideration of risks, benefits and other options for treatment, the patient has consented to  Procedure(s): INCISION AND DRAINAGE ABSCESS (Left) as a surgical intervention.  The patient's history has been reviewed, patient examined, no change in status, stable for surgery.  I have reviewed the patient's chart and labs.  Questions were answered to the patient's satisfaction.     [name]

## 2022-07-01 NOTE — Progress Notes (Signed)
[facility] PROGRESS NOTE    Progress Note  [name]  WUJ:[id number] DOB: October 23, 1985 DOA: 06/23/2022 PCP: Patient, [name]     Brief Narrative:   [name] is an 36 y.o. malepast medical history boils in his armpit, alcohol use comes in with left wall cellulitis and sepsis CT scan of the chest did not show any abscess was started empirically on IV antibiotics and IV fluids ultrasound showed possible abscess IR was consulted and has been draining wound care and ID has been consulted   Assessment/Plan:   Sepsis due to cellulitis of chest wall/breast abscess CT of the chest showed no evidence of abscess, but ultrasound showed possible abscess. IR for drain aspiration which was done 06/28/2022 culture from aspirate is growing gram-negative rods MRSA PCR was negative. Blood cultures have been negative till date ID was consulted currently on IV cefepime and Flagyl. Surgery was consulted who recommended surgical intervention on 07/01/2022.  Cultures were sent  Diagnosed diabetes mellitus type 2 uncontrolled hyperglycemia: A1c of 11.6. Daily long-acting insulin per sliding scale blood glucose fairly controlled. Resume diet per surgery.  Hypovolemic hyponatremia: Basic metabolic panels pending, KVO IV fluids.  Alcohol abuse: Started on thiamine and folate has been counseled.  Tobacco abuse: Has been counseled nicotine patch.   DVT prophylaxis: lovenox Family Communication:none Status is: Inpatient Remains inpatient appropriate because: Sepsis due to left breast cellulitis    Code Status:     Code Status Orders  (From admission, onward)           Start     Ordered   06/23/22 2002  Full code  Continuous        06/23/22 2006           Code Status History     This patient has a current code status but no historical code status.         IV Access:   Peripheral IV   Procedures and diagnostic studies:   No results found.   Medical  Consultants:   None.   Subjective:    [name] is controlled postsurgical.  Objective:    Vitals:   06/30/22 1737 06/30/22 2010 07/01/22 0012 07/01/22 0352  BP:  111/86 100/69 123/68  Pulse:  (!) 135 86 100  Resp:  20 17 16   Temp: (!) 102.2 F (39 C) 100.3 F (37.9 C) 97.9 F (36.6 C) 97.7 F (36.5 C)  TempSrc: Oral Oral Oral Oral  SpO2:  98% 99% 100%  Weight:      Height:       SpO2: 100 % O2 Flow Rate (L/min): 2 L/min   Intake/Output Summary (Last 24 hours) at 07/01/2022 0814 Last data filed at 07/01/2022 14/02/2022 Gross per 24 hour  Intake 1206.02 ml  Output --  Net 1206.02 ml    Filed Weights   06/23/22 1330 06/28/22 0441  Weight: 111.1 kg 116.8 kg    Exam: General exam: In no acute distress. Respiratory system: Good air movement and clear to auscultation. Cardiovascular system: S1 & S2 heard, RRR. No JVD. Gastrointestinal system: Abdomen is nondistended, soft and nontender.  Extremities: No pedal edema. Psychiatry: Judgement and insight appear normal. Mood & affect appropriate.   Data Reviewed:    Labs: Basic Metabolic Panel: Recent Labs  Lab 06/26/22 0136 06/27/22 0020 06/28/22 0023 06/29/22 0453 06/30/22 0859  NA 132* 131* 133* 129* 133*  K 3.5 3.4* 3.6 3.5 3.3*  CL 104 103 103 100 100  CO2 18* 18* 20* 21* 22  GLUCOSE 199* 202* 232* 191* 131*  BUN 7 8 10 10 9   CREATININE 0.99 0.88 0.80 0.88 0.76  CALCIUM 7.8* 7.8* 8.3* 7.8* 8.2*    GFR Estimated Creatinine Clearance: 157.8 mL/min (by C-G formula based on SCr of 0.76 mg/dL). Liver Function Tests: No results for input(s): "AST", "ALT", "ALKPHOS", "BILITOT", "PROT", "ALBUMIN" in the last 168 hours. No results for input(s): "LIPASE", "AMYLASE" in the last 168 hours. No results for input(s): "AMMONIA" in the last 168 hours. Coagulation profile No results for input(s): "INR", "PROTIME" in the last 168 hours. COVID-19 Labs  No results for input(s): "DDIMER", "FERRITIN", "LDH", "CRP"  in the last 72 hours.  Lab Results  Component Value Date   SARSCOV2NAA NEGATIVE 06/25/2022    CBC: Recent Labs  Lab 06/25/22 0230 06/26/22 0136 06/27/22 0020 06/28/22 0023 06/29/22 0453  WBC 23.1* 17.8* 20.3* 22.7* 23.3*  HGB 13.4 11.6* 11.3* 11.4* 10.4*  HCT 37.2* 32.6* 30.7* 31.4* 28.3*  MCV 77.5* 77.1* 75.2* 75.8* 75.7*  PLT 278 278 318 435* 437*    Cardiac Enzymes: No results for input(s): "CKTOTAL", "CKMB", "CKMBINDEX", "TROPONINI" in the last 168 hours. BNP (last 3 results) No results for input(s): "PROBNP" in the last 8760 hours. CBG: Recent Labs  Lab 06/30/22 1138 06/30/22 1610 06/30/22 2115 07/01/22 0642 07/01/22 0805  GLUCAP 134* 178* 114* 135* 146*    D-Dimer: No results for input(s): "DDIMER" in the last 72 hours. Hgb A1c: No results for input(s): "HGBA1C" in the last 72 hours. Lipid Profile: No results for input(s): "CHOL", "HDL", "LDLCALC", "TRIG", "CHOLHDL", "LDLDIRECT" in the last 72 hours. Thyroid function studies: No results for input(s): "TSH", "T4TOTAL", "T3FREE", "THYROIDAB" in the last 72 hours.  Invalid input(s): "FREET3" Anemia work up: No results for input(s): "VITAMINB12", "FOLATE", "FERRITIN", "TIBC", "IRON", "RETICCTPCT" in the last 72 hours. Sepsis Labs: Recent Labs  Lab 06/25/22 0230 06/26/22 0136 06/27/22 0020 06/28/22 0023 06/29/22 0453  PROCALCITON 1.35  --   --   --   --   WBC 23.1* 17.8* 20.3* 22.7* 23.3*    Microbiology Recent Results (from the past 240 hour(s))  MRSA Next Gen by PCR, Nasal     Status: None   Collection Time: 06/23/22 12:36 AM   Specimen: Nasal Mucosa; Nasal Swab  Result Value Ref Range Status   MRSA by PCR Next Gen NOT DETECTED NOT DETECTED Final    Comment: (NOTE) The GeneXpert MRSA Assay (FDA approved for NASAL specimens only), is one component of a comprehensive MRSA colonization surveillance program. It is not intended to diagnose MRSA infection nor to guide or monitor treatment for MRSA  infections. Test performance is not FDA approved in patients less than 74 years old. Performed at [facility], [address] [place], [place]   Blood culture (routine x 2)     Status: None   Collection Time: 06/23/22  1:40 PM   Specimen: BLOOD RIGHT ARM  Result Value Ref Range Status   Specimen Description BLOOD RIGHT ARM  Final   Special Requests   Final    BOTTLES DRAWN AEROBIC AND ANAEROBIC Blood Culture results may not be optimal due to an excessive volume of blood received in culture bottles   Culture   Final    NO GROWTH 5 DAYS Performed at [facility], [address], [place], [place]    Report Status 06/28/2022 FINAL  Final  Blood culture (routine x 2)  Status: None   Collection Time: 06/23/22  4:10 PM   Specimen: BLOOD  Result Value Ref Range Status   Specimen Description BLOOD SITE NOT SPECIFIED  Final   Special Requests   Final    BOTTLES DRAWN AEROBIC AND ANAEROBIC Blood Culture results may not be optimal due to an excessive volume of blood received in culture bottles   Culture   Final    NO GROWTH 5 DAYS Performed at [facility], [address], [place], [place] [address]    Report Status 06/28/2022 FINAL  Final  SARS Coronavirus 2 by RT PCR (hospital order, performed in [facility] hospital lab) *cepheid single result test* Anterior Nasal Swab     Status: None   Collection Time: 06/25/22  2:26 PM   Specimen: Anterior Nasal Swab  Result Value Ref Range Status   SARS Coronavirus 2 by RT PCR NEGATIVE NEGATIVE Final    Comment: (NOTE) SARS-CoV-2 target nucleic acids are NOT DETECTED.  The SARS-CoV-2 RNA is generally detectable in upper and lower respiratory specimens during the acute phase of infection. The lowest concentration of SARS-CoV-2 viral copies this assay can detect is 250 copies / mL. A negative result does not preclude SARS-CoV-2 infection and should not be used as the sole basis for treatment or  other patient management decisions.  A negative result may occur with improper specimen collection / handling, submission of specimen other than nasopharyngeal swab, presence of viral mutation(s) within the areas targeted by this assay, and inadequate number of viral copies (<250 copies / mL). A negative result must be combined with clinical observations, patient history, and epidemiological information.  Fact Sheet for Patients:   RoadLapTop.co.za  Fact Sheet for Healthcare Providers: http://kim-miller.com/  This test is not yet approved or  cleared by the [place] FDA and has been authorized for detection and/or diagnosis of SARS-CoV-2 by FDA under an Emergency Use Authorization (EUA).  This EUA will remain in effect (meaning this test can be used) for the duration of the COVID-19 declaration under Section 564(b)(1) of the Act, 21 U.S.C. section 360bbb-3(b)(1), unless the authorization is terminated or revoked sooner.  Performed at [facility], [address], [place], [place] [address]   Aerobic/Anaerobic Culture w Gram Stain (surgical/deep wound)     Status: None (Preliminary result)   Collection Time: 06/28/22 11:53 AM   Specimen: Abscess  Result Value Ref Range Status   Specimen Description ABSCESS  Final   Special Requests LEFT BREAST  Final   Gram Stain   Final    ABUNDANT WBC PRESENT,BOTH PMN AND MONONUCLEAR ABUNDANT GRAM NEGATIVE RODS    Culture   Final    CULTURE REINCUBATED FOR BETTER GROWTH Performed at [facility], [address], [place], [place] [address]    Report Status PENDING  Incomplete     Medications:    chlorhexidine       [MAR Hold] enoxaparin (LOVENOX) injection  55 mg Subcutaneous Q24H   [MAR Hold] feeding supplement  237 mL Oral BID WC   [MAR Hold] folic acid  1 mg Oral Daily   [MAR Hold] insulin aspart  0-15 Units Subcutaneous TID WC   [MAR Hold] insulin aspart  0-5 Units  Subcutaneous QHS   [MAR Hold] insulin aspart protamine- aspart  16 Units Subcutaneous BID WC   [MAR Hold] lidocaine (PF)  2 mL Intradermal Once   [MAR Hold] metroNIDAZOLE  500 mg Oral Q12H   [MAR Hold] multivitamin  with minerals  1 tablet Oral Daily   [MAR Hold] nicotine  14 mg Transdermal Daily   [MAR Hold] polyethylene glycol  17 g Oral Daily   potassium chloride  40 mEq Oral BID   [MAR Hold] thiamine  100 mg Oral Daily   Continuous Infusions:  sodium chloride Stopped (06/30/22 2158)   [MAR Hold] ceFEPime (MAXIPIME) IV 2 g (07/01/22 0625)      LOS: 8 days   Marinda ElkAbraham Feliz Ortiz  [facility]  07/01/2022, 8:14 AM

## 2022-07-01 NOTE — Plan of Care (Signed)
  Problem: Clinical Measurements: Goal: Respiratory complications will improve Outcome: Progressing Goal: Cardiovascular complication will be avoided Outcome: Progressing   Problem: Activity: Goal: Risk for activity intolerance will decrease Outcome: Progressing   Problem: Nutrition: Goal: Adequate nutrition will be maintained Outcome: Progressing   Problem: Coping: Goal: Level of anxiety will decrease Outcome: Progressing   Problem: Elimination: Goal: Will not experience complications related to urinary retention Outcome: Progressing   Problem: Pain Managment: Goal: General experience of comfort will improve Outcome: Not Progressing   

## 2022-07-01 NOTE — Plan of Care (Signed)
  Problem: Clinical Measurements: Goal: Respiratory complications will improve Outcome: Progressing Goal: Cardiovascular complication will be avoided Outcome: Progressing   Problem: Activity: Goal: Risk for activity intolerance will decrease Outcome: Progressing   Problem: Coping: Goal: Level of anxiety will decrease Outcome: Not Progressing   Problem: Pain Managment: Goal: General experience of comfort will improve Outcome: Not Progressing

## 2022-07-01 NOTE — Progress Notes (Signed)
Subjective:  Is relieved that he is had surgery that was clearly now with retrospect needed    Antibiotics:  Anti-infectives (From admission, onward)    Start     Dose/Rate Route Frequency Ordered Stop   06/29/22 1200  ceFEPIme (MAXIPIME) 2 g in sodium chloride 0.9 % 100 mL IVPB        2 g 200 mL/hr over 30 Minutes Intravenous Every 8 hours 06/29/22 1005     06/29/22 1100  metroNIDAZOLE (FLAGYL) tablet 500 mg        500 mg Oral Every 12 hours 06/29/22 1005     06/28/22 1200  Ampicillin-Sulbactam (UNASYN) 3 g in sodium chloride 0.9 % 100 mL IVPB  Status:  Discontinued        3 g 200 mL/hr over 30 Minutes Intravenous Every 6 hours 06/28/22 0911 06/29/22 1005   06/24/22 0600  ceFEPIme (MAXIPIME) 2 g in sodium chloride 0.9 % 100 mL IVPB  Status:  Discontinued        2 g 200 mL/hr over 30 Minutes Intravenous Every 8 hours 06/23/22 2041 06/28/22 0911   06/24/22 0300  vancomycin (VANCOREADY) IVPB 1250 mg/250 mL  Status:  Discontinued        1,250 mg 166.7 mL/hr over 90 Minutes Intravenous Every 12 hours 06/23/22 1543 06/29/22 0819   06/23/22 2100  ceFEPIme (MAXIPIME) 2 g in sodium chloride 0.9 % 100 mL IVPB        2 g 200 mL/hr over 30 Minutes Intravenous  Once 06/23/22 2006 06/23/22 2153   06/23/22 2100  metroNIDAZOLE (FLAGYL) IVPB 500 mg  Status:  Discontinued        500 mg 100 mL/hr over 60 Minutes Intravenous Every 12 hours 06/23/22 2006 06/28/22 0849   06/23/22 1600  vancomycin (VANCOREADY) IVPB 1500 mg/300 mL        1,500 mg 150 mL/hr over 120 Minutes Intravenous  Once 06/23/22 1514 06/23/22 1912   06/23/22 1430  vancomycin (VANCOCIN) IVPB 1000 mg/200 mL premix        1,000 mg 200 mL/hr over 60 Minutes Intravenous  Once 06/23/22 1418 06/23/22 1624   06/23/22 1430  cefTRIAXone (ROCEPHIN) 2 g in sodium chloride 0.9 % 100 mL IVPB        2 g 200 mL/hr over 30 Minutes Intravenous  Once 06/23/22 1418 06/23/22 1506       Medications: Scheduled Meds:  enoxaparin  (LOVENOX) injection  55 mg Subcutaneous Q24H   feeding supplement  237 mL Oral BID WC   folic acid  1 mg Oral Daily   insulin aspart  0-15 Units Subcutaneous TID WC   insulin aspart  0-5 Units Subcutaneous QHS   insulin aspart protamine- aspart  16 Units Subcutaneous BID WC   lidocaine (PF)  2 mL Intradermal Once   metroNIDAZOLE  500 mg Oral Q12H   multivitamin with minerals  1 tablet Oral Daily   nicotine  14 mg Transdermal Daily   nutrition supplement (JUVEN)  1 packet Oral BID BM   polyethylene glycol  17 g Oral Daily   thiamine  100 mg Oral Daily   Continuous Infusions:  sodium chloride Stopped (06/30/22 2158)   ceFEPime (MAXIPIME) IV 2 g (07/01/22 0625)   PRN Meds:.acetaminophen **OR** [DISCONTINUED] acetaminophen, ALPRAZolam, HYDROmorphone (DILAUDID) injection, ibuprofen, metoprolol tartrate, morphine injection, ondansetron **OR** ondansetron (ZOFRAN) IV, mouth rinse, oxyCODONE    Objective: Weight change:   Intake/Output Summary (Last 24 hours) at 07/01/2022  [place] filed at 07/01/2022 1026 Gross per 24 hour  Intake 1086.02 ml  Output 50 ml  Net 1036.02 ml    Blood pressure (!) 182/97, pulse (!) 113, temperature 99.6 F (37.6 C), temperature source Oral, resp. rate 20, height 5\' 9"  (1.753 m), weight 116.8 kg, SpO2 94 %. Temp:  [97.7 F (36.5 C)-102.2 F (39 C)] 99.6 F (37.6 C) (12/08 1107) Pulse Rate:  [86-135] 113 (12/08 1107) Resp:  [15-26] 20 (12/08 1107) BP: (100-182)/(68-103) 182/97 (12/08 1107) SpO2:  [90 %-100 %] 94 % (12/08 1107)  Physical Exam: Physical Exam Constitutional:      Appearance: He is well-developed.  HENT:     Head: Normocephalic and atraumatic.  Eyes:     Conjunctiva/sclera: Conjunctivae normal.  Cardiovascular:     Rate and Rhythm: Normal rate and regular rhythm.  Pulmonary:     Effort: Pulmonary effort is normal. No respiratory distress.     Breath sounds: No wheezing.  Abdominal:     General: There is no distension.      Palpations: Abdomen is soft.  Musculoskeletal:        General: Normal range of motion.     Cervical back: Normal range of motion and neck supple.  Skin:    General: Skin is warm and dry.  Neurological:     General: No focal deficit present.     Mental Status: He is alert and oriented to person, place, and time.  Psychiatric:        Mood and Affect: Mood normal.        Behavior: Behavior normal.        Thought Content: Thought content normal.        Judgment: Judgment normal.   Chest bandaged CBC:    BMET Recent Labs    06/30/22 0859 07/01/22 1153  NA 133* 133*  K 3.3* 4.1  CL 100 101  CO2 22 22  GLUCOSE 131* 134*  BUN 9 7  CREATININE 0.76 0.84  CALCIUM 8.2* 8.1*      Liver Panel  No results for input(s): "PROT", "ALBUMIN", "AST", "ALT", "ALKPHOS", "BILITOT", "BILIDIR", "IBILI" in the last 72 hours.     Sedimentation Rate No results for input(s): "ESRSEDRATE" in the last 72 hours. C-Reactive Protein No results for input(s): "CRP" in the last 72 hours.  Micro Results: Recent Results (from the past 720 hour(s))  MRSA Next Gen by PCR, Nasal     Status: None   Collection Time: 06/23/22 12:36 AM   Specimen: Nasal Mucosa; Nasal Swab  Result Value Ref Range Status   MRSA by PCR Next Gen NOT DETECTED NOT DETECTED Final    Comment: (NOTE) The GeneXpert MRSA Assay (FDA approved for NASAL specimens only), is one component of a comprehensive MRSA colonization surveillance program. It is not intended to diagnose MRSA infection nor to guide or monitor treatment for MRSA infections. Test performance is not FDA approved in patients less than 77 years old. Performed at [facility], [place] [address] [place], [place] [address]   Blood culture (routine x 2)     Status: None   Collection Time: 06/23/22  1:40 PM   Specimen: BLOOD RIGHT ARM  Result Value Ref Range Status   Specimen Description BLOOD RIGHT ARM  Final   Special Requests   Final    BOTTLES DRAWN  AEROBIC AND ANAEROBIC Blood Culture results may not be optimal due to an excessive volume of blood received in culture bottles  Culture   Final    NO GROWTH 5 DAYS Performed at [facility], [place] [address] [place], [place] [address]    Report Status 06/28/2022 FINAL  Final  Blood culture (routine x 2)     Status: None   Collection Time: 06/23/22  4:10 PM   Specimen: BLOOD  Result Value Ref Range Status   Specimen Description BLOOD SITE NOT SPECIFIED  Final   Special Requests   Final    BOTTLES DRAWN AEROBIC AND ANAEROBIC Blood Culture results may not be optimal due to an excessive volume of blood received in culture bottles   Culture   Final    NO GROWTH 5 DAYS Performed at [facility], [place] [address] [place], [place] [address]    Report Status 06/28/2022 FINAL  Final  SARS Coronavirus 2 by RT PCR (hospital order, performed in [facility] hospital lab) *cepheid single result test* Anterior Nasal Swab     Status: None   Collection Time: 06/25/22  2:26 PM   Specimen: Anterior Nasal Swab  Result Value Ref Range Status   SARS Coronavirus 2 by RT PCR NEGATIVE NEGATIVE Final    Comment: (NOTE) SARS-CoV-2 target nucleic acids are NOT DETECTED.  The SARS-CoV-2 RNA is generally detectable in upper and lower respiratory specimens during the acute phase of infection. The lowest concentration of SARS-CoV-2 viral copies this assay can detect is 250 copies / mL. A negative result does not preclude SARS-CoV-2 infection and should not be used as the sole basis for treatment or other patient management decisions.  A negative result may occur with improper specimen collection / handling, submission of specimen other than nasopharyngeal swab, presence of viral mutation(s) within the areas targeted by this assay, and inadequate number of viral copies (<250 copies / mL). A negative result must be combined with clinical observations, patient history, and epidemiological  information.  Fact Sheet for Patients:   https://www.patel.info/  Fact Sheet for Healthcare Providers: https://hall.com/  This test is not yet approved or  cleared by the [place] FDA and has been authorized for detection and/or diagnosis of SARS-CoV-2 by FDA under an Emergency Use Authorization (EUA).  This EUA will remain in effect (meaning this test can be used) for the duration of the COVID-19 declaration under Section 564(b)(1) of the Act, 21 U.S.C. section 360bbb-3(b)(1), unless the authorization is terminated or revoked sooner.  Performed at [facility], [place] [address] [place], [place] [address]   Aerobic/Anaerobic Culture w Gram Stain (surgical/deep wound)     Status: None (Preliminary result)   Collection Time: 06/28/22 11:53 AM   Specimen: Abscess  Result Value Ref Range Status   Specimen Description ABSCESS  Final   Special Requests LEFT BREAST  Final   Gram Stain   Final    ABUNDANT WBC PRESENT,BOTH PMN AND MONONUCLEAR ABUNDANT GRAM NEGATIVE RODS    Culture   Final    CULTURE REINCUBATED FOR BETTER GROWTH Performed at [facility], [place] [address] [place], [place] [address]    Report Status PENDING  Incomplete    Studies/Results: No results found.    Assessment/Plan:  INTERVAL HISTORY:  pt is sp surgery  Principal Problem:   Cellulitis of chest wall Active Problems:   Hyperglycemia   Hyponatremia   Tobacco abuse   Alcohol abuse   Breast abscess   Hidradenitis suppurativa   Dental infection   Morbid obesity ([place])   Type 2 diabetes mellitus with hyperglycemia (  HCC)    [name] is a 36 y.o. male with morbid obesity newly diagnosed noticed diabetes mellitus likely retinitis suppurativa with recurrent infections in his soft tissues in particular axilla groin now admitted with eft soft tissue breast infection   #1  Left small abscess  The patient has now been taken to the operating  room by Dr Carolynne Edouard.Large amount of pus was encountered and irrigated there was also large amount necrotic tissue in the breast and chest wall which was excised and removed.  Cultures were taken  Recent cultures had shown gram-negative rods but organisms had not been been a fight and cultures have been reintubated there are new cultures of there were taken yesterday in the interim we will continue cefepime and metronidazole   2.  Retinitis suppurativa: He will need connected to dermatology.  3.  Diabetes mellitus: Would benefit from ozempic or simliar drug I would think .  I spent 52 minutes with the patient including than 50% of the time in face to face counseling of the patient guarding his necrotic abscessed chest wall infection,along with review of medical records in preparation for the visit and during the visit and in coordination of his care.  I will follow-up on his culture data this weekend and adjust antibiotics accordingly.       LOS: 8 days   Acey Lav 07/01/2022, 1:53 PM

## 2022-07-02 ENCOUNTER — Inpatient Hospital Stay (HOSPITAL_COMMUNITY): Payer: No Typology Code available for payment source

## 2022-07-02 DIAGNOSIS — F419 Anxiety disorder, unspecified: Secondary | ICD-10-CM

## 2022-07-02 DIAGNOSIS — E1165 Type 2 diabetes mellitus with hyperglycemia: Secondary | ICD-10-CM | POA: Diagnosis not present

## 2022-07-02 DIAGNOSIS — M60009 Infective myositis, unspecified site: Secondary | ICD-10-CM

## 2022-07-02 DIAGNOSIS — N611 Abscess of the breast and nipple: Secondary | ICD-10-CM | POA: Diagnosis not present

## 2022-07-02 DIAGNOSIS — L03313 Cellulitis of chest wall: Secondary | ICD-10-CM | POA: Diagnosis not present

## 2022-07-02 DIAGNOSIS — L732 Hidradenitis suppurativa: Secondary | ICD-10-CM

## 2022-07-02 LAB — GLUCOSE, CAPILLARY
Glucose-Capillary: 196 mg/dL — ABNORMAL HIGH (ref 70–99)
Glucose-Capillary: 177 mg/dL — ABNORMAL HIGH (ref 70–99)
Glucose-Capillary: 152 mg/dL — ABNORMAL HIGH (ref 70–99)
Glucose-Capillary: 157 mg/dL — ABNORMAL HIGH (ref 70–99)

## 2022-07-02 LAB — CBC WITH DIFFERENTIAL/PLATELET
Abs Immature Granulocytes: 1.37 10*3/uL — ABNORMAL HIGH (ref 0.00–0.07)
Basophils Absolute: 0.1 10*3/uL (ref 0.0–0.1)
Basophils Relative: 0 %
Eosinophils Absolute: 0.1 10*3/uL (ref 0.0–0.5)
Eosinophils Relative: 0 %
HCT: 32.2 % — ABNORMAL LOW (ref 39.0–52.0)
Hemoglobin: 11.4 g/dL — ABNORMAL LOW (ref 13.0–17.0)
Immature Granulocytes: 4 %
Lymphocytes Relative: 6 %
Lymphs Abs: 2 10*3/uL (ref 0.7–4.0)
MCH: 27.3 pg (ref 26.0–34.0)
MCHC: 35.4 g/dL (ref 30.0–36.0)
MCV: 77 fL — ABNORMAL LOW (ref 80.0–100.0)
Monocytes Absolute: 0.7 10*3/uL (ref 0.1–1.0)
Monocytes Relative: 2 %
Neutro Abs: 27.8 10*3/uL — ABNORMAL HIGH (ref 1.7–7.7)
Neutrophils Relative %: 88 %
Platelets: 641 10*3/uL — ABNORMAL HIGH (ref 150–400)
RBC: 4.18 MIL/uL — ABNORMAL LOW (ref 4.22–5.81)
RDW: 13.2 % (ref 11.5–15.5)
WBC: 32.1 10*3/uL — ABNORMAL HIGH (ref 4.0–10.5)
nRBC: 0 % (ref 0.0–0.2)

## 2022-07-02 LAB — BASIC METABOLIC PANEL
Anion gap: 13 (ref 5–15)
BUN: 10 mg/dL (ref 6–20)
CO2: 23 mmol/L (ref 22–32)
Calcium: 8.5 mg/dL — ABNORMAL LOW (ref 8.9–10.3)
Chloride: 97 mmol/L — ABNORMAL LOW (ref 98–111)
Creatinine, Ser: 0.69 mg/dL (ref 0.61–1.24)
GFR, Estimated: >60 mL/min (ref >60)
Glucose, Bld: 197 mg/dL — ABNORMAL HIGH (ref 70–99)
Potassium: 3.6 mmol/L (ref 3.5–5.1)
Sodium: 133 mmol/L — ABNORMAL LOW (ref 135–145)

## 2022-07-02 MED ORDER — VANCOMYCIN HCL 1250 MG/250ML IV SOLN
1250.0000 mg | Freq: Two times a day (BID) | INTRAVENOUS | Status: DC
  Administered 2022-07-03 (×2): 1250 mg via INTRAVENOUS
  Filled 2022-07-02 (×2): qty 250

## 2022-07-02 MED ORDER — FUROSEMIDE 10 MG/ML IJ SOLN
40.0000 mg | Freq: Once | INTRAMUSCULAR | Status: AC
  Administered 2022-07-02: 40 mg via INTRAVENOUS
  Filled 2022-07-02: qty 4

## 2022-07-02 MED ORDER — CLONAZEPAM 0.5 MG PO TABS
0.5000 mg | ORAL_TABLET | Freq: Two times a day (BID) | ORAL | Status: DC | PRN
  Administered 2022-07-02 – 2022-07-12 (×6): 0.5 mg via ORAL
  Filled 2022-07-02 (×6): qty 1

## 2022-07-02 MED ORDER — VANCOMYCIN HCL 2000 MG/400ML IV SOLN
2000.0000 mg | Freq: Once | INTRAVENOUS | Status: AC
  Administered 2022-07-02: 2000 mg via INTRAVENOUS
  Filled 2022-07-02: qty 400

## 2022-07-02 NOTE — Plan of Care (Signed)
  Problem: Education: Goal: Ability to describe self-care measures that may prevent or decrease complications (Diabetes Survival Skills Education) will improve Outcome: Progressing Goal: Individualized Educational Video(s) Outcome: Progressing   Problem: Coping: Goal: Ability to adjust to condition or change in health will improve Outcome: Progressing   Problem: Fluid Volume: Goal: Ability to maintain a balanced intake and output will improve Outcome: Progressing   Problem: Health Behavior/Discharge Planning: Goal: Ability to identify and utilize available resources and services will improve Outcome: Progressing Goal: Ability to manage health-related needs will improve Outcome: Progressing   Problem: Metabolic: Goal: Ability to maintain appropriate glucose levels will improve Outcome: Progressing   Problem: Nutritional: Goal: Maintenance of adequate nutrition will improve Outcome: Progressing Goal: Progress toward achieving an optimal weight will improve Outcome: Progressing   Problem: Skin Integrity: Goal: Risk for impaired skin integrity will decrease Outcome: Progressing   Problem: Tissue Perfusion: Goal: Adequacy of tissue perfusion will improve Outcome: Progressing   Problem: Clinical Measurements: Goal: Ability to avoid or minimize complications of infection will improve Outcome: Progressing   Problem: Skin Integrity: Goal: Skin integrity will improve Outcome: Progressing   Problem: Education: Goal: Knowledge of General Education information will improve Description: Including pain rating scale, medication(s)/side effects and non-pharmacologic comfort measures Outcome: Progressing   Problem: Health Behavior/Discharge Planning: Goal: Ability to manage health-related needs will improve Outcome: Progressing   Problem: Clinical Measurements: Goal: Ability to maintain clinical measurements within normal limits will improve Outcome: Progressing Goal: Will  remain free from infection Outcome: Progressing Goal: Diagnostic test results will improve Outcome: Progressing Goal: Respiratory complications will improve Outcome: Progressing Goal: Cardiovascular complication will be avoided Outcome: Progressing   Problem: Activity: Goal: Risk for activity intolerance will decrease Outcome: Progressing   Problem: Nutrition: Goal: Adequate nutrition will be maintained Outcome: Progressing   Problem: Coping: Goal: Level of anxiety will decrease Outcome: Progressing   Problem: Elimination: Goal: Will not experience complications related to bowel motility Outcome: Progressing Goal: Will not experience complications related to urinary retention Outcome: Progressing   Problem: Pain Managment: Goal: General experience of comfort will improve Outcome: Progressing   Problem: Safety: Goal: Ability to remain free from injury will improve Outcome: Progressing   Problem: Skin Integrity: Goal: Risk for impaired skin integrity will decrease Outcome: Progressing   

## 2022-07-02 NOTE — Progress Notes (Signed)
PROGRESS NOTE  [name] FGH:[id number] DOB: 1985/12/22 DOA: 06/23/2022 PCP: Patient, [name]   LOS: 9 days   Brief Narrative / Interim history: 36 year old male with history of obesity, EtOH use comes into the hospital with left chest wall cellulitis and sepsis.  He reports initially there was a boil in the area that popped open, and following that the whole area started to get worse.  Subjective / 24h Interval events: Complains of significant swelling in his legs and arms.  Has moderate pain at the surgical area  Assesement and Plan: Principal Problem:   Cellulitis of chest wall Active Problems:   Hyperglycemia   Hyponatremia   Tobacco abuse   Alcohol abuse   Breast abscess   Hidradenitis suppurativa   Dental infection   Morbid obesity (HCC)   Type 2 diabetes mellitus with hyperglycemia (HCC)  Principal problem Sepsis due to chest wall cellulitis /abscess -ultrasound of the chest showed possible abscess.  IR was initially consulted and he is status post aspiration on 12/5.  Given persistent symptoms, general surgery was consulted and eventually was taken to the OR on 12/8 for I&D.  He is n.p.o. this morning and per patient he will go back to the OR today. -ID consulted, appreciate input.  Currently on cefepime and metronidazole -Microbiology reviewed.  MRSA nasal PCR negative.  Blood cultures negative at 5 days, final.  IR aspirate 12/5 showing gram-negative rods.  Surgical cultures 12/8 showing few gram-positive cocci, gram-positive rods.  Continue to closely monitor -Still febrile to 100.4, white count jumped to 32.1  Active problems DM2, poorly controlled, with hyperglycemia -new diagnosis.  Currently on 7030 mix as well as sliding scale.  CBGs with fair control on this regimen.  Consult diabetes coordinator  Lab Results  Component Value Date   HGBA1C 11.6 (H) 06/24/2022   CBG (last 3)  Recent Labs    07/01/22 1543 07/01/22 2121 07/02/22 0608  GLUCAP 122* 183*  196*   Hyponatremia-looks significantly fluid overloaded.  He is up 10 L.  Will diurese once back from the OR this afternoon if blood pressure is stable  Alcohol and tobacco use-recommend cessation  Scheduled Meds:  enoxaparin (LOVENOX) injection  55 mg Subcutaneous Q24H   feeding supplement  237 mL Oral BID WC   folic acid  1 mg Oral Daily   insulin aspart  0-15 Units Subcutaneous TID WC   insulin aspart  0-5 Units Subcutaneous QHS   insulin aspart protamine- aspart  16 Units Subcutaneous BID WC   metroNIDAZOLE  500 mg Oral Q12H   multivitamin with minerals  1 tablet Oral Daily   nicotine  14 mg Transdermal Daily   nutrition supplement (JUVEN)  1 packet Oral BID BM   polyethylene glycol  17 g Oral Daily   thiamine  100 mg Oral Daily   Continuous Infusions:  ceFEPime (MAXIPIME) IV 2 g (07/02/22 0645)   PRN Meds:.acetaminophen **OR** [DISCONTINUED] acetaminophen, ALPRAZolam, HYDROmorphone (DILAUDID) injection, ibuprofen, metoprolol tartrate, morphine injection, ondansetron **OR** ondansetron (ZOFRAN) IV, mouth rinse, oxyCODONE  Current Outpatient Medications  Medication Instructions   ibuprofen (ADVIL) 200 mg, Oral, Every 6 hours PRN   naproxen (NAPROSYN) 500 mg, Oral, 2 times daily    Diet Orders (From admission, onward)     Start     Ordered   07/02/22 0001  Diet NPO time specified Except for: Sips with Meds  Diet effective midnight       Question:  Except for  Answer:  Sips with Meds   07/01/22 1113            DVT prophylaxis: Enoxaparin   Lab Results  Component Value Date   PLT 641 (H) 07/02/2022      Code Status: Full Code  Family Communication: No family at bedside  Status is: Inpatient  Remains inpatient appropriate because: Severity of illness  Level of care: Progressive  Consultants:  ID General surgery IR  Objective: Vitals:   07/01/22 2327 07/02/22 0341 07/02/22 0627 07/02/22 0740  BP: 115/62 124/78  132/72  Pulse: (!) 121 (!) 112     Resp: 19 19  14   Temp: (!) 100.4 F (38 C) 99.1 F (37.3 C)  100.3 F (37.9 C)  TempSrc: Oral Oral  Oral  SpO2: 100% 92%  92%  Weight:   116.9 kg   Height:        Intake/Output Summary (Last 24 hours) at 07/02/2022 0950 Last data filed at 07/01/2022 1948 Gross per 24 hour  Intake 440 ml  Output 50 ml  Net 390 ml   Wt Readings from Last 3 Encounters:  07/02/22 116.9 kg  09/16/16 124.7 kg  08/06/16 120.2 kg    Examination:  Constitutional: NAD Eyes: no scleral icterus ENMT: Mucous membranes are moist.  Neck: normal, supple Respiratory: clear to auscultation bilaterally, no wheezing, no crackles. Normal respiratory effort. No accessory muscle use.  Cardiovascular: Regular rate and rhythm, no murmurs / rubs / gallops.  2+ pitting edema Abdomen: non distended, no tenderness. Bowel sounds positive.  Musculoskeletal: no clubbing / cyanosis.   Data Reviewed: I have independently reviewed following labs and imaging studies   CBC Recent Labs  Lab 06/26/22 0136 06/27/22 0020 06/28/22 0023 06/29/22 0453 07/02/22 0652  WBC 17.8* 20.3* 22.7* 23.3* 32.1*  HGB 11.6* 11.3* 11.4* 10.4* 11.4*  HCT 32.6* 30.7* 31.4* 28.3* 32.2*  PLT 278 318 435* 437* 641*  MCV 77.1* 75.2* 75.8* 75.7* 77.0*  MCH 27.4 27.7 27.5 27.8 27.3  MCHC 35.6 36.8* 36.3* 36.7* 35.4  RDW 12.8 13.1 13.2 13.1 13.2  LYMPHSABS  --   --   --   --  2.0  MONOABS  --   --   --   --  0.7  EOSABS  --   --   --   --  0.1  BASOSABS  --   --   --   --  0.1    Recent Labs  Lab 06/28/22 0023 06/29/22 0453 06/30/22 0859 07/01/22 1153 07/02/22 0652  NA 133* 129* 133* 133* 133*  K 3.6 3.5 3.3* 4.1 3.6  CL 103 100 100 101 97*  CO2 20* 21* 22 22 23   GLUCOSE 232* 191* 131* 134* 197*  BUN 10 10 9 7 10   CREATININE 0.80 0.88 0.76 0.84 0.69  CALCIUM 8.3* 7.8* 8.2* 8.1* 8.5*    ------------------------------------------------------------------------------------------------------------------ No results for input(s):  "CHOL", "HDL", "LDLCALC", "TRIG", "CHOLHDL", "LDLDIRECT" in the last 72 hours.  Lab Results  Component Value Date   HGBA1C 11.6 (H) 06/24/2022   ------------------------------------------------------------------------------------------------------------------ No results for input(s): "TSH", "T4TOTAL", "T3FREE", "THYROIDAB" in the last 72 hours.  Invalid input(s): "FREET3"  Cardiac Enzymes No results for input(s): "CKMB", "TROPONINI", "MYOGLOBIN" in the last 168 hours.  Invalid input(s): "CK" ------------------------------------------------------------------------------------------------------------------ No results found for: "BNP"  CBG: Recent Labs  Lab 07/01/22 0805 07/01/22 1054 07/01/22 1543 07/01/22 2121 07/02/22 0608  GLUCAP 146* 149* 122* 183* 196*    Recent Results (from the past 240 hour(s))  MRSA Next Gen by PCR, Nasal     Status: None   Collection Time: 06/23/22 12:36 AM   Specimen: Nasal Mucosa; Nasal Swab  Result Value Ref Range Status   MRSA by PCR Next Gen NOT DETECTED NOT DETECTED Final    Comment: (NOTE) The GeneXpert MRSA Assay (FDA approved for NASAL specimens only), is one component of a comprehensive MRSA colonization surveillance program. It is not intended to diagnose MRSA infection nor to guide or monitor treatment for MRSA infections. Test performance is not FDA approved in patients less than 68 years old. Performed at [facility], [place] [address] [place], [place] [address]   Blood culture (routine x 2)     Status: None   Collection Time: 06/23/22  1:40 PM   Specimen: BLOOD RIGHT ARM  Result Value Ref Range Status   Specimen Description BLOOD RIGHT ARM  Final   Special Requests   Final    BOTTLES DRAWN AEROBIC AND ANAEROBIC Blood Culture results may not be optimal due to an excessive volume of blood received in culture bottles   Culture   Final    NO GROWTH 5 DAYS Performed at [facility], [place] [address], [place], [place]  [address]    Report Status 06/28/2022 FINAL  Final  Blood culture (routine x 2)     Status: None   Collection Time: 06/23/22  4:10 PM   Specimen: BLOOD  Result Value Ref Range Status   Specimen Description BLOOD SITE NOT SPECIFIED  Final   Special Requests   Final    BOTTLES DRAWN AEROBIC AND ANAEROBIC Blood Culture results may not be optimal due to an excessive volume of blood received in culture bottles   Culture   Final    NO GROWTH 5 DAYS Performed at [facility], [place] [address], [place], [place] [address]    Report Status 06/28/2022 FINAL  Final  SARS Coronavirus 2 by RT PCR (hospital order, performed in [facility] hospital lab) *cepheid single result test* Anterior Nasal Swab     Status: None   Collection Time: 06/25/22  2:26 PM   Specimen: Anterior Nasal Swab  Result Value Ref Range Status   SARS Coronavirus 2 by RT PCR NEGATIVE NEGATIVE Final    Comment: (NOTE) SARS-CoV-2 target nucleic acids are NOT DETECTED.  The SARS-CoV-2 RNA is generally detectable in upper and lower respiratory specimens during the acute phase of infection. The lowest concentration of SARS-CoV-2 viral copies this assay can detect is 250 copies / mL. A negative result does not preclude SARS-CoV-2 infection and should not be used as the sole basis for treatment or other patient management decisions.  A negative result may occur with improper specimen collection / handling, submission of specimen other than nasopharyngeal swab, presence of viral mutation(s) within the areas targeted by this assay, and inadequate number of viral copies (<250 copies / mL). A negative result must be combined with clinical observations, patient history, and epidemiological information.  Fact Sheet for Patients:   https://www.patel.info/  Fact Sheet for Healthcare Providers: https://hall.com/  This test is not yet approved or  cleared by the [place] FDA and has been  authorized for detection and/or diagnosis of SARS-CoV-2 by FDA under an Emergency Use Authorization (EUA).  This EUA will remain in effect (meaning this test can be used) for the duration of the COVID-19 declaration under Section 564(b)(1) of the Act, 21 U.S.C. section 360bbb-3(b)(1), unless the authorization is terminated or  revoked sooner.  Performed at [facility], [place] [address], [place], [place] [address]   Aerobic/Anaerobic Culture w Gram Stain (surgical/deep wound)     Status: None (Preliminary result)   Collection Time: 06/28/22 11:53 AM   Specimen: Abscess  Result Value Ref Range Status   Specimen Description ABSCESS  Final   Special Requests LEFT BREAST  Final   Gram Stain   Final    ABUNDANT WBC PRESENT,BOTH PMN AND MONONUCLEAR ABUNDANT GRAM NEGATIVE RODS    Culture   Final    HOLDING FOR POSSIBLE ANAEROBE Performed at [facility], [address] [place], [place] [address]    Report Status PENDING  Incomplete  Aerobic/Anaerobic Culture w Gram Stain (surgical/deep wound)     Status: None (Preliminary result)   Collection Time: 07/01/22  9:48 AM   Specimen: PATH Breast other; Tissue  Result Value Ref Range Status   Specimen Description WOUND  Final   Special Requests ABSCESS LEFT BREAST  Final   Gram Stain   Final    FEW GRAM POSITIVE COCCI RARE GRAM POSITIVE RODS RARE WBC PRESENT, PREDOMINANTLY PMN Performed at [facility], [place] [address], [place],  [address]    Culture PENDING  Incomplete   Report Status PENDING  Incomplete     Radiology Studies: No results found.   Marzetta Board, MD, PhD [facility]  Between 7 am - 7 pm I am available, please contact me via Amion (for emergencies) or Securechat (non urgent messages)  Between 7 pm - 7 am I am not available, please contact night coverage MD/APP via Amion

## 2022-07-02 NOTE — Progress Notes (Addendum)
Pharmacy Antibiotic Note  [name] is a 36 y.o. male admitted on 06/23/2022 with cellulitis.  Pharmacy has been consulted for vancomycin dosing.  S/p I&D of chest wall abscess. WBC up to 32, Scr 0.6 (CrCl >100 mL/min). Tmax 102.4. ID following - L breast abscess cx pending. Last dose of vanc 12/6 - levels were ordered on 12/5-12/6 with a calcAUC of 457 (Scr ~0.88) on vanc 1250 mg IV every 12 hours.  Plan: Vancomycin 2g IV once then 1250 mg IV every 12 hours Monitor clinical pic, cx results, vanc levels as appropriate Monitor ID recommendations  Height: 5\' 9"  (175.3 cm) Weight: 116.9 kg (257 lb 11.5 oz) IBW/kg (Calculated) : 70.7  Temp (24hrs), Avg:100.1 F (37.8 C), Min:98.9 F (37.2 C), Max:102.4 F (39.1 C)  Recent Labs  Lab 06/26/22 0136 06/27/22 0020 06/28/22 0023 06/28/22 2039 06/29/22 0453 06/30/22 0859 07/01/22 1153 07/02/22 0652  WBC 17.8* 20.3* 22.7*  --  23.3*  --   --  32.1*  CREATININE 0.99 0.88 0.80  --  0.88 0.76 0.84 0.69  VANCOTROUGH  --   --   --   --  10*  --   --   --   VANCOPEAK  --   --   --  27*  --   --   --   --     Estimated Creatinine Clearance: 158 mL/min (by C-G formula based on SCr of 0.69 mg/dL).    Allergies  Allergen Reactions   Peach [Prunus Persica]     Pt reports swelling and lip tingling as reaction    Antimicrobials this admission: Cefepime 11/30 >> Vancomycin 11/30 >> 12/6, 12/9 >> Metronidazole 11/30 >> 12/4, 12/6 >> Unasyn 12/5 >> 12/6 Ceftriaxone 11/30 x1  Dose adjustments this admission: N/A  Microbiology results: 11/30 BCx: ngtd 12/8 L breast abscess: pending  11/30 MRSA PCR: neg  Thank you for allowing pharmacy to be a part of this patient's care.  12/30, PharmD, BCCCP Clinical Pharmacist  Phone: [phone] 07/02/2022 11:23 AM  Please check AMION for all [facility] phone numbers After 10:00 PM, call Main Pharmacy [phone]

## 2022-07-02 NOTE — Progress Notes (Addendum)
1 Day Post-Op  Subjective: CC: Pain over left chest wall. About to do first dressing change with RN.   Febrile to 102.4 overnight. Tachycardic this morning in the 110's. No hypotension. WBC up at 32.1 from 233 on 12/6. Cx's still pending, gram stain reviewed - ID following for abx selection.   Objective: Vital signs in last 24 hours: Temp:  [98.9 F (37.2 C)-102.4 F (39.1 C)] 100.3 F (37.9 C) (12/09 0740) Pulse Rate:  [102-122] 112 (12/09 0341) Resp:  [13-26] 14 (12/09 0740) BP: (106-182)/(59-97) 132/72 (12/09 0740) SpO2:  [90 %-100 %] 92 % (12/09 0740) Weight:  [116.9 kg] 116.9 kg (12/09 0627) Last BM Date : 06/28/22  Intake/Output from previous day: 12/08 0701 - 12/09 0700 In: 440 [P.O.:440] Out: 50 [Blood:50] Intake/Output this shift: No intake/output data recorded.  PE: Gen:  Alert,NAD, pleasant Wound: As below. 13x8x5cm with some granulation tissue at the base but large amount of necrotic fibrinous tissue as well. Tracts 6cm medially, laterally and superiorly. No tracking inferiorly. No loculations broken. No active drainage. There is significant induration, erythema and heat along the lower left pec, extending to the left posterior chest wall with some more faint erythema and induration extending down the left flank into the lateral left abdomen.         Lab Results:  Recent Labs    07/02/22 0652  WBC 32.1*  HGB 11.4*  HCT 32.2*  PLT 641*   BMET Recent Labs    07/01/22 1153 07/02/22 0652  NA 133* 133*  K 4.1 3.6  CL 101 97*  CO2 22 23  GLUCOSE 134* 197*  BUN 7 10  CREATININE 0.84 0.69  CALCIUM 8.1* 8.5*   PT/INR No results for input(s): "LABPROT", "INR" in the last 72 hours. CMP     Component Value Date/Time   NA 133 (L) 07/02/2022 0652   K 3.6 07/02/2022 0652   CL 97 (L) 07/02/2022 0652   CO2 23 07/02/2022 0652   GLUCOSE 197 (H) 07/02/2022 0652   BUN 10 07/02/2022 0652   CREATININE 0.69 07/02/2022 0652   CALCIUM 8.5 (L) 07/02/2022  0652   GFRNONAA >60 07/02/2022 2025   Lipase  No results found for: "LIPASE"  Studies/Results: No results found.  Anti-infectives: Anti-infectives (From admission, onward)    Start     Dose/Rate Route Frequency Ordered Stop   06/29/22 1200  ceFEPIme (MAXIPIME) 2 g in sodium chloride 0.9 % 100 mL IVPB        2 g 200 mL/hr over 30 Minutes Intravenous Every 8 hours 06/29/22 1005     06/29/22 1100  metroNIDAZOLE (FLAGYL) tablet 500 mg        500 mg Oral Every 12 hours 06/29/22 1005     06/28/22 1200  Ampicillin-Sulbactam (UNASYN) 3 g in sodium chloride 0.9 % 100 mL IVPB  Status:  Discontinued        3 g 200 mL/hr over 30 Minutes Intravenous Every 6 hours 06/28/22 0911 06/29/22 1005   06/24/22 0600  ceFEPIme (MAXIPIME) 2 g in sodium chloride 0.9 % 100 mL IVPB  Status:  Discontinued        2 g 200 mL/hr over 30 Minutes Intravenous Every 8 hours 06/23/22 2041 06/28/22 0911   06/24/22 0300  vancomycin (VANCOREADY) IVPB 1250 mg/250 mL  Status:  Discontinued        1,250 mg 166.7 mL/hr over 90 Minutes Intravenous Every 12 hours 06/23/22 1543 06/29/22 0819   06/23/22 2100  ceFEPIme (MAXIPIME) 2 g in sodium chloride 0.9 % 100 mL IVPB        2 g 200 mL/hr over 30 Minutes Intravenous  Once 06/23/22 2006 06/23/22 2153   06/23/22 2100  metroNIDAZOLE (FLAGYL) IVPB 500 mg  Status:  Discontinued        500 mg 100 mL/hr over 60 Minutes Intravenous Every 12 hours 06/23/22 2006 06/28/22 0849   06/23/22 1600  vancomycin (VANCOREADY) IVPB 1500 mg/300 mL        1,500 mg 150 mL/hr over 120 Minutes Intravenous  Once 06/23/22 1514 06/23/22 1912   06/23/22 1430  vancomycin (VANCOCIN) IVPB 1000 mg/200 mL premix        1,000 mg 200 mL/hr over 60 Minutes Intravenous  Once 06/23/22 1418 06/23/22 1624   06/23/22 1430  cefTRIAXone (ROCEPHIN) 2 g in sodium chloride 0.9 % 100 mL IVPB        2 g 200 mL/hr over 30 Minutes Intravenous  Once 06/23/22 1418 06/23/22 1506        Assessment/Plan POD 1 s/p I&D of  L chest wall/breast abscess by Dr. Marlou Starks on 07/01/22 - Febrile overnight with tachycardia this am and rising wbc count (although this is compared to wbc 3 days ago). Wound as above. Feel he needs further debridement in OR. Will discuss with MD timing of this.  - WTD dressing changes to wound. Breast binder.  - Cx's pending. Abx per ID  Addendum: Will plan for OR tomorrow. Okay to eat today, npo at midnight. Discussed indications, risks and benefits with patient. He agrees with plan.   FEN: NPO for now.  VTE: LMWH ID: cefepime/flagyl    - below per TRH -  T2DM, uncontrolled - A1c 11.6, SSI Alcohol abuse Tobacco abuse Obesity class II   LOS: 9 days    Jillyn Ledger , [facility] 07/02/2022, 9:21 AM Please see Amion for pager number during day hours 7:00am-4:30pm

## 2022-07-02 NOTE — Progress Notes (Signed)
Subjective:  Having significant anxiety chest and back pain as well as left lower flank pain and tenderness that I was not aware of   Antibiotics:  Anti-infectives (From admission, onward)    Start     Dose/Rate Route Frequency Ordered Stop   07/03/22 0000  vancomycin (VANCOREADY) IVPB 1250 mg/250 mL        1,250 mg 166.7 mL/hr over 90 Minutes Intravenous Every 12 hours 07/02/22 1119     07/02/22 1215  vancomycin (VANCOREADY) IVPB 2000 mg/400 mL        2,000 mg 200 mL/hr over 120 Minutes Intravenous  Once 07/02/22 1119     06/29/22 1200  ceFEPIme (MAXIPIME) 2 g in sodium chloride 0.9 % 100 mL IVPB        2 g 200 mL/hr over 30 Minutes Intravenous Every 8 hours 06/29/22 1005     06/29/22 1100  metroNIDAZOLE (FLAGYL) tablet 500 mg        500 mg Oral Every 12 hours 06/29/22 1005     06/28/22 1200  Ampicillin-Sulbactam (UNASYN) 3 g in sodium chloride 0.9 % 100 mL IVPB  Status:  Discontinued        3 g 200 mL/hr over 30 Minutes Intravenous Every 6 hours 06/28/22 0911 06/29/22 1005   06/24/22 0600  ceFEPIme (MAXIPIME) 2 g in sodium chloride 0.9 % 100 mL IVPB  Status:  Discontinued        2 g 200 mL/hr over 30 Minutes Intravenous Every 8 hours 06/23/22 2041 06/28/22 0911   06/24/22 0300  vancomycin (VANCOREADY) IVPB 1250 mg/250 mL  Status:  Discontinued        1,250 mg 166.7 mL/hr over 90 Minutes Intravenous Every 12 hours 06/23/22 1543 06/29/22 0819   06/23/22 2100  ceFEPIme (MAXIPIME) 2 g in sodium chloride 0.9 % 100 mL IVPB        2 g 200 mL/hr over 30 Minutes Intravenous  Once 06/23/22 2006 06/23/22 2153   06/23/22 2100  metroNIDAZOLE (FLAGYL) IVPB 500 mg  Status:  Discontinued        500 mg 100 mL/hr over 60 Minutes Intravenous Every 12 hours 06/23/22 2006 06/28/22 0849   06/23/22 1600  vancomycin (VANCOREADY) IVPB 1500 mg/300 mL        1,500 mg 150 mL/hr over 120 Minutes Intravenous  Once 06/23/22 1514 06/23/22 1912   06/23/22 1430  vancomycin (VANCOCIN) IVPB 1000  mg/200 mL premix        1,000 mg 200 mL/hr over 60 Minutes Intravenous  Once 06/23/22 1418 06/23/22 1624   06/23/22 1430  cefTRIAXone (ROCEPHIN) 2 g in sodium chloride 0.9 % 100 mL IVPB        2 g 200 mL/hr over 30 Minutes Intravenous  Once 06/23/22 1418 06/23/22 1506       Medications: Scheduled Meds:  enoxaparin (LOVENOX) injection  55 mg Subcutaneous Q24H   feeding supplement  237 mL Oral BID WC   folic acid  1 mg Oral Daily   insulin aspart  0-15 Units Subcutaneous TID WC   insulin aspart  0-5 Units Subcutaneous QHS   insulin aspart protamine- aspart  16 Units Subcutaneous BID WC   metroNIDAZOLE  500 mg Oral Q12H   multivitamin with minerals  1 tablet Oral Daily   nicotine  14 mg Transdermal Daily   nutrition supplement (JUVEN)  1 packet Oral BID BM   polyethylene glycol  17 g Oral Daily   thiamine  100 mg Oral Daily   Continuous Infusions:  ceFEPime (MAXIPIME) IV 2 g (07/02/22 0645)   [START ON 07/03/2022] vancomycin     vancomycin     PRN Meds:.acetaminophen **OR** [DISCONTINUED] acetaminophen, ALPRAZolam, HYDROmorphone (DILAUDID) injection, ibuprofen, metoprolol tartrate, morphine injection, ondansetron **OR** ondansetron (ZOFRAN) IV, mouth rinse, oxyCODONE    Objective: Weight change:   Intake/Output Summary (Last 24 hours) at 07/02/2022 1217 Last data filed at 07/01/2022 1948 Gross per 24 hour  Intake 440 ml  Output --  Net 440 ml    Blood pressure 127/78, pulse (!) 112, temperature 98.8 F (37.1 C), temperature source Oral, resp. rate 12, height 5\' 9"  (1.753 m), weight 116.9 kg, SpO2 96 %. Temp:  [98.8 F (37.1 C)-102.4 F (39.1 C)] 98.8 F (37.1 C) (12/09 1143) Pulse Rate:  [112-122] 112 (12/09 0341) Resp:  [12-19] 12 (12/09 1143) BP: (106-141)/(59-88) 127/78 (12/09 1143) SpO2:  [92 %-100 %] 96 % (12/09 1143) Weight:  [116.9 kg] 116.9 kg (12/09 21300627)  Physical Exam: Physical Exam Constitutional:      Appearance: He is well-developed.  HENT:      Head: Normocephalic and atraumatic.  Eyes:     Conjunctiva/sclera: Conjunctivae normal.  Cardiovascular:     Rate and Rhythm: Normal rate and regular rhythm.  Pulmonary:     Effort: Pulmonary effort is normal. No respiratory distress.     Breath sounds: No wheezing.  Chest:     Comments: Chest bandaged he has EXQUISITE tenderness in left flank distal to his abscess with induration concerning for pyomyositis Abdominal:     General: There is no distension.     Palpations: Abdomen is soft.  Musculoskeletal:        General: Normal range of motion.     Cervical back: Normal range of motion and neck supple.  Skin:    General: Skin is warm and dry.     Findings: No erythema or rash.  Neurological:     General: No focal deficit present.     Mental Status: He is alert and oriented to person, place, and time.  Psychiatric:        Mood and Affect: Mood normal.        Behavior: Behavior normal.        Thought Content: Thought content normal.        Judgment: Judgment normal.   Chest bandaged CBC:    BMET Recent Labs    07/01/22 1153 07/02/22 0652  NA 133* 133*  K 4.1 3.6  CL 101 97*  CO2 22 23  GLUCOSE 134* 197*  BUN 7 10  CREATININE 0.84 0.69  CALCIUM 8.1* 8.5*      Liver Panel  No results for input(s): "PROT", "ALBUMIN", "AST", "ALT", "ALKPHOS", "BILITOT", "BILIDIR", "IBILI" in the last 72 hours.     Sedimentation Rate No results for input(s): "ESRSEDRATE" in the last 72 hours. C-Reactive Protein No results for input(s): "CRP" in the last 72 hours.  Micro Results: Recent Results (from the past 720 hour(s))  MRSA Next Gen by PCR, Nasal     Status: None   Collection Time: 06/23/22 12:36 AM   Specimen: Nasal Mucosa; Nasal Swab  Result Value Ref Range Status   MRSA by PCR Next Gen NOT DETECTED NOT DETECTED Final    Comment: (NOTE) The GeneXpert MRSA Assay (FDA approved for NASAL specimens only), is one component of a comprehensive MRSA colonization  surveillance program. It is not intended to diagnose MRSA infection nor to guide  or monitor treatment for MRSA infections. Test performance is not FDA approved in patients less than 63 years old. Performed at [facility], [address] [place], [place] [address]   Blood culture (routine x 2)     Status: None   Collection Time: 06/23/22  1:40 PM   Specimen: BLOOD RIGHT ARM  Result Value Ref Range Status   Specimen Description BLOOD RIGHT ARM  Final   Special Requests   Final    BOTTLES DRAWN AEROBIC AND ANAEROBIC Blood Culture results may not be optimal due to an excessive volume of blood received in culture bottles   Culture   Final    NO GROWTH 5 DAYS Performed at [facility], [address] [place], [place] [address]    Report Status 06/28/2022 FINAL  Final  Blood culture (routine x 2)     Status: None   Collection Time: 06/23/22  4:10 PM   Specimen: BLOOD  Result Value Ref Range Status   Specimen Description BLOOD SITE NOT SPECIFIED  Final   Special Requests   Final    BOTTLES DRAWN AEROBIC AND ANAEROBIC Blood Culture results may not be optimal due to an excessive volume of blood received in culture bottles   Culture   Final    NO GROWTH 5 DAYS Performed at [facility], [address], [place], [place] [address]    Report Status 06/28/2022 FINAL  Final  SARS Coronavirus 2 by RT PCR (hospital order, performed in [facility] hospital lab) *cepheid single result test* Anterior Nasal Swab     Status: None   Collection Time: 06/25/22  2:26 PM   Specimen: Anterior Nasal Swab  Result Value Ref Range Status   SARS Coronavirus 2 by RT PCR NEGATIVE NEGATIVE Final    Comment: (NOTE) SARS-CoV-2 target nucleic acids are NOT DETECTED.  The SARS-CoV-2 RNA is generally detectable in upper and lower respiratory specimens during the acute phase of infection. The lowest concentration of SARS-CoV-2 viral copies this assay can detect is 250 copies / mL. A negative  result does not preclude SARS-CoV-2 infection and should not be used as the sole basis for treatment or other patient management decisions.  A negative result may occur with improper specimen collection / handling, submission of specimen other than nasopharyngeal swab, presence of viral mutation(s) within the areas targeted by this assay, and inadequate number of viral copies (<250 copies / mL). A negative result must be combined with clinical observations, patient history, and epidemiological information.  Fact Sheet for Patients:   RoadLapTop.co.za  Fact Sheet for Healthcare Providers: http://kim-miller.com/  This test is not yet approved or  cleared by the [place] FDA and has been authorized for detection and/or diagnosis of SARS-CoV-2 by FDA under an Emergency Use Authorization (EUA).  This EUA will remain in effect (meaning this test can be used) for the duration of the COVID-19 declaration under Section 564(b)(1) of the Act, 21 U.S.C. section 360bbb-3(b)(1), unless the authorization is terminated or revoked sooner.  Performed at [facility], [address], [place], [place] [address]   Aerobic/Anaerobic Culture w Gram Stain (surgical/deep wound)     Status: None (Preliminary result)   Collection Time: 06/28/22 11:53 AM   Specimen: Abscess  Result Value Ref Range Status   Specimen Description ABSCESS  Final   Special Requests LEFT BREAST  Final   Gram Stain   Final    ABUNDANT WBC PRESENT,BOTH PMN AND MONONUCLEAR  ABUNDANT GRAM NEGATIVE RODS    Culture   Final    HOLDING FOR POSSIBLE ANAEROBE Performed at [facility], [address], [place], [place] [address]    Report Status PENDING  Incomplete  Aerobic/Anaerobic Culture w Gram Stain (surgical/deep wound)     Status: None (Preliminary result)   Collection Time: 07/01/22  9:48 AM   Specimen: PATH Breast other; Tissue  Result Value Ref Range Status    Specimen Description WOUND  Final   Special Requests ABSCESS LEFT BREAST  Final   Gram Stain   Final    FEW GRAM POSITIVE COCCI RARE GRAM POSITIVE RODS RARE WBC PRESENT, PREDOMINANTLY PMN Performed at [facility], [address] [place], [place] [address]    Culture PENDING  Incomplete   Report Status PENDING  Incomplete    Studies/Results: No results found.    Assessment/Plan:  INTERVAL HISTORY: Repeat cultures from the left breast is showing gram-positive cocci gram-positive rods with cultures incubating.  He now has left-sided flank tenderness and erythema concerning to me for potential more distal pyomyositis  Principal Problem:   Cellulitis of chest wall Active Problems:   Hyperglycemia   Hyponatremia   Tobacco abuse   Alcohol abuse   Breast abscess   Hidradenitis suppurativa   Dental infection   Morbid obesity (HCC)   Type 2 diabetes mellitus with hyperglycemia (HCC)    [name] is a 36 y.o. male with morbid obesity newly diagnosed noticed diabetes mellitus likely retinitis suppurativa with recurrent infections in his soft tissues in particular axilla groin now admitted with eft soft tissue breast infection   #1  Left small abscess  The patient has now been taken to the operating room by Dr Carolynne Edouard.Large amount of pus was encountered and irrigated there was also large amount necrotic tissue in the breast and chest wall which was excised and removed.  Repeat cultures on Gram stain are showing gram-positive cocci and gram positive rods.  I have added daptomycin to his cefepime and Flagyl.  Prior culture is potentially growing an anaerobe.  #2 left-sided flank pain and tenderness: I am concerned he has more distal pyomyositis and will need further debridement of the site.  I am ordering an ultrasound of this area to evaluate it but ultrasounds and other imaging of been suboptimal in evaluating him during this admission.  I think his body habitus makes  this quite challenging.  3.  Hidradenitis suppurativa: He will need connected to dermatology.  4.  Diabetes mellitus: Would benefit from ozempic or simliar drug I would think .   I spent 52 minutes with the patient including than 50% of the time in face to face counseling of the patient guarding his left chest abscess and now concerns for pyomyositis in the left flank distal to this along with review of medical records in preparation for the visit and during the visit and in coordination of his care.       LOS: 9 days   Acey Lav 07/02/2022, 12:17 PM

## 2022-07-02 NOTE — Inpatient Diabetes Management (Signed)
Inpatient Diabetes Program Recommendations  AACE/ADA: New Consensus Statement on Inpatient Glycemic Control   Target Ranges:  Prepandial:   less than 140 mg/dL      Peak postprandial:   less than 180 mg/dL (1-2 hours)      Critically ill patients:  140 - 180 mg/dL    Latest Reference Range & Units 07/01/22 08:05 07/01/22 10:54 07/01/22 15:43 07/01/22 21:21 07/02/22 06:08  Glucose-Capillary 70 - 99 mg/dL 193 (H)  Novolog 2 units 149 (H)  Novolog 2 units 122 (H)  Novolog 2 units 183 (H) 196 (H)  Novolog 3 units    Review of Glycemic Control  Diabetes history: New DM dx this admission Outpatient Diabetes medications: NA Current orders for Inpatient glycemic control: 70/30 16 units BID, Novolog 0-15 units TID with meals, Novolog 0-5 units QHS  Inpatient Diabetes Program Recommendations:    Insulin: Noted 70/30 was not given on 07/01/22 and not given this morning due to patient being NPO. Noted carb modified diet ordered today and patient will be NPO again after midnight with plan to go back to OR on 07/03/22.  NOTE: Noted consult for Diabetes Coordinator. Diabetes Coordinator is not on campus over the weekend but available by pager from 8am to 5pm for questions or concerns. Chart reviewed. Patient admitted on 06/23/22 with cellulitis of chest wall, hyperglycemia (newly dx with DM), hyponatremia, and ETOH abuse. Patient was seen by inpatient diabetes coordinator on 06/24/22 and new DM dx with discussed at length. Seen by inpatient diabetes coordinator again on 06/27/22 to discuss insulin. Patient did not receive any 70/30 on 07/01/22, was NPO on 12/8 and NPO this morning. Carb mod diet was ordered today and per surgery note patient will go back to OR on 07/03/22.  Inpatient diabetes team will follow along while inpatient.  Thanks, Orlando Penner, RN, MSN, CDCES Diabetes Coordinator Inpatient Diabetes Program [phone] (Team Pager from 8am to 5pm)

## 2022-07-03 ENCOUNTER — Other Ambulatory Visit: Payer: Self-pay

## 2022-07-03 ENCOUNTER — Encounter (HOSPITAL_COMMUNITY): Payer: Self-pay | Admitting: Internal Medicine

## 2022-07-03 ENCOUNTER — Inpatient Hospital Stay (HOSPITAL_COMMUNITY): Payer: No Typology Code available for payment source | Admitting: Anesthesiology

## 2022-07-03 ENCOUNTER — Encounter (HOSPITAL_COMMUNITY)
Admission: EM | Disposition: A | Discharge status: Home Health | Payer: Self-pay | Source: Home / Self Care | Attending: Internal Medicine

## 2022-07-03 DIAGNOSIS — M726 Necrotizing fasciitis: Secondary | ICD-10-CM

## 2022-07-03 DIAGNOSIS — L02213 Cutaneous abscess of chest wall: Secondary | ICD-10-CM | POA: Diagnosis not present

## 2022-07-03 DIAGNOSIS — L03313 Cellulitis of chest wall: Secondary | ICD-10-CM | POA: Diagnosis not present

## 2022-07-03 HISTORY — PX: IRRIGATION AND DEBRIDEMENT ABSCESS: SHX5252

## 2022-07-03 LAB — COMPREHENSIVE METABOLIC PANEL
ALT: 46 U/L — ABNORMAL HIGH (ref 0–44)
AST: 52 U/L — ABNORMAL HIGH (ref 15–41)
Albumin: 1.6 g/dL — ABNORMAL LOW (ref 3.5–5.0)
Alkaline Phosphatase: 151 U/L — ABNORMAL HIGH (ref 38–126)
Anion gap: 10 (ref 5–15)
BUN: 12 mg/dL (ref 6–20)
CO2: 24 mmol/L (ref 22–32)
Calcium: 8.5 mg/dL — ABNORMAL LOW (ref 8.9–10.3)
Chloride: 101 mmol/L (ref 98–111)
Creatinine, Ser: 0.78 mg/dL (ref 0.61–1.24)
GFR, Estimated: >60 mL/min (ref >60)
Glucose, Bld: 201 mg/dL — ABNORMAL HIGH (ref 70–99)
Potassium: 3.4 mmol/L — ABNORMAL LOW (ref 3.5–5.1)
Sodium: 135 mmol/L (ref 135–145)
Total Bilirubin: 1 mg/dL (ref 0.3–1.2)
Total Protein: 7.8 g/dL (ref 6.5–8.1)

## 2022-07-03 LAB — GLUCOSE, CAPILLARY
Glucose-Capillary: 165 mg/dL — ABNORMAL HIGH (ref 70–99)
Glucose-Capillary: 203 mg/dL — ABNORMAL HIGH (ref 70–99)
Glucose-Capillary: 176 mg/dL — ABNORMAL HIGH (ref 70–99)
Glucose-Capillary: 158 mg/dL — ABNORMAL HIGH (ref 70–99)
Glucose-Capillary: 203 mg/dL — ABNORMAL HIGH (ref 70–99)
Glucose-Capillary: 141 mg/dL — ABNORMAL HIGH (ref 70–99)

## 2022-07-03 LAB — CBC
HCT: 29.4 % — ABNORMAL LOW (ref 39.0–52.0)
Hemoglobin: 10.8 g/dL — ABNORMAL LOW (ref 13.0–17.0)
MCH: 27.8 pg (ref 26.0–34.0)
MCHC: 36.7 g/dL — ABNORMAL HIGH (ref 30.0–36.0)
MCV: 75.6 fL — ABNORMAL LOW (ref 80.0–100.0)
Platelets: 683 10*3/uL — ABNORMAL HIGH (ref 150–400)
RBC: 3.89 MIL/uL — ABNORMAL LOW (ref 4.22–5.81)
RDW: 13.3 % (ref 11.5–15.5)
WBC: 27.9 10*3/uL — ABNORMAL HIGH (ref 4.0–10.5)
nRBC: 0 % (ref 0.0–0.2)

## 2022-07-03 LAB — MAGNESIUM: Magnesium: 2.2 mg/dL (ref 1.7–2.4)

## 2022-07-03 SURGERY — IRRIGATION AND DEBRIDEMENT ABSCESS
Anesthesia: General | Site: Chest | Laterality: Left

## 2022-07-03 MED ORDER — OXYCODONE HCL 5 MG/5ML PO SOLN: 5.0000 mg | Freq: Once | ORAL | Status: DC | PRN

## 2022-07-03 MED ORDER — ORAL CARE MOUTH RINSE: 15.0000 mL | Freq: Once | OROMUCOSAL | Status: AC

## 2022-07-03 MED ORDER — FENTANYL CITRATE (PF) 100 MCG/2ML IJ SOLN
INTRAMUSCULAR | Status: AC
  Filled 2022-07-03: qty 2

## 2022-07-03 MED ORDER — FENTANYL CITRATE (PF) 250 MCG/5ML IJ SOLN
INTRAMUSCULAR | Status: AC
  Filled 2022-07-03: qty 5

## 2022-07-03 MED ORDER — FENTANYL CITRATE (PF) 250 MCG/5ML IJ SOLN
INTRAMUSCULAR | Status: DC | PRN
  Administered 2022-07-03 (×2): 25 ug via INTRAVENOUS
  Administered 2022-07-03 (×4): 50 ug via INTRAVENOUS

## 2022-07-03 MED ORDER — ACETAMINOPHEN 10 MG/ML IV SOLN
1000.0000 mg | Freq: Once | INTRAVENOUS | Status: DC | PRN
  Administered 2022-07-03: 1000 mg via INTRAVENOUS

## 2022-07-03 MED ORDER — CHLORHEXIDINE GLUCONATE 0.12 % MT SOLN
OROMUCOSAL | Status: AC
  Administered 2022-07-03: 15 mL via OROMUCOSAL
  Filled 2022-07-03: qty 15

## 2022-07-03 MED ORDER — LIDOCAINE 2% (20 MG/ML) 5 ML SYRINGE
INTRAMUSCULAR | Status: DC | PRN
  Administered 2022-07-03: 60 mg via INTRAVENOUS

## 2022-07-03 MED ORDER — AMISULPRIDE (ANTIEMETIC) 5 MG/2ML IV SOLN: 10.0000 mg | Freq: Once | INTRAVENOUS | Status: DC | PRN

## 2022-07-03 MED ORDER — ACETAMINOPHEN 325 MG PO TABS: 325.0000 mg | ORAL_TABLET | ORAL | Status: DC | PRN

## 2022-07-03 MED ORDER — PROPOFOL 10 MG/ML IV BOLUS
INTRAVENOUS | Status: AC
  Filled 2022-07-03: qty 20

## 2022-07-03 MED ORDER — 0.9 % SODIUM CHLORIDE (POUR BTL) OPTIME
TOPICAL | Status: DC | PRN
  Administered 2022-07-03: 1000 mL

## 2022-07-03 MED ORDER — FENTANYL CITRATE (PF) 100 MCG/2ML IJ SOLN
25.0000 ug | INTRAMUSCULAR | Status: DC | PRN
  Administered 2022-07-03: 50 ug via INTRAVENOUS

## 2022-07-03 MED ORDER — SODIUM CHLORIDE 0.9 % IV SOLN
8.0000 mg/kg | Freq: Every day | INTRAVENOUS | Status: DC
  Administered 2022-07-03 – 2022-07-05 (×3): 700 mg via INTRAVENOUS
  Filled 2022-07-03 (×5): qty 14

## 2022-07-03 MED ORDER — MIDAZOLAM HCL 2 MG/2ML IJ SOLN
INTRAMUSCULAR | Status: AC
  Filled 2022-07-03: qty 2

## 2022-07-03 MED ORDER — INSULIN ASPART 100 UNIT/ML IJ SOLN
0.0000 [IU] | INTRAMUSCULAR | Status: AC | PRN
  Administered 2022-07-03: 4 [IU] via SUBCUTANEOUS
  Administered 2022-07-03: 2 [IU] via SUBCUTANEOUS

## 2022-07-03 MED ORDER — OXYCODONE HCL 5 MG PO TABS: 5.0000 mg | ORAL_TABLET | Freq: Once | ORAL | Status: DC | PRN

## 2022-07-03 MED ORDER — POTASSIUM CHLORIDE CRYS ER 20 MEQ PO TBCR
40.0000 meq | EXTENDED_RELEASE_TABLET | Freq: Once | ORAL | Status: AC
  Administered 2022-07-03: 40 meq via ORAL
  Filled 2022-07-03: qty 2

## 2022-07-03 MED ORDER — LACTATED RINGERS IV SOLN: INTRAVENOUS | Status: DC

## 2022-07-03 MED ORDER — FUROSEMIDE 10 MG/ML IJ SOLN
60.0000 mg | Freq: Once | INTRAMUSCULAR | Status: AC
  Administered 2022-07-03: 60 mg via INTRAVENOUS
  Filled 2022-07-03: qty 6

## 2022-07-03 MED ORDER — PROPOFOL 10 MG/ML IV BOLUS
INTRAVENOUS | Status: DC | PRN
  Administered 2022-07-03: 200 mg via INTRAVENOUS

## 2022-07-03 MED ORDER — ACETAMINOPHEN 10 MG/ML IV SOLN
INTRAVENOUS | Status: AC
  Filled 2022-07-03: qty 100

## 2022-07-03 MED ORDER — PROMETHAZINE HCL 25 MG/ML IJ SOLN: 6.2500 mg | INTRAMUSCULAR | Status: DC | PRN

## 2022-07-03 MED ORDER — CHLORHEXIDINE GLUCONATE 0.12 % MT SOLN: 15.0000 mL | Freq: Once | OROMUCOSAL | Status: AC

## 2022-07-03 MED ORDER — ACETAMINOPHEN 160 MG/5ML PO SOLN: 325.0000 mg | ORAL | Status: DC | PRN

## 2022-07-03 MED ORDER — MIDAZOLAM HCL 2 MG/2ML IJ SOLN
INTRAMUSCULAR | Status: DC | PRN
  Administered 2022-07-03: 2 mg via INTRAVENOUS

## 2022-07-03 SURGICAL SUPPLY — 31 items
BAG COUNTER SPONGE SURGICOUNT (BAG) ×2 IMPLANT
BAG SPNG CNTER NS LX DISP (BAG) ×1
BINDER BREAST 3XL (GAUZE/BANDAGES/DRESSINGS) IMPLANT
BNDG GAUZE DERMACEA FLUFF 4 (GAUZE/BANDAGES/DRESSINGS) IMPLANT
BNDG GZE DERMACEA 4 6PLY (GAUZE/BANDAGES/DRESSINGS) ×1
CANISTER SUCT 3000ML PPV (MISCELLANEOUS) ×2 IMPLANT
COVER SURGICAL LIGHT HANDLE (MISCELLANEOUS) ×2 IMPLANT
DRAPE LAPAROSCOPIC ABDOMINAL (DRAPES) ×2 IMPLANT
ELECT CAUTERY BLADE 6.4 (BLADE) ×2 IMPLANT
ELECT REM PT RETURN 9FT ADLT (ELECTROSURGICAL) ×1
ELECTRODE REM PT RTRN 9FT ADLT (ELECTROSURGICAL) ×2 IMPLANT
GAUZE PAD ABD 8X10 STRL (GAUZE/BANDAGES/DRESSINGS) ×2 IMPLANT
GAUZE SPONGE 4X4 12PLY STRL (GAUZE/BANDAGES/DRESSINGS) ×2 IMPLANT
GLOVE BIO SURGEON STRL SZ8 (GLOVE) ×2 IMPLANT
GLOVE BIOGEL PI IND STRL 8 (GLOVE) ×2 IMPLANT
GOWN STRL REUS W/ TWL LRG LVL3 (GOWN DISPOSABLE) ×2 IMPLANT
GOWN STRL REUS W/ TWL XL LVL3 (GOWN DISPOSABLE) ×2 IMPLANT
GOWN STRL REUS W/TWL LRG LVL3 (GOWN DISPOSABLE) ×2
GOWN STRL REUS W/TWL XL LVL3 (GOWN DISPOSABLE) ×1
KIT BASIN OR (CUSTOM PROCEDURE TRAY) ×2 IMPLANT
KIT TURNOVER KIT B (KITS) ×2 IMPLANT
NS IRRIG 1000ML POUR BTL (IV SOLUTION) ×2 IMPLANT
PACK GENERAL/GYN (CUSTOM PROCEDURE TRAY) ×2 IMPLANT
PAD ARMBOARD 7.5X6 YLW CONV (MISCELLANEOUS) ×2 IMPLANT
PENCIL SMOKE EVACUATOR (MISCELLANEOUS) ×2 IMPLANT
SPONGE T-LAP 18X18 ~~LOC~~+RFID (SPONGE) IMPLANT
SWAB COLLECTION DEVICE MRSA (MISCELLANEOUS) IMPLANT
SWAB CULTURE ESWAB REG 1ML (MISCELLANEOUS) IMPLANT
TAPE CLOTH SURG 4X10 WHT LF (GAUZE/BANDAGES/DRESSINGS) IMPLANT
TOWEL GREEN STERILE (TOWEL DISPOSABLE) ×2 IMPLANT
TOWEL GREEN STERILE FF (TOWEL DISPOSABLE) ×2 IMPLANT

## 2022-07-03 NOTE — Anesthesia Procedure Notes (Signed)
Procedure Name: LMA Insertion Date/Time: 07/03/2022 9:12 AM  Performed by: Macie Burows, CRNAPre-anesthesia Checklist: Patient identified, Emergency Drugs available, Suction available and Patient being monitored Patient Re-evaluated:Patient Re-evaluated prior to induction Oxygen Delivery Method: Circle system utilized Preoxygenation: Pre-oxygenation with 100% oxygen Induction Type: IV induction Ventilation: Mask ventilation without difficulty LMA: LMA inserted LMA Size: 5.0 Tube type: Oral Number of attempts: 1 Placement Confirmation: positive ETCO2 and breath sounds checked- equal and bilateral Tube secured with: Tape Dental Injury: Teeth and Oropharynx as per pre-operative assessment

## 2022-07-03 NOTE — Anesthesia Preprocedure Evaluation (Addendum)
Anesthesia Evaluation  Patient identified by MRN, date of birth, ID band Patient awake    Reviewed: Allergy & Precautions, NPO status , Patient's Chart, lab work & pertinent test results  Airway Mallampati: II  TM Distance: >3 FB Neck ROM: Full    Dental  (+) Teeth Intact, Dental Advisory Given   Pulmonary asthma , Current Smoker and Patient abstained from smoking.   breath sounds clear to auscultation       Cardiovascular  Rhythm:Regular Rate:Normal     Neuro/Psych   Anxiety        GI/Hepatic negative GI ROS, Neg liver ROS,,,  Endo/Other  diabetes    Renal/GU negative Renal ROS     Musculoskeletal   Abdominal   Peds  Hematology negative hematology ROS (+)   Anesthesia Other Findings   Reproductive/Obstetrics                             Anesthesia Physical Anesthesia Plan  ASA: 2  Anesthesia Plan: General   Post-op Pain Management:    Induction: Intravenous  PONV Risk Score and Plan: 2 and Ondansetron, Dexamethasone and Midazolam  Airway Management Planned: LMA  Additional Equipment: None  Intra-op Plan:   Post-operative Plan: Extubation in OR  Informed Consent: I have reviewed the patients History and Physical, chart, labs and discussed the procedure including the risks, benefits and alternatives for the proposed anesthesia with the patient or authorized representative who has indicated his/her understanding and acceptance.     Dental advisory given  Plan Discussed with: CRNA  Anesthesia Plan Comments:        Anesthesia Quick Evaluation

## 2022-07-03 NOTE — Transfer of Care (Signed)
Immediate Anesthesia Transfer of Care Note  Patient: [name]  Procedure(s) Performed: IRRIGATION AND DEBRIDEMENT CHEST WALL / Breast ABSCESS (Left: Chest)  Patient Location: PACU  Anesthesia Type:General  Level of Consciousness: awake, alert , and oriented  Airway & Oxygen Therapy: Patient Spontanous Breathing  Post-op Assessment: Report given to RN and Post -op Vital signs reviewed and stable  Post vital signs: Reviewed and stable  Last Vitals:  Vitals Value Taken Time  BP 146/86 07/03/22 1030  Temp    Pulse 100 07/03/22 1032  Resp 10 07/03/22 1032  SpO2 92 % 07/03/22 1032  Vitals shown include unvalidated device data.  Last Pain:  Vitals:   07/03/22 0804  TempSrc: Oral  PainSc:       Patients Stated Pain Goal: 0 (07/02/22 0641)  Complications: No notable events documented.

## 2022-07-03 NOTE — Op Note (Signed)
07/03/2022  10:17 AM  PATIENT:  [name]  36 y.o. male  PRE-OPERATIVE DIAGNOSIS:  Left chest wall necrotizing soft tissue infection  POST-OPERATIVE DIAGNOSIS:  Left chest wall necrotizing soft tissue infection, further infected and necrotic tissue medially and laterally from initial debridement, no significant purulence or necrosis lower on flank x 2  PROCEDURE:  Procedure(s): Debridement left chest wall necrotizing soft tissue infection 30cm x 9cm x 4cm deep, incision and drainage left flank x 2  SURGEON:  Surgeon(s): Violeta Gelinas, MD  ASSISTANTS: none   ANESTHESIA:   general  EBL:  No intake/output data recorded.  BLOOD ADMINISTERED:none  DRAINS: none   SPECIMEN:  Excision  DISPOSITION OF SPECIMEN:  PATHOLOGY  COUNTS:  YES  DICTATION: .Dragon Dictation Excisional debridement:  1.  Procedure detail: Informed consent was obtained.  His site was marked.  He is on IV antibiotics per infectious disease.  He was brought to the operating room and general anesthesia was administered by the anesthesia staff.  He was placed in left side up lateral position and his side and chest wall were all prepped and draped in a sterile fashion.  We did a timeout procedure.  I inspected the wound and there was evidence of purulent tissue medial to his previous debridement I excised the skin and subcutaneous fat medially to encompass this additional infected and necrotic tissue.  I then extended the incision laterally as there was a lot of of necrotic subcutaneous fat and muscle including some of his latissimus.  I debrided all of this necrotic tissue had to keep extending the incision more laterally.  Once all of the nonviable tissue was debrided which included some of the central previous portion of the wound as well, I obtained hemostasis with cautery.  I thoroughly irrigated the wound.  Due to the erythema and edema further down on his flank I made 2 additional incisions to inspect the  subcutaneous tissues there there were no purulent pockets or dead tissue in either 1 of those.  Cautery was used to get hemostasis and both of those after exploring them.  Next the large wound was noted to be hemostatic and I packed it with saline soaked Kerlix the 2 other flank wounds were packed with saline gauze wet-to-dry's and numerous ABD pads and a breast binder were placed.  All counts were correct.  He tolerated the procedure well and was taken recovery room in stable condition.  2.  Tool used for debridement (curette, scapel, etc.)  cautery  3.  Frequency of surgical debridement.   Second time  4.  Measurement of total devitalized tissue (wound surface) before and after surgical debridement.   Before: 6cm x 8cm x 3cm deep. After 30cm x 9cm, x 4cm deep  5.  Area and depth of devitalized tissue removed from wound.  above  6.  Blood loss and description of tissue removed.  Necrotic skin, fat and muscle with purulence  7.  Evidence of the progress of the wound's response to treatment.  A.  Current wound volume (current dimensions and depth).  above  B.  Presence (and extent of) of infection.  present  C.  Presence (and extent of) of non viable tissue.  present  D.  Other material in the wound that is expected to inhibit healing.  no  8.  Was there any viable tissue removed (measurements): minimal  PATIENT DISPOSITION:  PACU - hemodynamically stable.   Delay start of Pharmacological VTE agent (>24hrs) due to surgical  blood loss or risk of bleeding:  no  Violeta Gelinas, MD, MPH, FACS Pager: [phone]  12/10/202310:17 AM

## 2022-07-03 NOTE — Progress Notes (Signed)
Pts IV infiltrated and was removed. IV antibiotics were paused and IV team was consulted. A new IV was placed. IV team stated the patient might be a good candidate for a PICC or midline. Pt has had multiple IV's to go bad, he is a hard stick for labs and is in general tired of being stuck multiple times a day. MD was paged and told about the patients concerns and stated he would speak with the patient tomorrow about possibly placing a PICC or Midline.

## 2022-07-03 NOTE — Progress Notes (Signed)
Patient ID: [name], male   DOB: June 06, 1986, 36 y.o.   MRN: [id number] 2 Days Post-Op    Subjective: No new complaints ROS negative except as listed above. Objective: Vital signs in last 24 hours: Temp:  [98.6 F (37 C)-100.3 F (37.9 C)] 98.6 F (37 C) (12/10 0418) Pulse Rate:  [101] 101 (12/10 0316) Resp:  [12-22] 17 (12/10 0418) BP: (110-151)/(68-90) 151/82 (12/10 0418) SpO2:  [92 %-96 %] 96 % (12/09 1918) Last BM Date : 07/02/22  Intake/Output from previous day: 12/09 0701 - 12/10 0700 In: 1274.7 [IV Piggyback:1274.7] Out: 900 [Urine:900] Intake/Output this shift: No intake/output data recorded.  General appearance: alert and cooperative Resp: clear to auscultation bilaterally L chest wall wound dressed, induration extends posterior to this, some erythema lower on flank as well  Lab Results: CBC  Recent Labs    07/02/22 0652 07/03/22 0019  WBC 32.1* 27.9*  HGB 11.4* 10.8*  HCT 32.2* 29.4*  PLT 641* 683*   BMET Recent Labs    07/02/22 0652 07/03/22 0019  NA 133* 135  K 3.6 3.4*  CL 97* 101  CO2 23 24  GLUCOSE 197* 201*  BUN 10 12  CREATININE 0.69 0.78  CALCIUM 8.5* 8.5*   PT/INR No results for input(s): "LABPROT", "INR" in the last 72 hours. ABG No results for input(s): "PHART", "HCO3" in the last 72 hours.  Invalid input(s): "PCO2", "PO2"  Studies/Results: US Abdomen Limited  Result Date: 07/02/2022 CLINICAL DATA:  Flank mass EXAM: ULTRASOUND ABDOMEN LIMITED COMPARISON:  LEFT axillary ultrasound, 06/26/2022 FINDINGS: Focused ultrasound at the area of palpable concern, along the LEFT breast/axilla. Indurated subcutaneous tissues . No adenopathy or abnormal fluid collection within the imaged area. IMPRESSION: Subcutaneous edema. No adenopathy or abnormal fluid collection within the imaged area of the LEFT breast/axilla. Electronically Signed   By: Roanna Banning M.D.   On: 07/02/2022 15:50    Anti-infectives: Anti-infectives (From admission,  onward)    Start     Dose/Rate Route Frequency Ordered Stop   07/03/22 0000  vancomycin (VANCOREADY) IVPB 1250 mg/250 mL        1,250 mg 166.7 mL/hr over 90 Minutes Intravenous Every 12 hours 07/02/22 1119     07/02/22 1215  vancomycin (VANCOREADY) IVPB 2000 mg/400 mL        2,000 mg 200 mL/hr over 120 Minutes Intravenous  Once 07/02/22 1119 07/03/22 0144   06/29/22 1200  ceFEPIme (MAXIPIME) 2 g in sodium chloride 0.9 % 100 mL IVPB        2 g 200 mL/hr over 30 Minutes Intravenous Every 8 hours 06/29/22 1005     06/29/22 1100  metroNIDAZOLE (FLAGYL) tablet 500 mg        500 mg Oral Every 12 hours 06/29/22 1005     06/28/22 1200  Ampicillin-Sulbactam (UNASYN) 3 g in sodium chloride 0.9 % 100 mL IVPB  Status:  Discontinued        3 g 200 mL/hr over 30 Minutes Intravenous Every 6 hours 06/28/22 0911 06/29/22 1005   06/24/22 0600  ceFEPIme (MAXIPIME) 2 g in sodium chloride 0.9 % 100 mL IVPB  Status:  Discontinued        2 g 200 mL/hr over 30 Minutes Intravenous Every 8 hours 06/23/22 2041 06/28/22 0911   06/24/22 0300  vancomycin (VANCOREADY) IVPB 1250 mg/250 mL  Status:  Discontinued        1,250 mg 166.7 mL/hr over 90 Minutes Intravenous Every 12 hours 06/23/22 1543 06/29/22  2993   06/23/22 2100  ceFEPIme (MAXIPIME) 2 g in sodium chloride 0.9 % 100 mL IVPB        2 g 200 mL/hr over 30 Minutes Intravenous  Once 06/23/22 2006 06/23/22 2153   06/23/22 2100  metroNIDAZOLE (FLAGYL) IVPB 500 mg  Status:  Discontinued        500 mg 100 mL/hr over 60 Minutes Intravenous Every 12 hours 06/23/22 2006 06/28/22 0849   06/23/22 1600  vancomycin (VANCOREADY) IVPB 1500 mg/300 mL        1,500 mg 150 mL/hr over 120 Minutes Intravenous  Once 06/23/22 1514 06/23/22 1912   06/23/22 1430  vancomycin (VANCOCIN) IVPB 1000 mg/200 mL premix        1,000 mg 200 mL/hr over 60 Minutes Intravenous  Once 06/23/22 1418 06/23/22 1624   06/23/22 1430  cefTRIAXone (ROCEPHIN) 2 g in sodium chloride 0.9 % 100 mL IVPB         2 g 200 mL/hr over 30 Minutes Intravenous  Once 06/23/22 1418 06/23/22 1506       Assessment/Plan: POD 2 s/p I&D of L chest wall/breast abscess by Dr. Carolynne Edouard on 07/01/22 - U/S noted, return to OR this AM for further debridement. Procedure, risks, and benefits again discussed and he agrees. I marked his site. - Cx's pending. Abx per ID  FEN: NPO for OR VTE: LMWH ID: Vanc/cefepime/Flagyl per ID   - below per TRH -  T2DM, uncontrolled - A1c 11.6, SSI Alcohol abuse Tobacco abuse Obesity class II  LOS: 10 days    Violeta Gelinas, MD, MPH, FACS Trauma & General Surgery Use [email or web address] to contact on call provider  07/03/2022

## 2022-07-03 NOTE — Progress Notes (Signed)
PROGRESS NOTE  [name] JXB:[id number] DOB: October 07, 1985 DOA: 06/23/2022 PCP: Patient, [name]   LOS: 10 days   Brief Narrative / Interim history: 36 year old male with history of obesity, EtOH use comes into the hospital with left chest wall cellulitis and sepsis.  He reports initially there was a boil in the area that popped open, and following that the whole area started to get worse.  Subjective / 24h Interval events: Complains of left-sided chest pain where the wounds are.  Still swollen, he does not appreciate that he responded to furosemide yesterday as he did not feel like he urinated more than normal  Assesement and Plan: Principal Problem:   Cellulitis of chest wall Active Problems:   Hyperglycemia   Hyponatremia   Tobacco abuse   Alcohol abuse   Breast abscess   Hidradenitis suppurativa   Dental infection   Morbid obesity (HCC)   Type 2 diabetes mellitus with hyperglycemia (HCC)   Pyomyositis   Anxiety   Hidradenitis  Principal problem Sepsis due to chest wall cellulitis /abscess -ultrasound of the chest showed possible abscess.  IR was initially consulted and he is status post aspiration on 12/5.  Given persistent symptoms, general surgery was consulted and eventually was taken to the OR on 12/8 for I&D.   -Will go back to the OR today -ID consulted, appreciate input.  Antibiotics per ID -Microbiology reviewed.  MRSA nasal PCR negative.  Blood cultures negative at 5 days, final.  IR aspirate 12/5 showing beta-lactamase positive Prevotella.  Surgical cultures 12/8 showing few gram-positive cocci, gram-positive rods.  Continue to closely monitor -Afebrile, white count improving  Active problems DM2, poorly controlled, with hyperglycemia -new diagnosis.  Currently on 7030 mix as well as sliding scale.  CBGs with fair control on this regimen.  Consult diabetes coordinator.  Discussed with patient a little bit more this morning, he is agreeable with home  insulin  Lab Results  Component Value Date   HGBA1C 11.6 (H) 06/24/2022   CBG (last 3)  Recent Labs    07/02/22 2158 07/03/22 0643 07/03/22 0852  GLUCAP 157* 165* 203*    Hyponatremia-still with fluid overload.  Sodium slightly better after yesterday's Lasix  Mild LFT elevation-likely in the setting of acute illness, trend  Microcytic anemia-no bleeding, monitor, obtain anemia panel in the morning  Alcohol and tobacco use-recommend cessation  Obesity, class II-recommend weight loss  Scheduled Meds:  [MAR Hold] enoxaparin (LOVENOX) injection  55 mg Subcutaneous Q24H   [MAR Hold] feeding supplement  237 mL Oral BID WC   [MAR Hold] folic acid  1 mg Oral Daily   [MAR Hold] insulin aspart  0-15 Units Subcutaneous TID WC   [MAR Hold] insulin aspart  0-5 Units Subcutaneous QHS   [MAR Hold] insulin aspart protamine- aspart  16 Units Subcutaneous BID WC   [MAR Hold] metroNIDAZOLE  500 mg Oral Q12H   [MAR Hold] multivitamin with minerals  1 tablet Oral Daily   [MAR Hold] nicotine  14 mg Transdermal Daily   [MAR Hold] nutrition supplement (JUVEN)  1 packet Oral BID BM   [MAR Hold] polyethylene glycol  17 g Oral Daily   [MAR Hold] potassium chloride  40 mEq Oral Once   [MAR Hold] thiamine  100 mg Oral Daily   Continuous Infusions:  acetaminophen     [MAR Hold] ceFEPime (MAXIPIME) IV 2 g (07/03/22 0657)   lactated ringers 10 mL/hr at 07/03/22 0859   [MAR Hold] vancomycin 1,250 mg (07/03/22  0008)   PRN Meds:.0.9 % irrigation (POUR BTL), acetaminophen, acetaminophen **OR** acetaminophen (TYLENOL) oral liquid 160 mg/5 mL, [MAR Hold] acetaminophen **OR** [DISCONTINUED] acetaminophen, amisulpride, [MAR Hold] clonazePAM, fentaNYL (SUBLIMAZE) injection, [MAR Hold]  HYDROmorphone (DILAUDID) injection, [MAR Hold] ibuprofen, insulin aspart, [MAR Hold] metoprolol tartrate, [MAR Hold]  morphine injection, [MAR Hold] ondansetron **OR** [MAR Hold] ondansetron (ZOFRAN) IV, [MAR Hold] mouth rinse,  oxyCODONE **OR** oxyCODONE, [MAR Hold] oxyCODONE, promethazine  Current Outpatient Medications  Medication Instructions   ibuprofen (ADVIL) 200 mg, Oral, Every 6 hours PRN   naproxen (NAPROSYN) 500 mg, Oral, 2 times daily    Diet Orders (From admission, onward)     Start     Ordered   07/03/22 0001  Diet NPO time specified Except for: Sips with Meds  Diet effective midnight       Question:  Except for  Answer:  Sips with Meds   07/02/22 1117            DVT prophylaxis: Enoxaparin   Lab Results  Component Value Date   PLT 683 (H) 07/03/2022      Code Status: Full Code  Family Communication: No family at bedside  Status is: Inpatient  Remains inpatient appropriate because: Severity of illness  Level of care: Progressive  Consultants:  ID General surgery IR  Objective: Vitals:   07/03/22 0316 07/03/22 0418 07/03/22 0804 07/03/22 0828  BP: 135/80 (!) 151/82 124/80   Pulse: (!) 101     Resp: 17 17 18    Temp:  98.6 F (37 C) 99.8 F (37.7 C)   TempSrc:   Oral   SpO2:   96%   Weight:    116.9 kg  Height:    5' 9.02" (1.753 m)    Intake/Output Summary (Last 24 hours) at 07/03/2022 1008 Last data filed at 07/03/2022 0400 Gross per 24 hour  Intake 1274.71 ml  Output 900 ml  Net 374.71 ml    Wt Readings from Last 3 Encounters:  07/03/22 116.9 kg  09/16/16 124.7 kg  08/06/16 120.2 kg    Examination:  Constitutional: NAD Eyes: lids and conjunctivae normal, no scleral icterus ENMT: mmm Neck: normal, supple Respiratory: clear to auscultation bilaterally, no wheezing, no crackles. Normal respiratory effort.  Cardiovascular: Regular rate and rhythm, no murmurs / rubs / gallops.  2+ pitting edema Abdomen: soft, no distention, no tenderness. Bowel sounds positive.  Skin: no rashes Neurologic: no focal deficits, equal strength  Data Reviewed: I have independently reviewed following labs and imaging studies   CBC Recent Labs  Lab 06/27/22 0020  06/28/22 0023 06/29/22 0453 07/02/22 0652 07/03/22 0019  WBC 20.3* 22.7* 23.3* 32.1* 27.9*  HGB 11.3* 11.4* 10.4* 11.4* 10.8*  HCT 30.7* 31.4* 28.3* 32.2* 29.4*  PLT 318 435* 437* 641* 683*  MCV 75.2* 75.8* 75.7* 77.0* 75.6*  MCH 27.7 27.5 27.8 27.3 27.8  MCHC 36.8* 36.3* 36.7* 35.4 36.7*  RDW 13.1 13.2 13.1 13.2 13.3  LYMPHSABS  --   --   --  2.0  --   MONOABS  --   --   --  0.7  --   EOSABS  --   --   --  0.1  --   BASOSABS  --   --   --  0.1  --      Recent Labs  Lab 06/29/22 0453 06/30/22 0859 07/01/22 1153 07/02/22 0652 07/03/22 0019  NA 129* 133* 133* 133* 135  K 3.5 3.3* 4.1 3.6 3.4*  CL 100  100 101 97* 101  CO2 21* 22 22 23 24   GLUCOSE 191* 131* 134* 197* 201*  BUN 10 9 7 10 12   CREATININE 0.88 0.76 0.84 0.69 0.78  CALCIUM 7.8* 8.2* 8.1* 8.5* 8.5*  AST  --   --   --   --  52*  ALT  --   --   --   --  46*  ALKPHOS  --   --   --   --  151*  BILITOT  --   --   --   --  1.0  ALBUMIN  --   --   --   --  1.6*  MG  --   --   --   --  2.2     ------------------------------------------------------------------------------------------------------------------ No results for input(s): "CHOL", "HDL", "LDLCALC", "TRIG", "CHOLHDL", "LDLDIRECT" in the last 72 hours.  Lab Results  Component Value Date   HGBA1C 11.6 (H) 06/24/2022   ------------------------------------------------------------------------------------------------------------------ No results for input(s): "TSH", "T4TOTAL", "T3FREE", "THYROIDAB" in the last 72 hours.  Invalid input(s): "FREET3"  Cardiac Enzymes No results for input(s): "CKMB", "TROPONINI", "MYOGLOBIN" in the last 168 hours.  Invalid input(s): "CK" ------------------------------------------------------------------------------------------------------------------ No results found for: "BNP"  CBG: Recent Labs  Lab 07/02/22 1142 07/02/22 1635 07/02/22 2158 07/03/22 0643 07/03/22 0852  GLUCAP 177* 152* 157* 165* 203*     Recent  Results (from the past 240 hour(s))  Blood culture (routine x 2)     Status: None   Collection Time: 06/23/22  1:40 PM   Specimen: BLOOD RIGHT ARM  Result Value Ref Range Status   Specimen Description BLOOD RIGHT ARM  Final   Special Requests   Final    BOTTLES DRAWN AEROBIC AND ANAEROBIC Blood Culture results may not be optimal due to an excessive volume of blood received in culture bottles   Culture   Final    NO GROWTH 5 DAYS Performed at [facility], [address], [place], [address] [place]    Report Status 06/28/2022 FINAL  Final  Blood culture (routine x 2)     Status: None   Collection Time: 06/23/22  4:10 PM   Specimen: BLOOD  Result Value Ref Range Status   Specimen Description BLOOD SITE NOT SPECIFIED  Final   Special Requests   Final    BOTTLES DRAWN AEROBIC AND ANAEROBIC Blood Culture results may not be optimal due to an excessive volume of blood received in culture bottles   Culture   Final    NO GROWTH 5 DAYS Performed at [facility], [address] [place], [address] [place]    Report Status 06/28/2022 FINAL  Final  SARS Coronavirus 2 by RT PCR (hospital order, performed in [facility] hospital lab) *cepheid single result test* Anterior Nasal Swab     Status: None   Collection Time: 06/25/22  2:26 PM   Specimen: Anterior Nasal Swab  Result Value Ref Range Status   SARS Coronavirus 2 by RT PCR NEGATIVE NEGATIVE Final    Comment: (NOTE) SARS-CoV-2 target nucleic acids are NOT DETECTED.  The SARS-CoV-2 RNA is generally detectable in upper and lower respiratory specimens during the acute phase of infection. The lowest concentration of SARS-CoV-2 viral copies this assay can detect is 250 copies / mL. A negative result does not preclude SARS-CoV-2 infection and should not be used as the sole basis for treatment or other patient management decisions.  A negative result may occur with improper specimen collection /  handling, submission of specimen  other than nasopharyngeal swab, presence of viral mutation(s) within the areas targeted by this assay, and inadequate number of viral copies (<250 copies / mL). A negative result must be combined with clinical observations, patient history, and epidemiological information.  Fact Sheet for Patients:   RoadLapTop.co.zahttps://www.fda.gov/media/158405/download  Fact Sheet for Healthcare Providers: http://kim-miller.com/https://www.fda.gov/media/158404/download  This test is not yet approved or  cleared by the [place] FDA and has been authorized for detection and/or diagnosis of SARS-CoV-2 by FDA under an Emergency Use Authorization (EUA).  This EUA will remain in effect (meaning this test can be used) for the duration of the COVID-19 declaration under Section 564(b)(1) of the Act, 21 U.S.C. section 360bbb-3(b)(1), unless the authorization is terminated or revoked sooner.  Performed at [facility], [address] [place], [place] [address]   Aerobic/Anaerobic Culture w Gram Stain (surgical/deep wound)     Status: None (Preliminary result)   Collection Time: 06/28/22 11:53 AM   Specimen: Abscess  Result Value Ref Range Status   Specimen Description ABSCESS  Final   Special Requests LEFT BREAST  Final   Gram Stain   Final    ABUNDANT WBC PRESENT,BOTH PMN AND MONONUCLEAR ABUNDANT GRAM NEGATIVE RODS    Culture   Final    MODERATE PREVOTELLA SPECIES BETA LACTAMASE POSITIVE CULTURE REINCUBATED FOR BETTER GROWTH Performed at [facility], [address] [place], [place] [address]    Report Status PENDING  Incomplete  Aerobic/Anaerobic Culture w Gram Stain (surgical/deep wound)     Status: None (Preliminary result)   Collection Time: 07/01/22  9:48 AM   Specimen: PATH Breast other; Tissue  Result Value Ref Range Status   Specimen Description WOUND  Final   Special Requests ABSCESS LEFT BREAST  Final   Gram Stain   Final    FEW GRAM POSITIVE COCCI RARE GRAM POSITIVE RODS RARE WBC PRESENT,  PREDOMINANTLY PMN    Culture   Final    NO GROWTH 1 DAY Performed at [facility], [address] [place], [place] [address]    Report Status PENDING  Incomplete     Radiology Studies: US Abdomen Limited  Result Date: 07/02/2022 CLINICAL DATA:  Flank mass EXAM: ULTRASOUND ABDOMEN LIMITED COMPARISON:  LEFT axillary ultrasound, 06/26/2022 FINDINGS: Focused ultrasound at the area of palpable concern, along the LEFT breast/axilla. Indurated subcutaneous tissues . No adenopathy or abnormal fluid collection within the imaged area. IMPRESSION: Subcutaneous edema. No adenopathy or abnormal fluid collection within the imaged area of the LEFT breast/axilla. Electronically Signed   By: Roanna BanningJon  Mugweru M.D.   On: 07/02/2022 15:50     Pamella Pertostin Shanya Ferriss, MD, PhD [facility]  Between 7 am - 7 pm I am available, please contact me via Amion (for emergencies) or Securechat (non urgent messages)  Between 7 pm - 7 am I am not available, please contact night coverage MD/APP via Amion

## 2022-07-03 NOTE — Anesthesia Postprocedure Evaluation (Signed)
Anesthesia Post Note  Patient: [name]  Procedure(s) Performed: IRRIGATION AND DEBRIDEMENT CHEST WALL / Breast ABSCESS (Left: Chest)     Patient location during evaluation: PACU Anesthesia Type: General Level of consciousness: awake and alert Pain management: pain level controlled Vital Signs Assessment: post-procedure vital signs reviewed and stable Respiratory status: spontaneous breathing, nonlabored ventilation, respiratory function stable and patient connected to nasal cannula oxygen Cardiovascular status: blood pressure returned to baseline and stable Postop Assessment: no apparent nausea or vomiting Anesthetic complications: no   No notable events documented.  Last Vitals:  Vitals:   07/03/22 1045 07/03/22 1100  BP: (!) 148/94 (!) 163/90  Pulse: 100 97  Resp: (!) 7 (!) 0  Temp:  36.7 C  SpO2: 91% 94%    Last Pain:  Vitals:   07/03/22 1100  TempSrc:   PainSc: 0-No pain                 Shelton Silvas

## 2022-07-04 ENCOUNTER — Inpatient Hospital Stay: Payer: Self-pay

## 2022-07-04 ENCOUNTER — Encounter (HOSPITAL_COMMUNITY): Payer: Self-pay | Admitting: General Surgery

## 2022-07-04 DIAGNOSIS — L732 Hidradenitis suppurativa: Secondary | ICD-10-CM | POA: Diagnosis not present

## 2022-07-04 DIAGNOSIS — L03313 Cellulitis of chest wall: Secondary | ICD-10-CM | POA: Diagnosis not present

## 2022-07-04 DIAGNOSIS — E8770 Fluid overload, unspecified: Secondary | ICD-10-CM

## 2022-07-04 DIAGNOSIS — N611 Abscess of the breast and nipple: Secondary | ICD-10-CM | POA: Diagnosis not present

## 2022-07-04 LAB — AEROBIC/ANAEROBIC CULTURE W GRAM STAIN (SURGICAL/DEEP WOUND)

## 2022-07-04 LAB — COMPREHENSIVE METABOLIC PANEL
ALT: 37 U/L (ref 0–44)
AST: 36 U/L (ref 15–41)
Albumin: 1.6 g/dL — ABNORMAL LOW (ref 3.5–5.0)
Alkaline Phosphatase: 174 U/L — ABNORMAL HIGH (ref 38–126)
Anion gap: 11 (ref 5–15)
BUN: 10 mg/dL (ref 6–20)
CO2: 27 mmol/L (ref 22–32)
Calcium: 8.3 mg/dL — ABNORMAL LOW (ref 8.9–10.3)
Chloride: 97 mmol/L — ABNORMAL LOW (ref 98–111)
Creatinine, Ser: 0.78 mg/dL (ref 0.61–1.24)
GFR, Estimated: >60 mL/min (ref >60)
Glucose, Bld: 163 mg/dL — ABNORMAL HIGH (ref 70–99)
Potassium: 4 mmol/L (ref 3.5–5.1)
Sodium: 135 mmol/L (ref 135–145)
Total Bilirubin: 0.8 mg/dL (ref 0.3–1.2)
Total Protein: 7.7 g/dL (ref 6.5–8.1)

## 2022-07-04 LAB — CBC
HCT: 24 % — ABNORMAL LOW (ref 39.0–52.0)
HCT: 27.4 % — ABNORMAL LOW (ref 39.0–52.0)
Hemoglobin: 8.6 g/dL — ABNORMAL LOW (ref 13.0–17.0)
Hemoglobin: 9.4 g/dL — ABNORMAL LOW (ref 13.0–17.0)
MCH: 27.7 pg (ref 26.0–34.0)
MCH: 27.2 pg (ref 26.0–34.0)
MCHC: 35.8 g/dL (ref 30.0–36.0)
MCHC: 34.3 g/dL (ref 30.0–36.0)
MCV: 77.2 fL — ABNORMAL LOW (ref 80.0–100.0)
MCV: 79.2 fL — ABNORMAL LOW (ref 80.0–100.0)
Platelets: 659 K/uL — ABNORMAL HIGH (ref 150–400)
Platelets: 765 10*3/uL — ABNORMAL HIGH (ref 150–400)
RBC: 3.11 MIL/uL — ABNORMAL LOW (ref 4.22–5.81)
RBC: 3.46 MIL/uL — ABNORMAL LOW (ref 4.22–5.81)
RDW: 13.7 % (ref 11.5–15.5)
RDW: 13.5 % (ref 11.5–15.5)
WBC: 17.9 K/uL — ABNORMAL HIGH (ref 4.0–10.5)
WBC: 19.9 10*3/uL — ABNORMAL HIGH (ref 4.0–10.5)
nRBC: 0 % (ref 0.0–0.2)
nRBC: 0 % (ref 0.0–0.2)

## 2022-07-04 LAB — COMPREHENSIVE METABOLIC PANEL WITH GFR
ALT: 36 U/L (ref 0–44)
AST: 38 U/L (ref 15–41)
Albumin: <1.5 g/dL — ABNORMAL LOW (ref 3.5–5.0)
Alkaline Phosphatase: 148 U/L — ABNORMAL HIGH (ref 38–126)
Anion gap: 9 (ref 5–15)
BUN: 6 mg/dL (ref 6–20)
CO2: 26 mmol/L (ref 22–32)
Calcium: 8 mg/dL — ABNORMAL LOW (ref 8.9–10.3)
Chloride: 101 mmol/L (ref 98–111)
Creatinine, Ser: 0.73 mg/dL (ref 0.61–1.24)
GFR, Estimated: >60 mL/min (ref >60)
Glucose, Bld: 137 mg/dL — ABNORMAL HIGH (ref 70–99)
Potassium: 3.7 mmol/L (ref 3.5–5.1)
Sodium: 136 mmol/L (ref 135–145)
Total Bilirubin: 0.6 mg/dL (ref 0.3–1.2)
Total Protein: 6.9 g/dL (ref 6.5–8.1)

## 2022-07-04 LAB — GLUCOSE, CAPILLARY
Glucose-Capillary: 139 mg/dL — ABNORMAL HIGH (ref 70–99)
Glucose-Capillary: 144 mg/dL — ABNORMAL HIGH (ref 70–99)
Glucose-Capillary: 162 mg/dL — ABNORMAL HIGH (ref 70–99)
Glucose-Capillary: 156 mg/dL — ABNORMAL HIGH (ref 70–99)

## 2022-07-04 LAB — MAGNESIUM: Magnesium: 1.9 mg/dL (ref 1.7–2.4)

## 2022-07-04 LAB — SURGICAL PATHOLOGY

## 2022-07-04 MED ORDER — SODIUM CHLORIDE 0.9% FLUSH
10.0000 mL | Freq: Two times a day (BID) | INTRAVENOUS | Status: DC
  Administered 2022-07-04 – 2022-07-13 (×17): 10 mL

## 2022-07-04 MED ORDER — SODIUM CHLORIDE 0.9% FLUSH: 10.0000 mL | INTRAVENOUS | Status: DC | PRN

## 2022-07-04 MED ORDER — INSULIN ASPART PROT & ASPART (70-30 MIX) 100 UNIT/ML ~~LOC~~ SUSP
12.0000 [IU] | Freq: Two times a day (BID) | SUBCUTANEOUS | Status: DC
  Administered 2022-07-04 – 2022-07-07 (×6): 12 [IU] via SUBCUTANEOUS
  Filled 2022-07-04: qty 10

## 2022-07-04 MED ORDER — CHLORHEXIDINE GLUCONATE CLOTH 2 % EX PADS
6.0000 | MEDICATED_PAD | Freq: Every day | CUTANEOUS | Status: DC
  Administered 2022-07-04 – 2022-07-13 (×10): 6 via TOPICAL

## 2022-07-04 MED ORDER — PIPERACILLIN-TAZOBACTAM 3.375 G IVPB
3.3750 g | Freq: Three times a day (TID) | INTRAVENOUS | Status: DC
  Administered 2022-07-04 – 2022-07-05 (×3): 3.375 g via INTRAVENOUS
  Filled 2022-07-04 (×5): qty 50

## 2022-07-04 NOTE — Progress Notes (Signed)
Peripherally Inserted Central Catheter Placement  The IV Nurse has discussed with the patient and/or persons authorized to consent for the patient, the purpose of this procedure and the potential benefits and risks involved with this procedure.  The benefits include less needle sticks, lab draws from the catheter, and the patient may be discharged home with the catheter. Risks include, but not limited to, infection, bleeding, blood clot (thrombus formation), and puncture of an artery; nerve damage and irregular heartbeat and possibility to perform a PICC exchange if needed/ordered by physician.  Alternatives to this procedure were also discussed.  Bard Power PICC patient education guide, fact sheet on infection prevention and patient information card has been provided to patient /or left at bedside.    PICC Placement Documentation  PICC Single Lumen 07/03/22 Right Brachial 39 cm 0 cm (Active)  Indication for Insertion or Continuance of Line Poor Vasculature-patient has had multiple peripheral attempts or PIVs lasting less than 24 hours 07/04/22 1058  Exposed Catheter (cm) 0 cm 07/04/22 1058  Site Assessment Clean, Dry, Intact 07/04/22 1058  Line Status Flushed;Blood return noted;Saline locked 07/04/22 1058  Dressing Type Transparent 07/04/22 1058  Dressing Status Antimicrobial disc in place 07/04/22 1058  Line Care Connections checked and tightened 07/04/22 1058  Dressing Change Due 07/11/22 07/04/22 1058       [name] 07/04/2022, 11:00 AM

## 2022-07-04 NOTE — Progress Notes (Signed)
Mobility Specialist Progress Note    07/04/22 1621  Mobility  Activity Ambulated independently to bathroom  Level of Assistance Standby assist, set-up cues, supervision of patient - no hands on  Assistive Device None  Distance Ambulated (ft) 20 ft (10+10)  Activity Response Tolerated well  Mobility Referral Yes  $Mobility charge 1 Mobility   Pt had BM. Returned to standing EOB w/ RN present.   [place] Mobility Specialist  Please Neurosurgeon or Rehab Office at [phone]

## 2022-07-04 NOTE — Progress Notes (Signed)
PROGRESS NOTE  [name] CWC:[id number] DOB: 03/11/1986 DOA: 06/23/2022 PCP: Patient, [name]   LOS: 11 days   Brief Narrative / Interim history: 36 year old male with history of obesity, EtOH use comes into the hospital with left chest wall cellulitis and sepsis.  He reports initially there was a boil in the area that popped open, and following that the whole area started to get worse.  Subjective / 24h Interval events: Has pain at the surgical site this morning, but states that it is controlled with pain medications.  He is still swollen  Assesement and Plan: Principal Problem:   Cellulitis of chest wall Active Problems:   Hyperglycemia   Hyponatremia   Tobacco abuse   Alcohol abuse   Breast abscess   Hidradenitis suppurativa   Dental infection   Morbid obesity (HCC)   Type 2 diabetes mellitus with hyperglycemia (HCC)   Pyomyositis   Anxiety   Hidradenitis  Principal problem Sepsis due to chest wall cellulitis /abscess -ultrasound of the chest showed possible abscess.  IR was initially consulted and he is status post aspiration on 12/5.  Given persistent symptoms, general surgery was consulted and eventually was taken to the OR on 12/8 for I&D, and underwent repeat debridement 4/10.   -ID consulted, appreciate input.  Antibiotics per ID -Microbiology reviewed today.  MRSA nasal PCR negative.  Blood cultures negative at 5 days, final.  IR aspirate 12/5 showing beta-lactamase positive Prevotella.  Surgical cultures 12/8 showing few gram-positive cocci, gram-positive rods.  Continue to monitor daily -Tmax 100.4, white count pending this morning PICC line placement  Active problems DM2, poorly controlled, with hyperglycemia -new diagnosis.  Currently on 7030 mix as well as sliding scale.  Consult diabetes coordinator.  Discussed with patient a little bit more this morning, he is agreeable with home insulin.  CBGs acceptable as below  Lab Results  Component Value Date    HGBA1C 11.6 (H) 06/24/2022   CBG (last 3)  Recent Labs    07/03/22 1612 07/03/22 2159 07/04/22 0611  GLUCAP 203* 141* 139*    Hyponatremia-still with fluid overload.  Labs this morning pending, once resulted will need Lasix again due to significant fluid overload  Mild LFT elevation-likely in the setting of acute illness, trend  Microcytic anemia-no bleeding, monitor, obtain anemia panel in the morning  Alcohol and tobacco use-recommend cessation  Obesity, class II-recommend weight loss  Scheduled Meds:  enoxaparin (LOVENOX) injection  55 mg Subcutaneous Q24H   feeding supplement  237 mL Oral BID WC   folic acid  1 mg Oral Daily   insulin aspart  0-15 Units Subcutaneous TID WC   insulin aspart  0-5 Units Subcutaneous QHS   insulin aspart protamine- aspart  16 Units Subcutaneous BID WC   multivitamin with minerals  1 tablet Oral Daily   nicotine  14 mg Transdermal Daily   nutrition supplement (JUVEN)  1 packet Oral BID BM   polyethylene glycol  17 g Oral Daily   thiamine  100 mg Oral Daily   Continuous Infusions:  DAPTOmycin (CUBICIN) 700 mg in sodium chloride 0.9 % IVPB Stopped (07/03/22 1743)   piperacillin-tazobactam (ZOSYN)  IV     PRN Meds:.acetaminophen **OR** [DISCONTINUED] acetaminophen, clonazePAM, HYDROmorphone (DILAUDID) injection, ibuprofen, metoprolol tartrate, morphine injection, ondansetron **OR** ondansetron (ZOFRAN) IV, mouth rinse, oxyCODONE  Current Outpatient Medications  Medication Instructions   ibuprofen (ADVIL) 200 mg, Oral, Every 6 hours PRN   naproxen (NAPROSYN) 500 mg, Oral, 2 times daily  Diet Orders (From admission, onward)     Start     Ordered   07/03/22 1133  Diet Carb Modified Fluid consistency: Thin; Room service appropriate? Yes  Diet effective now       Question Answer Comment  Diet-HS Snack? Nothing   Calorie Level Medium 1600-2000   Fluid consistency: Thin   Room service appropriate? Yes      07/03/22 1132             DVT prophylaxis: Enoxaparin   Lab Results  Component Value Date   PLT 683 (H) 07/03/2022      Code Status: Full Code  Family Communication: No family at bedside  Status is: Inpatient  Remains inpatient appropriate because: Severity of illness  Level of care: Med-Surg  Consultants:  ID General surgery IR  Objective: Vitals:   07/04/22 0419 07/04/22 0535 07/04/22 0557 07/04/22 0800  BP: 123/63   122/70  Pulse:    (!) 105  Resp: 17 18 15 20   Temp: 100.1 F (37.8 C)   99.8 F (37.7 C)  TempSrc: Oral   Oral  SpO2:      Weight:      Height:        Intake/Output Summary (Last 24 hours) at 07/04/2022 1035 Last data filed at 07/04/2022 0450 Gross per 24 hour  Intake 779.38 ml  Output --  Net 779.38 ml    Wt Readings from Last 3 Encounters:  07/03/22 116.9 kg  09/16/16 124.7 kg  08/06/16 120.2 kg    Examination:  Constitutional: NAD Eyes: lids and conjunctivae normal, no scleral icterus ENMT: mmm Neck: normal, supple Respiratory: clear to auscultation bilaterally, no wheezing, no crackles. Normal respiratory effort.  Cardiovascular: Regular rate and rhythm, no murmurs / rubs / gallops.  2+ pitting edema Abdomen: soft, no distention, no tenderness. Bowel sounds positive.  Skin: no rashes Neurologic: no focal deficits, equal strength  Data Reviewed: I have independently reviewed following labs and imaging studies   CBC Recent Labs  Lab 06/28/22 0023 06/29/22 0453 07/02/22 0652 07/03/22 0019  WBC 22.7* 23.3* 32.1* 27.9*  HGB 11.4* 10.4* 11.4* 10.8*  HCT 31.4* 28.3* 32.2* 29.4*  PLT 435* 437* 641* 683*  MCV 75.8* 75.7* 77.0* 75.6*  MCH 27.5 27.8 27.3 27.8  MCHC 36.3* 36.7* 35.4 36.7*  RDW 13.2 13.1 13.2 13.3  LYMPHSABS  --   --  2.0  --   MONOABS  --   --  0.7  --   EOSABS  --   --  0.1  --   BASOSABS  --   --  0.1  --      Recent Labs  Lab 06/29/22 0453 06/30/22 0859 07/01/22 1153 07/02/22 0652 07/03/22 0019  NA 129* 133* 133*  133* 135  K 3.5 3.3* 4.1 3.6 3.4*  CL 100 100 101 97* 101  CO2 21* 22 22 23 24   GLUCOSE 191* 131* 134* 197* 201*  BUN 10 9 7 10 12   CREATININE 0.88 0.76 0.84 0.69 0.78  CALCIUM 7.8* 8.2* 8.1* 8.5* 8.5*  AST  --   --   --   --  52*  ALT  --   --   --   --  46*  ALKPHOS  --   --   --   --  151*  BILITOT  --   --   --   --  1.0  ALBUMIN  --   --   --   --  1.6*  MG  --   --   --   --  2.2     ------------------------------------------------------------------------------------------------------------------ No results for input(s): "CHOL", "HDL", "LDLCALC", "TRIG", "CHOLHDL", "LDLDIRECT" in the last 72 hours.  Lab Results  Component Value Date   HGBA1C 11.6 (H) 06/24/2022   ------------------------------------------------------------------------------------------------------------------ No results for input(s): "TSH", "T4TOTAL", "T3FREE", "THYROIDAB" in the last 72 hours.  Invalid input(s): "FREET3"  Cardiac Enzymes No results for input(s): "CKMB", "TROPONINI", "MYOGLOBIN" in the last 168 hours.  Invalid input(s): "CK" ------------------------------------------------------------------------------------------------------------------ No results found for: "BNP"  CBG: Recent Labs  Lab 07/03/22 1041 07/03/22 1145 07/03/22 1612 07/03/22 2159 07/04/22 0611  GLUCAP 176* 158* 203* 141* 139*     Recent Results (from the past 240 hour(s))  SARS Coronavirus 2 by RT PCR (hospital order, performed in [facility] hospital lab) *cepheid single result test* Anterior Nasal Swab     Status: None   Collection Time: 06/25/22  2:26 PM   Specimen: Anterior Nasal Swab  Result Value Ref Range Status   SARS Coronavirus 2 by RT PCR NEGATIVE NEGATIVE Final    Comment: (NOTE) SARS-CoV-2 target nucleic acids are NOT DETECTED.  The SARS-CoV-2 RNA is generally detectable in upper and lower respiratory specimens during the acute phase of infection. The lowest concentration of SARS-CoV-2 viral  copies this assay can detect is 250 copies / mL. A negative result does not preclude SARS-CoV-2 infection and should not be used as the sole basis for treatment or other patient management decisions.  A negative result may occur with improper specimen collection / handling, submission of specimen other than nasopharyngeal swab, presence of viral mutation(s) within the areas targeted by this assay, and inadequate number of viral copies (<250 copies / mL). A negative result must be combined with clinical observations, patient history, and epidemiological information.  Fact Sheet for Patients:   RoadLapTop.co.za  Fact Sheet for Healthcare Providers: http://kim-miller.com/  This test is not yet approved or  cleared by the [place] FDA and has been authorized for detection and/or diagnosis of SARS-CoV-2 by FDA under an Emergency Use Authorization (EUA).  This EUA will remain in effect (meaning this test can be used) for the duration of the COVID-19 declaration under Section 564(b)(1) of the Act, 21 U.S.C. section 360bbb-3(b)(1), unless the authorization is terminated or revoked sooner.  Performed at [facility] Lab, [address] [place], [place] [address]   Aerobic/Anaerobic Culture w Gram Stain (surgical/deep wound)     Status: None (Preliminary result)   Collection Time: 06/28/22 11:53 AM   Specimen: Abscess  Result Value Ref Range Status   Specimen Description ABSCESS  Final   Special Requests LEFT BREAST  Final   Gram Stain   Final    ABUNDANT WBC PRESENT,BOTH PMN AND MONONUCLEAR ABUNDANT GRAM NEGATIVE RODS    Culture   Final    MODERATE PREVOTELLA SPECIES BETA LACTAMASE POSITIVE CULTURE REINCUBATED FOR BETTER GROWTH Performed at [facility] Lab, [address] [place], [place] [address]    Report Status PENDING  Incomplete  Aerobic/Anaerobic Culture w Gram Stain (surgical/deep wound)     Status: None (Preliminary  result)   Collection Time: 07/01/22  9:48 AM   Specimen: PATH Breast other; Tissue  Result Value Ref Range Status   Specimen Description WOUND  Final   Special Requests ABSCESS LEFT BREAST  Final   Gram Stain   Final    FEW GRAM POSITIVE COCCI RARE GRAM POSITIVE RODS RARE WBC PRESENT, PREDOMINANTLY  PMN    Culture   Final    NO GROWTH 3 DAYS NO ANAEROBES ISOLATED; CULTURE IN PROGRESS FOR 5 DAYS Performed at [facility], [address], [place], [place] [address]    Report Status PENDING  Incomplete     Radiology Studies: [place] EKG SITE RITE  Result Date: 07/04/2022 If Site Rite image not attached, placement could not be confirmed due to current cardiac rhythm.    Pamella Pert, MD, PhD [facility]  Between 7 am - 7 pm I am available, please contact me via Amion (for emergencies) or Securechat (non urgent messages)  Between 7 pm - 7 am I am not available, please contact night coverage MD/APP via Amion

## 2022-07-04 NOTE — Progress Notes (Signed)
Subjective:  No new complaints  Antibiotics:  Anti-infectives (From admission, onward)    Start     Dose/Rate Route Frequency Ordered Stop   07/04/22 1400  piperacillin-tazobactam (ZOSYN) IVPB 3.375 g        3.375 g 12.5 mL/hr over 240 Minutes Intravenous Every 8 hours 07/04/22 0957     07/03/22 1600  DAPTOmycin (CUBICIN) 700 mg in sodium chloride 0.9 % IVPB        8 mg/kg  89.2 kg (Adjusted) 128 mL/hr over 30 Minutes Intravenous Daily 07/03/22 1512     07/03/22 0000  vancomycin (VANCOREADY) IVPB 1250 mg/250 mL  Status:  Discontinued        1,250 mg 166.7 mL/hr over 90 Minutes Intravenous Every 12 hours 07/02/22 1119 07/03/22 1512   07/02/22 1215  vancomycin (VANCOREADY) IVPB 2000 mg/400 mL        2,000 mg 200 mL/hr over 120 Minutes Intravenous  Once 07/02/22 1119 07/03/22 0144   06/29/22 1200  ceFEPIme (MAXIPIME) 2 g in sodium chloride 0.9 % 100 mL IVPB  Status:  Discontinued        2 g 200 mL/hr over 30 Minutes Intravenous Every 8 hours 06/29/22 1005 07/04/22 0957   06/29/22 1100  metroNIDAZOLE (FLAGYL) tablet 500 mg  Status:  Discontinued        500 mg Oral Every 12 hours 06/29/22 1005 07/04/22 0957   06/28/22 1200  Ampicillin-Sulbactam (UNASYN) 3 g in sodium chloride 0.9 % 100 mL IVPB  Status:  Discontinued        3 g 200 mL/hr over 30 Minutes Intravenous Every 6 hours 06/28/22 0911 06/29/22 1005   06/24/22 0600  ceFEPIme (MAXIPIME) 2 g in sodium chloride 0.9 % 100 mL IVPB  Status:  Discontinued        2 g 200 mL/hr over 30 Minutes Intravenous Every 8 hours 06/23/22 2041 06/28/22 0911   06/24/22 0300  vancomycin (VANCOREADY) IVPB 1250 mg/250 mL  Status:  Discontinued        1,250 mg 166.7 mL/hr over 90 Minutes Intravenous Every 12 hours 06/23/22 1543 06/29/22 0819   06/23/22 2100  ceFEPIme (MAXIPIME) 2 g in sodium chloride 0.9 % 100 mL IVPB        2 g 200 mL/hr over 30 Minutes Intravenous  Once 06/23/22 2006 06/23/22 2153   06/23/22 2100  metroNIDAZOLE (FLAGYL)  IVPB 500 mg  Status:  Discontinued        500 mg 100 mL/hr over 60 Minutes Intravenous Every 12 hours 06/23/22 2006 06/28/22 0849   06/23/22 1600  vancomycin (VANCOREADY) IVPB 1500 mg/300 mL        1,500 mg 150 mL/hr over 120 Minutes Intravenous  Once 06/23/22 1514 06/23/22 1912   06/23/22 1430  vancomycin (VANCOCIN) IVPB 1000 mg/200 mL premix        1,000 mg 200 mL/hr over 60 Minutes Intravenous  Once 06/23/22 1418 06/23/22 1624   06/23/22 1430  cefTRIAXone (ROCEPHIN) 2 g in sodium chloride 0.9 % 100 mL IVPB        2 g 200 mL/hr over 30 Minutes Intravenous  Once 06/23/22 1418 06/23/22 1506       Medications: Scheduled Meds:  Chlorhexidine Gluconate Cloth  6 each Topical Daily   enoxaparin (LOVENOX) injection  55 mg Subcutaneous Q24H   feeding supplement  237 mL Oral BID WC   folic acid  1 mg Oral Daily   insulin aspart  0-15 Units  Subcutaneous TID WC   insulin aspart  0-5 Units Subcutaneous QHS   insulin aspart protamine- aspart  16 Units Subcutaneous BID WC   multivitamin with minerals  1 tablet Oral Daily   nicotine  14 mg Transdermal Daily   nutrition supplement (JUVEN)  1 packet Oral BID BM   polyethylene glycol  17 g Oral Daily   sodium chloride flush  10-40 mL Intracatheter Q12H   thiamine  100 mg Oral Daily   Continuous Infusions:  DAPTOmycin (CUBICIN) 700 mg in sodium chloride 0.9 % IVPB Stopped (07/03/22 1743)   piperacillin-tazobactam (ZOSYN)  IV 3.375 g (07/04/22 1328)   PRN Meds:.acetaminophen **OR** [DISCONTINUED] acetaminophen, clonazePAM, HYDROmorphone (DILAUDID) injection, ibuprofen, metoprolol tartrate, morphine injection, ondansetron **OR** ondansetron (ZOFRAN) IV, mouth rinse, oxyCODONE, sodium chloride flush    Objective: Weight change:   Intake/Output Summary (Last 24 hours) at 07/04/2022 1344 Last data filed at 07/04/2022 1145 Gross per 24 hour  Intake 789.38 ml  Output --  Net 789.38 ml    Blood pressure 123/81, pulse (!) 108, temperature  98.9 F (37.2 C), temperature source Oral, resp. rate 20, height 5' 9.02" (1.753 m), weight 116.9 kg, SpO2 92 %. Temp:  [98.9 F (37.2 C)-100.4 F (38 C)] 98.9 F (37.2 C) (12/11 1212) Pulse Rate:  [105-108] 108 (12/11 1212) Resp:  [12-20] 20 (12/11 0800) BP: (122-138)/(63-83) 123/81 (12/11 1212) SpO2:  [92 %] 92 % (12/10 1614)  Physical Exam: Physical Exam Constitutional:      Appearance: He is well-developed.  HENT:     Head: Normocephalic and atraumatic.  Eyes:     Conjunctiva/sclera: Conjunctivae normal.  Cardiovascular:     Rate and Rhythm: Normal rate and regular rhythm.  Pulmonary:     Effort: Pulmonary effort is normal. No respiratory distress.     Breath sounds: Normal breath sounds. No stridor. No wheezing.  Abdominal:     General: There is no distension.     Palpations: Abdomen is soft.  Musculoskeletal:        General: Normal range of motion.     Cervical back: Normal range of motion and neck supple.  Skin:    General: Skin is warm and dry.  Neurological:     General: No focal deficit present.     Mental Status: He is alert and oriented to person, place, and time.  Psychiatric:        Mood and Affect: Mood normal.        Behavior: Behavior normal.        Thought Content: Thought content normal.        Judgment: Judgment normal.   Chest bandaged CBC:    BMET Recent Labs    07/02/22 0652 07/03/22 0019  NA 133* 135  K 3.6 3.4*  CL 97* 101  CO2 23 24  GLUCOSE 197* 201*  BUN 10 12  CREATININE 0.69 0.78  CALCIUM 8.5* 8.5*      Liver Panel  Recent Labs    07/03/22 0019  PROT 7.8  ALBUMIN 1.6*  AST 52*  ALT 46*  ALKPHOS 151*  BILITOT 1.0       Sedimentation Rate No results for input(s): "ESRSEDRATE" in the last 72 hours. C-Reactive Protein No results for input(s): "CRP" in the last 72 hours.  Micro Results: Recent Results (from the past 720 hour(s))  MRSA Next Gen by PCR, Nasal     Status: None   Collection Time: 06/23/22  12:36 AM   Specimen: Nasal Mucosa; Nasal  Swab  Result Value Ref Range Status   MRSA by PCR Next Gen NOT DETECTED NOT DETECTED Final    Comment: (NOTE) The GeneXpert MRSA Assay (FDA approved for NASAL specimens only), is one component of a comprehensive MRSA colonization surveillance program. It is not intended to diagnose MRSA infection nor to guide or monitor treatment for MRSA infections. Test performance is not FDA approved in patients less than 33 years old. Performed at [facility], [address] [place], [place] [address]   Blood culture (routine x 2)     Status: None   Collection Time: 06/23/22  1:40 PM   Specimen: BLOOD RIGHT ARM  Result Value Ref Range Status   Specimen Description BLOOD RIGHT ARM  Final   Special Requests   Final    BOTTLES DRAWN AEROBIC AND ANAEROBIC Blood Culture results may not be optimal due to an excessive volume of blood received in culture bottles   Culture   Final    NO GROWTH 5 DAYS Performed at [facility], [address], [place], [place] [address]    Report Status 06/28/2022 FINAL  Final  Blood culture (routine x 2)     Status: None   Collection Time: 06/23/22  4:10 PM   Specimen: BLOOD  Result Value Ref Range Status   Specimen Description BLOOD SITE NOT SPECIFIED  Final   Special Requests   Final    BOTTLES DRAWN AEROBIC AND ANAEROBIC Blood Culture results may not be optimal due to an excessive volume of blood received in culture bottles   Culture   Final    NO GROWTH 5 DAYS Performed at [facility], [address] [place], [place] [address]    Report Status 06/28/2022 FINAL  Final  SARS Coronavirus 2 by RT PCR (hospital order, performed in [facility] hospital lab) *cepheid single result test* Anterior Nasal Swab     Status: None   Collection Time: 06/25/22  2:26 PM   Specimen: Anterior Nasal Swab  Result Value Ref Range Status   SARS Coronavirus 2 by RT PCR NEGATIVE NEGATIVE Final    Comment:  (NOTE) SARS-CoV-2 target nucleic acids are NOT DETECTED.  The SARS-CoV-2 RNA is generally detectable in upper and lower respiratory specimens during the acute phase of infection. The lowest concentration of SARS-CoV-2 viral copies this assay can detect is 250 copies / mL. A negative result does not preclude SARS-CoV-2 infection and should not be used as the sole basis for treatment or other patient management decisions.  A negative result may occur with improper specimen collection / handling, submission of specimen other than nasopharyngeal swab, presence of viral mutation(s) within the areas targeted by this assay, and inadequate number of viral copies (<250 copies / mL). A negative result must be combined with clinical observations, patient history, and epidemiological information.  Fact Sheet for Patients:   RoadLapTop.co.za  Fact Sheet for Healthcare Providers: http://kim-miller.com/  This test is not yet approved or  cleared by the [place] FDA and has been authorized for detection and/or diagnosis of SARS-CoV-2 by FDA under an Emergency Use Authorization (EUA).  This EUA will remain in effect (meaning this test can be used) for the duration of the COVID-19 declaration under Section 564(b)(1) of the Act, 21 U.S.C. section 360bbb-3(b)(1), unless the authorization is terminated or revoked sooner.  Performed at [facility], [address] [place], [place] [address]   Aerobic/Anaerobic Culture w Gram Stain (surgical/deep wound)  Status: None (Preliminary result)   Collection Time: 06/28/22 11:53 AM   Specimen: Abscess  Result Value Ref Range Status   Specimen Description ABSCESS  Final   Special Requests LEFT BREAST  Final   Gram Stain   Final    ABUNDANT WBC PRESENT,BOTH PMN AND MONONUCLEAR ABUNDANT GRAM NEGATIVE RODS    Culture   Final    MODERATE PREVOTELLA SPECIES BETA LACTAMASE POSITIVE CULTURE REINCUBATED  FOR BETTER GROWTH Performed at [facility], [address], [place], [place] [address]    Report Status PENDING  Incomplete  Aerobic/Anaerobic Culture w Gram Stain (surgical/deep wound)     Status: None (Preliminary result)   Collection Time: 07/01/22  9:48 AM   Specimen: PATH Breast other; Tissue  Result Value Ref Range Status   Specimen Description WOUND  Final   Special Requests ABSCESS LEFT BREAST  Final   Gram Stain   Final    FEW GRAM POSITIVE COCCI RARE GRAM POSITIVE RODS RARE WBC PRESENT, PREDOMINANTLY PMN    Culture   Final    NO GROWTH 3 DAYS NO ANAEROBES ISOLATED; CULTURE IN PROGRESS FOR 5 DAYS Performed at [facility], [address], [place], [place] [address]    Report Status PENDING  Incomplete    Studies/Results: [place] EKG SITE RITE  Result Date: 07/04/2022 If Site Rite image not attached, placement could not be confirmed due to current cardiac rhythm.  US Abdomen Limited  Result Date: 07/02/2022 CLINICAL DATA:  Flank mass EXAM: ULTRASOUND ABDOMEN LIMITED COMPARISON:  LEFT axillary ultrasound, 06/26/2022 FINDINGS: Focused ultrasound at the area of palpable concern, along the LEFT breast/axilla. Indurated subcutaneous tissues . No adenopathy or abnormal fluid collection within the imaged area. IMPRESSION: Subcutaneous edema. No adenopathy or abnormal fluid collection within the imaged area of the LEFT breast/axilla. Electronically Signed   By: Roanna Banning M.D.   On: 07/02/2022 15:50      Assessment/Plan:  INTERVAL HISTORY: Repeat cultures from the left breast is showing gram-positive cocci gram-positive rods with cultures incubating.  He now has left-sided flank tenderness and erythema concerning to me for potential more distal pyomyositis  Principal Problem:   Cellulitis of chest wall Active Problems:   Hyperglycemia   Hyponatremia   Tobacco abuse   Alcohol abuse   Breast abscess   Hidradenitis suppurativa   Dental infection   Morbid  obesity (HCC)   Type 2 diabetes mellitus with hyperglycemia (HCC)   Pyomyositis   Anxiety   Hidradenitis    GARNER DULLEA is a 36 y.o. male with morbid obesity newly diagnosed noticed diabetes mellitus likely retinitis suppurativa with recurrent infections in his soft tissues in particular axilla groin now admitted with eft soft tissue breast infection   #1  Left chest wall necrotizing soft tissue infection with abscess:  He has not been taken back to the operating room yesterday by Dr. Janee Morn underwent further debridement of left wall necrotizing tissue with 30 cm x 9 cm x 4 cm deep incision and drainage of left flank x 2  Cultures grew a lactamase producing Prevotella species where subsequent cultures on Gram stain showed gram-positive cocci gram-positive rods but no organisms on culture.  Issues with volume overload we will gym to daptomycin with Zosyn follow cultures.  Hopefully after he improves we can change him over to an oral regimen    2   Hidradenitis suppurativa: He will need connected to dermatology.  3.  Diabetes mellitus: Would benefit from ozempic  or simliar drug I would think  4.  Volume overload hopefully reducing the volume of IV antibiotics and getting will help.   I spent 52 minutes with the patient including than 50% of the time in face to face counseling of the patient guarding his necrotizing soft tissue infection of the chest,  along with review of medical records in preparation for the visit and during the visit and in coordination of his care.  .     LOS: 11 days   Acey Lav 07/04/2022, 1:44 PM

## 2022-07-04 NOTE — Progress Notes (Signed)
1 Day Post-Op  Subjective: CC: Pain over wound. Pain medications are helping. Seen with RN for dressing change.   Tmax 100.4. Tachycardic this am. No hypotension overnight or this am. On RA. Making urine. Labs pending this am. Cx's from 12/5 with Prevotella species, beta lactamase positive, reincubated for better growth w/ sensitives pending. Cx from 12/8 pending.   Objective: Vital signs in last 24 hours: Temp:  [97.8 F (36.6 C)-100.4 F (38 C)] 100.1 F (37.8 C) (12/11 0419) Pulse Rate:  [97-100] 97 (12/10 1100) Resp:  [0-18] 15 (12/11 0557) BP: (123-163)/(63-94) 123/63 (12/11 0419) SpO2:  [91 %-96 %] 92 % (12/10 1614) Weight:  [116.9 kg] 116.9 kg (12/10 0828) Last BM Date : 07/03/22  Intake/Output from previous day: 12/10 0701 - 12/11 0700 In: 779.4 [IV Piggyback:779.4] Out: -  Intake/Output this shift: No intake/output data recorded.  PE: Gen:  Alert, NAD, pleasant. Diaphoretic Ext: Left arm with some swelling. Palpable radial pulse. Good grip strength. Able to demonstrate movement of LUE. SILT. Wound: Left breast/chest wall. See pictures below. 30cm x 9cm x 4cm deep with mixed granulation tissue, fibrinous tissue and some cauterized tissue. Last picture demonstrate area that I was drainage purulent drainage and I was able to blunty open past some necrotic fat/fibrinous tissue and tracks ~4cm cephalad with decent amount of purulent drainage expressed. No other areas found that I could break up loculations. No other drainage from the wound. No other areas of significant tracking/tunneling. Induration and erythema along the left flank and lateral abdomen are improved from 12/9. Counter incisions x 2 clean without drainage.          Lab Results:  Recent Labs    07/02/22 0652 07/03/22 0019  WBC 32.1* 27.9*  HGB 11.4* 10.8*  HCT 32.2* 29.4*  PLT 641* 683*   BMET Recent Labs    07/02/22 0652 07/03/22 0019  NA 133* 135  K 3.6 3.4*  CL 97* 101  CO2 23 24   GLUCOSE 197* 201*  BUN 10 12  CREATININE 0.69 0.78  CALCIUM 8.5* 8.5*   PT/INR No results for input(s): "LABPROT", "INR" in the last 72 hours. CMP     Component Value Date/Time   NA 135 07/03/2022 0019   K 3.4 (L) 07/03/2022 0019   CL 101 07/03/2022 0019   CO2 24 07/03/2022 0019   GLUCOSE 201 (H) 07/03/2022 0019   BUN 12 07/03/2022 0019   CREATININE 0.78 07/03/2022 0019   CALCIUM 8.5 (L) 07/03/2022 0019   PROT 7.8 07/03/2022 0019   ALBUMIN 1.6 (L) 07/03/2022 0019   AST 52 (H) 07/03/2022 0019   ALT 46 (H) 07/03/2022 0019   ALKPHOS 151 (H) 07/03/2022 0019   BILITOT 1.0 07/03/2022 0019   GFRNONAA >60 07/03/2022 0019   Lipase  No results found for: "LIPASE"  Studies/Results: US Abdomen Limited  Result Date: 07/02/2022 CLINICAL DATA:  Flank mass EXAM: ULTRASOUND ABDOMEN LIMITED COMPARISON:  LEFT axillary ultrasound, 06/26/2022 FINDINGS: Focused ultrasound at the area of palpable concern, along the LEFT breast/axilla. Indurated subcutaneous tissues . No adenopathy or abnormal fluid collection within the imaged area. IMPRESSION: Subcutaneous edema. No adenopathy or abnormal fluid collection within the imaged area of the LEFT breast/axilla. Electronically Signed   By: Roanna Banning M.D.   On: 07/02/2022 15:50    Anti-infectives: Anti-infectives (From admission, onward)    Start     Dose/Rate Route Frequency Ordered Stop   07/03/22 1600  DAPTOmycin (CUBICIN) 700 mg in sodium  chloride 0.9 % IVPB        8 mg/kg  89.2 kg (Adjusted) 128 mL/hr over 30 Minutes Intravenous Daily 07/03/22 1512     07/03/22 0000  vancomycin (VANCOREADY) IVPB 1250 mg/250 mL  Status:  Discontinued        1,250 mg 166.7 mL/hr over 90 Minutes Intravenous Every 12 hours 07/02/22 1119 07/03/22 1512   07/02/22 1215  vancomycin (VANCOREADY) IVPB 2000 mg/400 mL        2,000 mg 200 mL/hr over 120 Minutes Intravenous  Once 07/02/22 1119 07/03/22 0144   06/29/22 1200  ceFEPIme (MAXIPIME) 2 g in sodium chloride 0.9  % 100 mL IVPB        2 g 200 mL/hr over 30 Minutes Intravenous Every 8 hours 06/29/22 1005     06/29/22 1100  metroNIDAZOLE (FLAGYL) tablet 500 mg        500 mg Oral Every 12 hours 06/29/22 1005     06/28/22 1200  Ampicillin-Sulbactam (UNASYN) 3 g in sodium chloride 0.9 % 100 mL IVPB  Status:  Discontinued        3 g 200 mL/hr over 30 Minutes Intravenous Every 6 hours 06/28/22 0911 06/29/22 1005   06/24/22 0600  ceFEPIme (MAXIPIME) 2 g in sodium chloride 0.9 % 100 mL IVPB  Status:  Discontinued        2 g 200 mL/hr over 30 Minutes Intravenous Every 8 hours 06/23/22 2041 06/28/22 0911   06/24/22 0300  vancomycin (VANCOREADY) IVPB 1250 mg/250 mL  Status:  Discontinued        1,250 mg 166.7 mL/hr over 90 Minutes Intravenous Every 12 hours 06/23/22 1543 06/29/22 0819   06/23/22 2100  ceFEPIme (MAXIPIME) 2 g in sodium chloride 0.9 % 100 mL IVPB        2 g 200 mL/hr over 30 Minutes Intravenous  Once 06/23/22 2006 06/23/22 2153   06/23/22 2100  metroNIDAZOLE (FLAGYL) IVPB 500 mg  Status:  Discontinued        500 mg 100 mL/hr over 60 Minutes Intravenous Every 12 hours 06/23/22 2006 06/28/22 0849   06/23/22 1600  vancomycin (VANCOREADY) IVPB 1500 mg/300 mL        1,500 mg 150 mL/hr over 120 Minutes Intravenous  Once 06/23/22 1514 06/23/22 1912   06/23/22 1430  vancomycin (VANCOCIN) IVPB 1000 mg/200 mL premix        1,000 mg 200 mL/hr over 60 Minutes Intravenous  Once 06/23/22 1418 06/23/22 1624   06/23/22 1430  cefTRIAXone (ROCEPHIN) 2 g in sodium chloride 0.9 % 100 mL IVPB        2 g 200 mL/hr over 30 Minutes Intravenous  Once 06/23/22 1418 06/23/22 1506        Assessment/Plan POD 3/1 s/p I&D of 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 Carolynne Edouard and Dr. Janee Morn on 07/01/22 and 07/03/22 respectively  - Wound debridement left chest wall necrotizing soft tissue infection now measures 30cm x 9cm x 4cm deep, incision and drainage left flank x 2  - Cont abx. Cx's from 12/5 (IR) with  Prevotella species, beta lactamase positive, reincubated for better growth w/ sensitives pending. Cx from 12/8 pending. Abx selection per ID.  - WTD dressing changes BID. Breast binder.  - Await labs. Fever curve down from when I saw him 12/9. I was able to open up a small area on the medial aspect of the wound and break up some loculations with purulent drainage. After second debridement the  wound is now very open and seems well drained otherwise. Periwound with improvement of cellulitis/induration and erythema. Counter incisions clean and without drainage. Will discuss with MD if needs any further debridement but suspect we could continue with bedside dressing changes for now and monitor.    FEN: CM diet VTE: LMWH ID: Cefepime/Flagyl/Daptomycin per ID   - below per TRH -  T2DM, uncontrolled - A1c 11.6, SSI Alcohol abuse Tobacco abuse Obesity class II   LOS: 11 days    Jacinto Halim , [facility] 07/04/2022, 7:12 AM Please see Amion for pager number during day hours 7:00am-4:30pm

## 2022-07-04 NOTE — Progress Notes (Signed)
Inpatient Diabetes Program Recommendations  AACE/ADA: New Consensus Statement on Inpatient Glycemic Control (2015)  Target Ranges:  Prepandial:   less than 140 mg/dL      Peak postprandial:   less than 180 mg/dL (1-2 hours)      Critically ill patients:  140 - 180 mg/dL   Lab Results  Component Value Date   GLUCAP 144 (H) 07/04/2022   HGBA1C 11.6 (H) 06/24/2022    Review of Glycemic Control  Latest Reference Range & Units 07/03/22 11:45 07/03/22 16:12 07/03/22 21:59 07/04/22 06:11 07/04/22 12:14  Glucose-Capillary 70 - 99 mg/dL 937 (H) 169 (H) 678 (H) 139 (H) 144 (H)  Diabetes history: New DM dx this admission Outpatient Diabetes medications: NA Current orders for Inpatient glycemic control: 70/30 16 units BID, Novolog 0-15 units TID with meals, Novolog 0-5 units QHS  Inpatient Diabetes Program Recommendations:    Note patient did not receive 70/30 on yesterday morning or this morning.  Blood sugars have been mostly within goal even without AM dose.  Consider slight reduction in 70/30 insulin to 12 units bid.    Thanks,  Beryl Meager, RN, BC-ADM Inpatient Diabetes Coordinator Pager [phone]  (8a-5p)

## 2022-07-04 NOTE — Progress Notes (Signed)
   07/04/22 1510  Clinical Encounter Type  Visited With Patient;Health care provider  Visit Type Spiritual support;Initial  Referral From Nurse  Consult/Referral To Chaplain Melvenia Beam)  Recommendations Spiritual Assessment  Spiritual Encounters  Spiritual Needs Emotional;Grief support;Prayer  Stress Factors  Patient Stress Factors Exhausted;Health changes;Loss   Chaplain responded to Spiritual Care Consult Request placed by Nurse.  Chaplain met with [name] at patient's bedside. Chaplain provided meaningful presence and reflective listening. [name] shared his frustration with his illness and that he has been "Feeling Lousy" and the wounds don't seem to be healing. [name] shared his humility of not even being able to comfortably get up to void. [name] shared his frustration of not being able to work, nor enjoy his normal daily activities. The added burden of loss of wages during his hospitalization.   [name] has also experienced significant loss within the past several years. The death of all four of his grandparents and his biological father. His maternal grandparents raised him and their deaths have been significantly painful. His father had just recently come back into his life, which was very meaningful for [name]. Unfortunately, not long after them reuniting his father died very unexpectedly.  [name] shared that in the past he has found his strength through his relationship with God, believing that "God will never forsake him." Even through this adversity he believes that God remains with him and is "not finished with him" and has great plans for his life. At [name] we shared in intercessory prayer for comfort, healing, mercy, and grace to come upon [name]. [name] said that he felt encouraged and great relief from our visit; "Felt like the demons were cast out of his body. He was welcoming of another visit. Chaplain will continue to monitor Aron's needs and follow as  appropriate.  [address] [place], [place] [phone]

## 2022-07-04 NOTE — Plan of Care (Signed)
  Problem: Education: Goal: Ability to describe self-care measures that may prevent or decrease complications (Diabetes Survival Skills Education) will improve Outcome: Progressing Goal: Individualized Educational Video(s) Outcome: Progressing   Problem: Coping: Goal: Ability to adjust to condition or change in health will improve Outcome: Progressing   Problem: Fluid Volume: Goal: Ability to maintain a balanced intake and output will improve Outcome: Progressing   Problem: Health Behavior/Discharge Planning: Goal: Ability to identify and utilize available resources and services will improve Outcome: Progressing Goal: Ability to manage health-related needs will improve Outcome: Progressing   Problem: Metabolic: Goal: Ability to maintain appropriate glucose levels will improve Outcome: Progressing   Problem: Nutritional: Goal: Maintenance of adequate nutrition will improve Outcome: Progressing Goal: Progress toward achieving an optimal weight will improve Outcome: Progressing   Problem: Skin Integrity: Goal: Risk for impaired skin integrity will decrease Outcome: Progressing   Problem: Tissue Perfusion: Goal: Adequacy of tissue perfusion will improve Outcome: Progressing   Problem: Clinical Measurements: Goal: Ability to avoid or minimize complications of infection will improve Outcome: Progressing   Problem: Skin Integrity: Goal: Skin integrity will improve Outcome: Progressing   Problem: Education: Goal: Knowledge of General Education information will improve Description: Including pain rating scale, medication(s)/side effects and non-pharmacologic comfort measures Outcome: Progressing   Problem: Health Behavior/Discharge Planning: Goal: Ability to manage health-related needs will improve Outcome: Progressing   Problem: Clinical Measurements: Goal: Ability to maintain clinical measurements within normal limits will improve Outcome: Progressing Goal: Will  remain free from infection Outcome: Progressing Goal: Diagnostic test results will improve Outcome: Progressing Goal: Respiratory complications will improve Outcome: Progressing Goal: Cardiovascular complication will be avoided Outcome: Progressing   Problem: Activity: Goal: Risk for activity intolerance will decrease Outcome: Progressing   Problem: Nutrition: Goal: Adequate nutrition will be maintained Outcome: Progressing   Problem: Coping: Goal: Level of anxiety will decrease Outcome: Progressing   Problem: Elimination: Goal: Will not experience complications related to bowel motility Outcome: Progressing Goal: Will not experience complications related to urinary retention Outcome: Progressing   Problem: Pain Managment: Goal: General experience of comfort will improve Outcome: Progressing   Problem: Safety: Goal: Ability to remain free from injury will improve Outcome: Progressing   Problem: Skin Integrity: Goal: Risk for impaired skin integrity will decrease Outcome: Progressing   

## 2022-07-05 DIAGNOSIS — L03313 Cellulitis of chest wall: Secondary | ICD-10-CM | POA: Diagnosis not present

## 2022-07-05 DIAGNOSIS — N611 Abscess of the breast and nipple: Secondary | ICD-10-CM | POA: Diagnosis not present

## 2022-07-05 DIAGNOSIS — L732 Hidradenitis suppurativa: Secondary | ICD-10-CM | POA: Diagnosis not present

## 2022-07-05 LAB — CBC
HCT: 26.1 % — ABNORMAL LOW (ref 39.0–52.0)
Hemoglobin: 9.5 g/dL — ABNORMAL LOW (ref 13.0–17.0)
MCH: 28.1 pg (ref 26.0–34.0)
MCHC: 36.4 g/dL — ABNORMAL HIGH (ref 30.0–36.0)
MCV: 77.2 fL — ABNORMAL LOW (ref 80.0–100.0)
Platelets: 736 10*3/uL — ABNORMAL HIGH (ref 150–400)
RBC: 3.38 MIL/uL — ABNORMAL LOW (ref 4.22–5.81)
RDW: 13.7 % (ref 11.5–15.5)
WBC: 17.6 10*3/uL — ABNORMAL HIGH (ref 4.0–10.5)
nRBC: 0 % (ref 0.0–0.2)

## 2022-07-05 LAB — CK: Total CK: 43 U/L — ABNORMAL LOW (ref 49–397)

## 2022-07-05 LAB — COMPREHENSIVE METABOLIC PANEL
ALT: 32 U/L (ref 0–44)
AST: 30 U/L (ref 15–41)
Albumin: 1.6 g/dL — ABNORMAL LOW (ref 3.5–5.0)
Alkaline Phosphatase: 155 U/L — ABNORMAL HIGH (ref 38–126)
Anion gap: 10 (ref 5–15)
BUN: 9 mg/dL (ref 6–20)
CO2: 29 mmol/L (ref 22–32)
Calcium: 8.5 mg/dL — ABNORMAL LOW (ref 8.9–10.3)
Chloride: 96 mmol/L — ABNORMAL LOW (ref 98–111)
Creatinine, Ser: 0.68 mg/dL (ref 0.61–1.24)
GFR, Estimated: >60 mL/min (ref >60)
Glucose, Bld: 199 mg/dL — ABNORMAL HIGH (ref 70–99)
Potassium: 3.6 mmol/L (ref 3.5–5.1)
Sodium: 135 mmol/L (ref 135–145)
Total Bilirubin: 0.4 mg/dL (ref 0.3–1.2)
Total Protein: 7.7 g/dL (ref 6.5–8.1)

## 2022-07-05 LAB — GLUCOSE, CAPILLARY
Glucose-Capillary: 165 mg/dL — ABNORMAL HIGH (ref 70–99)
Glucose-Capillary: 232 mg/dL — ABNORMAL HIGH (ref 70–99)
Glucose-Capillary: 197 mg/dL — ABNORMAL HIGH (ref 70–99)
Glucose-Capillary: 226 mg/dL — ABNORMAL HIGH (ref 70–99)

## 2022-07-05 MED ORDER — FUROSEMIDE 10 MG/ML IJ SOLN
60.0000 mg | Freq: Once | INTRAMUSCULAR | Status: AC
  Administered 2022-07-05: 60 mg via INTRAVENOUS
  Filled 2022-07-05: qty 6

## 2022-07-05 MED ORDER — SODIUM CHLORIDE 0.9 % IV SOLN
3.0000 g | Freq: Four times a day (QID) | INTRAVENOUS | Status: DC
  Administered 2022-07-05 – 2022-07-07 (×7): 3 g via INTRAVENOUS
  Filled 2022-07-05 (×9): qty 8

## 2022-07-05 MED ORDER — SACCHAROMYCES BOULARDII 250 MG PO CAPS
250.0000 mg | ORAL_CAPSULE | Freq: Two times a day (BID) | ORAL | Status: DC
  Administered 2022-07-05 – 2022-07-13 (×17): 250 mg via ORAL
  Filled 2022-07-05 (×18): qty 1

## 2022-07-05 NOTE — Progress Notes (Signed)
PROGRESS NOTE  [name] XIP:[id number] DOB: [birth year] DOA: 06/23/2022 PCP: Patient, [name]   LOS: 12 days   Brief Narrative / Interim history: 36 year old male with history of obesity, EtOH use comes into the hospital with left chest wall cellulitis and sepsis.  He reports initially there was a boil in the area that popped open, and following that the whole area started to get worse.  Subjective / 24h Interval events: Overall feeling better.  Slept better last night.  Pain is controlled.  Still appreciates significant swelling in his hands and feet  Assesement and Plan: Principal Problem:   Cellulitis of chest wall Active Problems:   Hyperglycemia   Hyponatremia   Tobacco abuse   Alcohol abuse   Breast abscess   Hidradenitis suppurativa   Dental infection   Morbid obesity (HCC)   Type 2 diabetes mellitus with hyperglycemia (HCC)   Pyomyositis   Anxiety   Hidradenitis   Hypervolemia  Principal problem Sepsis due to chest wall cellulitis /abscess -ultrasound of the chest showed possible abscess.  IR was initially consulted and he is status post aspiration on 12/5.  Given persistent symptoms, general surgery was consulted and eventually was taken to the OR on 12/8 for I&D, and underwent repeat debridement 12/10.   -ID consulted, appreciate input.  Antibiotics per ID -Microbiology reviewed again today the.  MRSA nasal PCR negative.  Blood cultures negative at 5 days, final.  IR aspirate 12/5 showing beta-lactamase positive Prevotella, also moderate actinomyces.  Surgical cultures 12/8 showing few gram-positive cocci, gram-positive rods.  Continue to monitor daily -Afebrile now, white count pending this morning. -Has a PICC line for poor IV access  Active problems DM2, poorly controlled, with hyperglycemia -new diagnosis.  Currently on 7030 mix as well as sliding scale.  Consult diabetes coordinator.  Patient agreeable to home insulin.  CBGs are acceptable, as below.  Lab  Results  Component Value Date   HGBA1C 11.6 (H) 06/24/2022   CBG (last 3)  Recent Labs    07/04/22 1745 07/04/22 2145 07/05/22 0611  GLUCAP 162* 156* 165*    Hyponatremia-resolved with Lasix, he was hyponatremic in the setting of fluid overload  Mild LFT elevation-likely in the setting of acute illness, now normalized  Microcytic anemia-no bleeding, monitor, obtain anemia panel in the morning  Alcohol and tobacco use-recommend cessation  Obesity, class II-recommend weight loss  Scheduled Meds:  Chlorhexidine Gluconate Cloth  6 each Topical Daily   enoxaparin (LOVENOX) injection  55 mg Subcutaneous Q24H   feeding supplement  237 mL Oral BID WC   folic acid  1 mg Oral Daily   furosemide  60 mg Intravenous Once   insulin aspart  0-15 Units Subcutaneous TID WC   insulin aspart  0-5 Units Subcutaneous QHS   insulin aspart protamine- aspart  12 Units Subcutaneous BID WC   multivitamin with minerals  1 tablet Oral Daily   nicotine  14 mg Transdermal Daily   nutrition supplement (JUVEN)  1 packet Oral BID BM   polyethylene glycol  17 g Oral Daily   sodium chloride flush  10-40 mL Intracatheter Q12H   thiamine  100 mg Oral Daily   Continuous Infusions:  ampicillin-sulbactam (UNASYN) IV     DAPTOmycin (CUBICIN) 700 mg in sodium chloride 0.9 % IVPB 700 mg (07/04/22 2059)   PRN Meds:.acetaminophen **OR** [DISCONTINUED] acetaminophen, clonazePAM, HYDROmorphone (DILAUDID) injection, ibuprofen, metoprolol tartrate, morphine injection, ondansetron **OR** ondansetron (ZOFRAN) IV, mouth rinse, oxyCODONE, sodium chloride flush  Current Outpatient Medications  Medication Instructions   ibuprofen (ADVIL) 200 mg, Oral, Every 6 hours PRN   naproxen (NAPROSYN) 500 mg, Oral, 2 times daily    Diet Orders (From admission, onward)     Start     Ordered   07/03/22 1133  Diet Carb Modified Fluid consistency: Thin; Room service appropriate? Yes  Diet effective now       Question Answer  Comment  Diet-HS Snack? Nothing   Calorie Level Medium 1600-2000   Fluid consistency: Thin   Room service appropriate? Yes      07/03/22 1132            DVT prophylaxis: Enoxaparin   Lab Results  Component Value Date   PLT 765 (H) 07/04/2022      Code Status: Full Code  Family Communication: No family at bedside  Status is: Inpatient  Remains inpatient appropriate because: Severity of illness  Level of care: Med-Surg  Consultants:  ID General surgery IR  Objective: Vitals:   07/04/22 1747 07/04/22 2000 07/04/22 2352 07/05/22 0840  BP: (!) 164/84 131/73 (!) 140/78 113/81  Pulse: (!) 105     Resp:  18 18   Temp: 98.8 F (37.1 C) 98.6 F (37 C) 98.6 F (37 C) 98.8 F (37.1 C)  TempSrc: Oral Oral Oral Oral  SpO2:      Weight:      Height:        Intake/Output Summary (Last 24 hours) at 07/05/2022 0931 Last data filed at 07/05/2022 0300 Gross per 24 hour  Intake 153.58 ml  Output --  Net 153.58 ml    Wt Readings from Last 3 Encounters:  07/03/22 116.9 kg  09/16/16 124.7 kg  08/06/16 120.2 kg   Examination: Constitutional: NAD Eyes: lids and conjunctivae normal, no scleral icterus ENMT: mmm Neck: normal, supple Respiratory: clear to auscultation bilaterally, no wheezing, no crackles. Normal respiratory effort.  Cardiovascular: Regular rate and rhythm, no murmurs / rubs / gallops. No LE edema. Abdomen: soft, no distention, no tenderness. Bowel sounds positive.  Skin: no rashes  Data Reviewed: I have independently reviewed following labs and imaging studies   CBC Recent Labs  Lab 06/29/22 0453 07/02/22 0652 07/03/22 0019 07/04/22 1627  WBC 23.3* 32.1* 27.9* 19.9*  HGB 10.4* 11.4* 10.8* 9.4*  HCT 28.3* 32.2* 29.4* 27.4*  PLT 437* 641* 683* 765*  MCV 75.7* 77.0* 75.6* 79.2*  MCH 27.8 27.3 27.8 27.2  MCHC 36.7* 35.4 36.7* 34.3  RDW 13.1 13.2 13.3 13.5  LYMPHSABS  --  2.0  --   --   MONOABS  --  0.7  --   --   EOSABS  --  0.1  --    --   BASOSABS  --  0.1  --   --      Recent Labs  Lab 06/30/22 0859 07/01/22 1153 07/02/22 0652 07/03/22 0019 07/04/22 1627  NA 133* 133* 133* 135 135  K 3.3* 4.1 3.6 3.4* 4.0  CL 100 101 97* 101 97*  CO2 22 22 23 24 27   GLUCOSE 131* 134* 197* 201* 163*  BUN 9 7 10 12 10   CREATININE 0.76 0.84 0.69 0.78 0.78  CALCIUM 8.2* 8.1* 8.5* 8.5* 8.3*  AST  --   --   --  52* 36  ALT  --   --   --  46* 37  ALKPHOS  --   --   --  151* 174*  BILITOT  --   --   --  1.0 0.8  ALBUMIN  --   --   --  1.6* 1.6*  MG  --   --   --  2.2  --      ------------------------------------------------------------------------------------------------------------------ No results for input(s): "CHOL", "HDL", "LDLCALC", "TRIG", "CHOLHDL", "LDLDIRECT" in the last 72 hours.  Lab Results  Component Value Date   HGBA1C 11.6 (H) 06/24/2022   ------------------------------------------------------------------------------------------------------------------ No results for input(s): "TSH", "T4TOTAL", "T3FREE", "THYROIDAB" in the last 72 hours.  Invalid input(s): "FREET3"  Cardiac Enzymes No results for input(s): "CKMB", "TROPONINI", "MYOGLOBIN" in the last 168 hours.  Invalid input(s): "CK" ------------------------------------------------------------------------------------------------------------------ No results found for: "BNP"  CBG: Recent Labs  Lab 07/04/22 0611 07/04/22 1214 07/04/22 1745 07/04/22 2145 07/05/22 0611  GLUCAP 139* 144* 162* 156* 165*     Recent Results (from the past 240 hour(s))  SARS Coronavirus 2 by RT PCR (hospital order, performed in [facility] hospital lab) *cepheid single result test* Anterior Nasal Swab     Status: None   Collection Time: 06/25/22  2:26 PM   Specimen: Anterior Nasal Swab  Result Value Ref Range Status   SARS Coronavirus 2 by RT PCR NEGATIVE NEGATIVE Final    Comment: (NOTE) SARS-CoV-2 target nucleic acids are NOT DETECTED.  The SARS-CoV-2 RNA is  generally detectable in upper and lower respiratory specimens during the acute phase of infection. The lowest concentration of SARS-CoV-2 viral copies this assay can detect is 250 copies / mL. A negative result does not preclude SARS-CoV-2 infection and should not be used as the sole basis for treatment or other patient management decisions.  A negative result may occur with improper specimen collection / handling, submission of specimen other than nasopharyngeal swab, presence of viral mutation(s) within the areas targeted by this assay, and inadequate number of viral copies (<250 copies / mL). A negative result must be combined with clinical observations, patient history, and epidemiological information.  Fact Sheet for Patients:   RoadLapTop.co.zahttps://www.fda.gov/media/158405/download  Fact Sheet for Healthcare Providers: http://kim-miller.com/https://www.fda.gov/media/158404/download  This test is not yet approved or  cleared by the [place] FDA and has been authorized for detection and/or diagnosis of SARS-CoV-2 by FDA under an Emergency Use Authorization (EUA).  This EUA will remain in effect (meaning this test can be used) for the duration of the COVID-19 declaration under Section 564(b)(1) of the Act, 21 U.S.C. section 360bbb-3(b)(1), unless the authorization is terminated or revoked sooner.  Performed at [facility], [address] [place], [place] [address]   Aerobic/Anaerobic Culture w Gram Stain (surgical/deep wound)     Status: None   Collection Time: 06/28/22 11:53 AM   Specimen: Abscess  Result Value Ref Range Status   Specimen Description ABSCESS  Final   Special Requests LEFT BREAST  Final   Gram Stain   Final    ABUNDANT WBC PRESENT,BOTH PMN AND MONONUCLEAR ABUNDANT GRAM NEGATIVE RODS    Culture   Final    MODERATE PREVOTELLA SPECIES BETA LACTAMASE POSITIVE MODERATE ACTINOMYCES SPECIES Standardized susceptibility testing for this organism is not available. Performed at [facility], [address] [place], [place] [address]    Report Status 07/04/2022 FINAL  Final  Aerobic/Anaerobic Culture w Gram Stain (surgical/deep wound)     Status: None (Preliminary result)   Collection Time: 07/01/22  9:48 AM   Specimen: PATH Breast other; Tissue  Result Value Ref Range Status   Specimen Description WOUND  Final   Special Requests ABSCESS LEFT BREAST  Final  Gram Stain   Final    FEW GRAM POSITIVE COCCI RARE GRAM POSITIVE RODS RARE WBC PRESENT, PREDOMINANTLY PMN    Culture   Final    NO GROWTH 3 DAYS NO ANAEROBES ISOLATED; CULTURE IN PROGRESS FOR 5 DAYS Performed at [facility], [address], [place], [place] [address]    Report Status PENDING  Incomplete     Radiology Studies: No results found.   Pamella Pert, MD, PhD [facility]  Between 7 am - 7 pm I am available, please contact me via Amion (for emergencies) or Securechat (non urgent messages)  Between 7 pm - 7 am I am not available, please contact night coverage MD/APP via Amion

## 2022-07-05 NOTE — Progress Notes (Signed)
Mobility Specialist Progress Note    07/05/22 0938  Mobility  Activity Ambulated independently in hallway  Level of Assistance Standby assist, set-up cues, supervision of patient - no hands on  Assistive Device None  Distance Ambulated (ft) 350 ft  Activity Response Tolerated well  Mobility Referral Yes  $Mobility charge 1 Mobility   Pt received sitting EOB and agreeable. No complaints on walk. Returned to chair with call bell in reach.    [place] Mobility Specialist  Please Neurosurgeon or Rehab Office at [phone]

## 2022-07-05 NOTE — Anesthesia Preprocedure Evaluation (Signed)
Anesthesia Evaluation  Patient identified by MRN, date of birth, ID band Patient awake    Reviewed: Allergy & Precautions, NPO status , Patient's Chart, lab work & pertinent test results  History of Anesthesia Complications Negative for: history of anesthetic complications  Airway Mallampati: III  TM Distance: >3 FB Neck ROM: Full    Dental  (+) Dental Advisory Given, Teeth Intact   Pulmonary asthma , Current Smoker and Patient abstained from smoking.   breath sounds clear to auscultation       Cardiovascular negative cardio ROS  Rhythm:Regular     Neuro/Psych   Anxiety      Neuromuscular disease    GI/Hepatic negative GI ROS, Neg liver ROS,,,  Endo/Other  diabetes, Poorly Controlled    Renal/GU negative Renal ROS     Musculoskeletal negative musculoskeletal ROS (+)    Abdominal   Peds  Hematology   Anesthesia Other Findings   Reproductive/Obstetrics                             Anesthesia Physical Anesthesia Plan  ASA: 4  Anesthesia Plan: General   Post-op Pain Management:    Induction: Intravenous  PONV Risk Score and Plan: 1 and Ondansetron  Airway Management Planned: Oral ETT and LMA  Additional Equipment: None  Intra-op Plan:   Post-operative Plan: Extubation in OR  Informed Consent: I have reviewed the patients History and Physical, chart, labs and discussed the procedure including the risks, benefits and alternatives for the proposed anesthesia with the patient or authorized representative who has indicated his/her understanding and acceptance.     Dental advisory given  Plan Discussed with: CRNA  Anesthesia Plan Comments:        Anesthesia Quick Evaluation

## 2022-07-05 NOTE — Progress Notes (Signed)
No new complaints  Antibiotics:  Anti-infectives (From admission, onward)    Start     Dose/Rate Route Frequency Ordered Stop   07/05/22 1200  Ampicillin-Sulbactam (UNASYN) 3 g in sodium chloride 0.9 % 100 mL IVPB        3 g 200 mL/hr over 30 Minutes Intravenous Every 6 hours 07/05/22 0909     07/04/22 1400  piperacillin-tazobactam (ZOSYN) IVPB 3.375 g  Status:  Discontinued        3.375 g 12.5 mL/hr over 240 Minutes Intravenous Every 8 hours 07/04/22 0957 07/05/22 0909   07/03/22 1600  DAPTOmycin (CUBICIN) 700 mg in sodium chloride 0.9 % IVPB        8 mg/kg  89.2 kg (Adjusted) 128 mL/hr over 30 Minutes Intravenous Daily 07/03/22 1512     07/03/22 0000  vancomycin (VANCOREADY) IVPB 1250 mg/250 mL  Status:  Discontinued        1,250 mg 166.7 mL/hr over 90 Minutes Intravenous Every 12 hours 07/02/22 1119 07/03/22 1512   07/02/22 1215  vancomycin (VANCOREADY) IVPB 2000 mg/400 mL        2,000 mg 200 mL/hr over 120 Minutes Intravenous  Once 07/02/22 1119 07/03/22 0144   06/29/22 1200  ceFEPIme (MAXIPIME) 2 g in sodium chloride 0.9 % 100 mL IVPB  Status:  Discontinued        2 g 200 mL/hr over 30 Minutes Intravenous Every 8 hours 06/29/22 1005 07/04/22 0957   06/29/22 1100  metroNIDAZOLE (FLAGYL) tablet 500 mg  Status:  Discontinued        500 mg Oral Every 12 hours 06/29/22 1005 07/04/22 0957   06/28/22 1200  Ampicillin-Sulbactam (UNASYN) 3 g in sodium chloride 0.9 % 100 mL IVPB  Status:  Discontinued        3 g 200 mL/hr over 30 Minutes Intravenous Every 6 hours 06/28/22 0911 06/29/22 1005   06/24/22 0600  ceFEPIme (MAXIPIME) 2 g in sodium chloride 0.9 % 100 mL IVPB  Status:  Discontinued        2 g 200 mL/hr over 30 Minutes Intravenous Every 8 hours 06/23/22 2041 06/28/22 0911   06/24/22 0300  vancomycin (VANCOREADY) IVPB 1250 mg/250 mL  Status:  Discontinued        1,250 mg 166.7 mL/hr over 90 Minutes Intravenous Every 12 hours 06/23/22 1543 06/29/22 0819   06/23/22 2100   ceFEPIme (MAXIPIME) 2 g in sodium chloride 0.9 % 100 mL IVPB        2 g 200 mL/hr over 30 Minutes Intravenous  Once 06/23/22 2006 06/23/22 2153   06/23/22 2100  metroNIDAZOLE (FLAGYL) IVPB 500 mg  Status:  Discontinued        500 mg 100 mL/hr over 60 Minutes Intravenous Every 12 hours 06/23/22 2006 06/28/22 0849   06/23/22 1600  vancomycin (VANCOREADY) IVPB 1500 mg/300 mL        1,500 mg 150 mL/hr over 120 Minutes Intravenous  Once 06/23/22 1514 06/23/22 1912   06/23/22 1430  vancomycin (VANCOCIN) IVPB 1000 mg/200 mL premix        1,000 mg 200 mL/hr over 60 Minutes Intravenous  Once 06/23/22 1418 06/23/22 1624   06/23/22 1430  cefTRIAXone (ROCEPHIN) 2 g in sodium chloride 0.9 % 100 mL IVPB        2 g 200 mL/hr over 30 Minutes Intravenous  Once 06/23/22 1418 06/23/22 1506       Medications: Scheduled Meds:  Chlorhexidine Gluconate Cloth  6 each  Topical Daily   enoxaparin (LOVENOX) injection  55 mg Subcutaneous Q24H   feeding supplement  237 mL Oral BID WC   folic acid  1 mg Oral Daily   insulin aspart  0-15 Units Subcutaneous TID WC   insulin aspart  0-5 Units Subcutaneous QHS   insulin aspart protamine- aspart  12 Units Subcutaneous BID WC   multivitamin with minerals  1 tablet Oral Daily   nicotine  14 mg Transdermal Daily   nutrition supplement (JUVEN)  1 packet Oral BID BM   polyethylene glycol  17 g Oral Daily   saccharomyces boulardii  250 mg Oral BID   sodium chloride flush  10-40 mL Intracatheter Q12H   thiamine  100 mg Oral Daily   Continuous Infusions:  ampicillin-sulbactam (UNASYN) IV 3 g (07/05/22 1254)   DAPTOmycin (CUBICIN) 700 mg in sodium chloride 0.9 % IVPB 700 mg (07/04/22 2059)   PRN Meds:.acetaminophen **OR** [DISCONTINUED] acetaminophen, clonazePAM, HYDROmorphone (DILAUDID) injection, ibuprofen, metoprolol tartrate, morphine injection, ondansetron **OR** ondansetron (ZOFRAN) IV, mouth rinse, oxyCODONE, sodium chloride flush    Objective: Weight change:    Intake/Output Summary (Last 24 hours) at 07/05/2022 1429 Last data filed at 07/05/2022 0300 Gross per 24 hour  Intake 143.58 ml  Output --  Net 143.58 ml    Blood pressure 113/81, pulse (!) 105, temperature 98.8 F (37.1 C), temperature source Oral, resp. rate 18, height 5' 9.02" (1.753 m), weight 116.9 kg, SpO2 92 %. Temp:  [98.6 F (37 C)-98.8 F (37.1 C)] 98.8 F (37.1 C) (12/12 0840) Pulse Rate:  [105] 105 (12/11 1747) Resp:  [18] 18 (12/11 2352) BP: (113-164)/(73-84) 113/81 (12/12 0840)  Physical Exam: Physical Exam Constitutional:      Appearance: He is well-developed.  HENT:     Head: Normocephalic and atraumatic.  Eyes:     Conjunctiva/sclera: Conjunctivae normal.  Cardiovascular:     Rate and Rhythm: Normal rate and regular rhythm.  Pulmonary:     Effort: Pulmonary effort is normal. No respiratory distress.     Breath sounds: No wheezing.  Abdominal:     General: There is no distension.     Palpations: Abdomen is soft.  Musculoskeletal:     Cervical back: Normal range of motion and neck supple.  Skin:    General: Skin is warm and dry.     Findings: No erythema or rash.  Neurological:     General: No focal deficit present.     Mental Status: He is alert and oriented to person, place, and time.  Psychiatric:        Mood and Affect: Mood normal.        Behavior: Behavior normal.        Thought Content: Thought content normal.        Judgment: Judgment normal.   Chest bandaged   Pictures from General surgery                CBC:    BMET Recent Labs    07/03/22 0019 07/04/22 1627  NA 135 135  K 3.4* 4.0  CL 101 97*  CO2 24 27  GLUCOSE 201* 163*  BUN 12 10  CREATININE 0.78 0.78  CALCIUM 8.5* 8.3*      Liver Panel  Recent Labs    07/03/22 0019 07/04/22 1627  PROT 7.8 7.7  ALBUMIN 1.6* 1.6*  AST 52* 36  ALT 46* 37  ALKPHOS 151* 174*  BILITOT 1.0 0.8  Sedimentation Rate No results for input(s):  "ESRSEDRATE" in the last 72 hours. C-Reactive Protein No results for input(s): "CRP" in the last 72 hours.  Micro Results: Recent Results (from the past 720 hour(s))  MRSA Next Gen by PCR, Nasal     Status: None   Collection Time: 06/23/22 12:36 AM   Specimen: Nasal Mucosa; Nasal Swab  Result Value Ref Range Status   MRSA by PCR Next Gen NOT DETECTED NOT DETECTED Final    Comment: (NOTE) The GeneXpert MRSA Assay (FDA approved for NASAL specimens only), is one component of a comprehensive MRSA colonization surveillance program. It is not intended to diagnose MRSA infection nor to guide or monitor treatment for MRSA infections. Test performance is not FDA approved in patients less than 71 years old. Performed at [facility], [address], [place], [place] [address]   Blood culture (routine x 2)     Status: None   Collection Time: 06/23/22  1:40 PM   Specimen: BLOOD RIGHT ARM  Result Value Ref Range Status   Specimen Description BLOOD RIGHT ARM  Final   Special Requests   Final    BOTTLES DRAWN AEROBIC AND ANAEROBIC Blood Culture results may not be optimal due to an excessive volume of blood received in culture bottles   Culture   Final    NO GROWTH 5 DAYS Performed at [facility], [address], [place], [place] [address]    Report Status 06/28/2022 FINAL  Final  Blood culture (routine x 2)     Status: None   Collection Time: 06/23/22  4:10 PM   Specimen: BLOOD  Result Value Ref Range Status   Specimen Description BLOOD SITE NOT SPECIFIED  Final   Special Requests   Final    BOTTLES DRAWN AEROBIC AND ANAEROBIC Blood Culture results may not be optimal due to an excessive volume of blood received in culture bottles   Culture   Final    NO GROWTH 5 DAYS Performed at [facility], [address], [place], [place] [address]    Report Status 06/28/2022 FINAL  Final  SARS Coronavirus 2 by RT PCR (hospital order, performed in [facility] hospital lab) *cepheid  single result test* Anterior Nasal Swab     Status: None   Collection Time: 06/25/22  2:26 PM   Specimen: Anterior Nasal Swab  Result Value Ref Range Status   SARS Coronavirus 2 by RT PCR NEGATIVE NEGATIVE Final    Comment: (NOTE) SARS-CoV-2 target nucleic acids are NOT DETECTED.  The SARS-CoV-2 RNA is generally detectable in upper and lower respiratory specimens during the acute phase of infection. The lowest concentration of SARS-CoV-2 viral copies this assay can detect is 250 copies / mL. A negative result does not preclude SARS-CoV-2 infection and should not be used as the sole basis for treatment or other patient management decisions.  A negative result may occur with improper specimen collection / handling, submission of specimen other than nasopharyngeal swab, presence of viral mutation(s) within the areas targeted by this assay, and inadequate number of viral copies (<250 copies / mL). A negative result must be combined with clinical observations, patient history, and epidemiological information.  Fact Sheet for Patients:   RoadLapTop.co.za  Fact Sheet for Healthcare Providers: http://kim-miller.com/  This test is not yet approved or  cleared by the [place] FDA and has been authorized for detection and/or diagnosis of SARS-CoV-2 by FDA under an Emergency Use Authorization (EUA).  This EUA will remain in effect (meaning this test can be used) for the duration of the COVID-19 declaration under Section 564(b)(1) of the Act, 21 U.S.C. section 360bbb-3(b)(1), unless the authorization is terminated or revoked sooner.  Performed at [facility], [place] [address], [place], [place] [address]   Aerobic/Anaerobic Culture w Gram Stain (surgical/deep wound)     Status: None   Collection Time: 06/28/22 11:53 AM   Specimen: Abscess  Result Value Ref Range Status   Specimen Description ABSCESS  Final   Special Requests LEFT  BREAST  Final   Gram Stain   Final    ABUNDANT WBC PRESENT,BOTH PMN AND MONONUCLEAR ABUNDANT GRAM NEGATIVE RODS    Culture   Final    MODERATE PREVOTELLA SPECIES BETA LACTAMASE POSITIVE MODERATE ACTINOMYCES SPECIES Standardized susceptibility testing for this organism is not available. Performed at [facility], [place] [address] [place], [place] [address]    Report Status 07/04/2022 FINAL  Final  Aerobic/Anaerobic Culture w Gram Stain (surgical/deep wound)     Status: None (Preliminary result)   Collection Time: 07/01/22  9:48 AM   Specimen: PATH Breast other; Tissue  Result Value Ref Range Status   Specimen Description WOUND  Final   Special Requests ABSCESS LEFT BREAST  Final   Gram Stain   Final    FEW GRAM POSITIVE COCCI RARE GRAM POSITIVE RODS RARE WBC PRESENT, PREDOMINANTLY PMN    Culture   Final    NO GROWTH 4 DAYS NO ANAEROBES ISOLATED; CULTURE IN PROGRESS FOR 5 DAYS Performed at [facility], [place] [address], [place], [place] [address]    Report Status PENDING  Incomplete    Studies/Results: [place] EKG SITE RITE  Result Date: 07/04/2022 If Site Rite image not attached, placement could not be confirmed due to current cardiac rhythm.     Assessment/Plan:  INTERVAL HISTORY:  + actionomyces on culture  Principal Problem:   Cellulitis of chest wall Active Problems:   Hyperglycemia   Hyponatremia   Tobacco abuse   Alcohol abuse   Breast abscess   Hidradenitis suppurativa   Dental infection   Morbid obesity ([place])   Type 2 diabetes mellitus with hyperglycemia ([place])   Pyomyositis   Anxiety   Hidradenitis   Hypervolemia    [name] is a 36 y.o. male with morbid obesity newly diagnosed noticed diabetes mellitus likely retinitis suppurativa with recurrent infections in his soft tissues in particular axilla groin now admitted with eft soft tissue breast infection   #1  Left chest wall necrotizing soft tissue infection with abscess:   Triggers  have yielded actinomyces species which makes sense in his clinical context along with beta-lactamase producing Prevotella.  So far no aerobic gram-negative rods have grown and no MRSA has been isolated and his PCR was negative for MRSA.  We will narrow him today to Unasyn and daptomycin with plans for removing the daptomycin soon  Need protracted therapy with amoxicillin for his actinomyces infection (ie likely 6 months)   2   Hidradenitis suppurativa: He will need connected to dermatology.  3.  Diabetes mellitus: Would benefit from ozempic or simliar drug I would think  4.  Volume overload hopefully reducing the volume of IV antibiotics and getting will help and we would like to go to po eventually  I spent 54 minutes with the patient including than 50% of the time in face to face counseling of the patient polymicrobial severe soft tissue infection,  along with review of medical records in preparation for the visit and during the visit and in coordination of his care with General Surgery.         LOS: 12 days   Alcide Evener 07/05/2022, 2:29 PM

## 2022-07-05 NOTE — Anesthesia Postprocedure Evaluation (Signed)
Anesthesia Post Note  Patient: [name]  Procedure(s) Performed: INCISION AND DRAINAGE ABSCESS (Left)     Patient location during evaluation: PACU Anesthesia Type: General Level of consciousness: awake and alert Pain management: pain level controlled Vital Signs Assessment: post-procedure vital signs reviewed and stable Respiratory status: spontaneous breathing, nonlabored ventilation, respiratory function stable and patient connected to nasal cannula oxygen Cardiovascular status: blood pressure returned to baseline and stable Postop Assessment: no apparent nausea or vomiting Anesthetic complications: no  No notable events documented.  Last Vitals:  Vitals:   07/04/22 2352 07/05/22 0840  BP: (!) 140/78 113/81  Pulse:    Resp: 18   Temp: 37 C 37.1 C  SpO2:      Last Pain:  Vitals:   07/05/22 0840  TempSrc: Oral  PainSc:                  [name]

## 2022-07-05 NOTE — Plan of Care (Signed)

## 2022-07-05 NOTE — Progress Notes (Signed)
2 Days Post-Op  Subjective: CC: Pain over wound. Pain medications are helping. Requiring IV pain meds for dressing changes, otherwise using PO pain medication throughout the day.   Seen with RN for dressing change.   Afebrile overnight. Stable tachycardia this am in the low 100's. No hypotension overnight or this am. WBC down yesterday at  pending this am. Cx's from 12/5 with Prevotella species, beta lactamase positive, moderate actinomyces. Cx from 12/8 pending.   Discussed with ID at bedside.   Objective: Vital signs in last 24 hours: Temp:  [98.6 F (37 C)-99.8 F (37.7 C)] 98.6 F (37 C) (12/11 2352) Pulse Rate:  [105-108] 105 (12/11 1747) Resp:  [18-20] 18 (12/11 2352) BP: (122-164)/(70-84) 140/78 (12/11 2352) Last BM Date : 07/04/22  Intake/Output from previous day: 12/11 0701 - 12/12 0700 In: 153.6 [I.V.:10; IV Piggyback:143.6] Out: -  Intake/Output this shift: No intake/output data recorded.  PE: Gen:  Alert, NAD, pleasant.  Wound: Left breast/chest wall. See pictures below. 30cm x 9cm x 4cm deep with mixed granulation tissue, fibrinous tissue (more central posterior) and some cauterized tissue. Area that was opened on 12/11 w/ ~4cm tracking cephalad and anteriorly with still a small amount of purulent drainage expressed but appears open, draining and there is no further loculations that could be broken up. Otherwise no drainage or tracking/tunneling of the wound. Induration and erythema along the left flank and lateral abdomen are stable/improved from 12/9. Counter incisions x 2 clean without drainage.   <--- (Front of body/Anterior) -- Orientation  -- (Back of body/Posterior) -->  Lab Results:  Recent Labs    07/03/22 0019 07/04/22 1627  WBC 27.9* 19.9*  HGB 10.8* 9.4*  HCT 29.4* 27.4*  PLT 683* 765*    BMET Recent Labs    07/03/22 0019 07/04/22 1627  NA 135 135  K 3.4* 4.0  CL 101 97*  CO2 24 27  GLUCOSE 201* 163*  BUN 12 10  CREATININE 0.78  0.78  CALCIUM 8.5* 8.3*    PT/INR No results for input(s): "LABPROT", "INR" in the last 72 hours. CMP     Component Value Date/Time   NA 135 07/04/2022 1627   K 4.0 07/04/2022 1627   CL 97 (L) 07/04/2022 1627   CO2 27 07/04/2022 1627   GLUCOSE 163 (H) 07/04/2022 1627   BUN 10 07/04/2022 1627   CREATININE 0.78 07/04/2022 1627   CALCIUM 8.3 (L) 07/04/2022 1627   PROT 7.7 07/04/2022 1627   ALBUMIN 1.6 (L) 07/04/2022 1627   AST 36 07/04/2022 1627   ALT 37 07/04/2022 1627   ALKPHOS 174 (H) 07/04/2022 1627   BILITOT 0.8 07/04/2022 1627   GFRNONAA >60 07/04/2022 1627   Lipase  No results found for: "LIPASE"  Studies/Results: [place] EKG SITE RITE  Result Date: 07/04/2022 If Site Rite image not attached, placement could not be confirmed due to current cardiac rhythm.   Anti-infectives: Anti-infectives (From admission, onward)    Start     Dose/Rate Route Frequency Ordered Stop   07/04/22 1400  piperacillin-tazobactam (ZOSYN) IVPB 3.375 g        3.375 g 12.5 mL/hr over 240 Minutes Intravenous Every 8 hours 07/04/22 0957     07/03/22 1600  DAPTOmycin (CUBICIN) 700 mg in sodium chloride 0.9 % IVPB        8 mg/kg  89.2 kg (Adjusted) 128 mL/hr over 30 Minutes Intravenous Daily 07/03/22 1512     07/03/22 0000  vancomycin (VANCOREADY) IVPB 1250 mg/250  mL  Status:  Discontinued        1,250 mg 166.7 mL/hr over 90 Minutes Intravenous Every 12 hours 07/02/22 1119 07/03/22 1512   07/02/22 1215  vancomycin (VANCOREADY) IVPB 2000 mg/400 mL        2,000 mg 200 mL/hr over 120 Minutes Intravenous  Once 07/02/22 1119 07/03/22 0144   06/29/22 1200  ceFEPIme (MAXIPIME) 2 g in sodium chloride 0.9 % 100 mL IVPB  Status:  Discontinued        2 g 200 mL/hr over 30 Minutes Intravenous Every 8 hours 06/29/22 1005 07/04/22 0957   06/29/22 1100  metroNIDAZOLE (FLAGYL) tablet 500 mg  Status:  Discontinued        500 mg Oral Every 12 hours 06/29/22 1005 07/04/22 0957   06/28/22 1200   Ampicillin-Sulbactam (UNASYN) 3 g in sodium chloride 0.9 % 100 mL IVPB  Status:  Discontinued        3 g 200 mL/hr over 30 Minutes Intravenous Every 6 hours 06/28/22 0911 06/29/22 1005   06/24/22 0600  ceFEPIme (MAXIPIME) 2 g in sodium chloride 0.9 % 100 mL IVPB  Status:  Discontinued        2 g 200 mL/hr over 30 Minutes Intravenous Every 8 hours 06/23/22 2041 06/28/22 0911   06/24/22 0300  vancomycin (VANCOREADY) IVPB 1250 mg/250 mL  Status:  Discontinued        1,250 mg 166.7 mL/hr over 90 Minutes Intravenous Every 12 hours 06/23/22 1543 06/29/22 0819   06/23/22 2100  ceFEPIme (MAXIPIME) 2 g in sodium chloride 0.9 % 100 mL IVPB        2 g 200 mL/hr over 30 Minutes Intravenous  Once 06/23/22 2006 06/23/22 2153   06/23/22 2100  metroNIDAZOLE (FLAGYL) IVPB 500 mg  Status:  Discontinued        500 mg 100 mL/hr over 60 Minutes Intravenous Every 12 hours 06/23/22 2006 06/28/22 0849   06/23/22 1600  vancomycin (VANCOREADY) IVPB 1500 mg/300 mL        1,500 mg 150 mL/hr over 120 Minutes Intravenous  Once 06/23/22 1514 06/23/22 1912   06/23/22 1430  vancomycin (VANCOCIN) IVPB 1000 mg/200 mL premix        1,000 mg 200 mL/hr over 60 Minutes Intravenous  Once 06/23/22 1418 06/23/22 1624   06/23/22 1430  cefTRIAXone (ROCEPHIN) 2 g in sodium chloride 0.9 % 100 mL IVPB        2 g 200 mL/hr over 30 Minutes Intravenous  Once 06/23/22 1418 06/23/22 1506        Assessment/Plan POD 4/2 s/p I&D of 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 Carolynne Edouard and Dr. Janee Morn on 07/01/22 and 07/03/22 respectively  - Wound debridement left chest wall necrotizing soft tissue infection now measures 30cm x 9cm x 4cm deep, incision and drainage left flank x 2  - Cont abx. Cx's from 12/5 (IR) with Prevotella species, beta lactamase positive, moderate actinomyces. Cx from 12/8 pending. Abx selection per ID.  - WTD dressing changes BID. We will see the AM dressing change with staff so we can evaluate the wound.   - No plans to return to the OR at this time. Continue bedside dressing changes and abx.    FEN: CM diet VTE: SCDs, LMWH ID: Cefepime/Flagyl/Daptomycin per ID   - below per TRH -  T2DM, uncontrolled - A1c 11.6, SSI Alcohol abuse Tobacco abuse Obesity class II   LOS: 12 days    Jacinto Halim ,  [facility] 07/05/2022, 7:59 AM Please see Amion for pager number during day hours 7:00am-4:30pm

## 2022-07-06 DIAGNOSIS — L0291 Cutaneous abscess, unspecified: Secondary | ICD-10-CM | POA: Diagnosis not present

## 2022-07-06 DIAGNOSIS — L03313 Cellulitis of chest wall: Secondary | ICD-10-CM | POA: Diagnosis not present

## 2022-07-06 DIAGNOSIS — A419 Sepsis, unspecified organism: Secondary | ICD-10-CM | POA: Diagnosis not present

## 2022-07-06 DIAGNOSIS — E871 Hypo-osmolality and hyponatremia: Secondary | ICD-10-CM | POA: Diagnosis not present

## 2022-07-06 DIAGNOSIS — A429 Actinomycosis, unspecified: Secondary | ICD-10-CM

## 2022-07-06 LAB — CBC
HCT: 24.5 % — ABNORMAL LOW (ref 39.0–52.0)
Hemoglobin: 8.5 g/dL — ABNORMAL LOW (ref 13.0–17.0)
MCH: 27.2 pg (ref 26.0–34.0)
MCHC: 34.7 g/dL (ref 30.0–36.0)
MCV: 78.3 fL — ABNORMAL LOW (ref 80.0–100.0)
Platelets: 719 10*3/uL — ABNORMAL HIGH (ref 150–400)
RBC: 3.13 MIL/uL — ABNORMAL LOW (ref 4.22–5.81)
RDW: 13.8 % (ref 11.5–15.5)
WBC: 12.2 10*3/uL — ABNORMAL HIGH (ref 4.0–10.5)
nRBC: 0 % (ref 0.0–0.2)

## 2022-07-06 LAB — GLUCOSE, CAPILLARY
Glucose-Capillary: 168 mg/dL — ABNORMAL HIGH (ref 70–99)
Glucose-Capillary: 182 mg/dL — ABNORMAL HIGH (ref 70–99)
Glucose-Capillary: 184 mg/dL — ABNORMAL HIGH (ref 70–99)
Glucose-Capillary: 230 mg/dL — ABNORMAL HIGH (ref 70–99)
Glucose-Capillary: 251 mg/dL — ABNORMAL HIGH (ref 70–99)

## 2022-07-06 LAB — COMPREHENSIVE METABOLIC PANEL
ALT: 32 U/L (ref 0–44)
AST: 32 U/L (ref 15–41)
Albumin: 1.6 g/dL — ABNORMAL LOW (ref 3.5–5.0)
Alkaline Phosphatase: 130 U/L — ABNORMAL HIGH (ref 38–126)
Anion gap: 8 (ref 5–15)
BUN: 6 mg/dL (ref 6–20)
CO2: 28 mmol/L (ref 22–32)
Calcium: 8.3 mg/dL — ABNORMAL LOW (ref 8.9–10.3)
Chloride: 99 mmol/L (ref 98–111)
Creatinine, Ser: 0.67 mg/dL (ref 0.61–1.24)
GFR, Estimated: >60 mL/min (ref >60)
Glucose, Bld: 161 mg/dL — ABNORMAL HIGH (ref 70–99)
Potassium: 3.8 mmol/L (ref 3.5–5.1)
Sodium: 135 mmol/L (ref 135–145)
Total Bilirubin: 0.3 mg/dL (ref 0.3–1.2)
Total Protein: 7.4 g/dL (ref 6.5–8.1)

## 2022-07-06 LAB — MAGNESIUM: Magnesium: 2 mg/dL (ref 1.7–2.4)

## 2022-07-06 NOTE — Progress Notes (Signed)
No new complaints  Antibiotics:  Anti-infectives (From admission, onward)    Start     Dose/Rate Route Frequency Ordered Stop   07/05/22 1200  Ampicillin-Sulbactam (UNASYN) 3 g in sodium chloride 0.9 % 100 mL IVPB        3 g 200 mL/hr over 30 Minutes Intravenous Every 6 hours 07/05/22 0909     07/04/22 1400  piperacillin-tazobactam (ZOSYN) IVPB 3.375 g  Status:  Discontinued        3.375 g 12.5 mL/hr over 240 Minutes Intravenous Every 8 hours 07/04/22 0957 07/05/22 0909   07/03/22 1600  DAPTOmycin (CUBICIN) 700 mg in sodium chloride 0.9 % IVPB  Status:  Discontinued        8 mg/kg  89.2 kg (Adjusted) 128 mL/hr over 30 Minutes Intravenous Daily 07/03/22 1512 07/06/22 1004   07/03/22 0000  vancomycin (VANCOREADY) IVPB 1250 mg/250 mL  Status:  Discontinued        1,250 mg 166.7 mL/hr over 90 Minutes Intravenous Every 12 hours 07/02/22 1119 07/03/22 1512   07/02/22 1215  vancomycin (VANCOREADY) IVPB 2000 mg/400 mL        2,000 mg 200 mL/hr over 120 Minutes Intravenous  Once 07/02/22 1119 07/03/22 0144   06/29/22 1200  ceFEPIme (MAXIPIME) 2 g in sodium chloride 0.9 % 100 mL IVPB  Status:  Discontinued        2 g 200 mL/hr over 30 Minutes Intravenous Every 8 hours 06/29/22 1005 07/04/22 0957   06/29/22 1100  metroNIDAZOLE (FLAGYL) tablet 500 mg  Status:  Discontinued        500 mg Oral Every 12 hours 06/29/22 1005 07/04/22 0957   06/28/22 1200  Ampicillin-Sulbactam (UNASYN) 3 g in sodium chloride 0.9 % 100 mL IVPB  Status:  Discontinued        3 g 200 mL/hr over 30 Minutes Intravenous Every 6 hours 06/28/22 0911 06/29/22 1005   06/24/22 0600  ceFEPIme (MAXIPIME) 2 g in sodium chloride 0.9 % 100 mL IVPB  Status:  Discontinued        2 g 200 mL/hr over 30 Minutes Intravenous Every 8 hours 06/23/22 2041 06/28/22 0911   06/24/22 0300  vancomycin (VANCOREADY) IVPB 1250 mg/250 mL  Status:  Discontinued        1,250 mg 166.7 mL/hr over 90 Minutes Intravenous Every 12 hours  06/23/22 1543 06/29/22 0819   06/23/22 2100  ceFEPIme (MAXIPIME) 2 g in sodium chloride 0.9 % 100 mL IVPB        2 g 200 mL/hr over 30 Minutes Intravenous  Once 06/23/22 2006 06/23/22 2153   06/23/22 2100  metroNIDAZOLE (FLAGYL) IVPB 500 mg  Status:  Discontinued        500 mg 100 mL/hr over 60 Minutes Intravenous Every 12 hours 06/23/22 2006 06/28/22 0849   06/23/22 1600  vancomycin (VANCOREADY) IVPB 1500 mg/300 mL        1,500 mg 150 mL/hr over 120 Minutes Intravenous  Once 06/23/22 1514 06/23/22 1912   06/23/22 1430  vancomycin (VANCOCIN) IVPB 1000 mg/200 mL premix        1,000 mg 200 mL/hr over 60 Minutes Intravenous  Once 06/23/22 1418 06/23/22 1624   06/23/22 1430  cefTRIAXone (ROCEPHIN) 2 g in sodium chloride 0.9 % 100 mL IVPB        2 g 200 mL/hr over 30 Minutes Intravenous  Once 06/23/22 1418 06/23/22 1506       Medications:  Scheduled Meds:  Chlorhexidine Gluconate Cloth  6 each Topical Daily   enoxaparin (LOVENOX) injection  55 mg Subcutaneous Q24H   feeding supplement  237 mL Oral BID WC   folic acid  1 mg Oral Daily   insulin aspart  0-15 Units Subcutaneous TID WC   insulin aspart  0-5 Units Subcutaneous QHS   insulin aspart protamine- aspart  12 Units Subcutaneous BID WC   multivitamin with minerals  1 tablet Oral Daily   nicotine  14 mg Transdermal Daily   nutrition supplement (JUVEN)  1 packet Oral BID BM   polyethylene glycol  17 g Oral Daily   saccharomyces boulardii  250 mg Oral BID   sodium chloride flush  10-40 mL Intracatheter Q12H   thiamine  100 mg Oral Daily   Continuous Infusions:  ampicillin-sulbactam (UNASYN) IV 3 g (07/06/22 1123)   PRN Meds:.acetaminophen **OR** [DISCONTINUED] acetaminophen, clonazePAM, HYDROmorphone (DILAUDID) injection, ibuprofen, metoprolol tartrate, morphine injection, ondansetron **OR** ondansetron (ZOFRAN) IV, mouth rinse, oxyCODONE, sodium chloride flush    Objective: Weight change:   Intake/Output Summary (Last 24  hours) at 07/06/2022 1131 Last data filed at 07/06/2022 0804 Gross per 24 hour  Intake 529.37 ml  Output 0 ml  Net 529.37 ml    Blood pressure 117/71, pulse 93, temperature 98.1 F (36.7 C), temperature source Oral, resp. rate 20, height 5' 9.02" (1.753 m), weight 116.9 kg, SpO2 97 %. Temp:  [98.1 F (36.7 C)-99.8 F (37.7 C)] 98.1 F (36.7 C) (12/13 0802) Pulse Rate:  [93-101] 93 (12/13 0802) Resp:  [20] 20 (12/13 0414) BP: (117-139)/(68-80) 117/71 (12/13 0802) SpO2:  [92 %-97 %] 97 % (12/13 0414)  Physical Exam: Physical Exam Constitutional:      Appearance: He is well-developed.  HENT:     Head: Normocephalic and atraumatic.  Eyes:     Conjunctiva/sclera: Conjunctivae normal.  Cardiovascular:     Rate and Rhythm: Normal rate and regular rhythm.  Pulmonary:     Effort: Pulmonary effort is normal. No respiratory distress.     Breath sounds: No wheezing.  Abdominal:     General: There is no distension.     Palpations: Abdomen is soft.  Musculoskeletal:     Cervical back: Normal range of motion and neck supple.  Skin:    General: Skin is warm and dry.  Neurological:     General: No focal deficit present.     Mental Status: He is alert and oriented to person, place, and time.  Psychiatric:        Mood and Affect: Mood normal.        Behavior: Behavior normal.        Thought Content: Thought content normal.        Judgment: Judgment normal.   Chest bandaged   Pictures from General surgery                CBC:    BMET Recent Labs    07/05/22 1457 07/06/22 0550  NA 135 135  K 3.6 3.8  CL 96* 99  CO2 29 28  GLUCOSE 199* 161*  BUN 9 6  CREATININE 0.68 0.67  CALCIUM 8.5* 8.3*      Liver Panel  Recent Labs    07/05/22 1457 07/06/22 0550  PROT 7.7 7.4  ALBUMIN 1.6* 1.6*  AST 30 32  ALT 32 32  ALKPHOS 155* 130*  BILITOT 0.4 0.3        Sedimentation Rate No results for input(s): "ESRSEDRATE"  in the last 72  hours. C-Reactive Protein No results for input(s): "CRP" in the last 72 hours.  Micro Results: Recent Results (from the past 720 hour(s))  MRSA Next Gen by PCR, Nasal     Status: None   Collection Time: 06/23/22 12:36 AM   Specimen: Nasal Mucosa; Nasal Swab  Result Value Ref Range Status   MRSA by PCR Next Gen NOT DETECTED NOT DETECTED Final    Comment: (NOTE) The GeneXpert MRSA Assay (FDA approved for NASAL specimens only), is one component of a comprehensive MRSA colonization surveillance program. It is not intended to diagnose MRSA infection nor to guide or monitor treatment for MRSA infections. Test performance is not FDA approved in patients less than 60 years old. Performed at [facility], [address] [place], [place] [address]   Blood culture (routine x 2)     Status: None   Collection Time: 06/23/22  1:40 PM   Specimen: BLOOD RIGHT ARM  Result Value Ref Range Status   Specimen Description BLOOD RIGHT ARM  Final   Special Requests   Final    BOTTLES DRAWN AEROBIC AND ANAEROBIC Blood Culture results may not be optimal due to an excessive volume of blood received in culture bottles   Culture   Final    NO GROWTH 5 DAYS Performed at [facility], [address], [place], [place] [address]    Report Status 06/28/2022 FINAL  Final  Blood culture (routine x 2)     Status: None   Collection Time: 06/23/22  4:10 PM   Specimen: BLOOD  Result Value Ref Range Status   Specimen Description BLOOD SITE NOT SPECIFIED  Final   Special Requests   Final    BOTTLES DRAWN AEROBIC AND ANAEROBIC Blood Culture results may not be optimal due to an excessive volume of blood received in culture bottles   Culture   Final    NO GROWTH 5 DAYS Performed at [facility], [address] [place], [place] [address]    Report Status 06/28/2022 FINAL  Final  SARS Coronavirus 2 by RT PCR (hospital order, performed in [facility] hospital lab) *cepheid single result test*  Anterior Nasal Swab     Status: None   Collection Time: 06/25/22  2:26 PM   Specimen: Anterior Nasal Swab  Result Value Ref Range Status   SARS Coronavirus 2 by RT PCR NEGATIVE NEGATIVE Final    Comment: (NOTE) SARS-CoV-2 target nucleic acids are NOT DETECTED.  The SARS-CoV-2 RNA is generally detectable in upper and lower respiratory specimens during the acute phase of infection. The lowest concentration of SARS-CoV-2 viral copies this assay can detect is 250 copies / mL. A negative result does not preclude SARS-CoV-2 infection and should not be used as the sole basis for treatment or other patient management decisions.  A negative result may occur with improper specimen collection / handling, submission of specimen other than nasopharyngeal swab, presence of viral mutation(s) within the areas targeted by this assay, and inadequate number of viral copies (<250 copies / mL). A negative result must be combined with clinical observations, patient history, and epidemiological information.  Fact Sheet for Patients:   RoadLapTop.co.za  Fact Sheet for Healthcare Providers: http://kim-miller.com/  This test is not yet approved or  cleared by the [place] FDA and has been authorized for detection and/or diagnosis of SARS-CoV-2 by FDA under an Emergency Use Authorization (EUA).  This EUA will remain in effect (meaning  this test can be used) for the duration of the COVID-19 declaration under Section 564(b)(1) of the Act, 21 U.S.C. section 360bbb-3(b)(1), unless the authorization is terminated or revoked sooner.  Performed at [facility], [address] [place], [place] [address]   Aerobic/Anaerobic Culture w Gram Stain (surgical/deep wound)     Status: None   Collection Time: 06/28/22 11:53 AM   Specimen: Abscess  Result Value Ref Range Status   Specimen Description ABSCESS  Final   Special Requests LEFT BREAST  Final   Gram  Stain   Final    ABUNDANT WBC PRESENT,BOTH PMN AND MONONUCLEAR ABUNDANT GRAM NEGATIVE RODS    Culture   Final    MODERATE PREVOTELLA SPECIES BETA LACTAMASE POSITIVE MODERATE ACTINOMYCES SPECIES Standardized susceptibility testing for this organism is not available. Performed at [facility], [address] [place], [place] [address]    Report Status 07/04/2022 FINAL  Final  Aerobic/Anaerobic Culture w Gram Stain (surgical/deep wound)     Status: None (Preliminary result)   Collection Time: 07/01/22  9:48 AM   Specimen: PATH Breast other; Tissue  Result Value Ref Range Status   Specimen Description WOUND  Final   Special Requests ABSCESS LEFT BREAST  Final   Gram Stain   Final    FEW GRAM POSITIVE COCCI RARE GRAM POSITIVE RODS RARE WBC PRESENT, PREDOMINANTLY PMN    Culture   Final    NO GROWTH 4 DAYS NO ANAEROBES ISOLATED; CULTURE IN PROGRESS FOR 5 DAYS Performed at [facility], [address] [place], [place] [address]    Report Status PENDING  Incomplete    Studies/Results: No results found.    Assessment/Plan:  INTERVAL HISTORY:  pt to start hydrotherapy Principal Problem:   Cellulitis of chest wall Active Problems:   Hyperglycemia   Hyponatremia   Tobacco abuse   Alcohol abuse   Breast abscess   Hidradenitis suppurativa   Dental infection   Morbid obesity (HCC)   Type 2 diabetes mellitus with hyperglycemia (HCC)   Pyomyositis   Anxiety   Hidradenitis   Hypervolemia    Bridget Hartshornaron L Kromer is a 36 y.o. 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  So far no aerobic  gram-negative rods have grown and no MRSA has been isolated and his PCR was negative for MRSA.  We have narrowed him to Unasyn and daptomycin now we will get to the daptomycin and go with Unasyn with plans to go to Augmentin.  Need protracted therapy with amoxicillin to complete least 6 months given the actinomyces  Need protracted therapy with amoxicillin for his actinomyces infection (ie likely 6 months)   2   Hidradenitis suppurativa: He will need connected to dermatology.  3.  Diabetes mellitus: Would benefit from ozempic or simliar drug I would think  4.  Volume overload hopefully reducing the volume of IV antibiotics and getting will help and we would like to go to po eventually          LOS: 13 days   Acey LavCornelius Van Dam 07/06/2022, 11:31 AM

## 2022-07-06 NOTE — Progress Notes (Signed)
[facility]                                                                              [name], is a 36 y.o. male, DOB - 05-11-1986, NOB:[id number] Admit date - 06/23/2022    Outpatient Primary MD for the patient is Patient, [name]  LOS - 13  days  Chief Complaint  Patient presents with   Abscess       Brief summary    Patient is a 36 year old male with history of obesity, EtOH use comes into the hospital with left chest wall cellulitis and sepsis. He reports initially there was a boil in the area that popped open, and following that the whole area started to get worse.  12/5: Underwent aspiration 12/8, 12/10: Underwent debridement by surgery   Assessment & Plan    Principal Problem: Sepsis due to chest wall cellulitis/abscess, necrotizing soft tissue infection -Ultrasound chest showed possible abscess, IR initially consulted, status post aspiration on 5/5 -General surgery consulted, underwent I&D in OR on 5/8, repeated debridement on 12/10 -Surgery following, no plans to return to work, continue bedside dressing changes and antibiotics -ID consulted, cultures with actinomyces species, on daptomycin and Unasyn, with plans to narrow to Augmentin  -will need prolonged therapy for at least 6 months given the actinomyces. -Has PICC line    Active problems Diabetes mellitus, poorly controlled with hyperglycemia, new diagnosis -Hemoglobin A1c 11.6 on 06/24/2022 -Likely will need insulin for short-term until A1c less than 8 for better healing of the wound.  Patient is agreeable for home insulin -Continue NovoLog 70/30 12 units twice daily with sliding scale insulin -Diabetic coordinator following Recent Labs    07/05/22 0611 07/05/22 1315 07/05/22 1638 07/05/22 2142 07/06/22 0612 07/06/22 1135  GLUCAP 165* 232* 197* 226* 168* 182*    Hyponatremia -Likely due to fluid overload, resolved with Lasix   Mild transaminitis, alk phos -In the setting  of acute illness, improving -AST ALT normal  Microcytic anemia -Currently no overt bleeding, follow anemia panel  Alcohol and tobacco use -Recommended cessation    Alcohol and tobacco use-recommend cessation  Obesity Estimated body mass index is 38.04 kg/m as calculated from the following:   Height as of this encounter: 5' 9.02" (1.753 m).   Weight as of this encounter: 116.9 kg.  Code Status: Full CODE STATUS DVT Prophylaxis:    Level of Care: Level of care: Med-Surg Family Communication: Updated patient   Disposition Plan:      Remains inpatient appropriate: Not medically ready for discharge   Procedures:  I&D x 2  Consultants:   IR Infectious disease Surgery  Antimicrobials:   Anti-infectives (From admission, onward)    Start     Dose/Rate Route Frequency Ordered Stop   07/05/22 1200  Ampicillin-Sulbactam (UNASYN) 3 g in sodium chloride 0.9 % 100 mL IVPB        3 g 200 mL/hr over 30 Minutes Intravenous Every 6 hours 07/05/22 0909     07/04/22 1400  piperacillin-tazobactam (ZOSYN) IVPB 3.375 g  Status:  Discontinued        3.375 g 12.5 mL/hr over  240 Minutes Intravenous Every 8 hours 07/04/22 0957 07/05/22 0909   07/03/22 1600  DAPTOmycin (CUBICIN) 700 mg in sodium chloride 0.9 % IVPB  Status:  Discontinued        8 mg/kg  89.2 kg (Adjusted) 128 mL/hr over 30 Minutes Intravenous Daily 07/03/22 1512 07/06/22 1004   07/03/22 0000  vancomycin (VANCOREADY) IVPB 1250 mg/250 mL  Status:  Discontinued        1,250 mg 166.7 mL/hr over 90 Minutes Intravenous Every 12 hours 07/02/22 1119 07/03/22 1512   07/02/22 1215  vancomycin (VANCOREADY) IVPB 2000 mg/400 mL        2,000 mg 200 mL/hr over 120 Minutes Intravenous  Once 07/02/22 1119 07/03/22 0144   06/29/22 1200  ceFEPIme (MAXIPIME) 2 g in sodium chloride 0.9 % 100 mL IVPB  Status:  Discontinued        2 g 200 mL/hr over 30 Minutes Intravenous Every 8 hours 06/29/22 1005 07/04/22 0957   06/29/22 1100   metroNIDAZOLE (FLAGYL) tablet 500 mg  Status:  Discontinued        500 mg Oral Every 12 hours 06/29/22 1005 07/04/22 0957   06/28/22 1200  Ampicillin-Sulbactam (UNASYN) 3 g in sodium chloride 0.9 % 100 mL IVPB  Status:  Discontinued        3 g 200 mL/hr over 30 Minutes Intravenous Every 6 hours 06/28/22 0911 06/29/22 1005   06/24/22 0600  ceFEPIme (MAXIPIME) 2 g in sodium chloride 0.9 % 100 mL IVPB  Status:  Discontinued        2 g 200 mL/hr over 30 Minutes Intravenous Every 8 hours 06/23/22 2041 06/28/22 0911   06/24/22 0300  vancomycin (VANCOREADY) IVPB 1250 mg/250 mL  Status:  Discontinued        1,250 mg 166.7 mL/hr over 90 Minutes Intravenous Every 12 hours 06/23/22 1543 06/29/22 0819   06/23/22 2100  ceFEPIme (MAXIPIME) 2 g in sodium chloride 0.9 % 100 mL IVPB        2 g 200 mL/hr over 30 Minutes Intravenous  Once 06/23/22 2006 06/23/22 2153   06/23/22 2100  metroNIDAZOLE (FLAGYL) IVPB 500 mg  Status:  Discontinued        500 mg 100 mL/hr over 60 Minutes Intravenous Every 12 hours 06/23/22 2006 06/28/22 0849   06/23/22 1600  vancomycin (VANCOREADY) IVPB 1500 mg/300 mL        1,500 mg 150 mL/hr over 120 Minutes Intravenous  Once 06/23/22 1514 06/23/22 1912   06/23/22 1430  vancomycin (VANCOCIN) IVPB 1000 mg/200 mL premix        1,000 mg 200 mL/hr over 60 Minutes Intravenous  Once 06/23/22 1418 06/23/22 1624   06/23/22 1430  cefTRIAXone (ROCEPHIN) 2 g in sodium chloride 0.9 % 100 mL IVPB        2 g 200 mL/hr over 30 Minutes Intravenous  Once 06/23/22 1418 06/23/22 1506          Medications  Chlorhexidine Gluconate Cloth  6 each Topical Daily   enoxaparin (LOVENOX) injection  55 mg Subcutaneous Q24H   feeding supplement  237 mL Oral BID WC   folic acid  1 mg Oral Daily   insulin aspart  0-15 Units Subcutaneous TID WC   insulin aspart  0-5 Units Subcutaneous QHS   insulin aspart protamine- aspart  12 Units Subcutaneous BID WC   multivitamin with minerals  1 tablet Oral  Daily   nicotine  14 mg Transdermal Daily   nutrition supplement (JUVEN)  1 packet Oral BID BM   polyethylene glycol  17 g Oral Daily   saccharomyces boulardii  250 mg Oral BID   sodium chloride flush  10-40 mL Intracatheter Q12H   thiamine  100 mg Oral Daily      Subjective:   [name] was seen and examined today.  Complaining on pain with the moving and dressing changes.  Otherwise stable, no nausea vomiting or shortness of breath, chest pain.  No fevers  Objective:   Vitals:   07/06/22 0414 07/06/22 0802 07/06/22 1140 07/06/22 1149  BP:  117/71 (!) 143/81 124/75  Pulse: 100 93 75   Resp: 20  17   Temp: 99.8 F (37.7 C) 98.1 F (36.7 C) 97.9 F (36.6 C)   TempSrc: Oral Oral Oral   SpO2: 97%  90%   Weight:      Height:        Intake/Output Summary (Last 24 hours) at 07/06/2022 1338 Last data filed at 07/06/2022 1000 Gross per 24 hour  Intake 539.37 ml  Output 0 ml  Net 539.37 ml     Wt Readings from Last 3 Encounters:  07/03/22 116.9 kg  09/16/16 124.7 kg  08/06/16 120.2 kg     Exam General: Alert and oriented x 3, NAD Cardiovascular: S1 S2 auscultated,  RRR Respiratory: Clear to auscultation bilaterally, no wheezing Gastrointestinal: Soft, dressing intact in the left lower chest and flank area Ext: no pedal edema bilaterally Neuro: Strength 5/5 upper and lower extremities bilaterally Psych: Normal affect and demeanor, alert and oriented x3     Data Reviewed:  I have personally reviewed following labs    CBC Lab Results  Component Value Date   WBC 12.2 (H) 07/06/2022   RBC 3.13 (L) 07/06/2022   HGB 8.5 (L) 07/06/2022   HCT 24.5 (L) 07/06/2022   MCV 78.3 (L) 07/06/2022   MCH 27.2 07/06/2022   PLT 719 (H) 07/06/2022   MCHC 34.7 07/06/2022   RDW 13.8 07/06/2022   LYMPHSABS 2.0 07/02/2022   MONOABS 0.7 07/02/2022   EOSABS 0.1 07/02/2022   BASOSABS 0.1 07/02/2022     Last metabolic panel Lab Results  Component Value Date   NA 135  07/06/2022   K 3.8 07/06/2022   CL 99 07/06/2022   CO2 28 07/06/2022   BUN 6 07/06/2022   CREATININE 0.67 07/06/2022   GLUCOSE 161 (H) 07/06/2022   GFRNONAA >60 07/06/2022   CALCIUM 8.3 (L) 07/06/2022   PROT 7.4 07/06/2022   ALBUMIN 1.6 (L) 07/06/2022   BILITOT 0.3 07/06/2022   ALKPHOS 130 (H) 07/06/2022   AST 32 07/06/2022   ALT 32 07/06/2022   ANIONGAP 8 07/06/2022    CBG (last 3)  Recent Labs    07/05/22 2142 07/06/22 0612 07/06/22 1135  GLUCAP 226* 168* 182*      Coagulation Profile: No results for input(s): "INR", "PROTIME" in the last 168 hours.   Radiology Studies: I have personally reviewed the imaging studies  No results found.     Ripudeep Rai M.D. [facility] 07/06/2022, 1:38 PM  Available via Epic secure chat 7am-7pm After 7 pm, please refer to night coverage provider listed on amion.    

## 2022-07-06 NOTE — Progress Notes (Signed)
Nutrition Follow-up  DOCUMENTATION CODES:   Obesity unspecified  INTERVENTION:   - Continue Ensure Enlive po BID, each supplement provides 350 kcal and 20 grams of protein  - Continue 1 packet Juven BID, each packet provides 95 calories, 2.5 grams of protein, and 9.8 grams of carbohydrate; also contains L-arginine and L-glutamine, vitamin C, vitamin E, vitamin B-12, zinc, calcium, and calcium Beta-hydroxy-Beta-methylbutyrate to support wound healing  - Continue Magic Cup BID with meals, each supplement provides 290 kcal and 9 grams of protein  - Continue MVI with minerals, thiamine, folic acid  NUTRITION DIAGNOSIS:   Inadequate oral intake related to decreased appetite, nausea as evidenced by per patient/family report.  Progressing, pt reports eating well at meals and taking supplements  GOAL:   Patient will meet greater than or equal to 90% of their needs  Progressing  MONITOR:   PO intake, Supplement acceptance, Labs, Weight trends, I & O's  REASON FOR ASSESSMENT:   Consult Diet education  ASSESSMENT:   36 year old male with PMHx of asthma who is admitted with left chest wall cellulitis, sepsis, hyperglycemia with newly diagnosed diabetes mellitus, hyponatremia, and also with history of alcohol and tobacco use.  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  Spoke with pt at bedside. He reports that he is eating well and has a good appetite. He feels like he is getting stronger. Pt shares that he has been trying not to overdo it on carbohydrate foods which is why he isn't drinking his Ensure supplement every time. Discussed with pt that overall his blood sugars are better. Encouraged all PO intake and explained that insulin doses can be adjusted as needed. Discussed pt's increased kcal and protein needs related to acute illness and wound  healing. Discussed sources of protein. Pt willing to continue consume Ensure, Juven, and Magic Cups at this time. All questions answered.  Pt with moderate pitting edema to BLE, non-pitting edema to RUE, and mild pitting edema to LUE.  Meal Completion: 15-100%  Medications reviewed and include: Ensure Enlive BID, folic acid, SSI, novolog mix 70/30 12 units BID, MVI with minerals, Juven BID, miralax, florastor, thiamine, IV abx  Labs reviewed: WBC 12.2, hemoglobin 8.5 CBG's: 168-226 x 24 hours  NUTRITION - FOCUSED PHYSICAL EXAM:  Flowsheet Row Most Recent Value  Orbital Region No depletion  Upper Arm Region No depletion  Thoracic and Lumbar Region No depletion  Buccal Region No depletion  Temple Region No depletion  Clavicle Bone Region No depletion  Clavicle and Acromion Bone Region No depletion  Scapular Bone Region No depletion  Dorsal Hand No depletion  Patellar Region No depletion  Anterior Thigh Region No depletion  Posterior Calf Region No depletion       Diet Order:   Diet Order             Diet Carb Modified Fluid consistency: Thin; Room service appropriate? Yes  Diet effective now                   EDUCATION NEEDS:   Education needs have been addressed  Skin:  Skin Assessment: Skin Integrity Issues: Other: open wound to L chest s/p I&D  Last BM:  07/05/22  Height:   Ht Readings from Last 1 Encounters:  07/03/22 5' 9.02" (1.753 m)    Weight:   Wt  Readings from Last 1 Encounters:  07/03/22 116.9 kg    Ideal Body Weight:  72.7 kg  BMI:  Body mass index is 38.04 kg/m.  Estimated Nutritional Needs:   Kcal:  2200-2400  Protein:  110-120 grams  Fluid:  2.2-2.4 L/day    Mertie Clause, MS, RD, LDN Inpatient Clinical Dietitian Please see AMiON for contact information.

## 2022-07-06 NOTE — Plan of Care (Signed)
  Problem: Education: Goal: Ability to describe self-care measures that may prevent or decrease complications (Diabetes Survival Skills Education) will improve Outcome: Progressing Goal: Individualized Educational Video(s) Outcome: Progressing   Problem: Coping: Goal: Ability to adjust to condition or change in health will improve Outcome: Progressing   Problem: Metabolic: Goal: Ability to maintain appropriate glucose levels will improve Outcome: Progressing   Problem: Nutritional: Goal: Maintenance of adequate nutrition will improve Outcome: Progressing

## 2022-07-06 NOTE — Progress Notes (Signed)
Pt had refused dressing change at 2000 on 12/12. Pt teaching was given about the importance of BID dressing change and the potential risks of skipping his dressing change for the night. Pt requested that I contact on call MD for clarification on what time he should have his dressing change done. TRH Dr. Johny Drilling stated that there was nothing he could do and that the primary MD should have a conversation about the timing of his BID dressing changes. After relaying the MD message to the pt and re-educating pt about the benefits of the dressing change and possible complications, the pt still refused stating that he cannot bear with the pain for the night. Charge RN was notified about pt refusal. Pain meds was given to pt at his request and states he would speak with the primary MD in the AM.    07/05/22 2000  Treat  Pain Type Acute pain;Surgical pain  Pain Location Chest  Pain Orientation Left  Pain Descriptors / Indicators Aching  Notify: Charge Nurse/RN  Name of Charge Nurse/RN Notified Rocco Pauls, RN  Date Charge Nurse/RN Notified 07/05/22  Time Charge Nurse/RN Notified 2010  Provider Notification  Provider Name/Title Dr. Johny Drilling  Date Provider Notified 07/05/22  Time Provider Notified 2000  Method of Notification Page  Notification Reason Requested by patient/family (pt refusing dressing change)  Provider response No new orders  Date of Provider Response 07/05/22  Time of Provider Response 2000

## 2022-07-06 NOTE — TOC Progression Note (Signed)
Transition of Care [facility]) - Progression Note    Patient Details  Name: [name] MRN: [id number] Date of Birth: 07-30-1985  [facility] CM/SW Contact  Leone Haven, RN Phone Number: 07/06/2022, 4:31 PM  Clinical Narrative:    Patient with left chest wall cellulitis and necrotizing soft tissue infection, on cubucin and unasyn per MD note plan is to narrow to to po augmentin  for 6 months.  TOC following.        Expected Discharge Plan and Services                                                 Social Determinants of Health (SDOH) Interventions    Readmission Risk Interventions     No data to display

## 2022-07-06 NOTE — Progress Notes (Signed)
3 Days Post-Op  Subjective: CC: Patient having a lot of pain with dressing changes. Declined pm dressing change yesterday per RN. Agreeable to dressing change this am after pre-medication.   Afebrile overnight. Stable tachycardia this am in the low 100's. No hypotension. WBC down to 12.2 this am. Cx's from 12/5 with Prevotella species, beta lactamase positive, moderate actinomyces. Cx from 12/8 pending.   Objective: Vital signs in last 24 hours: Temp:  [98.4 F (36.9 C)-99.8 F (37.7 C)] 99.8 F (37.7 C) (12/13 0414) Pulse Rate:  [100-101] 100 (12/13 0414) Resp:  [20] 20 (12/13 0414) BP: (113-139)/(68-81) 128/75 (12/13 0009) SpO2:  [92 %-97 %] 97 % (12/13 0414) Last BM Date : 07/05/22  Intake/Output from previous day: 12/12 0701 - 12/13 0700 In: 289.4 [IV Piggyback:289.4] Out: 0  Intake/Output this shift: No intake/output data recorded.  PE: Gen:  Alert, NAD, pleasant.  Wound: Left breast/chest wall. Stable from pictures yesterday. 30cm x 9cm x 4cm deep with mixed granulation tissue, fibrinous tissue (more central posterior) and some cauterized tissue. There an area near the anterior border of the wound that tracks ~4cm cephalad and anteriorly - no purulent drainage from here today. Otherwise no drainage or tracking/tunneling of the wound. Induration and erythema along the left flank and lateral abdomen are stable/improved from 12/9. Counter incisions x 2 clean without drainage.   Lab Results:  Recent Labs    07/04/22 1627 07/05/22 1143  WBC 19.9* 17.6*  HGB 9.4* 9.5*  HCT 27.4* 26.1*  PLT 765* 736*    BMET Recent Labs    07/04/22 1627 07/05/22 1457  NA 135 135  K 4.0 3.6  CL 97* 96*  CO2 27 29  GLUCOSE 163* 199*  BUN 10 9  CREATININE 0.78 0.68  CALCIUM 8.3* 8.5*    PT/INR No results for input(s): "LABPROT", "INR" in the last 72 hours. CMP     Component Value Date/Time   NA 135 07/05/2022 1457   K 3.6 07/05/2022 1457   CL 96 (L) 07/05/2022 1457    CO2 29 07/05/2022 1457   GLUCOSE 199 (H) 07/05/2022 1457   BUN 9 07/05/2022 1457   CREATININE 0.68 07/05/2022 1457   CALCIUM 8.5 (L) 07/05/2022 1457   PROT 7.7 07/05/2022 1457   ALBUMIN 1.6 (L) 07/05/2022 1457   AST 30 07/05/2022 1457   ALT 32 07/05/2022 1457   ALKPHOS 155 (H) 07/05/2022 1457   BILITOT 0.4 07/05/2022 1457   GFRNONAA >60 07/05/2022 1457   Lipase  No results found for: "LIPASE"  Studies/Results: [place] EKG SITE RITE  Result Date: 07/04/2022 If Site Rite image not attached, placement could not be confirmed due to current cardiac rhythm.   Anti-infectives: Anti-infectives (From admission, onward)    Start     Dose/Rate Route Frequency Ordered Stop   07/05/22 1200  Ampicillin-Sulbactam (UNASYN) 3 g in sodium chloride 0.9 % 100 mL IVPB        3 g 200 mL/hr over 30 Minutes Intravenous Every 6 hours 07/05/22 0909     07/04/22 1400  piperacillin-tazobactam (ZOSYN) IVPB 3.375 g  Status:  Discontinued        3.375 g 12.5 mL/hr over 240 Minutes Intravenous Every 8 hours 07/04/22 0957 07/05/22 0909   07/03/22 1600  DAPTOmycin (CUBICIN) 700 mg in sodium chloride 0.9 % IVPB        8 mg/kg  89.2 kg (Adjusted) 128 mL/hr over 30 Minutes Intravenous Daily 07/03/22 1512     07/03/22  0000  vancomycin (VANCOREADY) IVPB 1250 mg/250 mL  Status:  Discontinued        1,250 mg 166.7 mL/hr over 90 Minutes Intravenous Every 12 hours 07/02/22 1119 07/03/22 1512   07/02/22 1215  vancomycin (VANCOREADY) IVPB 2000 mg/400 mL        2,000 mg 200 mL/hr over 120 Minutes Intravenous  Once 07/02/22 1119 07/03/22 0144   06/29/22 1200  ceFEPIme (MAXIPIME) 2 g in sodium chloride 0.9 % 100 mL IVPB  Status:  Discontinued        2 g 200 mL/hr over 30 Minutes Intravenous Every 8 hours 06/29/22 1005 07/04/22 0957   06/29/22 1100  metroNIDAZOLE (FLAGYL) tablet 500 mg  Status:  Discontinued        500 mg Oral Every 12 hours 06/29/22 1005 07/04/22 0957   06/28/22 1200  Ampicillin-Sulbactam (UNASYN) 3 g in  sodium chloride 0.9 % 100 mL IVPB  Status:  Discontinued        3 g 200 mL/hr over 30 Minutes Intravenous Every 6 hours 06/28/22 0911 06/29/22 1005   06/24/22 0600  ceFEPIme (MAXIPIME) 2 g in sodium chloride 0.9 % 100 mL IVPB  Status:  Discontinued        2 g 200 mL/hr over 30 Minutes Intravenous Every 8 hours 06/23/22 2041 06/28/22 0911   06/24/22 0300  vancomycin (VANCOREADY) IVPB 1250 mg/250 mL  Status:  Discontinued        1,250 mg 166.7 mL/hr over 90 Minutes Intravenous Every 12 hours 06/23/22 1543 06/29/22 0819   06/23/22 2100  ceFEPIme (MAXIPIME) 2 g in sodium chloride 0.9 % 100 mL IVPB        2 g 200 mL/hr over 30 Minutes Intravenous  Once 06/23/22 2006 06/23/22 2153   06/23/22 2100  metroNIDAZOLE (FLAGYL) IVPB 500 mg  Status:  Discontinued        500 mg 100 mL/hr over 60 Minutes Intravenous Every 12 hours 06/23/22 2006 06/28/22 0849   06/23/22 1600  vancomycin (VANCOREADY) IVPB 1500 mg/300 mL        1,500 mg 150 mL/hr over 120 Minutes Intravenous  Once 06/23/22 1514 06/23/22 1912   06/23/22 1430  vancomycin (VANCOCIN) IVPB 1000 mg/200 mL premix        1,000 mg 200 mL/hr over 60 Minutes Intravenous  Once 06/23/22 1418 06/23/22 1624   06/23/22 1430  cefTRIAXone (ROCEPHIN) 2 g in sodium chloride 0.9 % 100 mL IVPB        2 g 200 mL/hr over 30 Minutes Intravenous  Once 06/23/22 1418 06/23/22 1506        Assessment/Plan POD 5/3 s/p I&D of 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 Carolynne Edouard and Dr. Janee Morn on 07/01/22 and 07/03/22 respectively  - Wound debridement left chest wall necrotizing soft tissue infection now measures 30cm x 9cm x 4cm deep, incision and drainage left flank x 2  - Cont abx. Cx's from 12/5 (IR) with Prevotella species, beta lactamase positive, moderate actinomyces. Cx from 12/8 pending. Abx selection per ID.  - WTD dressing changes BID. We will see the AM dressing change with staff so we can evaluate the wound.  - Add hydrotherapy  - No plans to  return to the OR at this time. Continue bedside dressing changes and abx.    FEN: CM diet VTE: SCDs, LMWH ID: Unasyn/Daptomycin per ID   - below per TRH -  T2DM, uncontrolled - A1c 11.6, SSI Alcohol abuse Tobacco abuse Obesity class II  Hyponatremia - s/p lasix   LOS: 13 days    Jacinto Halim , [facility] Surgery 07/06/2022, 7:23 AM Please see Amion for pager number during day hours 7:00am-4:30pm

## 2022-07-07 LAB — AEROBIC/ANAEROBIC CULTURE W GRAM STAIN (SURGICAL/DEEP WOUND)

## 2022-07-07 LAB — GLUCOSE, CAPILLARY
Glucose-Capillary: 185 mg/dL — ABNORMAL HIGH (ref 70–99)
Glucose-Capillary: 214 mg/dL — ABNORMAL HIGH (ref 70–99)
Glucose-Capillary: 187 mg/dL — ABNORMAL HIGH (ref 70–99)
Glucose-Capillary: 157 mg/dL — ABNORMAL HIGH (ref 70–99)

## 2022-07-07 LAB — CBC
HCT: 24.1 % — ABNORMAL LOW (ref 39.0–52.0)
Hemoglobin: 8.5 g/dL — ABNORMAL LOW (ref 13.0–17.0)
MCH: 27.8 pg (ref 26.0–34.0)
MCHC: 35.3 g/dL (ref 30.0–36.0)
MCV: 78.8 fL — ABNORMAL LOW (ref 80.0–100.0)
Platelets: 710 10*3/uL — ABNORMAL HIGH (ref 150–400)
RBC: 3.06 MIL/uL — ABNORMAL LOW (ref 4.22–5.81)
RDW: 13.9 % (ref 11.5–15.5)
WBC: 9.7 10*3/uL (ref 4.0–10.5)
nRBC: 0 % (ref 0.0–0.2)

## 2022-07-07 MED ORDER — MEDIHONEY WOUND/BURN DRESSING EX PSTE
1.0000 | PASTE | Freq: Every day | CUTANEOUS | Status: DC
  Administered 2022-07-08 – 2022-07-13 (×4): 1 via TOPICAL
  Filled 2022-07-07: qty 44

## 2022-07-07 MED ORDER — INSULIN ASPART PROT & ASPART (70-30 MIX) 100 UNIT/ML ~~LOC~~ SUSP
15.0000 [IU] | Freq: Two times a day (BID) | SUBCUTANEOUS | Status: DC
  Administered 2022-07-07 – 2022-07-13 (×12): 15 [IU] via SUBCUTANEOUS
  Filled 2022-07-07: qty 10

## 2022-07-07 MED ORDER — HYDROMORPHONE HCL 1 MG/ML IJ SOLN
1.0000 mg | INTRAMUSCULAR | Status: DC | PRN
  Administered 2022-07-08 – 2022-07-12 (×2): 1 mg via INTRAVENOUS
  Filled 2022-07-07 (×2): qty 1

## 2022-07-07 MED ORDER — AMOXICILLIN-POT CLAVULANATE 875-125 MG PO TABS
1.0000 | ORAL_TABLET | Freq: Two times a day (BID) | ORAL | Status: DC
  Administered 2022-07-07 – 2022-07-13 (×13): 1 via ORAL
  Filled 2022-07-07 (×13): qty 1

## 2022-07-07 MED ORDER — AMOXICILLIN 500 MG PO CAPS: 500.0000 mg | ORAL_CAPSULE | Freq: Three times a day (TID) | ORAL | Status: DC

## 2022-07-07 NOTE — Plan of Care (Signed)

## 2022-07-07 NOTE — Progress Notes (Signed)
Physical Therapy Wound Treatment Patient Details  Name: [name] MRN: [id number] Date of Birth: Apr 04, 1986  Today's Date: 07/07/2022 Time: 6213-0865 Time Calculation (min): 78 min  Subjective  Subjective Assessment Patient and Family Stated Goals: get back to his son Date of Onset:  (November 2023) Prior Treatments: surgical debridement  Pain Score:  4/10 Pt received both IV and oral pain medication which provided better pain management during session   Wound Assessment  Wound / Incision (Open or Dehisced) 06/28/22 Incision - Open;Incision - Dehisced Left;Upper (Active)  Wound Image   07/07/22 1600  Dressing Type Gauze (Comment);ABD;Barrier Film (skin prep);Moist to moist;Normal saline moist dressing 07/07/22 1600  Dressing Changed Changed 07/07/22 1600  Dressing Status New drainage 07/07/22 1600  Dressing Change Frequency Twice a day 07/07/22 1600  Site / Wound Assessment Granulation tissue;Yellow;Red;Pink;Black;Painful 07/07/22 1600  % Wound base Red or Granulating 70% 07/07/22 1600  % Wound base Yellow/Fibrinous Exudate 30% 07/07/22 1600  Peri-wound Assessment Induration;Erythema (blanchable) 07/07/22 1600  Wound Length (cm) 9 cm 07/07/22 1600  Wound Width (cm) 30 cm 07/07/22 1600  Wound Depth (cm) 4 cm 07/07/22 1600  Wound Volume (cm^3) 1080 cm^3 07/07/22 1600  Wound Surface Area (cm^2) 270 cm^2 07/07/22 1600  Margins Unattached edges (unapproximated) 07/07/22 1600  Closure None 07/07/22 1600  Drainage Amount Moderate 07/07/22 1600  Drainage Description Serous 07/07/22 1600  Non-staged Wound Description Not applicable 78/46/96 2952  Treatment Debridement (Selective);Irrigation;Packing (Saline gauze) 07/07/22 1600     Incision (Closed) 07/03/22 Breast Left (Active)  Dressing Type None 07/06/22 1950  Dressing New drainage 07/06/22 1950  Dressing Change Frequency Twice a day 07/06/22 1950  Site / Wound Assessment Clean;Dry 07/06/22 1950  Drainage Amount None 07/05/22  1954   Selective Debridement (non-excisional) Selective Debridement (non-excisional) - Location: posterior to central part of the incision Selective Debridement (non-excisional) - Tools Used: Forceps, Scalpel Selective Debridement (non-excisional) - Tissue Removed: yellowish, fibrin slough    Wound Assessment and Plan  Wound Therapy - Assess/Plan/Recommendations Wound Therapy - Clinical Statement: Pt given IV Morphine and oral Hydrocodone prior to treatment. Pt and PT feel the short and long acting combination of pain medication worked well for pain management both during treatment and after wound packed. Pt with significant amount of sloughy fibrin tissue in the base of the wound. Surgical PA in room during session and agrees with implimentation of pulsitile lavage and addition of Medihoney be added to treatment plan. Continued treatment to reduce bioburden and work towards wound vac placement. Will return tomorrow for treatment Wound Therapy - Functional Problem List: Newly diagnosed diabetes Factors Delaying/Impairing Wound Healing: Diabetes Mellitus Hydrotherapy Plan: Debridement, Dressing change, Patient/family education, Pulsatile lavage with suction Wound Therapy - Frequency:  (2x/wk) Wound Therapy - Follow Up Recommendations: dressing changes by RN  Wound Therapy Goals- Improve the function of patient's integumentary system by progressing the wound(s) through the phases of wound healing (inflammation - proliferation - remodeling) by: Wound Therapy Goals - Improve the function of patient's integumentary system by progressing the wound(s) through the phases of wound healing by: Decrease Necrotic Tissue to: 10 Decrease Necrotic Tissue - Progress: Goal set today Improve Drainage Characteristics: Min Improve Drainage Characteristics - Progress: Goal set today Goals/treatment plan/discharge plan were made with and agreed upon by patient/family: Yes Time For Goal Achievement: 7 days Wound  Therapy - Potential for Goals: Excellent  Goals will be updated until maximal potential achieved or discharge criteria met.  Discharge criteria: when goals achieved, discharge from hospital,  MD decision/surgical intervention, no progress towards goals, refusal/missing three consecutive treatments without notification or medical reason.  GP     Charges PT Wound Care Charges $Wound Debridement up to 20 cm: < or equal to 20 cm $ Wound Debridement each add'l 20 sqcm: 3 $PT Hydrotherapy Visit: [place] 07/07/2022, 4:37 PM

## 2022-07-07 NOTE — Progress Notes (Signed)
[facility]                                                                              [name], is a 36 y.o. male, DOB - 1986-07-25, FBP:[id number] Admit date - 06/23/2022    Outpatient Primary MD for the patient is Patient, [name]  LOS - 14  days  Chief Complaint  Patient presents with   Abscess       Brief summary    Patient is a 36 year old male with history of obesity, EtOH use comes into the hospital with left chest wall cellulitis and sepsis. He reports initially there was a boil in the area that popped open, and following that the whole area started to get worse.  12/5: Underwent aspiration 12/8, 12/10: Underwent debridement by surgery   Assessment & Plan    Principal Problem: Sepsis due to chest wall cellulitis/abscess, necrotizing soft tissue infection -Ultrasound chest showed possible abscess, IR initially consulted, status post aspiration on 5/5 -General surgery consulted, underwent I&D in OR on 5/8, repeated debridement on 12/10 -Surgery following, no plans to return to work, continue bedside dressing changes and antibiotics -ID consulted, cultures with actinomyces species, on daptomycin and Unasyn, now narrowed to Augmentin.   -will need prolonged therapy for at least 6 months given the actinomyces. -Has PICC line    Active problems Diabetes mellitus, poorly controlled with hyperglycemia, new diagnosis -Hemoglobin A1c 11.6 on 06/24/2022 -Likely will need insulin for short-term until A1c <8 for better healing of the wound.  Patient is agreeable for home insulin -CBGs elevated, increase NovoLog 70/30 to 15 units twice daily, continue SSI  Recent Labs    07/06/22 1135 07/06/22 1609 07/06/22 2142 07/06/22 2331 07/07/22 0618 07/07/22 1240  GLUCAP 182* 184* 251* 230* 185* 214*     Hyponatremia -Likely due to fluid overload, resolved with Lasix   Mild transaminitis, alk phos -In the setting of acute illness, improving -AST ALT  normal  Microcytic anemia -H&H stable, 8.5  Alcohol and tobacco use -Recommended cessation    Alcohol and tobacco use-recommend cessation  Obesity Estimated body mass index is 38.04 kg/m as calculated from the following:   Height as of this encounter: 5' 9.02" (1.753 m).   Weight as of this encounter: 116.9 kg.  Code Status: Full CODE STATUS DVT Prophylaxis:    Level of Care: Level of care: Med-Surg Family Communication: Updated patient   Disposition Plan:      Remains inpatient appropriate: Not medically ready for discharge   Procedures:  I&D x 2  Consultants:   IR Infectious disease Surgery  Antimicrobials:   Anti-infectives (From admission, onward)    Start     Dose/Rate Route Frequency Ordered Stop   08/11/22 0600  amoxicillin (AMOXIL) capsule 500 mg        500 mg Oral Every 8 hours 07/07/22 1135     07/07/22 1200  amoxicillin-clavulanate (AUGMENTIN) 875-125 MG per tablet 1 tablet        1 tablet Oral Every 12 hours 07/07/22 0913 08/10/22 2359   07/05/22 1200  Ampicillin-Sulbactam (UNASYN) 3 g in sodium chloride 0.9 % 100  mL IVPB  Status:  Discontinued        3 g 200 mL/hr over 30 Minutes Intravenous Every 6 hours 07/05/22 0909 07/07/22 0913   07/04/22 1400  piperacillin-tazobactam (ZOSYN) IVPB 3.375 g  Status:  Discontinued        3.375 g 12.5 mL/hr over 240 Minutes Intravenous Every 8 hours 07/04/22 0957 07/05/22 0909   07/03/22 1600  DAPTOmycin (CUBICIN) 700 mg in sodium chloride 0.9 % IVPB  Status:  Discontinued        8 mg/kg  89.2 kg (Adjusted) 128 mL/hr over 30 Minutes Intravenous Daily 07/03/22 1512 07/06/22 1004   07/03/22 0000  vancomycin (VANCOREADY) IVPB 1250 mg/250 mL  Status:  Discontinued        1,250 mg 166.7 mL/hr over 90 Minutes Intravenous Every 12 hours 07/02/22 1119 07/03/22 1512   07/02/22 1215  vancomycin (VANCOREADY) IVPB 2000 mg/400 mL        2,000 mg 200 mL/hr over 120 Minutes Intravenous  Once 07/02/22 1119 07/03/22 0144    06/29/22 1200  ceFEPIme (MAXIPIME) 2 g in sodium chloride 0.9 % 100 mL IVPB  Status:  Discontinued        2 g 200 mL/hr over 30 Minutes Intravenous Every 8 hours 06/29/22 1005 07/04/22 0957   06/29/22 1100  metroNIDAZOLE (FLAGYL) tablet 500 mg  Status:  Discontinued        500 mg Oral Every 12 hours 06/29/22 1005 07/04/22 0957   06/28/22 1200  Ampicillin-Sulbactam (UNASYN) 3 g in sodium chloride 0.9 % 100 mL IVPB  Status:  Discontinued        3 g 200 mL/hr over 30 Minutes Intravenous Every 6 hours 06/28/22 0911 06/29/22 1005   06/24/22 0600  ceFEPIme (MAXIPIME) 2 g in sodium chloride 0.9 % 100 mL IVPB  Status:  Discontinued        2 g 200 mL/hr over 30 Minutes Intravenous Every 8 hours 06/23/22 2041 06/28/22 0911   06/24/22 0300  vancomycin (VANCOREADY) IVPB 1250 mg/250 mL  Status:  Discontinued        1,250 mg 166.7 mL/hr over 90 Minutes Intravenous Every 12 hours 06/23/22 1543 06/29/22 0819   06/23/22 2100  ceFEPIme (MAXIPIME) 2 g in sodium chloride 0.9 % 100 mL IVPB        2 g 200 mL/hr over 30 Minutes Intravenous  Once 06/23/22 2006 06/23/22 2153   06/23/22 2100  metroNIDAZOLE (FLAGYL) IVPB 500 mg  Status:  Discontinued        500 mg 100 mL/hr over 60 Minutes Intravenous Every 12 hours 06/23/22 2006 06/28/22 0849   06/23/22 1600  vancomycin (VANCOREADY) IVPB 1500 mg/300 mL        1,500 mg 150 mL/hr over 120 Minutes Intravenous  Once 06/23/22 1514 06/23/22 1912   06/23/22 1430  vancomycin (VANCOCIN) IVPB 1000 mg/200 mL premix        1,000 mg 200 mL/hr over 60 Minutes Intravenous  Once 06/23/22 1418 06/23/22 1624   06/23/22 1430  cefTRIAXone (ROCEPHIN) 2 g in sodium chloride 0.9 % 100 mL IVPB        2 g 200 mL/hr over 30 Minutes Intravenous  Once 06/23/22 1418 06/23/22 1506          Medications  [START ON 08/11/2022] amoxicillin  500 mg Oral Q8H   amoxicillin-clavulanate  1 tablet Oral Q12H   Chlorhexidine Gluconate Cloth  6 each Topical Daily   enoxaparin (LOVENOX) injection   55 mg Subcutaneous Q24H  feeding supplement  237 mL Oral BID WC   folic acid  1 mg Oral Daily   insulin aspart  0-15 Units Subcutaneous TID WC   insulin aspart  0-5 Units Subcutaneous QHS   insulin aspart protamine- aspart  12 Units Subcutaneous BID WC   multivitamin with minerals  1 tablet Oral Daily   nicotine  14 mg Transdermal Daily   nutrition supplement (JUVEN)  1 packet Oral BID BM   polyethylene glycol  17 g Oral Daily   saccharomyces boulardii  250 mg Oral BID   sodium chloride flush  10-40 mL Intracatheter Q12H   thiamine  100 mg Oral Daily      Subjective:   [name] was seen and examined today.  Sitting up in the chair, reports that has significant pain during the dressing changes otherwise stable.  No nausea or vomiting, fevers.  Objective:   Vitals:   07/07/22 0235 07/07/22 0842 07/07/22 0850 07/07/22 1150  BP:  114/71    Pulse: 96 (!) 114 88 61  Resp: 18 14    Temp: 98.1 F (36.7 C) 98.1 F (36.7 C)  98.6 F (37 C)  TempSrc: Oral Oral  Oral  SpO2: 96% 100%  98%  Weight:      Height:        Intake/Output Summary (Last 24 hours) at 07/07/2022 1312 Last data filed at 07/07/2022 0307 Gross per 24 hour  Intake 400 ml  Output --  Net 400 ml     Wt Readings from Last 3 Encounters:  07/03/22 116.9 kg  09/16/16 124.7 kg  08/06/16 120.2 kg    Physical Exam General: Alert and oriented x 3, NAD Cardiovascular: S1 S2 clear, RRR.  Respiratory: CTAB, no wheezing.  Gastrointestinal: Soft, nontender, nondistended, NBS Ext: no pedal edema bilaterally Neuro: no new deficits Skin: Dressing intact on the lower left chest wall and flank area Psych: Normal affect     Data Reviewed:  I have personally reviewed following labs    CBC Lab Results  Component Value Date   WBC 9.7 07/07/2022   RBC 3.06 (L) 07/07/2022   HGB 8.5 (L) 07/07/2022   HCT 24.1 (L) 07/07/2022   MCV 78.8 (L) 07/07/2022   MCH 27.8 07/07/2022   PLT 710 (H) 07/07/2022   MCHC  35.3 07/07/2022   RDW 13.9 07/07/2022   LYMPHSABS 2.0 07/02/2022   MONOABS 0.7 07/02/2022   EOSABS 0.1 07/02/2022   BASOSABS 0.1 29/24/4628     Last metabolic panel Lab Results  Component Value Date   NA 135 07/06/2022   K 3.8 07/06/2022   CL 99 07/06/2022   CO2 28 07/06/2022   BUN 6 07/06/2022   CREATININE 0.67 07/06/2022   GLUCOSE 161 (H) 07/06/2022   GFRNONAA >60 07/06/2022   CALCIUM 8.3 (L) 07/06/2022   PROT 7.4 07/06/2022   ALBUMIN 1.6 (L) 07/06/2022   BILITOT 0.3 07/06/2022   ALKPHOS 130 (H) 07/06/2022   AST 32 07/06/2022   ALT 32 07/06/2022   ANIONGAP 8 07/06/2022    CBG (last 3)  Recent Labs    07/06/22 2331 07/07/22 0618 07/07/22 1240  GLUCAP 230* 185* 214*      Coagulation Profile: No results for input(s): "INR", "PROTIME" in the last 168 hours.   Radiology Studies: I have personally reviewed the imaging studies  No results found.     Estill Cotta M.D. [facility] 07/07/2022, 1:12 PM  Available via Epic secure chat 7am-7pm After 7 pm, please  refer to night coverage provider listed on amion.

## 2022-07-07 NOTE — Progress Notes (Addendum)
He is complaining with pain with changing in dressings and the extensive packing that is involved and questions of a different type of wound care could be involved.  I wonder if instead he just needs better pain control with his dressing changes.    Antibiotics:  Anti-infectives (From admission, onward)    Start     Dose/Rate Route Frequency Ordered Stop   07/07/22 1200  amoxicillin-clavulanate (AUGMENTIN) 875-125 MG per tablet 1 tablet        1 tablet Oral Every 12 hours 07/07/22 0913     07/05/22 1200  Ampicillin-Sulbactam (UNASYN) 3 g in sodium chloride 0.9 % 100 mL IVPB  Status:  Discontinued        3 g 200 mL/hr over 30 Minutes Intravenous Every 6 hours 07/05/22 0909 07/07/22 0913   07/04/22 1400  piperacillin-tazobactam (ZOSYN) IVPB 3.375 g  Status:  Discontinued        3.375 g 12.5 mL/hr over 240 Minutes Intravenous Every 8 hours 07/04/22 0957 07/05/22 0909   07/03/22 1600  DAPTOmycin (CUBICIN) 700 mg in sodium chloride 0.9 % IVPB  Status:  Discontinued        8 mg/kg  89.2 kg (Adjusted) 128 mL/hr over 30 Minutes Intravenous Daily 07/03/22 1512 07/06/22 1004   07/03/22 0000  vancomycin (VANCOREADY) IVPB 1250 mg/250 mL  Status:  Discontinued        1,250 mg 166.7 mL/hr over 90 Minutes Intravenous Every 12 hours 07/02/22 1119 07/03/22 1512   07/02/22 1215  vancomycin (VANCOREADY) IVPB 2000 mg/400 mL        2,000 mg 200 mL/hr over 120 Minutes Intravenous  Once 07/02/22 1119 07/03/22 0144   06/29/22 1200  ceFEPIme (MAXIPIME) 2 g in sodium chloride 0.9 % 100 mL IVPB  Status:  Discontinued        2 g 200 mL/hr over 30 Minutes Intravenous Every 8 hours 06/29/22 1005 07/04/22 0957   06/29/22 1100  metroNIDAZOLE (FLAGYL) tablet 500 mg  Status:  Discontinued        500 mg Oral Every 12 hours 06/29/22 1005 07/04/22 0957   06/28/22 1200  Ampicillin-Sulbactam (UNASYN) 3 g in sodium chloride 0.9 % 100 mL IVPB  Status:  Discontinued        3 g 200 mL/hr over 30 Minutes  Intravenous Every 6 hours 06/28/22 0911 06/29/22 1005   06/24/22 0600  ceFEPIme (MAXIPIME) 2 g in sodium chloride 0.9 % 100 mL IVPB  Status:  Discontinued        2 g 200 mL/hr over 30 Minutes Intravenous Every 8 hours 06/23/22 2041 06/28/22 0911   06/24/22 0300  vancomycin (VANCOREADY) IVPB 1250 mg/250 mL  Status:  Discontinued        1,250 mg 166.7 mL/hr over 90 Minutes Intravenous Every 12 hours 06/23/22 1543 06/29/22 0819   06/23/22 2100  ceFEPIme (MAXIPIME) 2 g in sodium chloride 0.9 % 100 mL IVPB        2 g 200 mL/hr over 30 Minutes Intravenous  Once 06/23/22 2006 06/23/22 2153   06/23/22 2100  metroNIDAZOLE (FLAGYL) IVPB 500 mg  Status:  Discontinued        500 mg 100 mL/hr over 60 Minutes Intravenous Every 12 hours 06/23/22 2006 06/28/22 0849   06/23/22 1600  vancomycin (VANCOREADY) IVPB 1500 mg/300 mL        1,500 mg 150 mL/hr over 120 Minutes Intravenous  Once 06/23/22 1514 06/23/22 1912  06/23/22 1430  vancomycin (VANCOCIN) IVPB 1000 mg/200 mL premix        1,000 mg 200 mL/hr over 60 Minutes Intravenous  Once 06/23/22 1418 06/23/22 1624   06/23/22 1430  cefTRIAXone (ROCEPHIN) 2 g in sodium chloride 0.9 % 100 mL IVPB        2 g 200 mL/hr over 30 Minutes Intravenous  Once 06/23/22 1418 06/23/22 1506       Medications: Scheduled Meds:  amoxicillin-clavulanate  1 tablet Oral Q12H   Chlorhexidine Gluconate Cloth  6 each Topical Daily   enoxaparin (LOVENOX) injection  55 mg Subcutaneous Q24H   feeding supplement  237 mL Oral BID WC   folic acid  1 mg Oral Daily   insulin aspart  0-15 Units Subcutaneous TID WC   insulin aspart  0-5 Units Subcutaneous QHS   insulin aspart protamine- aspart  12 Units Subcutaneous BID WC   multivitamin with minerals  1 tablet Oral Daily   nicotine  14 mg Transdermal Daily   nutrition supplement (JUVEN)  1 packet Oral BID BM   polyethylene glycol  17 g Oral Daily   saccharomyces boulardii  250 mg Oral BID   sodium chloride flush  10-40 mL  Intracatheter Q12H   thiamine  100 mg Oral Daily   Continuous Infusions:   PRN Meds:.acetaminophen **OR** [DISCONTINUED] acetaminophen, clonazePAM, HYDROmorphone (DILAUDID) injection, ibuprofen, metoprolol tartrate, morphine injection, ondansetron **OR** ondansetron (ZOFRAN) IV, mouth rinse, oxyCODONE, sodium chloride flush    Objective: Weight change:   Intake/Output Summary (Last 24 hours) at 07/07/2022 0951 Last data filed at 07/07/2022 0307 Gross per 24 hour  Intake 410 ml  Output --  Net 410 ml    Blood pressure 114/71, pulse (!) 114, temperature 98.1 F (36.7 C), temperature source Oral, resp. rate 14, height 5' 9.02" (1.753 m), weight 116.9 kg, SpO2 100 %. Temp:  [97.9 F (36.6 C)-98.8 F (37.1 C)] 98.1 F (36.7 C) (12/14 0842) Pulse Rate:  [75-114] 114 (12/14 0842) Resp:  [14-18] 14 (12/14 0842) BP: (114-143)/(71-81) 114/71 (12/14 0842) SpO2:  [90 %-100 %] 100 % (12/14 [id number])  Physical Exam: Physical Exam Constitutional:      Appearance: He is well-developed.  HENT:     Head: Normocephalic and atraumatic.  Eyes:     Conjunctiva/sclera: Conjunctivae normal.  Cardiovascular:     Rate and Rhythm: Normal rate and regular rhythm.  Pulmonary:     Effort: Pulmonary effort is normal. No respiratory distress.     Breath sounds: No wheezing.  Abdominal:     General: There is no distension.     Palpations: Abdomen is soft.  Musculoskeletal:        General: Normal range of motion.     Cervical back: Normal range of motion and neck supple.  Skin:    General: Skin is warm and dry.  Neurological:     General: No focal deficit present.     Mental Status: He is alert and oriented to person, place, and time.  Psychiatric:        Mood and Affect: Mood normal.        Behavior: Behavior normal.        Thought Content: Thought content normal.        Judgment: Judgment normal.   Chest bandaged   Pictures from General  surgery                CBC:    BMET Recent Labs    07/05/22 1457  07/06/22 0550  NA 135 135  K 3.6 3.8  CL 96* 99  CO2 29 28  GLUCOSE 199* 161*  BUN 9 6  CREATININE 0.68 0.67  CALCIUM 8.5* 8.3*      Liver Panel  Recent Labs    07/05/22 1457 07/06/22 0550  PROT 7.7 7.4  ALBUMIN 1.6* 1.6*  AST 30 32  ALT 32 32  ALKPHOS 155* 130*  BILITOT 0.4 0.3        Sedimentation Rate No results for input(s): "ESRSEDRATE" in the last 72 hours. C-Reactive Protein No results for input(s): "CRP" in the last 72 hours.  Micro Results: Recent Results (from the past 720 hour(s))  MRSA Next Gen by PCR, Nasal     Status: None   Collection Time: 06/23/22 12:36 AM   Specimen: Nasal Mucosa; Nasal Swab  Result Value Ref Range Status   MRSA by PCR Next Gen NOT DETECTED NOT DETECTED Final    Comment: (NOTE) The GeneXpert MRSA Assay (FDA approved for NASAL specimens only), is one component of a comprehensive MRSA colonization surveillance program. It is not intended to diagnose MRSA infection nor to guide or monitor treatment for MRSA infections. Test performance is not FDA approved in patients less than 32 years old. Performed at [facility], [place] [address], [place], [place] [address]   Blood culture (routine x 2)     Status: None   Collection Time: 06/23/22  1:40 PM   Specimen: BLOOD RIGHT ARM  Result Value Ref Range Status   Specimen Description BLOOD RIGHT ARM  Final   Special Requests   Final    BOTTLES DRAWN AEROBIC AND ANAEROBIC Blood Culture results may not be optimal due to an excessive volume of blood received in culture bottles   Culture   Final    NO GROWTH 5 DAYS Performed at [facility], [place] [address] [place], [place] [address]    Report Status 06/28/2022 FINAL  Final  Blood culture (routine x 2)     Status: None   Collection Time: 06/23/22  4:10 PM   Specimen: BLOOD  Result Value Ref Range Status   Specimen Description BLOOD  SITE NOT SPECIFIED  Final   Special Requests   Final    BOTTLES DRAWN AEROBIC AND ANAEROBIC Blood Culture results may not be optimal due to an excessive volume of blood received in culture bottles   Culture   Final    NO GROWTH 5 DAYS Performed at [facility], [place] [address] [place], [place] [address]    Report Status 06/28/2022 FINAL  Final  SARS Coronavirus 2 by RT PCR (hospital order, performed in [facility] hospital lab) *cepheid single result test* Anterior Nasal Swab     Status: None   Collection Time: 06/25/22  2:26 PM   Specimen: Anterior Nasal Swab  Result Value Ref Range Status   SARS Coronavirus 2 by RT PCR NEGATIVE NEGATIVE Final    Comment: (NOTE) SARS-CoV-2 target nucleic acids are NOT DETECTED.  The SARS-CoV-2 RNA is generally detectable in upper and lower respiratory specimens during the acute phase of infection. The lowest concentration of SARS-CoV-2 viral copies this assay can detect is 250 copies / mL. A negative result does not preclude SARS-CoV-2 infection and should not be used as the sole basis for treatment or other patient management decisions.  A negative result may occur with improper specimen collection / handling, submission of specimen other than nasopharyngeal swab, presence of viral  mutation(s) within the areas targeted by this assay, and inadequate number of viral copies (<250 copies / mL). A negative result must be combined with clinical observations, patient history, and epidemiological information.  Fact Sheet for Patients:   RoadLapTop.co.za  Fact Sheet for Healthcare Providers: http://kim-miller.com/  This test is not yet approved or  cleared by the [place] FDA and has been authorized for detection and/or diagnosis of SARS-CoV-2 by FDA under an Emergency Use Authorization (EUA).  This EUA will remain in effect (meaning this test can be used) for the duration of the COVID-19  declaration under Section 564(b)(1) of the Act, 21 U.S.C. section 360bbb-3(b)(1), unless the authorization is terminated or revoked sooner.  Performed at [facility], [address], [place], [place] [address]   Aerobic/Anaerobic Culture w Gram Stain (surgical/deep wound)     Status: None   Collection Time: 06/28/22 11:53 AM   Specimen: Abscess  Result Value Ref Range Status   Specimen Description ABSCESS  Final   Special Requests LEFT BREAST  Final   Gram Stain   Final    ABUNDANT WBC PRESENT,BOTH PMN AND MONONUCLEAR ABUNDANT GRAM NEGATIVE RODS    Culture   Final    MODERATE PREVOTELLA SPECIES BETA LACTAMASE POSITIVE MODERATE ACTINOMYCES SPECIES Standardized susceptibility testing for this organism is not available. Performed at [facility], [address] [place], [place] [address]    Report Status 07/04/2022 FINAL  Final  Aerobic/Anaerobic Culture w Gram Stain (surgical/deep wound)     Status: None (Preliminary result)   Collection Time: 07/01/22  9:48 AM   Specimen: PATH Breast other; Tissue  Result Value Ref Range Status   Specimen Description WOUND  Final   Special Requests ABSCESS LEFT BREAST  Final   Gram Stain   Final    FEW GRAM POSITIVE COCCI RARE GRAM POSITIVE RODS RARE WBC PRESENT, PREDOMINANTLY PMN    Culture   Final    RARE PREVOTELLA SPECIES BETA LACTAMASE POSITIVE Performed at [facility], [address], [place], [place] [address]    Report Status PENDING  Incomplete    Studies/Results: No results found.    Assessment/Plan:  INTERVAL HISTORY:  more prevotella grew on 2nd culture  Principal Problem:   Cellulitis of chest wall Active Problems:   Hyperglycemia   Hyponatremia   Tobacco abuse   Alcohol abuse   Breast abscess   Hidradenitis suppurativa   Dental infection   Morbid obesity (HCC)   Type 2 diabetes mellitus with hyperglycemia (HCC)   Pyomyositis   Anxiety   Hidradenitis   Hypervolemia    YAHEL FUSTON is a 36 y.o. male with morbid obesity newly diagnosed noticed diabetes mellitus likely retinitis suppurativa with recurrent infections in his soft tissues in particular axilla groin now admitted with eft soft tissue breast infection   #1  Chest wall necrotizing soft tissue infection with abscess status postdebridement.  Prevotella and actinomyces been isolated.  Is doing well on Unasyn which will change over to Augmentin.  I would plan on him being discharged with an of Augmentin to make it to a follow-up appointment with me at [facility] at which point I will if he is doing well switch him to amoxicillin 500 mg tid to complete 6 months of total postop abx  Note we will also make sure he has enough augmentin from [facility] to get to appt with me and also rx for amoxicillin in case he somehow misses the appt  because I do NOT want interruption in his abx  2   Hidradenitis suppurativa: He will need connected to dermatology.  3.  Diabetes mellitus: Would benefit from ozempic or simliar drug I would think  4.  Volume overload hopefully reducing the volume of IV antibiotics and getting will help and we would like to go to po eventually  5. Pain with dressing changes: I would imagine dressings need to be packed way they are given sensitive nature of the wounds would defer to general surgery.  Perhaps better pain control around dressing changes as the answer.  I spent 52 minutes with the patient including than 50% of the time in face to face counseling of the patient re his extensive soft tissue infection  along with review of medical records in preparation for the visit and during the visit and in coordination of his care.   [name] has an appointment on 08/10/2021 at 415PM with Dr. Tommy Medal  at  [facility] for Infectious Disease, which  is located in the [facility] at  [place] in [place].  [address], which is located to the left of the  elevators.  Phone: [phone]  Fax: [phone]  [email or web address]  The patient should arrive 30 minutes prior to their appoitment.    I will sign off for now please call with further questions.       LOS: 14 days   Alcide Evener 07/07/2022, 9:51 AM

## 2022-07-07 NOTE — Progress Notes (Signed)
Patient is down and teary-eyed. He states, "these dressing changes are so painful; they are wearing me down. I just don't think I can take anymore. Do you think we can change the type of packing of anything used for dressing change?"  Writer agreed to page surgeon and ask. Maczis, PA, called back and said that long discussion regarding this was had yesterday with patient regarding this and no changes can be made at this time.  Continue to monitor.

## 2022-07-07 NOTE — Progress Notes (Signed)
4 Days Post-Op  Subjective: Tolerating dressing change and wound care better today than yesterday.  Getting hydrotherapy currently  Objective: Vital signs in last 24 hours: Temp:  [98.1 F (36.7 C)-98.8 F (37.1 C)] 98.6 F (37 C) (12/14 1150) Pulse Rate:  [61-114] 61 (12/14 1150) Resp:  [14-18] 14 (12/14 0842) BP: (114-122)/(71-80) 114/71 (12/14 0842) SpO2:  [96 %-100 %] 98 % (12/14 1150) Last BM Date : 07/05/22  Intake/Output from previous day: 12/13 0701 - 12/14 0700 In: 650 [P.O.:240; I.V.:10; IV Piggyback:400] Out: -  Intake/Output this shift: No intake/output data recorded.  PE: Gen:  Alert, NAD, pleasant.  Wound: Left breast/chest wall. 30cm x 9cm x 4cm deep with mixed granulation tissue, fibrinous tissue (more central posterior) and some cauterized tissue. There's an area near the anterior border of the wound that tracks ~4cm cephalad and anteriorly - no purulent drainage from here today. Otherwise no drainage or tracking/tunneling of the wound. Induration and erythema along the left flank and lateral abdomen are stable/improved from 12/9. Counter incisions x 2 clean without drainage.   Lab Results:  Recent Labs    07/06/22 0550 07/07/22 0714  WBC 12.2* 9.7  HGB 8.5* 8.5*  HCT 24.5* 24.1*  PLT 719* 710*   BMET Recent Labs    07/05/22 1457 07/06/22 0550  NA 135 135  K 3.6 3.8  CL 96* 99  CO2 29 28  GLUCOSE 199* 161*  BUN 9 6  CREATININE 0.68 0.67  CALCIUM 8.5* 8.3*   PT/INR No results for input(s): "LABPROT", "INR" in the last 72 hours. CMP     Component Value Date/Time   NA 135 07/06/2022 0550   K 3.8 07/06/2022 0550   CL 99 07/06/2022 0550   CO2 28 07/06/2022 0550   GLUCOSE 161 (H) 07/06/2022 0550   BUN 6 07/06/2022 0550   CREATININE 0.67 07/06/2022 0550   CALCIUM 8.3 (L) 07/06/2022 0550   PROT 7.4 07/06/2022 0550   ALBUMIN 1.6 (L) 07/06/2022 0550   AST 32 07/06/2022 0550   ALT 32 07/06/2022 0550   ALKPHOS 130 (H) 07/06/2022 0550    BILITOT 0.3 07/06/2022 0550   GFRNONAA >60 07/06/2022 0550   Lipase  No results found for: "LIPASE"  Studies/Results: No results found.  Anti-infectives: Anti-infectives (From admission, onward)    Start     Dose/Rate Route Frequency Ordered Stop   08/11/22 0600  amoxicillin (AMOXIL) capsule 500 mg        500 mg Oral Every 8 hours 07/07/22 1135     07/07/22 1200  amoxicillin-clavulanate (AUGMENTIN) 875-125 MG per tablet 1 tablet        1 tablet Oral Every 12 hours 07/07/22 0913 08/10/22 2359   07/05/22 1200  Ampicillin-Sulbactam (UNASYN) 3 g in sodium chloride 0.9 % 100 mL IVPB  Status:  Discontinued        3 g 200 mL/hr over 30 Minutes Intravenous Every 6 hours 07/05/22 0909 07/07/22 0913   07/04/22 1400  piperacillin-tazobactam (ZOSYN) IVPB 3.375 g  Status:  Discontinued        3.375 g 12.5 mL/hr over 240 Minutes Intravenous Every 8 hours 07/04/22 0957 07/05/22 0909   07/03/22 1600  DAPTOmycin (CUBICIN) 700 mg in sodium chloride 0.9 % IVPB  Status:  Discontinued        8 mg/kg  89.2 kg (Adjusted) 128 mL/hr over 30 Minutes Intravenous Daily 07/03/22 1512 07/06/22 1004   07/03/22 0000  vancomycin (VANCOREADY) IVPB 1250 mg/250 mL  Status:  Discontinued        1,250 mg 166.7 mL/hr over 90 Minutes Intravenous Every 12 hours 07/02/22 1119 07/03/22 1512   07/02/22 1215  vancomycin (VANCOREADY) IVPB 2000 mg/400 mL        2,000 mg 200 mL/hr over 120 Minutes Intravenous  Once 07/02/22 1119 07/03/22 0144   06/29/22 1200  ceFEPIme (MAXIPIME) 2 g in sodium chloride 0.9 % 100 mL IVPB  Status:  Discontinued        2 g 200 mL/hr over 30 Minutes Intravenous Every 8 hours 06/29/22 1005 07/04/22 0957   06/29/22 1100  metroNIDAZOLE (FLAGYL) tablet 500 mg  Status:  Discontinued        500 mg Oral Every 12 hours 06/29/22 1005 07/04/22 0957   06/28/22 1200  Ampicillin-Sulbactam (UNASYN) 3 g in sodium chloride 0.9 % 100 mL IVPB  Status:  Discontinued        3 g 200 mL/hr over 30 Minutes Intravenous  Every 6 hours 06/28/22 0911 06/29/22 1005   06/24/22 0600  ceFEPIme (MAXIPIME) 2 g in sodium chloride 0.9 % 100 mL IVPB  Status:  Discontinued        2 g 200 mL/hr over 30 Minutes Intravenous Every 8 hours 06/23/22 2041 06/28/22 0911   06/24/22 0300  vancomycin (VANCOREADY) IVPB 1250 mg/250 mL  Status:  Discontinued        1,250 mg 166.7 mL/hr over 90 Minutes Intravenous Every 12 hours 06/23/22 1543 06/29/22 0819   06/23/22 2100  ceFEPIme (MAXIPIME) 2 g in sodium chloride 0.9 % 100 mL IVPB        2 g 200 mL/hr over 30 Minutes Intravenous  Once 06/23/22 2006 06/23/22 2153   06/23/22 2100  metroNIDAZOLE (FLAGYL) IVPB 500 mg  Status:  Discontinued        500 mg 100 mL/hr over 60 Minutes Intravenous Every 12 hours 06/23/22 2006 06/28/22 0849   06/23/22 1600  vancomycin (VANCOREADY) IVPB 1500 mg/300 mL        1,500 mg 150 mL/hr over 120 Minutes Intravenous  Once 06/23/22 1514 06/23/22 1912   06/23/22 1430  vancomycin (VANCOCIN) IVPB 1000 mg/200 mL premix        1,000 mg 200 mL/hr over 60 Minutes Intravenous  Once 06/23/22 1418 06/23/22 1624   06/23/22 1430  cefTRIAXone (ROCEPHIN) 2 g in sodium chloride 0.9 % 100 mL IVPB        2 g 200 mL/hr over 30 Minutes Intravenous  Once 06/23/22 1418 06/23/22 1506        Assessment/Plan POD 6/4 s/p I&D of 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 Carolynne Edouard and Dr. Janee Morn on 07/01/22 and 07/03/22 respectively  - Wound debridement left chest wall necrotizing soft tissue infection now measures 30cm x 9cm x 4cm deep, incision and drainage left flank x 2  - Cont abx. Cx's from 12/5 (IR) with Prevotella species, beta lactamase positive, moderate actinomyces. Cx from 12/8 pending. Abx selection per ID.  - WTD dressing changes BID. Cont hydrotherapy and have asked hydrotherapy to transition to pulse lavage hydrotherapy as this wound would greatly benefit from that along with medihoney. -will see with hydrotherapy again tomorrow. - No plans to  return to the OR at this time. Continue bedside dressing changes and abx.    FEN: CM diet VTE: SCDs, LMWH ID: Unasyn/Daptomycin per ID   - below per TRH -  T2DM, uncontrolled - A1c 11.6, SSI Alcohol abuse Tobacco abuse Obesity class II  Hyponatremia - s/p lasix   LOS: 14 days    Letha Cape , [facility] Surgery 07/07/2022, 2:44 PM Please see Amion for pager number during day hours 7:00am-4:30pm

## 2022-07-07 NOTE — Progress Notes (Incomplete)
Augmentin thru 1/17, then po Amox

## 2022-07-08 DIAGNOSIS — A419 Sepsis, unspecified organism: Secondary | ICD-10-CM | POA: Diagnosis not present

## 2022-07-08 DIAGNOSIS — E1165 Type 2 diabetes mellitus with hyperglycemia: Secondary | ICD-10-CM | POA: Diagnosis not present

## 2022-07-08 DIAGNOSIS — L03313 Cellulitis of chest wall: Secondary | ICD-10-CM | POA: Diagnosis not present

## 2022-07-08 DIAGNOSIS — L0291 Cutaneous abscess, unspecified: Secondary | ICD-10-CM | POA: Diagnosis not present

## 2022-07-08 LAB — CBC
HCT: 25.1 % — ABNORMAL LOW (ref 39.0–52.0)
Hemoglobin: 8.7 g/dL — ABNORMAL LOW (ref 13.0–17.0)
MCH: 28 pg (ref 26.0–34.0)
MCHC: 34.7 g/dL (ref 30.0–36.0)
MCV: 80.7 fL (ref 80.0–100.0)
Platelets: 750 10*3/uL — ABNORMAL HIGH (ref 150–400)
RBC: 3.11 MIL/uL — ABNORMAL LOW (ref 4.22–5.81)
RDW: 14.2 % (ref 11.5–15.5)
WBC: 6.7 10*3/uL (ref 4.0–10.5)
nRBC: 0 % (ref 0.0–0.2)

## 2022-07-08 LAB — BASIC METABOLIC PANEL
Anion gap: 8 (ref 5–15)
BUN: 5 mg/dL — ABNORMAL LOW (ref 6–20)
CO2: 31 mmol/L (ref 22–32)
Calcium: 8.9 mg/dL (ref 8.9–10.3)
Chloride: 98 mmol/L (ref 98–111)
Creatinine, Ser: 0.59 mg/dL — ABNORMAL LOW (ref 0.61–1.24)
GFR, Estimated: >60 mL/min (ref >60)
Glucose, Bld: 132 mg/dL — ABNORMAL HIGH (ref 70–99)
Potassium: 4.4 mmol/L (ref 3.5–5.1)
Sodium: 137 mmol/L (ref 135–145)

## 2022-07-08 LAB — GLUCOSE, CAPILLARY
Glucose-Capillary: 142 mg/dL — ABNORMAL HIGH (ref 70–99)
Glucose-Capillary: 127 mg/dL — ABNORMAL HIGH (ref 70–99)
Glucose-Capillary: 138 mg/dL — ABNORMAL HIGH (ref 70–99)
Glucose-Capillary: 174 mg/dL — ABNORMAL HIGH (ref 70–99)

## 2022-07-08 MED ORDER — ALTEPLASE 2 MG IJ SOLR
2.0000 mg | Freq: Once | INTRAMUSCULAR | Status: AC
  Administered 2022-07-08: 2 mg
  Filled 2022-07-08: qty 2

## 2022-07-08 NOTE — TOC Progression Note (Signed)
Transition of Care [facility]) - Progression Note    Patient Details  Name: [name] MRN: [id number] Date of Birth: 1986/07/23  [facility]) CM/SW Contact  Beckey Rutter, MSW, [place] Phone Number: [phone] 07/08/2022, 3:46 PM  Clinical Narrative:   CSW met with pt  at bedside. CSW and pt discussed Alcohol and tobacco use. Pt reported that he does not believe his alcohol use or tobacco use will be an issue upon dc. Pt stated he feels that this is a wake up call and will be taking his health more seriously moving forward. Pt did report that his nicotine patch has helped him tremendously. CSW offered SU resources for pt and pt politely declined as he has already committed to cease his alcohol and tobacco use.   Beckey Rutter, MSW, LCSWA, LCASA Transitions of Care  Clinical Social Worker I

## 2022-07-08 NOTE — Progress Notes (Signed)
Physical Therapy Wound Treatment Patient Details  Name: [name] MRN: [id number] Date of Birth: [birth year]  Today's Date: 07/08/2022 Time: 2025-4270 Time Calculation (min): 74 min  Subjective  Subjective Assessment Patient and Family Stated Goals: get back to his son Date of Onset:  (November 2023) Prior Treatments: surgical debridement  Pain Score:  6/10 Pt premedicated with IV and oral pain medication   Wound Assessment  Wound / Incision (Open or Dehisced) 06/28/22 Incision - Open;Incision - Dehisced Left;Upper (Active)  Wound Image   07/07/22 1600  Dressing Type Gauze (Comment);ABD;Barrier Film (skin prep);Moist to moist;Honey 07/08/22 1300  Dressing Changed Changed 07/08/22 1300  Dressing Status New drainage 07/08/22 1300  Dressing Change Frequency Twice a day 07/08/22 1300  Site / Wound Assessment Granulation tissue;Yellow;Red;Pink;Black;Painful 07/08/22 1300  % Wound base Red or Granulating 70% 07/08/22 1300  % Wound base Yellow/Fibrinous Exudate 30% 07/08/22 1300  Peri-wound Assessment Induration;Erythema (blanchable) 07/08/22 1300  Wound Length (cm) 9 cm 07/07/22 1600  Wound Width (cm) 30 cm 07/07/22 1600  Wound Depth (cm) 4 cm 07/07/22 1600  Wound Volume (cm^3) 1080 cm^3 07/07/22 1600  Wound Surface Area (cm^2) 270 cm^2 07/07/22 1600  Margins Unattached edges (unapproximated) 07/08/22 1300  Closure None 07/08/22 1300  Drainage Amount Moderate 07/08/22 1300  Drainage Description Serous 07/08/22 1300  Non-staged Wound Description Not applicable 62/37/62 8315  Treatment Debridement (Selective);Hydrotherapy (Pulse lavage);Packing (Dry gauze);Packing (Saline gauze) 07/08/22 1300      Selective Debridement (non-excisional) Selective Debridement (non-excisional) - Location: posterior to central part of the incision Selective Debridement (non-excisional) - Tools Used: Forceps, Scalpel, Scissors Selective Debridement (non-excisional) - Tissue Removed: yellowish gray,  fibrin slough    Wound Assessment and Plan  Wound Therapy - Assess/Plan/Recommendations Wound Therapy - Clinical Statement: Pt given IV Morphine and oral Hydrocodone prior to treatment. Pt educated on addition of pulse lavage to soften fibrin slough for improved selective debridement. Pt very apprehensive about treatment but ultimately agreeable and reports not as bad as he thought it would be. Continued treatment to reduce bioburden and work towards wound vac placement. Will return tomorrow for treatment Wound Therapy - Functional Problem List: Newly diagnosed diabetes Factors Delaying/Impairing Wound Healing: Diabetes Mellitus Hydrotherapy Plan: Debridement, Dressing change, Patient/family education, Pulsatile lavage with suction Wound Therapy - Frequency:  (2x/wk) Wound Therapy - Follow Up Recommendations: dressing changes by RN  Wound Therapy Goals- Improve the function of patient's integumentary system by progressing the wound(s) through the phases of wound healing (inflammation - proliferation - remodeling) by: Wound Therapy Goals - Improve the function of patient's integumentary system by progressing the wound(s) through the phases of wound healing by: Decrease Necrotic Tissue to: 10 Decrease Necrotic Tissue - Progress: Progressing toward goal Improve Drainage Characteristics: Min Improve Drainage Characteristics - Progress: Progressing toward goal Goals/treatment plan/discharge plan were made with and agreed upon by patient/family: Yes Time For Goal Achievement: 7 days Wound Therapy - Potential for Goals: Excellent  Goals will be updated until maximal potential achieved or discharge criteria met.  Discharge criteria: when goals achieved, discharge from hospital, MD decision/surgical intervention, no progress towards goals, refusal/missing three consecutive treatments without notification or medical reason.  GP     Charges PT Wound Care Charges $Wound Debridement up to 20 cm: <  or equal to 20 cm $ Wound Debridement each add'l 20 sqcm: 3 $PT Hydrotherapy Dressing: 1 dressing $PT PLS Gun and Tip: 1 Supply $PT Hydrotherapy Visit: 1 Visit     [name] B. [employer] PT,  DPT Acute Rehabilitation Services Please use secure chat or  Call Office [phone]   [place] 07/08/2022, 1:51 PM

## 2022-07-08 NOTE — Progress Notes (Signed)
[facility]                                                                              [name], is a 36 y.o. male, DOB - 01/28/1986, [id number]:[id number] Admit date - 06/23/2022    Outpatient Primary MD for the patient is Patient, [name]  LOS - 15  days  Chief Complaint  Patient presents with   Abscess       Brief summary    Patient is a 36 year old male with history of obesity, EtOH use comes into the hospital with left chest wall cellulitis and sepsis. He reports initially there was a boil in the area that popped open, and following that the whole area started to get worse.  12/5: Underwent aspiration 12/8, 12/10: Underwent debridement by surgery   Assessment & Plan    Principal Problem: Sepsis due to chest wall cellulitis/abscess, necrotizing soft tissue infection -Ultrasound chest showed possible abscess, IR initially consulted, status post aspiration on 5/5 -General surgery consulted, underwent I&D in OR on 5/8, repeated debridement on 12/10 -ID consulted, surgical cultures with prevotella, beta-lactamase species, actinomyces , on daptomycin and Unasyn, now narrowed to Augmentin.   -will need prolonged therapy for at least 6 months given the actinomyces, plan to start amoxicillin on 08/11/2022. -Has PICC line  -Per surgery, no plans for repeat I&D, continue dressing changes, wound care following   Active problems Diabetes mellitus, poorly controlled with hyperglycemia, new diagnosis -Hemoglobin A1c 11.6 on 06/24/2022 -Likely will need insulin for short-term until A1c <8 for better healing of the wound.  Patient is agreeable for home insulin -CBGs elevated, increase NovoLog 70/30 to 15 units twice daily, continue SSI   Hyponatremia -Likely due to fluid overload, resolved with Lasix   Mild transaminitis, alk phos -In the setting of acute illness, improving -AST ALT normal  Microcytic anemia -H&H stable, improving  Alcohol and tobacco  use -Recommended cessation    Alcohol and tobacco use-recommend cessation  Obesity Estimated body mass index is 38.04 kg/m as calculated from the following:   Height as of this encounter: 5' 9.02" (1.753 m).   Weight as of this encounter: 116.9 kg.  Code Status: Full CODE STATUS DVT Prophylaxis:    Level of Care: Level of care: Med-Surg Family Communication: Updated patient   Disposition Plan:      Remains inpatient appropriate: Not medically ready for discharge   Procedures:  I&D x 2  Consultants:   IR Infectious disease Surgery  Antimicrobials:   Anti-infectives (From admission, onward)    Start     Dose/Rate Route Frequency Ordered Stop   08/11/22 0600  amoxicillin (AMOXIL) capsule 500 mg        500 mg Oral Every 8 hours 07/07/22 1135     07/07/22 1200  amoxicillin-clavulanate (AUGMENTIN) 875-125 MG per tablet 1 tablet        1 tablet Oral Every 12 hours 07/07/22 0913 08/10/22 2359   07/05/22 1200  Ampicillin-Sulbactam (UNASYN) 3 g in sodium chloride 0.9 % 100 mL IVPB  Status:  Discontinued        3 g 200 mL/hr over 30  Minutes Intravenous Every 6 hours 07/05/22 0909 07/07/22 0913   07/04/22 1400  piperacillin-tazobactam (ZOSYN) IVPB 3.375 g  Status:  Discontinued        3.375 g 12.5 mL/hr over 240 Minutes Intravenous Every 8 hours 07/04/22 0957 07/05/22 0909   07/03/22 1600  DAPTOmycin (CUBICIN) 700 mg in sodium chloride 0.9 % IVPB  Status:  Discontinued        8 mg/kg  89.2 kg (Adjusted) 128 mL/hr over 30 Minutes Intravenous Daily 07/03/22 1512 07/06/22 1004   07/03/22 0000  vancomycin (VANCOREADY) IVPB 1250 mg/250 mL  Status:  Discontinued        1,250 mg 166.7 mL/hr over 90 Minutes Intravenous Every 12 hours 07/02/22 1119 07/03/22 1512   07/02/22 1215  vancomycin (VANCOREADY) IVPB 2000 mg/400 mL        2,000 mg 200 mL/hr over 120 Minutes Intravenous  Once 07/02/22 1119 07/03/22 0144   06/29/22 1200  ceFEPIme (MAXIPIME) 2 g in sodium chloride 0.9 % 100 mL  IVPB  Status:  Discontinued        2 g 200 mL/hr over 30 Minutes Intravenous Every 8 hours 06/29/22 1005 07/04/22 0957   06/29/22 1100  metroNIDAZOLE (FLAGYL) tablet 500 mg  Status:  Discontinued        500 mg Oral Every 12 hours 06/29/22 1005 07/04/22 0957   06/28/22 1200  Ampicillin-Sulbactam (UNASYN) 3 g in sodium chloride 0.9 % 100 mL IVPB  Status:  Discontinued        3 g 200 mL/hr over 30 Minutes Intravenous Every 6 hours 06/28/22 0911 06/29/22 1005   06/24/22 0600  ceFEPIme (MAXIPIME) 2 g in sodium chloride 0.9 % 100 mL IVPB  Status:  Discontinued        2 g 200 mL/hr over 30 Minutes Intravenous Every 8 hours 06/23/22 2041 06/28/22 0911   06/24/22 0300  vancomycin (VANCOREADY) IVPB 1250 mg/250 mL  Status:  Discontinued        1,250 mg 166.7 mL/hr over 90 Minutes Intravenous Every 12 hours 06/23/22 1543 06/29/22 0819   06/23/22 2100  ceFEPIme (MAXIPIME) 2 g in sodium chloride 0.9 % 100 mL IVPB        2 g 200 mL/hr over 30 Minutes Intravenous  Once 06/23/22 2006 06/23/22 2153   06/23/22 2100  metroNIDAZOLE (FLAGYL) IVPB 500 mg  Status:  Discontinued        500 mg 100 mL/hr over 60 Minutes Intravenous Every 12 hours 06/23/22 2006 06/28/22 0849   06/23/22 1600  vancomycin (VANCOREADY) IVPB 1500 mg/300 mL        1,500 mg 150 mL/hr over 120 Minutes Intravenous  Once 06/23/22 1514 06/23/22 1912   06/23/22 1430  vancomycin (VANCOCIN) IVPB 1000 mg/200 mL premix        1,000 mg 200 mL/hr over 60 Minutes Intravenous  Once 06/23/22 1418 06/23/22 1624   06/23/22 1430  cefTRIAXone (ROCEPHIN) 2 g in sodium chloride 0.9 % 100 mL IVPB        2 g 200 mL/hr over 30 Minutes Intravenous  Once 06/23/22 1418 06/23/22 1506          Medications  [START ON 08/11/2022] amoxicillin  500 mg Oral Q8H   amoxicillin-clavulanate  1 tablet Oral Q12H   Chlorhexidine Gluconate Cloth  6 each Topical Daily   enoxaparin (LOVENOX) injection  55 mg Subcutaneous Q24H   feeding supplement  237 mL Oral BID WC    folic acid  1 mg Oral Daily  insulin aspart  0-15 Units Subcutaneous TID WC   insulin aspart  0-5 Units Subcutaneous QHS   insulin aspart protamine- aspart  15 Units Subcutaneous BID WC   leptospermum manuka honey  1 Application Topical Daily   multivitamin with minerals  1 tablet Oral Daily   nicotine  14 mg Transdermal Daily   nutrition supplement (JUVEN)  1 packet Oral BID BM   polyethylene glycol  17 g Oral Daily   saccharomyces boulardii  250 mg Oral BID   sodium chloride flush  10-40 mL Intracatheter Q12H   thiamine  100 mg Oral Daily      Subjective:   [name] was seen and examined today.  States pain was better yesterday with dressing change, 5/10, no acute nausea vomiting, fevers.   Objective:   Vitals:   07/07/22 2042 07/08/22 0423 07/08/22 0732 07/08/22 1205  BP: 133/77 122/66 104/76 124/87  Pulse: 100 88    Resp: _0 Temp: 98.4 F (36.9 C) 98.5 F (36.9 C) 98.5 F (36.9 C) 98.7 F (37.1 C)  TempSrc: Oral Oral Oral Oral  SpO2: 100% 95% 96% 98%  Weight:      Height:        Intake/Output Summary (Last 24 hours) at 07/08/2022 1411 Last data filed at 07/08/2022 0911 Gross per 24 hour  Intake 550 ml  Output --  Net 550 ml     Wt Readings from Last 3 Encounters:  07/03/22 116.9 kg  09/16/16 124.7 kg  08/06/16 120.2 kg   Physical Exam General: Alert and oriented x 3, NAD Cardiovascular: S1 S2 clear, RRR.  Respiratory: CTAB Gastrointestinal: Soft, NT, ND NBS Ext: no pedal edema bilaterally Skin: Dressing intact to lower left chest wall and flank area    Data Reviewed:  I have personally reviewed following labs    CBC Lab Results  Component Value Date   WBC 6.7 07/08/2022   RBC 3.11 (L) 07/08/2022   HGB 8.7 (L) 07/08/2022   HCT 25.1 (L) 07/08/2022   MCV 80.7 07/08/2022   MCH 28.0 07/08/2022   PLT 750 (H) 07/08/2022   MCHC 34.7 07/08/2022   RDW 14.2 07/08/2022   LYMPHSABS 2.0 07/02/2022   MONOABS 0.7 07/02/2022   EOSABS 0.1  07/02/2022   BASOSABS 0.1 48/27/0786     Last metabolic panel Lab Results  Component Value Date   NA 137 07/08/2022   K 4.4 07/08/2022   CL 98 07/08/2022   CO2 31 07/08/2022   BUN 5 (L) 07/08/2022   CREATININE 0.59 (L) 07/08/2022   GLUCOSE 132 (H) 07/08/2022   GFRNONAA >60 07/08/2022   CALCIUM 8.9 07/08/2022   PROT 7.4 07/06/2022   ALBUMIN 1.6 (L) 07/06/2022   BILITOT 0.3 07/06/2022   ALKPHOS 130 (H) 07/06/2022   AST 32 07/06/2022   ALT 32 07/06/2022   ANIONGAP 8 07/08/2022    CBG (last 3)  Recent Labs    07/07/22 2138 07/08/22 0617 07/08/22 1200  GLUCAP 157* 142* 127*       Radiology Studies: I have personally reviewed the imaging studies  No results found.     Estill Cotta M.D. [facility] 07/08/2022, 2:11 PM  Available via Epic secure chat 7am-7pm After 7 pm, please refer to night coverage provider listed on amion.

## 2022-07-08 NOTE — Progress Notes (Signed)
Inpatient Diabetes Program Recommendations  AACE/ADA: New Consensus Statement on Inpatient Glycemic Control (2015)  Target Ranges:  Prepandial:   less than 140 mg/dL      Peak postprandial:   less than 180 mg/dL (1-2 hours)      Critically ill patients:  140 - 180 mg/dL   Lab Results  Component Value Date   GLUCAP 127 (H) 07/08/2022   HGBA1C 11.6 (H) 06/24/2022    Review of Glycemic Control  Latest Reference Range & Units 07/07/22 06:18 07/07/22 12:40 07/07/22 15:43 07/07/22 21:38 07/08/22 06:17 07/08/22 12:00  Glucose-Capillary 70 - 99 mg/dL 270 (H) 350 (H) 093 (H) 157 (H) 142 (H) 127 (H)  Diabetes history: New DM dx this admission Outpatient Diabetes medications: NA Current orders for Inpatient glycemic control: 70/30 15 units BID, Novolog 0-15 units TID with meals, Novolog 0-5 units QHS  Inpatient Diabetes Program Recommendations:     Blood sugars improved.  Spoke with patient again today regarding new diagnosis of DM.  He states that he has been trying to eat better and talked about what a shock this has been for him.  Reviewed again the importance of glycemic control to assist with wound healing and prevent further complications. He is agreeable to insulin and plans to check blood sugars at least bid at home.  We reviewed goal blood sugars of 80-130 mg/dL (fasting) and less than 180 mg/dL after eating.  Discussed that his current regimen would include a shot with breakfast and with supper.  He has been thinking about the CGM and states that initially he thought that he would not be able to afford, however he thinks that with him not drinking or smoking, he may be able to afford.   Will follow up closer to discharge to see if patient wants sample sensor for discharge home.  Discussed importance of f/u with PCP and the chronic nature of DM that requires medical care for life.   He states that he needs PCP to follow up with as well.    Thanks,  Beryl Meager, RN, BC-ADM Inpatient  Diabetes Coordinator Pager [phone]  (8a-5p)

## 2022-07-08 NOTE — Progress Notes (Signed)
5 Days Post-Op  Subjective: CC: Pain during dressing changes improved.  Afebrile overnight. WBC wnl.  Seen with hydro.   Objective: Vital signs in last 24 hours: Temp:  [98.3 F (36.8 C)-98.6 F (37 C)] 98.5 F (36.9 C) (12/15 0732) Pulse Rate:  [61-103] 88 (12/15 0423) Resp:  [18-20] 20 (12/15 0732) BP: (104-133)/(66-77) 104/76 (12/15 0732) SpO2:  [95 %-100 %] 96 % (12/15 0732) Last BM Date : 07/07/22  Intake/Output from previous day: 12/14 0701 - 12/15 0700 In: 180 [P.O.:180] Out: -  Intake/Output this shift: Total I/O In: 370 [P.O.:360; I.V.:10] Out: -   PE: Gen:  Alert, NAD, pleasant Wound: Left breast/chest wall. 30cm x 9cm x 4cm deep with mixed granulation tissue, fibrinous tissue (more central posterior) and some cauterized tissue. There's an area near the anterior border of the wound that tracks ~4cm cephalad and anteriorly - no purulent drainage from here today. Otherwise no drainage or tracking/tunneling of the wound. He still has some induration along the superior border of his wound. Induration and erythema along the left flank and lateral abdomen are stable/improved from 12/9. Counter incisions x 2 clean without drainage.     Lab Results:  Recent Labs    07/07/22 0714 07/08/22 0903  WBC 9.7 6.7  HGB 8.5* 8.7*  HCT 24.1* 25.1*  PLT 710* 750*   BMET Recent Labs    07/06/22 0550 07/08/22 0903  NA 135 137  K 3.8 4.4  CL 99 98  CO2 28 31  GLUCOSE 161* 132*  BUN 6 5*  CREATININE 0.67 0.59*  CALCIUM 8.3* 8.9   PT/INR No results for input(s): "LABPROT", "INR" in the last 72 hours. CMP     Component Value Date/Time   NA 137 07/08/2022 0903   K 4.4 07/08/2022 0903   CL 98 07/08/2022 0903   CO2 31 07/08/2022 0903   GLUCOSE 132 (H) 07/08/2022 0903   BUN 5 (L) 07/08/2022 0903   CREATININE 0.59 (L) 07/08/2022 0903   CALCIUM 8.9 07/08/2022 0903   PROT 7.4 07/06/2022 0550   ALBUMIN 1.6 (L) 07/06/2022 0550   AST 32 07/06/2022 0550   ALT 32  07/06/2022 0550   ALKPHOS 130 (H) 07/06/2022 0550   BILITOT 0.3 07/06/2022 0550   GFRNONAA >60 07/08/2022 0903   Lipase  No results found for: "LIPASE"  Studies/Results: No results found.  Anti-infectives: Anti-infectives (From admission, onward)    Start     Dose/Rate Route Frequency Ordered Stop   08/11/22 0600  amoxicillin (AMOXIL) capsule 500 mg        500 mg Oral Every 8 hours 07/07/22 1135     07/07/22 1200  amoxicillin-clavulanate (AUGMENTIN) 875-125 MG per tablet 1 tablet        1 tablet Oral Every 12 hours 07/07/22 0913 08/10/22 2359   07/05/22 1200  Ampicillin-Sulbactam (UNASYN) 3 g in sodium chloride 0.9 % 100 mL IVPB  Status:  Discontinued        3 g 200 mL/hr over 30 Minutes Intravenous Every 6 hours 07/05/22 0909 07/07/22 0913   07/04/22 1400  piperacillin-tazobactam (ZOSYN) IVPB 3.375 g  Status:  Discontinued        3.375 g 12.5 mL/hr over 240 Minutes Intravenous Every 8 hours 07/04/22 0957 07/05/22 0909   07/03/22 1600  DAPTOmycin (CUBICIN) 700 mg in sodium chloride 0.9 % IVPB  Status:  Discontinued        8 mg/kg  89.2 kg (Adjusted) 128 mL/hr over 30 Minutes  Intravenous Daily 07/03/22 1512 07/06/22 1004   07/03/22 0000  vancomycin (VANCOREADY) IVPB 1250 mg/250 mL  Status:  Discontinued        1,250 mg 166.7 mL/hr over 90 Minutes Intravenous Every 12 hours 07/02/22 1119 07/03/22 1512   07/02/22 1215  vancomycin (VANCOREADY) IVPB 2000 mg/400 mL        2,000 mg 200 mL/hr over 120 Minutes Intravenous  Once 07/02/22 1119 07/03/22 0144   06/29/22 1200  ceFEPIme (MAXIPIME) 2 g in sodium chloride 0.9 % 100 mL IVPB  Status:  Discontinued        2 g 200 mL/hr over 30 Minutes Intravenous Every 8 hours 06/29/22 1005 07/04/22 0957   06/29/22 1100  metroNIDAZOLE (FLAGYL) tablet 500 mg  Status:  Discontinued        500 mg Oral Every 12 hours 06/29/22 1005 07/04/22 0957   06/28/22 1200  Ampicillin-Sulbactam (UNASYN) 3 g in sodium chloride 0.9 % 100 mL IVPB  Status:   Discontinued        3 g 200 mL/hr over 30 Minutes Intravenous Every 6 hours 06/28/22 0911 06/29/22 1005   06/24/22 0600  ceFEPIme (MAXIPIME) 2 g in sodium chloride 0.9 % 100 mL IVPB  Status:  Discontinued        2 g 200 mL/hr over 30 Minutes Intravenous Every 8 hours 06/23/22 2041 06/28/22 0911   06/24/22 0300  vancomycin (VANCOREADY) IVPB 1250 mg/250 mL  Status:  Discontinued        1,250 mg 166.7 mL/hr over 90 Minutes Intravenous Every 12 hours 06/23/22 1543 06/29/22 0819   06/23/22 2100  ceFEPIme (MAXIPIME) 2 g in sodium chloride 0.9 % 100 mL IVPB        2 g 200 mL/hr over 30 Minutes Intravenous  Once 06/23/22 2006 06/23/22 2153   06/23/22 2100  metroNIDAZOLE (FLAGYL) IVPB 500 mg  Status:  Discontinued        500 mg 100 mL/hr over 60 Minutes Intravenous Every 12 hours 06/23/22 2006 06/28/22 0849   06/23/22 1600  vancomycin (VANCOREADY) IVPB 1500 mg/300 mL        1,500 mg 150 mL/hr over 120 Minutes Intravenous  Once 06/23/22 1514 06/23/22 1912   06/23/22 1430  vancomycin (VANCOCIN) IVPB 1000 mg/200 mL premix        1,000 mg 200 mL/hr over 60 Minutes Intravenous  Once 06/23/22 1418 06/23/22 1624   06/23/22 1430  cefTRIAXone (ROCEPHIN) 2 g in sodium chloride 0.9 % 100 mL IVPB        2 g 200 mL/hr over 30 Minutes Intravenous  Once 06/23/22 1418 06/23/22 1506        Assessment/Plan POD 7/5 s/p I&D of 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 Carolynne Edouard and Dr. Janee Morn on 07/01/22 and 07/03/22 respectively  - Wound debridement left chest wall necrotizing soft tissue infection now measures 30cm x 9cm x 4cm deep, incision and drainage left flank x 2  - Cont abx. Cx's from 12/5 (IR) with Prevotella species, beta lactamase positive, moderate actinomyces. Cx from 12/8 with prevotella species, beta lactamase +. Abx selection per ID.  - WTD dressing changes BID with medihoney. Cont hydrotherapy (pulse lavage hydrotherapy)/  - No plans to return to the OR at this time. Continue  bedside dressing changes and abx.    FEN: CM diet VTE: SCDs, LMWH ID: Augmentin per ID. Afebrile. WBC wnl.    - below per TRH -  T2DM, uncontrolled - A1c 11.6, SSI Alcohol  abuse Tobacco abuse Obesity class II Hyponatremia - s/p lasix   LOS: 15 days    Jacinto Halim , [facility] Surgery 07/08/2022, 10:39 AM Please see Amion for pager number during day hours 7:00am-4:30pm

## 2022-07-09 DIAGNOSIS — A419 Sepsis, unspecified organism: Secondary | ICD-10-CM | POA: Diagnosis not present

## 2022-07-09 DIAGNOSIS — L0291 Cutaneous abscess, unspecified: Secondary | ICD-10-CM | POA: Diagnosis not present

## 2022-07-09 DIAGNOSIS — E1165 Type 2 diabetes mellitus with hyperglycemia: Secondary | ICD-10-CM | POA: Diagnosis not present

## 2022-07-09 DIAGNOSIS — L03313 Cellulitis of chest wall: Secondary | ICD-10-CM | POA: Diagnosis not present

## 2022-07-09 LAB — GLUCOSE, CAPILLARY
Glucose-Capillary: 168 mg/dL — ABNORMAL HIGH (ref 70–99)
Glucose-Capillary: 212 mg/dL — ABNORMAL HIGH (ref 70–99)
Glucose-Capillary: 229 mg/dL — ABNORMAL HIGH (ref 70–99)
Glucose-Capillary: 152 mg/dL — ABNORMAL HIGH (ref 70–99)

## 2022-07-09 MED ORDER — KETOROLAC TROMETHAMINE 15 MG/ML IJ SOLN: 15.0000 mg | Freq: Three times a day (TID) | INTRAMUSCULAR | Status: DC | PRN

## 2022-07-09 MED ORDER — OXYCODONE HCL 5 MG PO TABS
5.0000 mg | ORAL_TABLET | ORAL | Status: DC | PRN
  Administered 2022-07-09: 15 mg via ORAL
  Administered 2022-07-10 (×2): 5 mg via ORAL
  Administered 2022-07-10 – 2022-07-11 (×2): 10 mg via ORAL
  Administered 2022-07-11: 5 mg via ORAL
  Administered 2022-07-11: 10 mg via ORAL
  Administered 2022-07-11: 5 mg via ORAL
  Administered 2022-07-12 (×3): 10 mg via ORAL
  Administered 2022-07-13: 15 mg via ORAL
  Filled 2022-07-09: qty 3
  Filled 2022-07-09 (×2): qty 2
  Filled 2022-07-09: qty 1
  Filled 2022-07-09: qty 2
  Filled 2022-07-09: qty 1
  Filled 2022-07-09: qty 2
  Filled 2022-07-09 (×3): qty 1
  Filled 2022-07-09 (×2): qty 2
  Filled 2022-07-09: qty 3

## 2022-07-09 MED ORDER — ENOXAPARIN SODIUM 60 MG/0.6ML IJ SOSY
60.0000 mg | PREFILLED_SYRINGE | INTRAMUSCULAR | Status: DC
  Administered 2022-07-09 – 2022-07-12 (×4): 60 mg via SUBCUTANEOUS
  Filled 2022-07-09 (×4): qty 0.6

## 2022-07-09 MED ORDER — ACETAMINOPHEN 325 MG PO TABS
650.0000 mg | ORAL_TABLET | Freq: Four times a day (QID) | ORAL | Status: DC
  Administered 2022-07-09 – 2022-07-13 (×13): 650 mg via ORAL
  Filled 2022-07-09 (×14): qty 2

## 2022-07-09 MED ORDER — OXYCODONE HCL ER 10 MG PO T12A: 10.0000 mg | EXTENDED_RELEASE_TABLET | Freq: Two times a day (BID) | ORAL | Status: DC

## 2022-07-09 NOTE — Progress Notes (Signed)
[facility]                                                                              Vincient Vanaman, is a 36 y.o. male, DOB - Dec 14, 1985, VOH:[id number] Admit date - 06/23/2022    Outpatient Primary MD for the patient is Patient, [name]  LOS - 16  days  Chief Complaint  Patient presents with   Abscess       Brief summary    Patient is a 36 year old male with history of obesity, EtOH use comes into the hospital with left chest wall cellulitis and sepsis. He reports initially there was a boil in the area that popped open, and following that the whole area started to get worse.  12/5: Underwent aspiration 12/8, 12/10: Underwent debridement by surgery   Assessment & Plan    Principal Problem: Sepsis due to chest wall cellulitis/abscess, necrotizing soft tissue infection -Ultrasound chest showed possible abscess, IR initially consulted, status post aspiration on 5/5 -General surgery consulted, underwent I&D in OR on 5/8, repeated debridement on 12/10 -ID consulted, surgical cultures with prevotella, beta-lactamase species, actinomyces, on daptomycin and Unasyn, now narrowed to Augmentin.   -will need prolonged therapy for at least 6 months given the actinomyces, plan to start amoxicillin on 08/11/2022. -Has PICC line  -Per surgery, no plans for repeat I&D, continue dressing changes, wound care following -Continue Augmentin -Placed on scheduled Tylenol, IV Toradol as needed, increased oxycodone IR 5 to 15 mg every 4 hours as needed.  Continue IV morphine as needed, especially during dressing changes    Active problems Diabetes mellitus, poorly controlled with hyperglycemia, new diagnosis -Hemoglobin A1c 11.6 on 06/24/2022 -Likely will need insulin for short-term until A1c <8 for better healing of the wound.  Patient is agreeable for home insulin -Continue NovoLog 70/30 15 units twice daily, SSI    Hyponatremia -Likely due to fluid overload, resolved with  Lasix   Mild transaminitis, alk phos -In the setting of acute illness, improving -AST ALT normal  Microcytic anemia -H&H improving, 8.7  Alcohol and tobacco use -Recommended cessation Continue nicotine patch   Obesity Estimated body mass index is 38.04 kg/m as calculated from the following:   Height as of this encounter: 5' 9.02" (1.753 m).   Weight as of this encounter: 116.9 kg.  Code Status: Full CODE STATUS DVT Prophylaxis:    Level of Care: Level of care: Med-Surg Family Communication: Updated patient   Disposition Plan:      Remains inpatient appropriate: Not medically ready for discharge   Procedures:  I&D x 2  Consultants:   IR Infectious disease Surgery  Antimicrobials:   Anti-infectives (From admission, onward)    Start     Dose/Rate Route Frequency Ordered Stop   08/11/22 0600  amoxicillin (AMOXIL) capsule 500 mg        500 mg Oral Every 8 hours 07/07/22 1135     07/07/22 1200  amoxicillin-clavulanate (AUGMENTIN) 875-125 MG per tablet 1 tablet        1 tablet Oral Every 12 hours 07/07/22 0913 08/10/22 2359   07/05/22 1200  Ampicillin-Sulbactam (UNASYN) 3 g  in sodium chloride 0.9 % 100 mL IVPB  Status:  Discontinued        3 g 200 mL/hr over 30 Minutes Intravenous Every 6 hours 07/05/22 0909 07/07/22 0913   07/04/22 1400  piperacillin-tazobactam (ZOSYN) IVPB 3.375 g  Status:  Discontinued        3.375 g 12.5 mL/hr over 240 Minutes Intravenous Every 8 hours 07/04/22 0957 07/05/22 0909   07/03/22 1600  DAPTOmycin (CUBICIN) 700 mg in sodium chloride 0.9 % IVPB  Status:  Discontinued        8 mg/kg  89.2 kg (Adjusted) 128 mL/hr over 30 Minutes Intravenous Daily 07/03/22 1512 07/06/22 1004   07/03/22 0000  vancomycin (VANCOREADY) IVPB 1250 mg/250 mL  Status:  Discontinued        1,250 mg 166.7 mL/hr over 90 Minutes Intravenous Every 12 hours 07/02/22 1119 07/03/22 1512   07/02/22 1215  vancomycin (VANCOREADY) IVPB 2000 mg/400 mL        2,000 mg 200  mL/hr over 120 Minutes Intravenous  Once 07/02/22 1119 07/03/22 0144   06/29/22 1200  ceFEPIme (MAXIPIME) 2 g in sodium chloride 0.9 % 100 mL IVPB  Status:  Discontinued        2 g 200 mL/hr over 30 Minutes Intravenous Every 8 hours 06/29/22 1005 07/04/22 0957   06/29/22 1100  metroNIDAZOLE (FLAGYL) tablet 500 mg  Status:  Discontinued        500 mg Oral Every 12 hours 06/29/22 1005 07/04/22 0957   06/28/22 1200  Ampicillin-Sulbactam (UNASYN) 3 g in sodium chloride 0.9 % 100 mL IVPB  Status:  Discontinued        3 g 200 mL/hr over 30 Minutes Intravenous Every 6 hours 06/28/22 0911 06/29/22 1005   06/24/22 0600  ceFEPIme (MAXIPIME) 2 g in sodium chloride 0.9 % 100 mL IVPB  Status:  Discontinued        2 g 200 mL/hr over 30 Minutes Intravenous Every 8 hours 06/23/22 2041 06/28/22 0911   06/24/22 0300  vancomycin (VANCOREADY) IVPB 1250 mg/250 mL  Status:  Discontinued        1,250 mg 166.7 mL/hr over 90 Minutes Intravenous Every 12 hours 06/23/22 1543 06/29/22 0819   06/23/22 2100  ceFEPIme (MAXIPIME) 2 g in sodium chloride 0.9 % 100 mL IVPB        2 g 200 mL/hr over 30 Minutes Intravenous  Once 06/23/22 2006 06/23/22 2153   06/23/22 2100  metroNIDAZOLE (FLAGYL) IVPB 500 mg  Status:  Discontinued        500 mg 100 mL/hr over 60 Minutes Intravenous Every 12 hours 06/23/22 2006 06/28/22 0849   06/23/22 1600  vancomycin (VANCOREADY) IVPB 1500 mg/300 mL        1,500 mg 150 mL/hr over 120 Minutes Intravenous  Once 06/23/22 1514 06/23/22 1912   06/23/22 1430  vancomycin (VANCOCIN) IVPB 1000 mg/200 mL premix        1,000 mg 200 mL/hr over 60 Minutes Intravenous  Once 06/23/22 1418 06/23/22 1624   06/23/22 1430  cefTRIAXone (ROCEPHIN) 2 g in sodium chloride 0.9 % 100 mL IVPB        2 g 200 mL/hr over 30 Minutes Intravenous  Once 06/23/22 1418 06/23/22 1506          Medications  acetaminophen  650 mg Oral Q6H   [START ON 08/11/2022] amoxicillin  500 mg Oral Q8H   amoxicillin-clavulanate  1  tablet Oral Q12H   Chlorhexidine Gluconate Cloth  6 each Topical Daily   enoxaparin (LOVENOX) injection  55 mg Subcutaneous Q24H   feeding supplement  237 mL Oral BID WC   folic acid  1 mg Oral Daily   insulin aspart  0-15 Units Subcutaneous TID WC   insulin aspart  0-5 Units Subcutaneous QHS   insulin aspart protamine- aspart  15 Units Subcutaneous BID WC   leptospermum manuka honey  1 Application Topical Daily   multivitamin with minerals  1 tablet Oral Daily   nicotine  14 mg Transdermal Daily   nutrition supplement (JUVEN)  1 packet Oral BID BM   polyethylene glycol  17 g Oral Daily   saccharomyces boulardii  250 mg Oral BID   sodium chloride flush  10-40 mL Intracatheter Q12H   thiamine  100 mg Oral Daily      Subjective:   Rory Xiang was seen and examined today.  Pain with the dressing changes is his main concern.  At baseline, able to tolerate, 5/10.  No acute fevers, nausea vomiting, abdominal pain.  Objective:   Vitals:   07/08/22 2001 07/09/22 0605 07/09/22 0710 07/09/22 1117  BP: 104/70 117/68 135/81 117/78  Pulse: 100 (!) 111 95 100  Resp: _0 Temp: 98.8 F (37.1 C) 98.9 F (37.2 C) 98.7 F (37.1 C) 97.8 F (36.6 C)  TempSrc: Oral Oral Oral Oral  SpO2: 98% 97% 99% 99%  Weight:      Height:        Intake/Output Summary (Last 24 hours) at 07/09/2022 1216 Last data filed at 07/09/2022 1100 Gross per 24 hour  Intake 237 ml  Output --  Net 237 ml     Wt Readings from Last 3 Encounters:  07/03/22 116.9 kg  09/16/16 124.7 kg  08/06/16 120.2 kg    Physical Exam General: Alert and oriented x 3, NAD Cardiovascular: S1 S2 clear, RRR.  Respiratory: CTAB, no wheezing, rales or rhonchi Gastrointestinal: Soft, nontender, nondistended, NBS. Ext: no pedal edema bilaterally Neuro: no new deficits Skin: Dressing intact to lower left chest wall and flank area Psych: Normal affect     Data Reviewed:  I have personally reviewed following labs     CBC Lab Results  Component Value Date   WBC 6.7 07/08/2022   RBC 3.11 (L) 07/08/2022   HGB 8.7 (L) 07/08/2022   HCT 25.1 (L) 07/08/2022   MCV 80.7 07/08/2022   MCH 28.0 07/08/2022   PLT 750 (H) 07/08/2022   MCHC 34.7 07/08/2022   RDW 14.2 07/08/2022   LYMPHSABS 2.0 07/02/2022   MONOABS 0.7 07/02/2022   EOSABS 0.1 07/02/2022   BASOSABS 0.1 32/91/9166     Last metabolic panel Lab Results  Component Value Date   NA 137 07/08/2022   K 4.4 07/08/2022   CL 98 07/08/2022   CO2 31 07/08/2022   BUN 5 (L) 07/08/2022   CREATININE 0.59 (L) 07/08/2022   GLUCOSE 132 (H) 07/08/2022   GFRNONAA >60 07/08/2022   CALCIUM 8.9 07/08/2022   PROT 7.4 07/06/2022   ALBUMIN 1.6 (L) 07/06/2022   BILITOT 0.3 07/06/2022   ALKPHOS 130 (H) 07/06/2022   AST 32 07/06/2022   ALT 32 07/06/2022   ANIONGAP 8 07/08/2022    CBG (last 3)  Recent Labs    07/08/22 2109 07/09/22 0604 07/09/22 1122  GLUCAP 174* 168* 212*       Radiology Studies: I have personally reviewed the imaging studies  No results found.     Keilly Fatula  Judea Fennimore M.D. [facility] 07/09/2022, 12:16 PM  Available via Epic secure chat 7am-7pm After 7 pm, please refer to night coverage provider listed on amion.

## 2022-07-09 NOTE — Progress Notes (Signed)
Offered to change the dressing on his left breast 2x but refused at this time.

## 2022-07-09 NOTE — Progress Notes (Signed)
6 Days Post-Op   Subjective/Chief Complaint: Doing well today    Objective: Vital signs in last 24 hours: Temp:  [98.7 F (37.1 C)-98.9 F (37.2 C)] 98.7 F (37.1 C) (12/16 0710) Pulse Rate:  [95-111] 95 (12/16 0710) Resp:  [18-20] 18 (12/16 0710) BP: (104-135)/(68-87) 135/81 (12/16 0710) SpO2:  [97 %-99 %] 99 % (12/16 0710) Last BM Date : 07/07/22  Intake/Output from previous day: 12/15 0701 - 12/16 0700 In: 370 [P.O.:360; I.V.:10] Out: -  Intake/Output this shift: No intake/output data recorded.  PE: Gen:  Alert, NAD, pleasant Wound: Left breast/chest wall. 30cm x 9cm x 4cm deep with mixed granulation tissue, fibrinous tissue (more central posterior) and some cauterized tissue. There's an area near the anterior border of the wound that tracks ~4cm cephalad and anteriorly - no purulent drainage from here today. Otherwise no drainage or tracking/tunneling of the wound. He still has some induration along the superior border of his wound. Induration and erythema along the left flank and lateral abdomen are stable/improved from 12/9. Counter incisions x 2 clean without drainage.   Lab Results:  Recent Labs    07/07/22 0714 07/08/22 0903  WBC 9.7 6.7  HGB 8.5* 8.7*  HCT 24.1* 25.1*  PLT 710* 750*   BMET Recent Labs    07/08/22 0903  NA 137  K 4.4  CL 98  CO2 31  GLUCOSE 132*  BUN 5*  CREATININE 0.59*  CALCIUM 8.9   PT/INR No results for input(s): "LABPROT", "INR" in the last 72 hours. ABG No results for input(s): "PHART", "HCO3" in the last 72 hours.  Invalid input(s): "PCO2", "PO2"  Studies/Results: No results found.  Anti-infectives: Anti-infectives (From admission, onward)    Start     Dose/Rate Route Frequency Ordered Stop   08/11/22 0600  amoxicillin (AMOXIL) capsule 500 mg        500 mg Oral Every 8 hours 07/07/22 1135     07/07/22 1200  amoxicillin-clavulanate (AUGMENTIN) 875-125 MG per tablet 1 tablet        1 tablet Oral Every 12 hours  07/07/22 0913 08/10/22 2359   07/05/22 1200  Ampicillin-Sulbactam (UNASYN) 3 g in sodium chloride 0.9 % 100 mL IVPB  Status:  Discontinued        3 g 200 mL/hr over 30 Minutes Intravenous Every 6 hours 07/05/22 0909 07/07/22 0913   07/04/22 1400  piperacillin-tazobactam (ZOSYN) IVPB 3.375 g  Status:  Discontinued        3.375 g 12.5 mL/hr over 240 Minutes Intravenous Every 8 hours 07/04/22 0957 07/05/22 0909   07/03/22 1600  DAPTOmycin (CUBICIN) 700 mg in sodium chloride 0.9 % IVPB  Status:  Discontinued        8 mg/kg  89.2 kg (Adjusted) 128 mL/hr over 30 Minutes Intravenous Daily 07/03/22 1512 07/06/22 1004   07/03/22 0000  vancomycin (VANCOREADY) IVPB 1250 mg/250 mL  Status:  Discontinued        1,250 mg 166.7 mL/hr over 90 Minutes Intravenous Every 12 hours 07/02/22 1119 07/03/22 1512   07/02/22 1215  vancomycin (VANCOREADY) IVPB 2000 mg/400 mL        2,000 mg 200 mL/hr over 120 Minutes Intravenous  Once 07/02/22 1119 07/03/22 0144   06/29/22 1200  ceFEPIme (MAXIPIME) 2 g in sodium chloride 0.9 % 100 mL IVPB  Status:  Discontinued        2 g 200 mL/hr over 30 Minutes Intravenous Every 8 hours 06/29/22 1005 07/04/22 0957   06/29/22 1100  metroNIDAZOLE (FLAGYL) tablet 500 mg  Status:  Discontinued        500 mg Oral Every 12 hours 06/29/22 1005 07/04/22 0957   06/28/22 1200  Ampicillin-Sulbactam (UNASYN) 3 g in sodium chloride 0.9 % 100 mL IVPB  Status:  Discontinued        3 g 200 mL/hr over 30 Minutes Intravenous Every 6 hours 06/28/22 0911 06/29/22 1005   06/24/22 0600  ceFEPIme (MAXIPIME) 2 g in sodium chloride 0.9 % 100 mL IVPB  Status:  Discontinued        2 g 200 mL/hr over 30 Minutes Intravenous Every 8 hours 06/23/22 2041 06/28/22 0911   06/24/22 0300  vancomycin (VANCOREADY) IVPB 1250 mg/250 mL  Status:  Discontinued        1,250 mg 166.7 mL/hr over 90 Minutes Intravenous Every 12 hours 06/23/22 1543 06/29/22 0819   06/23/22 2100  ceFEPIme (MAXIPIME) 2 g in sodium chloride  0.9 % 100 mL IVPB        2 g 200 mL/hr over 30 Minutes Intravenous  Once 06/23/22 2006 06/23/22 2153   06/23/22 2100  metroNIDAZOLE (FLAGYL) IVPB 500 mg  Status:  Discontinued        500 mg 100 mL/hr over 60 Minutes Intravenous Every 12 hours 06/23/22 2006 06/28/22 0849   06/23/22 1600  vancomycin (VANCOREADY) IVPB 1500 mg/300 mL        1,500 mg 150 mL/hr over 120 Minutes Intravenous  Once 06/23/22 1514 06/23/22 1912   06/23/22 1430  vancomycin (VANCOCIN) IVPB 1000 mg/200 mL premix        1,000 mg 200 mL/hr over 60 Minutes Intravenous  Once 06/23/22 1418 06/23/22 1624   06/23/22 1430  cefTRIAXone (ROCEPHIN) 2 g in sodium chloride 0.9 % 100 mL IVPB        2 g 200 mL/hr over 30 Minutes Intravenous  Once 06/23/22 1418 06/23/22 1506       Assessment/Plan: POD 8/6 s/p I&D of 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 Carolynne Edouard and Dr. Janee Morn on 07/01/22 and 07/03/22 respectively  - Wound debridement left chest wall necrotizing soft tissue infection now measures 30cm x 9cm x 4cm deep, incision and drainage left flank x 2  - Cont abx. Cx's from 12/5 (IR) with Prevotella species, beta lactamase positive, moderate actinomyces. Cx from 12/8 with prevotella species, beta lactamase +. Abx selection per ID.  - WTD dressing changes BID with medihoney. Cont hydrotherapy (pulse lavage hydrotherapy)/  - No plans to return to the OR at this time. Continue bedside dressing changes and abx. Will recheck on Monday   FEN: CM diet VTE: SCDs, LMWH ID: Augmentin per ID. Afebrile. WBC wnl.    - below per TRH -  T2DM, uncontrolled - A1c 11.6, SSI Alcohol abuse Tobacco abuse Obesity class II Hyponatremia - s/p lasix  LOS: 16 days    Axel Filler 07/09/2022

## 2022-07-10 DIAGNOSIS — L03313 Cellulitis of chest wall: Secondary | ICD-10-CM | POA: Diagnosis not present

## 2022-07-10 DIAGNOSIS — E1165 Type 2 diabetes mellitus with hyperglycemia: Secondary | ICD-10-CM | POA: Diagnosis not present

## 2022-07-10 DIAGNOSIS — A419 Sepsis, unspecified organism: Secondary | ICD-10-CM | POA: Diagnosis not present

## 2022-07-10 DIAGNOSIS — F1010 Alcohol abuse, uncomplicated: Secondary | ICD-10-CM | POA: Diagnosis not present

## 2022-07-10 LAB — GLUCOSE, CAPILLARY
Glucose-Capillary: 156 mg/dL — ABNORMAL HIGH (ref 70–99)
Glucose-Capillary: 181 mg/dL — ABNORMAL HIGH (ref 70–99)
Glucose-Capillary: 149 mg/dL — ABNORMAL HIGH (ref 70–99)
Glucose-Capillary: 246 mg/dL — ABNORMAL HIGH (ref 70–99)

## 2022-07-10 LAB — BASIC METABOLIC PANEL
Anion gap: 6 (ref 5–15)
BUN: 11 mg/dL (ref 6–20)
CO2: 30 mmol/L (ref 22–32)
Calcium: 9.4 mg/dL (ref 8.9–10.3)
Chloride: 101 mmol/L (ref 98–111)
Creatinine, Ser: 0.68 mg/dL (ref 0.61–1.24)
GFR, Estimated: >60 mL/min (ref >60)
Glucose, Bld: 159 mg/dL — ABNORMAL HIGH (ref 70–99)
Potassium: 4.4 mmol/L (ref 3.5–5.1)
Sodium: 137 mmol/L (ref 135–145)

## 2022-07-10 NOTE — Progress Notes (Signed)
[facility]                                                                              [name], is a 36 y.o. male, DOB - [birth year], KKX:[id number] Admit date - 06/23/2022    Outpatient Primary MD for the patient is Patient, [name]  LOS - 17  days  Chief Complaint  Patient presents with   Abscess       Brief summary    Patient is a 36 year old male with history of obesity, EtOH use comes into the hospital with left chest wall cellulitis and sepsis. He reports initially there was a boil in the area that popped open, and following that the whole area started to get worse.  12/5: Underwent aspiration 12/8, 12/10: Underwent debridement by surgery   Assessment & Plan    Principal Problem: Sepsis due to chest wall cellulitis/abscess, necrotizing soft tissue infection -Ultrasound chest showed possible abscess, IR initially consulted, status post aspiration on 5/5 -General surgery consulted, underwent I&D in OR on 5/8, repeated debridement on 12/10 -ID consulted, surgical cultures with prevotella, beta-lactamase species, actinomyces, on daptomycin and Unasyn, now narrowed to Augmentin.   -will need prolonged therapy for at least 6 months given the actinomyces, plan to start amoxicillin on 08/11/2022. -Has PICC line  -Per surgery, no plans for repeat I&D, continue dressing changes, wound care following -Continue Augmentin, pain better controlled today    Active problems Diabetes mellitus, poorly controlled with hyperglycemia, new diagnosis -Hemoglobin A1c 11.6 on 06/24/2022 -Likely will need insulin for short-term until A1c <8 for better healing of the wound.  Patient is agreeable for home insulin -CBGs better, continue NovoLog 70/30 15 units twice daily, SSI.  Needs PCP at discharge.  Recent Labs    07/09/22 0604 07/09/22 1122 07/09/22 1631 07/09/22 2123 07/10/22 0615 07/10/22 1047  GLUCAP 168* 212* 229* 152* 156* 181*     Hyponatremia -Likely  due to fluid overload, resolved with Lasix   Mild transaminitis, alk phos -In the setting of acute illness, improving -AST ALT normal  Microcytic anemia -H&H stable   Alcohol and tobacco use -Recommended cessation Continue nicotine patch   Obesity Estimated body mass index is 38.04 kg/m as calculated from the following:   Height as of this encounter: 5' 9.02" (1.753 m).   Weight as of this encounter: 116.9 kg.  Code Status: Full CODE STATUS DVT Prophylaxis:    Level of Care: Level of care: Med-Surg Family Communication: Updated patient   Disposition Plan:      Remains inpatient appropriate: Not medically ready for discharge   Procedures:  I&D x 2  Consultants:   IR Infectious disease Surgery  Antimicrobials:   Anti-infectives (From admission, onward)    Start     Dose/Rate Route Frequency Ordered Stop   08/11/22 0600  amoxicillin (AMOXIL) capsule 500 mg        500 mg Oral Every 8 hours 07/07/22 1135     07/07/22 1200  amoxicillin-clavulanate (AUGMENTIN) 875-125 MG per tablet 1 tablet        1 tablet Oral Every 12 hours 07/07/22 0913 08/10/22 2359  07/05/22 1200  Ampicillin-Sulbactam (UNASYN) 3 g in sodium chloride 0.9 % 100 mL IVPB  Status:  Discontinued        3 g 200 mL/hr over 30 Minutes Intravenous Every 6 hours 07/05/22 0909 07/07/22 0913   07/04/22 1400  piperacillin-tazobactam (ZOSYN) IVPB 3.375 g  Status:  Discontinued        3.375 g 12.5 mL/hr over 240 Minutes Intravenous Every 8 hours 07/04/22 0957 07/05/22 0909   07/03/22 1600  DAPTOmycin (CUBICIN) 700 mg in sodium chloride 0.9 % IVPB  Status:  Discontinued        8 mg/kg  89.2 kg (Adjusted) 128 mL/hr over 30 Minutes Intravenous Daily 07/03/22 1512 07/06/22 1004   07/03/22 0000  vancomycin (VANCOREADY) IVPB 1250 mg/250 mL  Status:  Discontinued        1,250 mg 166.7 mL/hr over 90 Minutes Intravenous Every 12 hours 07/02/22 1119 07/03/22 1512   07/02/22 1215  vancomycin (VANCOREADY) IVPB 2000  mg/400 mL        2,000 mg 200 mL/hr over 120 Minutes Intravenous  Once 07/02/22 1119 07/03/22 0144   06/29/22 1200  ceFEPIme (MAXIPIME) 2 g in sodium chloride 0.9 % 100 mL IVPB  Status:  Discontinued        2 g 200 mL/hr over 30 Minutes Intravenous Every 8 hours 06/29/22 1005 07/04/22 0957   06/29/22 1100  metroNIDAZOLE (FLAGYL) tablet 500 mg  Status:  Discontinued        500 mg Oral Every 12 hours 06/29/22 1005 07/04/22 0957   06/28/22 1200  Ampicillin-Sulbactam (UNASYN) 3 g in sodium chloride 0.9 % 100 mL IVPB  Status:  Discontinued        3 g 200 mL/hr over 30 Minutes Intravenous Every 6 hours 06/28/22 0911 06/29/22 1005   06/24/22 0600  ceFEPIme (MAXIPIME) 2 g in sodium chloride 0.9 % 100 mL IVPB  Status:  Discontinued        2 g 200 mL/hr over 30 Minutes Intravenous Every 8 hours 06/23/22 2041 06/28/22 0911   06/24/22 0300  vancomycin (VANCOREADY) IVPB 1250 mg/250 mL  Status:  Discontinued        1,250 mg 166.7 mL/hr over 90 Minutes Intravenous Every 12 hours 06/23/22 1543 06/29/22 0819   06/23/22 2100  ceFEPIme (MAXIPIME) 2 g in sodium chloride 0.9 % 100 mL IVPB        2 g 200 mL/hr over 30 Minutes Intravenous  Once 06/23/22 2006 06/23/22 2153   06/23/22 2100  metroNIDAZOLE (FLAGYL) IVPB 500 mg  Status:  Discontinued        500 mg 100 mL/hr over 60 Minutes Intravenous Every 12 hours 06/23/22 2006 06/28/22 0849   06/23/22 1600  vancomycin (VANCOREADY) IVPB 1500 mg/300 mL        1,500 mg 150 mL/hr over 120 Minutes Intravenous  Once 06/23/22 1514 06/23/22 1912   06/23/22 1430  vancomycin (VANCOCIN) IVPB 1000 mg/200 mL premix        1,000 mg 200 mL/hr over 60 Minutes Intravenous  Once 06/23/22 1418 06/23/22 1624   06/23/22 1430  cefTRIAXone (ROCEPHIN) 2 g in sodium chloride 0.9 % 100 mL IVPB        2 g 200 mL/hr over 30 Minutes Intravenous  Once 06/23/22 1418 06/23/22 1506          Medications  acetaminophen  650 mg Oral Q6H   [START ON 08/11/2022] amoxicillin  500 mg Oral  Q8H   amoxicillin-clavulanate  1 tablet Oral  Q12H   Chlorhexidine Gluconate Cloth  6 each Topical Daily   enoxaparin (LOVENOX) injection  60 mg Subcutaneous Q24H   feeding supplement  237 mL Oral BID WC   folic acid  1 mg Oral Daily   insulin aspart  0-15 Units Subcutaneous TID WC   insulin aspart  0-5 Units Subcutaneous QHS   insulin aspart protamine- aspart  15 Units Subcutaneous BID WC   leptospermum manuka honey  1 Application Topical Daily   multivitamin with minerals  1 tablet Oral Daily   nicotine  14 mg Transdermal Daily   nutrition supplement (JUVEN)  1 packet Oral BID BM   polyethylene glycol  17 g Oral Daily   saccharomyces boulardii  250 mg Oral BID   sodium chloride flush  10-40 mL Intracatheter Q12H   thiamine  100 mg Oral Daily      Subjective:   [name] was seen and examined today.  Pain better controlled today, no acute complaints.  Sitting up in the chair.  Objective:   Vitals:   07/09/22 1117 07/09/22 1532 07/10/22 0615 07/10/22 0856  BP: 117/78 121/75 (!) 108/59 114/71  Pulse: 100  96 (!) 114  Resp: _0 Temp: 97.8 F (36.6 C) 98.9 F (37.2 C) 98.1 F (36.7 C) 98.2 F (36.8 C)  TempSrc: Oral Oral Oral Oral  SpO2: 99%  95% 97%  Weight:      Height:       No intake or output data in the 24 hours ending 07/10/22 1341    Wt Readings from Last 3 Encounters:  07/03/22 116.9 kg  09/16/16 124.7 kg  08/06/16 120.2 kg   Physical Exam General: Alert and oriented x 3, NAD Cardiovascular: S1 S2 clear, RRR.  Respiratory: CTAB Gastrointestinal: Soft, nontender, nondistended, NBS Ext: no pedal edema bilaterally Neuro: no new deficits Skin: Dressing to the lower left chest wall and flank area     Data Reviewed:  I have personally reviewed following labs    CBC Lab Results  Component Value Date   WBC 6.7 07/08/2022   RBC 3.11 (L) 07/08/2022   HGB 8.7 (L) 07/08/2022   HCT 25.1 (L) 07/08/2022   MCV 80.7 07/08/2022   MCH 28.0  07/08/2022   PLT 750 (H) 07/08/2022   MCHC 34.7 07/08/2022   RDW 14.2 07/08/2022   LYMPHSABS 2.0 07/02/2022   MONOABS 0.7 07/02/2022   EOSABS 0.1 07/02/2022   BASOSABS 0.1 02/58/5277     Last metabolic panel Lab Results  Component Value Date   NA 137 07/10/2022   K 4.4 07/10/2022   CL 101 07/10/2022   CO2 30 07/10/2022   BUN 11 07/10/2022   CREATININE 0.68 07/10/2022   GLUCOSE 159 (H) 07/10/2022   GFRNONAA >60 07/10/2022   CALCIUM 9.4 07/10/2022   PROT 7.4 07/06/2022   ALBUMIN 1.6 (L) 07/06/2022   BILITOT 0.3 07/06/2022   ALKPHOS 130 (H) 07/06/2022   AST 32 07/06/2022   ALT 32 07/06/2022   ANIONGAP 6 07/10/2022    CBG (last 3)  Recent Labs    07/09/22 2123 07/10/22 0615 07/10/22 1047  GLUCAP 152* 156* 181*       Radiology Studies: I have personally reviewed the imaging studies  No results found.     Estill Cotta M.D. [facility] 07/10/2022, 1:41 PM  Available via Epic secure chat 7am-7pm After 7 pm, please refer to night coverage provider listed on amion.

## 2022-07-10 NOTE — Plan of Care (Signed)
  Problem: Education: Goal: Ability to describe self-care measures that may prevent or decrease complications (Diabetes Survival Skills Education) will improve Outcome: Progressing   Problem: Coping: Goal: Ability to adjust to condition or change in health will improve Outcome: Progressing   Problem: Fluid Volume: Goal: Ability to maintain a balanced intake and output will improve Outcome: Progressing   Problem: Metabolic: Goal: Ability to maintain appropriate glucose levels will improve Outcome: Progressing   Problem: Nutritional: Goal: Maintenance of adequate nutrition will improve Outcome: Progressing   Problem: Tissue Perfusion: Goal: Adequacy of tissue perfusion will improve Outcome: Progressing

## 2022-07-11 DIAGNOSIS — L03313 Cellulitis of chest wall: Secondary | ICD-10-CM | POA: Diagnosis not present

## 2022-07-11 LAB — BASIC METABOLIC PANEL
Anion gap: 7 (ref 5–15)
BUN: 12 mg/dL (ref 6–20)
CO2: 29 mmol/L (ref 22–32)
Calcium: 9.4 mg/dL (ref 8.9–10.3)
Chloride: 100 mmol/L (ref 98–111)
Creatinine, Ser: 0.74 mg/dL (ref 0.61–1.24)
GFR, Estimated: >60 mL/min (ref >60)
Glucose, Bld: 146 mg/dL — ABNORMAL HIGH (ref 70–99)
Potassium: 4.4 mmol/L (ref 3.5–5.1)
Sodium: 136 mmol/L (ref 135–145)

## 2022-07-11 LAB — GLUCOSE, CAPILLARY
Glucose-Capillary: 138 mg/dL — ABNORMAL HIGH (ref 70–99)
Glucose-Capillary: 146 mg/dL — ABNORMAL HIGH (ref 70–99)
Glucose-Capillary: 204 mg/dL — ABNORMAL HIGH (ref 70–99)
Glucose-Capillary: 184 mg/dL — ABNORMAL HIGH (ref 70–99)
Glucose-Capillary: 203 mg/dL — ABNORMAL HIGH (ref 70–99)

## 2022-07-11 LAB — CBC
HCT: 28.9 % — ABNORMAL LOW (ref 39.0–52.0)
Hemoglobin: 9.6 g/dL — ABNORMAL LOW (ref 13.0–17.0)
MCH: 27 pg (ref 26.0–34.0)
MCHC: 33.2 g/dL (ref 30.0–36.0)
MCV: 81.2 fL (ref 80.0–100.0)
Platelets: 756 10*3/uL — ABNORMAL HIGH (ref 150–400)
RBC: 3.56 MIL/uL — ABNORMAL LOW (ref 4.22–5.81)
RDW: 14.3 % (ref 11.5–15.5)
WBC: 8 10*3/uL (ref 4.0–10.5)
nRBC: 0 % (ref 0.0–0.2)

## 2022-07-11 LAB — BRAIN NATRIURETIC PEPTIDE: B Natriuretic Peptide: 16.4 pg/mL (ref 0.0–100.0)

## 2022-07-11 LAB — PROCALCITONIN: Procalcitonin: <0.1 ng/mL

## 2022-07-11 LAB — C-REACTIVE PROTEIN: CRP: 2.1 mg/dL — ABNORMAL HIGH (ref <1.0)

## 2022-07-11 LAB — MAGNESIUM: Magnesium: 1.8 mg/dL (ref 1.7–2.4)

## 2022-07-11 NOTE — Plan of Care (Signed)
  Problem: Education: Goal: Ability to describe self-care measures that may prevent or decrease complications (Diabetes Survival Skills Education) will improve Outcome: Progressing Goal: Individualized Educational Video(s) Outcome: Progressing   Problem: Coping: Goal: Ability to adjust to condition or change in health will improve Outcome: Progressing   Problem: Fluid Volume: Goal: Ability to maintain a balanced intake and output will improve Outcome: Progressing   Problem: Health Behavior/Discharge Planning: Goal: Ability to identify and utilize available resources and services will improve Outcome: Progressing Goal: Ability to manage health-related needs will improve Outcome: Progressing   Problem: Metabolic: Goal: Ability to maintain appropriate glucose levels will improve Outcome: Progressing   Problem: Nutritional: Goal: Maintenance of adequate nutrition will improve Outcome: Progressing Goal: Progress toward achieving an optimal weight will improve Outcome: Progressing   Problem: Skin Integrity: Goal: Risk for impaired skin integrity will decrease Outcome: Progressing   Problem: Tissue Perfusion: Goal: Adequacy of tissue perfusion will improve Outcome: Progressing   Problem: Clinical Measurements: Goal: Ability to avoid or minimize complications of infection will improve Outcome: Progressing   Problem: Skin Integrity: Goal: Skin integrity will improve Outcome: Progressing   Problem: Education: Goal: Knowledge of General Education information will improve Description: Including pain rating scale, medication(s)/side effects and non-pharmacologic comfort measures Outcome: Progressing   Problem: Health Behavior/Discharge Planning: Goal: Ability to manage health-related needs will improve Outcome: Progressing   Problem: Clinical Measurements: Goal: Ability to maintain clinical measurements within normal limits will improve Outcome: Progressing Goal: Will  remain free from infection Outcome: Progressing Goal: Diagnostic test results will improve Outcome: Progressing Goal: Respiratory complications will improve Outcome: Progressing Goal: Cardiovascular complication will be avoided Outcome: Progressing   Problem: Activity: Goal: Risk for activity intolerance will decrease Outcome: Progressing   Problem: Nutrition: Goal: Adequate nutrition will be maintained Outcome: Progressing   Problem: Coping: Goal: Level of anxiety will decrease Outcome: Progressing   Problem: Elimination: Goal: Will not experience complications related to bowel motility Outcome: Progressing Goal: Will not experience complications related to urinary retention Outcome: Progressing   Problem: Pain Managment: Goal: General experience of comfort will improve Outcome: Progressing   Problem: Safety: Goal: Ability to remain free from injury will improve Outcome: Progressing   Problem: Skin Integrity: Goal: Risk for impaired skin integrity will decrease Outcome: Progressing   

## 2022-07-11 NOTE — Evaluation (Signed)
Physical Therapy Evaluation Patient Details Name: [name] MRN: [id number] DOB: 1985-08-12 Today's Date: 07/11/2022  History of Present Illness  Patient is a 36 y/o male who presents on 11/30 with left chest wall cellulitis and sepsis. s/p aspiration 12/5, s/p debridement 12/8 and 12/10, being following by hydrotherapy. PMH includes asthma, obesity, EtOH use.  Clinical Impression  Patient is independent and lives at home with his mother PTA. Today, pt tolerated transfers and ambulation mod I for safety. Pt able to perform higher level balance activities - walking backwards, head turns, stepping over objects, changes in direction with only mild deviations in gait but no overt LOB. Pt reports he is functioning at 70% of his baseline today. Pt's only concern is being able to manage and dress his wound. Will defer to hydro and MD. Pt does not require further skilled therapy services as pt functioning close to baseline. All education completed. Discharge from therapy.       Recommendations for follow up therapy are one component of a multi-disciplinary discharge planning process, led by the attending physician.  Recommendations may be updated based on patient status, additional functional criteria and insurance authorization.  Follow Up Recommendations No PT follow up      Assistance Recommended at Discharge PRN  Patient can return home with the following       Equipment Recommendations None recommended by PT  Recommendations for Other Services       Functional Status Assessment Patient has not had a recent decline in their functional status     Precautions / Restrictions Precautions Precautions: None Restrictions Weight Bearing Restrictions: No      Mobility  Bed Mobility Overal bed mobility: Modified Independent             General bed mobility comments: No assist needed, HOB flat.    Transfers Overall transfer level: Modified independent                  General transfer comment: No assist needed, stood from EOB x1, from toilet x1.    Ambulation/Gait Ambulation/Gait assistance: Modified independent (Device/Increase time) Gait Distance (Feet): 300 Feet Assistive device: None Gait Pattern/deviations: Step-through pattern, Decreased stride length, Wide base of support Gait velocity: slow walker at baseline Gait velocity interpretation: <1.31 ft/sec, indicative of household ambulator   General Gait Details: Slow, mostly steady gait with wide BOS, tolerated higher level balance activities with mild deviations in gait, see balance section for details.  Stairs            Wheelchair Mobility    Modified Rankin (Stroke Patients Only)       Balance Overall balance assessment: Mild deficits observed, not formally tested, Needs assistance   Sitting balance-Leahy Scale: Normal Sitting balance - Comments: Able to donn socks without difficulty     Standing balance-Leahy Scale: Good               High level balance activites: Backward walking, Direction changes, Turns, Sudden stops, Head turns High Level Balance Comments: Tolerated above with mild deviations in gait but no overt LOB. Able to step over object without difficulty.             Pertinent Vitals/Pain Pain Assessment Pain Assessment: No/denies pain    Home Living Family/patient expects to be discharged to:: Private residence Living Arrangements: Parent Available Help at Discharge: Family Type of Home: House Home Access: Stairs to enter   Entrance Stairs-Number of Steps: 1   Home Layout: One level  Home Equipment: Rolling Walker (2 wheels);BSC/3in1 Additional Comments: Mother is elderly and has a hard time walking, will not be of any assistance.    Prior Function Prior Level of Function : Independent/Modified Independent             Mobility Comments: independent, medical equipment delivery, physical job ADLs Comments: independent     Hand  Dominance        Extremity/Trunk Assessment   Upper Extremity Assessment Upper Extremity Assessment: Defer to OT evaluation    Lower Extremity Assessment Lower Extremity Assessment: Generalized weakness (but functional)    Cervical / Trunk Assessment Cervical / Trunk Assessment: Normal  Communication   Communication: No difficulties  Cognition Arousal/Alertness: Awake/alert Behavior During Therapy: WFL for tasks assessed/performed Overall Cognitive Status: Within Functional Limits for tasks assessed                                          General Comments      Exercises     Assessment/Plan    PT Assessment Patient does not need any further PT services  PT Problem List         PT Treatment Interventions      PT Goals (Current goals can be found in the Care Plan section)  Acute Rehab PT Goals Patient Stated Goal: to be able to manage his wound at home PT Goal Formulation: All assessment and education complete, DC therapy    Frequency       Co-evaluation               AM-PAC PT "6 Clicks" Mobility  Outcome Measure Help needed turning from your back to your side while in a flat bed without using bedrails?: None Help needed moving from lying on your back to sitting on the side of a flat bed without using bedrails?: None Help needed moving to and from a bed to a chair (including a wheelchair)?: None Help needed standing up from a chair using your arms (e.g., wheelchair or bedside chair)?: None Help needed to walk in hospital room?: None Help needed climbing 3-5 steps with a railing? : None 6 Click Score: 24    End of Session   Activity Tolerance: Patient tolerated treatment well Patient left: in bed;with call bell/phone within reach Nurse Communication: Mobility status PT Visit Diagnosis: Difficulty in walking, not elsewhere classified (R26.2)    Time: 0109-3235 PT Time Calculation (min) (ACUTE ONLY): 18 min   Charges:   PT  Evaluation $PT Eval Low Complexity: 1 Low          Vale Haven, PT, DPT Acute Rehabilitation Services Secure chat preferred Office [phone]     Blake Divine A Tarvaris Puglia 07/11/2022, 9:58 AM

## 2022-07-11 NOTE — Progress Notes (Signed)
Inpatient Diabetes Program Recommendations  AACE/ADA: New Consensus Statement on Inpatient Glycemic Control (2015)  Target Ranges:  Prepandial:   less than 140 mg/dL      Peak postprandial:   less than 180 mg/dL (1-2 hours)      Critically ill patients:  140 - 180 mg/dL   Lab Results  Component Value Date   GLUCAP 204 (H) 07/11/2022   HGBA1C 11.6 (H) 06/24/2022    Review of Glycemic Control  Latest Reference Range & Units 07/10/22 10:47 07/10/22 16:53 07/10/22 21:07 07/11/22 05:57 07/11/22 07:55 07/11/22 12:03  Glucose-Capillary 70 - 99 mg/dL 650 (H) 354 (H) 656 (H) 138 (H) 146 (H) 204 (H)   Diabetes history: DM- NEW DIAGNOSIS Current orders for Inpatient glycemic control:  Novolog moderate tid with meals and HS Novolog 70/30 15 units bid  Inpatient Diabetes Program Recommendations:    Diabetes coordinator's have seen patient several times during hospitalization to discuss DM diagnosis, management, insulin and monitoring.  Based on benefits check, 70/30 may be more affordable for patient?  Reviewed with patient the importance of monitoring and asked if he is interested in CGM.  He states that at this time he just wants to do finger sticks b/c he is afraid of the sensor falling off.  We reviewed normal glucose values and signs and symptoms of both high blood sugars and low blood sugars.  Patient was able to teach back normal glucose values and also correct treatment of low blood sugars with 4 oz of juice or regular soda.    At discharge consider ordering:  1) 70/30 Flex pen insulin- (Order [id number]) 2) insulin pen needles (Order [id number]) 3) glucose meter and supplies (Order # [id number])  Needs close f/u with PCP regarding DM management and medication adjustments.   Thanks,  Beryl Meager, RN, BC-ADM Inpatient Diabetes Coordinator Pager [phone]  (8a-5p)

## 2022-07-11 NOTE — TOC Progression Note (Addendum)
Transition of Care [facility]) - Progression Note    Patient Details  Name: [name] MRN: [id number] Date of Birth: [birth year]  [facility] CM/SW Contact  Leone Haven, RN Phone Number: 07/11/2022, 3:40 PM  Clinical Narrative:    Patient started on amoxicillian today, will need po abx for 6 months per ID, has follow up [facility] at 9:40 and for new patient establishment. Also has apt at [facility] wound clinic on 1/8 , will need HHRN and HHPT orders.  Per Vascular , thinking may do a wound vac which will benefit patient.  NCM  offered choice, patient does not have a preference, NCM made referral to [facility] with [facility], awaiting to hear back from her. Tresa Endo is not able to take referral.  NCM made rerferral to Amy with [facility], she is able to take referral for [facility] for wound care and HHPT.          Expected Discharge Plan and Services                                                 Social Determinants of Health (SDOH) Interventions    Readmission Risk Interventions     No data to display

## 2022-07-11 NOTE — Progress Notes (Signed)
[facility]                                                                              [name], is a 36 y.o. male, DOB - 09-02-1985, OYD:[id number] Admit date - 06/23/2022    Outpatient Primary MD for the patient is Patient, [name]  LOS - 18  days  Chief Complaint  Patient presents with   Abscess       Brief summary    Patient is a 36 year old male with history of obesity, EtOH use comes into the hospital with left chest wall cellulitis and sepsis. He reports initially there was a boil in the area that popped open, and following that the whole area started to get worse.  12/5: Underwent aspiration 12/8, 12/10: Underwent debridement by surgery   Assessment & Plan    Principal Problem: Sepsis due to chest wall cellulitis/abscess, necrotizing soft tissue infection -Ultrasound chest showed possible abscess, IR initially consulted, status post aspiration on 5/5, he was seen by general surgery and underwent I&D in OR on 5/8, repeated debridement on 12/10 also seen by ID.  Initially was treated for cultures growing prevotella, beta-lactamase species, actinomyces, with daptomycin and Unasyn, now narrowed to Augmentin.   -will need prolonged therapy for at least 6 months given the actinomyces, plan to start amoxicillin on 08/11/2022. -Has PICC line, will be removed prior to discharge. -Per surgery, no plans for repeat I&D, continue dressing changes, wound care following -Continue Augmentin, pain better controlled today, likely discharge in the next 1 to 2 days.   Hyponatremia -Likely due to fluid overload, resolved with Lasix   Mild transaminitis, alk phos -In the setting of acute illness, improving -AST ALT normal  Microcytic anemia -H&H stable   Alcohol and tobacco use -Recommended cessation Continue nicotine patch  Diabetes mellitus, poorly controlled with hyperglycemia, new diagnosis -Hemoglobin A1c 11.6 on 06/24/2022, -Likely will need insulin for  short-term until A1c <8 for better healing of the wound.  Patient is agreeable for home insulin, CBGs better, continue NovoLog 70/30 15 units twice daily, SSI.  Needs PCP at discharge.  CBG (last 3)  Recent Labs    07/10/22 2107 07/11/22 0557 07/11/22 0755  GLUCAP 246* 138* 146*      Obesity Estimated body mass index is 38.04 kg/m as calculated from the following:   Height as of this encounter: 5' 9.02" (1.753 m).   Weight as of this encounter: 116.9 kg.  Code Status: Full CODE STATUS DVT Prophylaxis:    Level of Care: Level of care: Med-Surg Family Communication: Updated patient   Disposition Plan:      Remains inpatient appropriate: Not medically ready for discharge   Procedures:  I&D x 2  Consultants:   IR Infectious disease Surgery  Medications  acetaminophen  650 mg Oral Q6H   [START ON 08/11/2022] amoxicillin  500 mg Oral Q8H   amoxicillin-clavulanate  1 tablet Oral Q12H   Chlorhexidine Gluconate Cloth  6 each Topical Daily   enoxaparin (LOVENOX) injection  60 mg Subcutaneous Q24H   feeding supplement  237 mL Oral BID WC   folic acid  1 mg Oral Daily   insulin aspart  0-15 Units Subcutaneous TID WC   insulin aspart  0-5 Units Subcutaneous QHS   insulin aspart protamine- aspart  15 Units Subcutaneous BID WC   leptospermum manuka honey  1 Application Topical Daily   multivitamin with minerals  1 tablet Oral Daily   nicotine  14 mg Transdermal Daily   nutrition supplement (JUVEN)  1 packet Oral BID BM   polyethylene glycol  17 g Oral Daily   saccharomyces boulardii  250 mg Oral BID   sodium chloride flush  10-40 mL Intracatheter Q12H   thiamine  100 mg Oral Daily      Subjective:   Patient in bed, appears comfortable, denies any headache, no fever, no chest pain or pressure, no shortness of breath , no abdominal pain. No focal weakness.  Objective:   Vitals:   07/10/22 0856 07/10/22 1952 07/11/22 0555 07/11/22 0752  BP: 114/71 116/71 104/80 113/66   Pulse: (!) 114 (!) 114  (!) 103  Resp: _0 Temp: 98.2 F (36.8 C) 99.3 F (37.4 C) 97.6 F (36.4 C) 97.9 F (36.6 C)  TempSrc: Oral Oral Oral Oral  SpO2: 97% 95% 96% 100%  Weight:      Height:        Intake/Output Summary (Last 24 hours) at 07/11/2022 1014 Last data filed at 07/11/2022 0600 Gross per 24 hour  Intake 120 ml  Output --  Net 120 ml      Wt Readings from Last 3 Encounters:  07/03/22 116.9 kg  09/16/16 124.7 kg  08/06/16 120.2 kg   Physical Exam  Awake Alert, No new F.N deficits, Normal affect [place].AT,PERRAL Supple Neck, No JVD,   Symmetrical Chest wall movement, Good air movement bilaterally, CTAB RRR,No Gallops, Rubs or new Murmurs,  +ve B.Sounds, Abd Soft, No tenderness,   Left chest wall wound under bandage, minimal to no surrounding cellulitis   Data Reviewed:  I have personally reviewed following labs   Recent Labs  Lab 07/05/22 1143 07/06/22 0550 07/07/22 0714 07/08/22 0903 07/11/22 0555  WBC 17.6* 12.2* 9.7 6.7 8.0  HGB 9.5* 8.5* 8.5* 8.7* 9.6*  HCT 26.1* 24.5* 24.1* 25.1* 28.9*  PLT 736* 719* 710* 750* 756*  MCV 77.2* 78.3* 78.8* 80.7 81.2  MCH 28.1 27.2 27.8 28.0 27.0  MCHC 36.4* 34.7 35.3 34.7 33.2  RDW 13.7 13.8 13.9 14.2 14.3    Recent Labs  Lab 07/04/22 1627 07/05/22 1457 07/06/22 0550 07/08/22 0903 07/10/22 0506 07/11/22 0555  NA 135 135 135 137 137 136  K 4.0 3.6 3.8 4.4 4.4 4.4  CL 97* 96* 99 98 101 100  CO2 _1 ANIONGAP _2 GLUCOSE 163* 199* 161* 132* 159* 146*  BUN _3 5* 11 12  CREATININE 0.78 0.68 0.67 0.59* 0.68 0.74  AST 36 30 32  --   --   --   ALT 37 32 32  --   --   --   ALKPHOS 174* 155* 130*  --   --   --   BILITOT 0.8 0.4 0.3  --   --   --   ALBUMIN 1.6* 1.6* 1.6*  --   --   --   CRP  --   --   --   --   --  2.1*  PROCALCITON  --   --   --   --   --  <  0.10  BNP  --   --   --   --   --  16.4  MG  --   --  2.0  --   --  1.8  CALCIUM 8.3* 8.5* 8.3* 8.9 9.4 9.4    Radiology Studies: I have personally reviewed the imaging studies  No results found.  Signature  -    Lala Lund M.D on 07/11/2022 at 10:14 AM   -  To page go to [email or web address]

## 2022-07-12 DIAGNOSIS — L03313 Cellulitis of chest wall: Secondary | ICD-10-CM | POA: Diagnosis not present

## 2022-07-12 LAB — GLUCOSE, CAPILLARY
Glucose-Capillary: 171 mg/dL — ABNORMAL HIGH (ref 70–99)
Glucose-Capillary: 160 mg/dL — ABNORMAL HIGH (ref 70–99)
Glucose-Capillary: 117 mg/dL — ABNORMAL HIGH (ref 70–99)
Glucose-Capillary: 132 mg/dL — ABNORMAL HIGH (ref 70–99)

## 2022-07-12 LAB — CBC WITH DIFFERENTIAL/PLATELET
Abs Immature Granulocytes: 0.01 10*3/uL (ref 0.00–0.07)
Basophils Absolute: 0.1 10*3/uL (ref 0.0–0.1)
Basophils Relative: 1 %
Eosinophils Absolute: 0.1 10*3/uL (ref 0.0–0.5)
Eosinophils Relative: 2 %
HCT: 26.7 % — ABNORMAL LOW (ref 39.0–52.0)
Hemoglobin: 9 g/dL — ABNORMAL LOW (ref 13.0–17.0)
Immature Granulocytes: 0 %
Lymphocytes Relative: 40 %
Lymphs Abs: 2.2 10*3/uL (ref 0.7–4.0)
MCH: 27.4 pg (ref 26.0–34.0)
MCHC: 33.7 g/dL (ref 30.0–36.0)
MCV: 81.2 fL (ref 80.0–100.0)
Monocytes Absolute: 0.5 10*3/uL (ref 0.1–1.0)
Monocytes Relative: 8 %
Neutro Abs: 2.7 10*3/uL (ref 1.7–7.7)
Neutrophils Relative %: 49 %
Platelets: 692 10*3/uL — ABNORMAL HIGH (ref 150–400)
RBC: 3.29 MIL/uL — ABNORMAL LOW (ref 4.22–5.81)
RDW: 14.1 % (ref 11.5–15.5)
WBC: 5.6 10*3/uL (ref 4.0–10.5)
nRBC: 0 % (ref 0.0–0.2)

## 2022-07-12 LAB — BRAIN NATRIURETIC PEPTIDE: B Natriuretic Peptide: 8.6 pg/mL (ref 0.0–100.0)

## 2022-07-12 LAB — MAGNESIUM: Magnesium: 1.8 mg/dL (ref 1.7–2.4)

## 2022-07-12 LAB — C-REACTIVE PROTEIN: CRP: 2.1 mg/dL — ABNORMAL HIGH (ref <1.0)

## 2022-07-12 LAB — PROCALCITONIN: Procalcitonin: <0.1 ng/mL

## 2022-07-12 NOTE — Progress Notes (Signed)
9 Days Post-Op  Subjective: Seen with hydrotherapy. Wound cleaning up well, does not have much support for home dressing changes. He does report his child's mother is a CNA and might be able to help some if he does not go home with a VAC.   Objective: Vital signs in last 24 hours: Temp:  [97.8 F (36.6 C)-98.5 F (36.9 C)] 98.5 F (36.9 C) (12/19 0329) Pulse Rate:  [83-106] 83 (12/19 0329) Resp:  [20] 20 (12/18 2005) BP: (106-130)/(65-85) 130/65 (12/19 0329) SpO2:  [94 %-100 %] 100 % (12/18 2005) Last BM Date : 07/09/22  Intake/Output from previous day: No intake/output data recorded. Intake/Output this shift: No intake/output data recorded.  PE: Gen:  Alert, NAD, pleasant Wound: Left breast/chest wall. 28cm x 9.5 cm x 6 cm deep with mixed granulation tissue, fibrinous tissue is cleaning up some since starting hydrotherapy. Undermining of 3 cm posteriorly. No purulent drainage. Minimal induration around wound         Lab Results:  Recent Labs    07/11/22 0555 07/12/22 0605  WBC 8.0 5.6  HGB 9.6* 9.0*  HCT 28.9* 26.7*  PLT 756* 692*    BMET Recent Labs    07/10/22 0506 07/11/22 0555  NA 137 136  K 4.4 4.4  CL 101 100  CO2 30 29  GLUCOSE 159* 146*  BUN 11 12  CREATININE 0.68 0.74  CALCIUM 9.4 9.4    PT/INR No results for input(s): "LABPROT", "INR" in the last 72 hours. CMP     Component Value Date/Time   NA 136 07/11/2022 0555   K 4.4 07/11/2022 0555   CL 100 07/11/2022 0555   CO2 29 07/11/2022 0555   GLUCOSE 146 (H) 07/11/2022 0555   BUN 12 07/11/2022 0555   CREATININE 0.74 07/11/2022 0555   CALCIUM 9.4 07/11/2022 0555   PROT 7.4 07/06/2022 0550   ALBUMIN 1.6 (L) 07/06/2022 0550   AST 32 07/06/2022 0550   ALT 32 07/06/2022 0550   ALKPHOS 130 (H) 07/06/2022 0550   BILITOT 0.3 07/06/2022 0550   GFRNONAA >60 07/11/2022 0555   Lipase  No results found for: "LIPASE"  Studies/Results: No results  found.  Anti-infectives: Anti-infectives (From admission, onward)    Start     Dose/Rate Route Frequency Ordered Stop   08/11/22 0600  amoxicillin (AMOXIL) capsule 500 mg        500 mg Oral Every 8 hours 07/07/22 1135     07/07/22 1200  amoxicillin-clavulanate (AUGMENTIN) 875-125 MG per tablet 1 tablet        1 tablet Oral Every 12 hours 07/07/22 0913 08/10/22 2359   07/05/22 1200  Ampicillin-Sulbactam (UNASYN) 3 g in sodium chloride 0.9 % 100 mL IVPB  Status:  Discontinued        3 g 200 mL/hr over 30 Minutes Intravenous Every 6 hours 07/05/22 0909 07/07/22 0913   07/04/22 1400  piperacillin-tazobactam (ZOSYN) IVPB 3.375 g  Status:  Discontinued        3.375 g 12.5 mL/hr over 240 Minutes Intravenous Every 8 hours 07/04/22 0957 07/05/22 0909   07/03/22 1600  DAPTOmycin (CUBICIN) 700 mg in sodium chloride 0.9 % IVPB  Status:  Discontinued        8 mg/kg  89.2 kg (Adjusted) 128 mL/hr over 30 Minutes Intravenous Daily 07/03/22 1512 07/06/22 1004   07/03/22 0000  vancomycin (VANCOREADY) IVPB 1250 mg/250 mL  Status:  Discontinued        1,250 mg 166.7 mL/hr  over 90 Minutes Intravenous Every 12 hours 07/02/22 1119 07/03/22 1512   07/02/22 1215  vancomycin (VANCOREADY) IVPB 2000 mg/400 mL        2,000 mg 200 mL/hr over 120 Minutes Intravenous  Once 07/02/22 1119 07/03/22 0144   06/29/22 1200  ceFEPIme (MAXIPIME) 2 g in sodium chloride 0.9 % 100 mL IVPB  Status:  Discontinued        2 g 200 mL/hr over 30 Minutes Intravenous Every 8 hours 06/29/22 1005 07/04/22 0957   06/29/22 1100  metroNIDAZOLE (FLAGYL) tablet 500 mg  Status:  Discontinued        500 mg Oral Every 12 hours 06/29/22 1005 07/04/22 0957   06/28/22 1200  Ampicillin-Sulbactam (UNASYN) 3 g in sodium chloride 0.9 % 100 mL IVPB  Status:  Discontinued        3 g 200 mL/hr over 30 Minutes Intravenous Every 6 hours 06/28/22 0911 06/29/22 1005   06/24/22 0600  ceFEPIme (MAXIPIME) 2 g in sodium chloride 0.9 % 100 mL IVPB  Status:   Discontinued        2 g 200 mL/hr over 30 Minutes Intravenous Every 8 hours 06/23/22 2041 06/28/22 0911   06/24/22 0300  vancomycin (VANCOREADY) IVPB 1250 mg/250 mL  Status:  Discontinued        1,250 mg 166.7 mL/hr over 90 Minutes Intravenous Every 12 hours 06/23/22 1543 06/29/22 0819   06/23/22 2100  ceFEPIme (MAXIPIME) 2 g in sodium chloride 0.9 % 100 mL IVPB        2 g 200 mL/hr over 30 Minutes Intravenous  Once 06/23/22 2006 06/23/22 2153   06/23/22 2100  metroNIDAZOLE (FLAGYL) IVPB 500 mg  Status:  Discontinued        500 mg 100 mL/hr over 60 Minutes Intravenous Every 12 hours 06/23/22 2006 06/28/22 0849   06/23/22 1600  vancomycin (VANCOREADY) IVPB 1500 mg/300 mL        1,500 mg 150 mL/hr over 120 Minutes Intravenous  Once 06/23/22 1514 06/23/22 1912   06/23/22 1430  vancomycin (VANCOCIN) IVPB 1000 mg/200 mL premix        1,000 mg 200 mL/hr over 60 Minutes Intravenous  Once 06/23/22 1418 06/23/22 1624   06/23/22 1430  cefTRIAXone (ROCEPHIN) 2 g in sodium chloride 0.9 % 100 mL IVPB        2 g 200 mL/hr over 30 Minutes Intravenous  Once 06/23/22 1418 06/23/22 1506        Assessment/Plan POD 11/9 s/p I&D of 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 Carolynne Edouard and Dr. Janee Morn on 07/01/22 and 07/03/22 respectively  - Wound debridement left chest wall necrotizing soft tissue infection now measures 30cm x 9cm x 4cm deep, incision and drainage left flank x 2  - Cont abx. Cx's from 12/5 (IR) with Prevotella species, beta lactamase positive, moderate actinomyces. Cx from 12/8 with prevotella species, beta lactamase +. Abx selection per ID.  - no need for further surgical debridement - VAC placed at bedside today - if able to maintain seal and patient tolerates, would recommend DC with home VAC and HH RN with follow up in wound care center  - if patient not able to discharge with VAC will need at least daily but more ideally BID dressing changes with WTD dressings   FEN: CM  diet VTE: SCDs, LMWH ID: Augmentin per ID. Afebrile. WBC wnl.    - below per TRH -  T2DM, uncontrolled - A1c 11.6, SSI Alcohol abuse  Tobacco abuse Obesity class II Hyponatremia - s/p lasix   LOS: 19 days    Juliet Rude , [facility] Surgery 07/12/2022, 11:13 AM Please see Amion for pager number during day hours 7:00am-4:30pm

## 2022-07-12 NOTE — Progress Notes (Signed)
[facility]                                                                              Detron Carras, is a 36 y.o. male, DOB - Jul 20, 1986, GUR:[id number] Admit date - 06/23/2022    Outpatient Primary MD for the patient is Patient, [name]  LOS - 19  days  Chief Complaint  Patient presents with   Abscess       Brief summary    Patient is a 36 year old male with history of obesity, EtOH use comes into the hospital with left chest wall cellulitis and sepsis. He reports initially there was a boil in the area that popped open, and following that the whole area started to get worse.  12/5: Underwent aspiration 12/8, 12/10: Underwent debridement by surgery   Assessment & Plan    Principal Problem: Sepsis due to chest wall cellulitis/abscess, necrotizing soft tissue infection -Ultrasound chest showed possible abscess, IR initially consulted, status post aspiration on 5/5, he was seen by general surgery and underwent I&D in OR on 5/8, repeated debridement on 12/10 also seen by ID.  Initially was treated for cultures growing prevotella, beta-lactamase species, actinomyces, with daptomycin and Unasyn, now narrowed to Augmentin.   -will need prolonged therapy for at least 6 months given the actinomyces, plan to start amoxicillin on 08/11/2022. -Has PICC line, will be removed prior to discharge. -Per surgery, no plans for repeat I&D, continue dressing changes, wound care following -Continue Augmentin, pain better controlled today, likely discharge in the next 1 to 2 days.   Hyponatremia -Likely due to fluid overload, resolved with Lasix   Mild transaminitis, alk phos -In the setting of acute illness, improving -AST ALT normal  Microcytic anemia -H&H stable   Alcohol and tobacco use -Recommended cessation Continue nicotine patch  Diabetes mellitus, poorly controlled with hyperglycemia, new diagnosis -Hemoglobin A1c 11.6 on 06/24/2022, -Likely will need insulin for  short-term until A1c <8 for better healing of the wound.  Patient is agreeable for home insulin, CBGs better, continue NovoLog 70/30 15 units twice daily, SSI.  Needs PCP at discharge.  CBG (last 3)  Recent Labs    07/11/22 1835 07/11/22 2136 07/12/22 0609  GLUCAP 184* 203* 171*      Obesity Estimated body mass index is 38.04 kg/m as calculated from the following:   Height as of this encounter: 5' 9.02" (1.753 m).   Weight as of this encounter: 116.9 kg.  Code Status: Full CODE STATUS DVT Prophylaxis:    Level of Care: Level of care: Med-Surg Family Communication: Updated patient   Disposition Plan:      Remains inpatient appropriate: Not medically ready for discharge   Procedures:  I&D x 2  Consultants:   IR Infectious disease Surgery  Medications    Subjective:   Patient in bed, appears comfortable, denies any headache, no fever, no chest pain or pressure, no shortness of breath , no abdominal pain. No new focal weakness.   Objective:   Vitals:   07/11/22 1203 07/11/22 2005 07/11/22 2320 07/12/22 0329  BP: 124/85 106/78 114/79 130/65  Pulse: (!) 106 100 91  83  Resp:  20    Temp: 97.8 F (36.6 C) 98.5 F (36.9 C)  98.5 F (36.9 C)  TempSrc: Oral Oral  Oral  SpO2: 94% 100%    Weight:      Height:       No intake or output data in the 24 hours ending 07/12/22 0920     Wt Readings from Last 3 Encounters:  07/03/22 116.9 kg  09/16/16 124.7 kg  08/06/16 120.2 kg   Physical Exam  Awake Alert, No new F.N deficits, Normal affect [place].AT,PERRAL Supple Neck, No JVD,   Symmetrical Chest wall movement, Good air movement bilaterally, CTAB RRR,No Gallops, Rubs or new Murmurs,  +ve B.Sounds, Abd Soft, No tenderness,   Left chest wall wound under bandage, minimal to no surrounding cellulitis   Data Reviewed:  I have personally reviewed following labs   Recent Labs  Lab 07/06/22 0550 07/07/22 0714 07/08/22 0903 07/11/22 0555 07/12/22 0605  WBC  12.2* 9.7 6.7 8.0 5.6  HGB 8.5* 8.5* 8.7* 9.6* 9.0*  HCT 24.5* 24.1* 25.1* 28.9* 26.7*  PLT 719* 710* 750* 756* 692*  MCV 78.3* 78.8* 80.7 81.2 81.2  MCH 27.2 27.8 28.0 27.0 27.4  MCHC 34.7 35.3 34.7 33.2 33.7  RDW 13.8 13.9 14.2 14.3 14.1  LYMPHSABS  --   --   --   --  2.2  MONOABS  --   --   --   --  0.5  EOSABS  --   --   --   --  0.1  BASOSABS  --   --   --   --  0.1    Recent Labs  Lab 07/05/22 1457 07/06/22 0550 07/08/22 0903 07/10/22 0506 07/11/22 0555 07/12/22 0605  NA 135 135 137 137 136  --   K 3.6 3.8 4.4 4.4 4.4  --   CL 96* 99 98 101 100  --   CO2 _0 --   ANIONGAP _1 --   GLUCOSE 199* 161* 132* 159* 146*  --   BUN 9 6 5* 11 12  --   CREATININE 0.68 0.67 0.59* 0.68 0.74  --   AST 30 32  --   --   --   --   ALT 32 32  --   --   --   --   ALKPHOS 155* 130*  --   --   --   --   BILITOT 0.4 0.3  --   --   --   --   ALBUMIN 1.6* 1.6*  --   --   --   --   CRP  --   --   --   --  2.1* 2.1*  PROCALCITON  --   --   --   --  <0.10 <0.10  BNP  --   --   --   --  16.4 8.6  MG  --  2.0  --   --  1.8 1.8  CALCIUM 8.5* 8.3* 8.9 9.4 9.4  --    Radiology Studies: I have personally reviewed the imaging studies  No results found.  Signature  -    Lala Lund M.D on 07/12/2022 at 9:20 AM   -  To page go to [email or web address]

## 2022-07-12 NOTE — TOC Progression Note (Addendum)
Transition of Care [facility]) - Progression Note    Patient Details  Name: [name] MRN: [id number] Date of Birth: November 12, 1985  Transition of [facility] CM/SW Contact  Leone Haven, RN Phone Number: 07/12/2022, 10:03 AM  Clinical Narrative:    NCM made referral to Amy with [facility], she states she is able to take referral. Soc will begin 24 to 48 hrs post dc. Waiting to hear if will put a wound vac on patient, NCM notified French Ana with [employer] regarding may need wound vac.    12/19- per Trixie Deis PA , the measurements are 28cm x 9.5cm x 6cm.  This NCM notified French Ana with [employer], she will send the escript to [place] to sign on her email for the wournd vac.   Expected Discharge Plan: Home w Home Health Services Barriers to Discharge: Continued Medical Work up  Expected Discharge Plan and Services Expected Discharge Plan: Home w Home Health Services In-house Referral: NA Discharge Planning Services: CM Consult Post Acute Care Choice: Home Health Living arrangements for the past 2 months: Single Family Home                 DME Arranged: Negative pressure wound device DME Agency: [facility] Date DME Agency Contacted: 07/11/22 Time DME Agency Contacted: 1002 Representative spoke with at DME Agency: Kennyth Arnold HH Arranged: RN, PT HH Agency: [facility] Date [facility] Agency Contacted: 07/12/22 Time HH Agency Contacted: 1003 Representative spoke with at [facility] Agency: Amy   Social Determinants of Health (SDOH) Interventions    Readmission Risk Interventions     No data to display

## 2022-07-12 NOTE — Progress Notes (Addendum)
Physical Therapy Wound Treatment and Discharge Patient Details  Name: [name] MRN: [id number] Date of Birth: 06-28-1986  Today's Date: 07/12/2022 Time: 1116-1228 Time Calculation (min): 72 min  Subjective  Subjective Assessment Patient and Family Stated Goals: get back to his son Date of Onset:  (November 2023) Prior Treatments: surgical debridement  Pain Score:  Pt give IV and oral pain medications which were sufficient for pain management during hydrotherapy. Pt with increased pain with placement of wound vac. RN notified and administered additional pain medication.  Wound Assessment  Negative Pressure Wound Therapy Chest Left;Anterior (Active)  Last dressing change 07/12/22 07/12/22 1200  Site / Wound Assessment Red 07/12/22 1200  Wound filler - Black foam 2 07/12/22 1200  Cycle Continuous 07/12/22 1200  Target Pressure (mmHg) 125 07/12/22 1200  Canister Changed Yes 07/12/22 1200  Machine plugged into wall outlet (NOT bed outlet) Yes 07/12/22 1200  Dressing Status Intact 07/12/22 1200     Wound / Incision (Open or Dehisced) 06/28/22 Incision - Open;Incision - Dehisced Left;Upper (Active)  Wound Image   07/07/22 1600  Dressing Type Barrier Film (skin prep);Moist to moist;Honey 07/12/22 1600  Dressing Changed Changed 07/12/22 1600  Dressing Status New drainage 07/12/22 1600  Dressing Change Frequency Twice a day 07/12/22 1600  Site / Wound Assessment Granulation tissue;Yellow;Red;Pink;Black;Painful 07/12/22 1600  % Wound base Red or Granulating 85% 07/12/22 1600  % Wound base Yellow/Fibrinous Exudate 15% 07/12/22 1600  Peri-wound Assessment Induration;Erythema (blanchable) 07/12/22 1600  Wound Length (cm) 9 cm 07/07/22 1600  Wound Width (cm) 30 cm 07/07/22 1600  Wound Depth (cm) 4 cm 07/07/22 1600  Wound Volume (cm^3) 1080 cm^3 07/07/22 1600  Wound Surface Area (cm^2) 270 cm^2 07/07/22 1600  Margins Unattached edges (unapproximated) 07/12/22 1600  Closure None 07/12/22  1600  Drainage Amount Minimal 07/12/22 1600  Drainage Description Serosanguineous 07/12/22 1600  Non-staged Wound Description Not applicable 79/43/27 6147  Treatment Cleansed;Debridement (Selective);Hydrotherapy (Pulse lavage) 07/12/22 1600      Selective Debridement (non-excisional) Selective Debridement (non-excisional) - Location: posterior to central part of the incision Selective Debridement (non-excisional) - Tools Used: Forceps, Scalpel, Scissors Selective Debridement (non-excisional) - Tissue Removed: yellowish gray, fibrin slough    Wound Assessment and Plan  Wound Therapy - Assess/Plan/Recommendations Wound Therapy - Clinical Statement: Pt given IV Morphine and oral Hydrocodone prior to treatment. Surgery service in room when dressing taken down, agree to pulse lavage and selective debridement. Corder RN placed wound vac for trial at end of session. Pt discharged from Hydrotherapy treatment Wound Therapy - Functional Problem List: Newly diagnosed diabetes Factors Delaying/Impairing Wound Healing: Diabetes Mellitus Hydrotherapy Plan: Debridement, Dressing change, Patient/family education, Pulsatile lavage with suction Wound Therapy - Frequency:  (2x/wk) Wound Therapy - Follow Up Recommendations: dressing changes by RN  Wound Therapy Goals- Improve the function of patient's integumentary system by progressing the wound(s) through the phases of wound healing (inflammation - proliferation - remodeling) by: Wound Therapy Goals - Improve the function of patient's integumentary system by progressing the wound(s) through the phases of wound healing by: Decrease Necrotic Tissue to: 10 Decrease Necrotic Tissue - Progress: Progressing toward goal Improve Drainage Characteristics: Min Improve Drainage Characteristics - Progress: Progressing toward goal Goals/treatment plan/discharge plan were made with and agreed upon by patient/family: Yes Time For Goal Achievement: 7 days Wound Therapy -  Potential for Goals: Excellent  Goals will be updated until maximal potential achieved or discharge criteria met.  Discharge criteria: when goals achieved, discharge from hospital, MD decision/surgical intervention,  no progress towards goals, refusal/missing three consecutive treatments without notification or medical reason.  GP     Charges PT Wound Care Charges $Wound Debridement up to 20 cm: < or equal to 20 cm $PT Hydrotherapy Dressing: 3 dressings $PT Hydrotherapy Visit: 1 Visit     Stephene Alegria B. Migdalia Dk PT, DPT Acute Rehabilitation Services Please use secure chat or  Call Office [phone]   [place] 07/12/2022, 4:45 PM

## 2022-07-12 NOTE — Consult Note (Signed)
WOC Nurse Consult Note: Surgery following for assessment and plan of care. WOC team requested to apply NPWT. Patient premedicated because he was receiving hydrotherapy before, tolerated with a moderate amount of discomfort.  Wound type: L anterior chest full thickness postop wound, 85% beefy red, 15% yellow interspersed throughout,  7x31x5 cm  Applied 2 pieces of black foam to 125 cm continuous suction WOC team will plan to change again Friday.  Please re-consult if further assistance is needed.  Thank-you,  Cammie Mcgee MSN, RN, CWOCN, [place], CNS [phone]

## 2022-07-12 NOTE — TOC Progression Note (Signed)
Transition of Care [facility]) - Progression Note    Patient Details  Name: [name] MRN: [id number] Date of Birth: 1986-02-18  Transition of Care [facility] CM/SW Contact  Leone Haven, RN Phone Number: 07/12/2022, 4:25 PM  Clinical Narrative:    NCM received call from NCM [facility] with [facility], she wanted to know the dc plan and date.  Her phone is [phone], fax is [phone].  NCM informed her of the tentative dc plan.   Expected Discharge Plan: Home w Home Health Services Barriers to Discharge: Continued Medical Work up  Expected Discharge Plan and Services Expected Discharge Plan: Home w Home Health Services In-house Referral: NA Discharge Planning Services: CM Consult Post Acute Care Choice: Home Health Living arrangements for the past 2 months: Single Family Home                 DME Arranged: Negative pressure wound device DME Agency: [facility] Date DME Agency Contacted: 07/11/22 Time DME Agency Contacted: 1002 Representative spoke with at DME Agency: Kennyth Arnold HH Arranged: RN, PT HH Agency: [facility] Date [facility] Agency Contacted: 07/12/22 Time HH Agency Contacted: 1003 Representative spoke with at [facility] Agency: Amy   Social Determinants of Health (SDOH) Interventions    Readmission Risk Interventions     No data to display

## 2022-07-12 NOTE — Inpatient Diabetes Management (Signed)
Inpatient Diabetes Program Recommendations  AACE/ADA: New Consensus Statement on Inpatient Glycemic Control   Target Ranges:  Prepandial:   less than 140 mg/dL      Peak postprandial:   less than 180 mg/dL (1-2 hours)      Critically ill patients:  140 - 180 mg/dL    Latest Reference Range & Units 07/11/22 07:55 07/11/22 12:03 07/11/22 18:35 07/11/22 21:36 07/12/22 06:09  Glucose-Capillary 70 - 99 mg/dL 175 (H) 102 (H) 585 (H) 203 (H) 171 (H)   Review of Glycemic Control  Diabetes history: No; new DM dx Outpatient Diabetes medications: NA Current orders for Inpatient glycemic control: 70/30 15 units BID, Novolog 0-15 units TID with meals, Novolog 0-5 units QHS  Inpatient Diabetes Program Recommendations:    Insulin: Please consider increasing 70/30 to 17 units BID.  Outpatient DM: At discharge please provide Rx for:  1) 70/30 Flex pen insulin- (Order [phone]) 2) insulin pen needles (Order [phone]) 3) glucose meter and supplies (Order # [id number])  Thanks, Orlando Penner, RN, MSN, CDCES Diabetes Coordinator Inpatient Diabetes Program [phone] (Team Pager from 8am to 5pm)

## 2022-07-12 NOTE — Plan of Care (Signed)
  Problem: Education: Goal: Ability to describe self-care measures that may prevent or decrease complications (Diabetes Survival Skills Education) will improve Outcome: Progressing Goal: Individualized Educational Video(s) Outcome: Progressing   Problem: Coping: Goal: Ability to adjust to condition or change in health will improve Outcome: Progressing   Problem: Fluid Volume: Goal: Ability to maintain a balanced intake and output will improve Outcome: Progressing   Problem: Health Behavior/Discharge Planning: Goal: Ability to identify and utilize available resources and services will improve Outcome: Progressing Goal: Ability to manage health-related needs will improve Outcome: Progressing   Problem: Metabolic: Goal: Ability to maintain appropriate glucose levels will improve Outcome: Progressing   Problem: Nutritional: Goal: Maintenance of adequate nutrition will improve Outcome: Progressing Goal: Progress toward achieving an optimal weight will improve Outcome: Progressing   Problem: Skin Integrity: Goal: Risk for impaired skin integrity will decrease Outcome: Progressing   Problem: Tissue Perfusion: Goal: Adequacy of tissue perfusion will improve Outcome: Progressing   Problem: Clinical Measurements: Goal: Ability to avoid or minimize complications of infection will improve Outcome: Progressing   Problem: Skin Integrity: Goal: Skin integrity will improve Outcome: Progressing   Problem: Education: Goal: Knowledge of General Education information will improve Description: Including pain rating scale, medication(s)/side effects and non-pharmacologic comfort measures Outcome: Progressing   Problem: Health Behavior/Discharge Planning: Goal: Ability to manage health-related needs will improve Outcome: Progressing   Problem: Clinical Measurements: Goal: Ability to maintain clinical measurements within normal limits will improve Outcome: Progressing Goal: Will  remain free from infection Outcome: Progressing Goal: Diagnostic test results will improve Outcome: Progressing Goal: Respiratory complications will improve Outcome: Progressing Goal: Cardiovascular complication will be avoided Outcome: Progressing   Problem: Activity: Goal: Risk for activity intolerance will decrease Outcome: Progressing   Problem: Nutrition: Goal: Adequate nutrition will be maintained Outcome: Progressing   Problem: Coping: Goal: Level of anxiety will decrease Outcome: Progressing   Problem: Elimination: Goal: Will not experience complications related to bowel motility Outcome: Progressing Goal: Will not experience complications related to urinary retention Outcome: Progressing   Problem: Pain Managment: Goal: General experience of comfort will improve Outcome: Progressing   Problem: Safety: Goal: Ability to remain free from injury will improve Outcome: Progressing   Problem: Skin Integrity: Goal: Risk for impaired skin integrity will decrease Outcome: Progressing   

## 2022-07-13 ENCOUNTER — Other Ambulatory Visit (HOSPITAL_COMMUNITY): Payer: Self-pay

## 2022-07-13 DIAGNOSIS — L03313 Cellulitis of chest wall: Secondary | ICD-10-CM | POA: Diagnosis not present

## 2022-07-13 LAB — CBC WITH DIFFERENTIAL/PLATELET
Abs Immature Granulocytes: 0.03 10*3/uL (ref 0.00–0.07)
Basophils Absolute: 0.1 10*3/uL (ref 0.0–0.1)
Basophils Relative: 1 %
Eosinophils Absolute: 0.1 10*3/uL (ref 0.0–0.5)
Eosinophils Relative: 2 %
HCT: 28.1 % — ABNORMAL LOW (ref 39.0–52.0)
Hemoglobin: 9.4 g/dL — ABNORMAL LOW (ref 13.0–17.0)
Immature Granulocytes: 1 %
Lymphocytes Relative: 41 %
Lymphs Abs: 2.6 10*3/uL (ref 0.7–4.0)
MCH: 27.2 pg (ref 26.0–34.0)
MCHC: 33.5 g/dL (ref 30.0–36.0)
MCV: 81.2 fL (ref 80.0–100.0)
Monocytes Absolute: 0.5 10*3/uL (ref 0.1–1.0)
Monocytes Relative: 8 %
Neutro Abs: 3 10*3/uL (ref 1.7–7.7)
Neutrophils Relative %: 47 %
Platelets: 705 10*3/uL — ABNORMAL HIGH (ref 150–400)
RBC: 3.46 MIL/uL — ABNORMAL LOW (ref 4.22–5.81)
RDW: 14.2 % (ref 11.5–15.5)
WBC: 6.2 10*3/uL (ref 4.0–10.5)
nRBC: 0 % (ref 0.0–0.2)

## 2022-07-13 LAB — PROCALCITONIN: Procalcitonin: <0.1 ng/mL

## 2022-07-13 LAB — GLUCOSE, CAPILLARY
Glucose-Capillary: 140 mg/dL — ABNORMAL HIGH (ref 70–99)
Glucose-Capillary: 128 mg/dL — ABNORMAL HIGH (ref 70–99)

## 2022-07-13 LAB — BRAIN NATRIURETIC PEPTIDE: B Natriuretic Peptide: 8.5 pg/mL (ref 0.0–100.0)

## 2022-07-13 LAB — MAGNESIUM: Magnesium: 1.9 mg/dL (ref 1.7–2.4)

## 2022-07-13 LAB — C-REACTIVE PROTEIN: CRP: 2.6 mg/dL — ABNORMAL HIGH (ref <1.0)

## 2022-07-13 MED ORDER — INSULIN ASPART PROT & ASPART (70-30 MIX) 100 UNIT/ML PEN
15.0000 [IU] | PEN_INJECTOR | Freq: Two times a day (BID) | SUBCUTANEOUS | 0 refills | Status: DC
  Filled 2022-07-13: qty 9, 30d supply, fill #0

## 2022-07-13 MED ORDER — OXYCODONE HCL 10 MG PO TABS: 5.0000 mg | ORAL_TABLET | Freq: Four times a day (QID) | ORAL | 0 refills | Status: DC | PRN

## 2022-07-13 MED ORDER — GLUCOSE BLOOD VI STRP
ORAL_STRIP | 0 refills | Status: DC
  Filled 2022-07-13: qty 100, 25d supply, fill #0

## 2022-07-13 MED ORDER — INSULIN ASPART 100 UNIT/ML FLEXPEN
PEN_INJECTOR | SUBCUTANEOUS | 0 refills | Status: DC
  Filled 2022-07-13: qty 9, 30d supply, fill #0

## 2022-07-13 MED ORDER — ACETAMINOPHEN 325 MG PO TABS
650.0000 mg | ORAL_TABLET | Freq: Three times a day (TID) | ORAL | 0 refills | Status: DC | PRN
  Filled 2022-07-13: qty 20, 4d supply, fill #0

## 2022-07-13 MED ORDER — INSULIN PEN NEEDLE 32G X 4 MM MISC
0 refills | Status: DC
  Filled 2022-07-13 (×2): qty 100, 25d supply, fill #0

## 2022-07-13 MED ORDER — RISA-BID PROBIOTIC PO TABS
1.0000 | ORAL_TABLET | Freq: Two times a day (BID) | ORAL | 2 refills | Status: AC
  Filled 2022-07-13: qty 60, 30d supply, fill #0

## 2022-07-13 MED ORDER — ACCU-CHEK FASTCLIX LANCETS MISC
0 refills | Status: DC
  Filled 2022-07-13: qty 102, 25d supply, fill #0

## 2022-07-13 MED ORDER — AMOXICILLIN-POT CLAVULANATE 875-125 MG PO TABS
1.0000 | ORAL_TABLET | Freq: Two times a day (BID) | ORAL | 0 refills | Status: DC
  Filled 2022-07-13: qty 60, 30d supply, fill #0

## 2022-07-13 MED ORDER — AMOXICILLIN 500 MG PO CAPS
500.0000 mg | ORAL_CAPSULE | Freq: Three times a day (TID) | ORAL | 0 refills | Status: DC
  Filled 2022-07-13: qty 90, 30d supply, fill #0
  Filled 2022-07-13: qty 30, 10d supply, fill #0

## 2022-07-13 MED ORDER — BLOOD GLUCOSE MONITOR KIT
PACK | 0 refills | Status: AC
  Filled 2022-07-13: qty 1, 30d supply, fill #0

## 2022-07-13 NOTE — Discharge Summary (Signed)
[name] SJG:[id number] DOB: 03-Nov-1985 DOA: 06/23/2022  PCP: Patient, [name]  Admit date: 06/23/2022  Discharge date: 07/13/2022  Admitted From: Home   Disposition:  Home   Recommendations for Outpatient Follow-up:   Follow up with PCP in 1-2 weeks  PCP Please obtain BMP/CBC, 2 view CXR in 1week,  (see Discharge instructions)   PCP Please follow up on the following pending results:    Home Health: RN   Equipment/Devices: W.Vac  Consultations: CCS, ID Discharge Condition: Stable    CODE STATUS: Full    Diet Recommendation: Heart Healthy Low Carb    Chief Complaint  Patient presents with   Abscess     Brief history of present illness from the day of admission and additional interim summary    36 year old male with history of obesity, EtOH use comes into the hospital with left chest wall cellulitis and sepsis. He reports initially there was a boil in the area that popped open, and following that the whole area started to get worse.  12/5: Underwent aspiration 12/8, 12/10: Underwent debridement by surgery                                                                 Hospital Course   Sepsis due to chest wall cellulitis/abscess, necrotizing soft tissue infection -Ultrasound chest showed possible abscess, IR initially consulted, status post aspiration on 5/5, he was seen by general surgery and underwent I&D in OR on 5/8, repeated debridement on 12/10 also seen by ID.  Initially was treated for cultures growing prevotella, beta-lactamase species, actinomyces, with daptomycin and Unasyn, now narrowed to Augmentin.   -will need prolonged therapy for at least 6 months given the actinomyces, he will be discharged on Augmentin and then it will be switched to amoxicillin on 08/11/2022, per ID. Case  management, general surgery team in detail, he will be discharged with home wound VAC and home nursing for dressing changes.  Antibiotics as in med rec below.  Will also follow-up with CCS and ID within a few weeks of discharge.    Hyponatremia -Likely due to fluid overload, resolved with Lasix   Mild transaminitis, alk phos -Due to acute infection much improved, PCP to recheck in 7 to 10 days   Microcytic anemia -H&H stable, PCP to monitor   Alcohol and tobacco use -Strictly counseled to quit both   Diabetes mellitus, poorly controlled with hyperglycemia, new diagnosis -Hemoglobin A1c 11.6 on 06/24/2022, -placed on insulin upon discharge, testing supplies and insulin supplies provided for a month, received insulin and diabetic teaching.   Discharge diagnosis     Principal Problem:   Cellulitis of chest wall Active Problems:   Hyperglycemia   Hyponatremia   Tobacco abuse   Alcohol abuse   Breast abscess   Hidradenitis  suppurativa   Dental infection   Morbid obesity ([place])   Type 2 diabetes mellitus with hyperglycemia ([place])   Pyomyositis   Anxiety   Hidradenitis   Hypervolemia    Discharge instructions    Discharge Instructions     Discharge instructions   Complete by: As directed    Follow with Primary MD in 7 days   Get CBC, CMP, 2 view Chest X ray -  checked next visit with your primary MD    Activity: As tolerated with Full fall precautions use walker/cane & assistance as needed  Disposition Home    Diet: Heart Healthy Low Carb  Accuchecks 4 times/day, Once in AM empty stomach and then before each meal. Log in all results and show them to your Prim.MD in 3 days. If any glucose reading is under 80 or above 300 call your Prim MD immidiately. Follow Low glucose instructions for glucose under 80 as instructed.   Special Instructions: If you have smoked or chewed Tobacco  in the last 2 yrs please stop smoking, stop any regular Alcohol  and or any Recreational  drug use.  On your next visit with your primary care physician please Get Medicines reviewed and adjusted.  Please request your Prim.MD to go over all Hospital Tests and Procedure/Radiological results at the follow up, please get all Hospital records sent to your Prim MD by signing hospital release before you go home.  If you experience worsening of your admission symptoms, develop shortness of breath, life threatening emergency, suicidal or homicidal thoughts you must seek medical attention immediately by calling 911 or calling your MD immediately  if symptoms less severe.  You Must read complete instructions/literature along with all the possible adverse reactions/side effects for all the Medicines you take and that have been prescribed to you. Take any new Medicines after you have completely understood and accpet all the possible adverse reactions/side effects.   Discharge wound care:   Complete by: As directed    Wound Care instructions as provided by general surgery team.   Increase activity slowly   Complete by: As directed        Discharge Medications   Allergies as of 07/13/2022       Reactions   Peach [prunus Persica]    Pt reports swelling and lip tingling as reaction        Medication List     STOP taking these medications    naproxen 500 MG tablet Commonly known as: NAPROSYN       TAKE these medications    acetaminophen 325 MG tablet Commonly known as: TYLENOL Take 2 tablets (650 mg total) by mouth every 8 (eight) hours as needed for moderate pain.   amoxicillin 500 MG capsule Commonly known as: AMOXIL Take 1 capsule (500 mg total) by mouth 3 (three) times daily for 120 doses. Start taking on: August 11, 2022   amoxicillin-clavulanate 875-125 MG tablet Commonly known as: AUGMENTIN Take 1 tablet by mouth 2 (two) times daily. Take until ID clinic visit on 08/10/22   blood glucose meter kit and supplies Kit DM testing supplies/kit ( glucometer,  lancets, strips)  for QAC - HS accuchecks. Can give 1 month testing supplies for QA CHS Accu-Cheks of any brand.   ibuprofen 200 MG tablet Commonly known as: ADVIL Take 200 mg by mouth every 6 (six) hours as needed for mild pain.   insulin aspart 100 UNIT/ML FlexPen Commonly known as: NOVOLOG Before each meal 3 times a day,  140-199 - 2 units, 200-250 - 4 units, 251-299 - 6 units,  300-349 - 8 units,  350 or above 10 units.   insulin aspart protamine- aspart (70-30) 100 UNIT/ML injection Commonly known as: NOVOLOG MIX 70/30 Inject 0.15 mLs (15 Units total) into the skin 2 (two) times daily with a meal.   Insulin Syringe-Needle U-100 30G X 1/2" 1 ML Misc Use as directed for 4 times a day insulin SQ   Oxycodone HCl 10 MG Tabs Take 0.5-1 tablets (5-10 mg total) by mouth every 6 (six) hours as needed for severe pain or moderate pain (51m for moderate pain, 153mfor severe pain).   saccharomyces boulardii 250 MG capsule Commonly known as: FLORASTOR Take 1 capsule (250 mg total) by mouth 2 (two) times daily.               Durable Medical Equipment  (From admission, onward)           Start     Ordered   07/12/22 1328  For home use only DME Negative pressure wound device  Once       Question Answer Comment  Frequency of dressing change 3 times per week   Length of need 6 Months   Dressing type Foam   Amount of suction 125 mm/Hg   Pressure application Continuous pressure   Supplies 10 canisters and 15 dressings per month for duration of therapy      07/12/22 1330              Discharge Care Instructions  (From admission, onward)           Start     Ordered   07/13/22 0000  Discharge wound care:       Comments: Wound Care instructions as provided by general surgery team.   07/13/22 0922             Follow-up Information     [facility]            Follow up on 08/08/2022.   Why: 9 am, will be seeing Dr. JeKalman Shanontact information: [id number] on 07/22/2022.   Specialty: Internal Medicine Why: hospital follow up and establish as new patient Contact information: [id number], [place].   Specialty: General Surgery Why: As needed Contact information: [phone]      VaTommy MedalCoLavell IslamMD. Schedule an appointment as soon as possible for a visit in 2 week(s).   Specialty: Infectious Diseases Why: Necrotizing cellulitis infection follow-up, please follow-up within 2 weeks for further antibiotic refills. Contact information: [address] [phone]               Major procedures and Radiology Reports - PLEASE review detailed and final reports thoroughly  -       [place] SITE RITE  Result Date: 07/04/2022 If Site Rite image not attached, placement could not be confirmed due to current cardiac rhythm.  [place]domen Limited  Result Date: 07/02/2022 CLINICAL DATA:  Flank mass EXAM: ULTRASOUND ABDOMEN LIMITED COMPARISON:  LEFT axillary ultrasound, 06/26/2022 FINDINGS: Focused ultrasound at the  area of palpable concern, along the LEFT breast/axilla. Indurated subcutaneous tissues . No adenopathy or abnormal fluid collection within the imaged area. IMPRESSION: Subcutaneous edema. No adenopathy or abnormal fluid collection within the imaged area of the LEFT breast/axilla. Electronically Signed   By: Michaelle Birks M.D.   On: 07/02/2022 15:50   [place] FINE NEEDLE ASP 1ST LESION  Result Date: 06/28/2022 INDICATION: 36 year old male with left breast abscess and possible hidradenitis suppurative. EXAM: ULTRASOUND-GUIDED ASPIRATION OF LEFT BREAST ABSCESS  MEDICATIONS: Moderate sedation ANESTHESIA/SEDATION: Moderate (conscious) sedation was employed during this procedure. A total of Versed 1.63m and fentanyl 50 mcg was administered intravenously at the order of the provider performing the procedure. Total intra-service moderate sedation time: 19 minutes. Patient's level of consciousness and vital signs were monitored continuously by radiology nurse throughout the procedure under the supervision of the provider performing the procedure. COMPLICATIONS: None immediate. PROCEDURE: Informed written consent was obtained from the patient after a thorough discussion of the procedural risks, benefits and alternatives. All questions were addressed. A timeout was performed prior to the initiation of the procedure. Left breast tissue was evaluated with ultrasound. Multiple areas throughout the left breast are erythematous with induration. Complex fluid collection near the 5 o'clock position was targeted for aspiration. This area was prepped with Betadine and sterile field was created. Skin was anesthetized using 1% lidocaine. Using ultrasound guidance, a Yueh catheter was directed into the complex collections. The Yueh catheter was repositioned multiple times. Only 4 mL cloudy brown fluid could be aspirated. Bandage placed over the puncture site. FINDINGS: Soft tissues throughout the left breast are diffusely edematous. Complex fluid collection in the 5 o'clock position was targeted. Despite having a Yueh catheter well positioned in the complex fluid collection, only 4 mL of cloudy brown fluid could be removed. Collection could not be decompressed. IMPRESSION: Ultrasound guided aspiration of complex fluid in the left breast. Fluid was sent for culture. Electronically Signed   By: AMarkus DaftM.D.   On: 06/28/2022 14:03   [place]AXILLA LEFT  Result Date: 06/27/2022 CLINICAL DATA:  Swelling left axilla in 2 lower outer left breast/chest wall towards the inframammary fold. Possible  infection. EXAM: ULTRASOUND OF THE LEFT BREAST COMPARISON:  None available. FINDINGS: Examination was performed by the ultrasound technologist without breast imaging radiologist present. No corresponding mammographic evaluation for correlation. Targeted ultrasound is performed, showing an irregular shaped complicated fluid collection over the 5-6 o'clock position of the left breast towards the inframammary fold measuring approximately 2.1 x 2.6 x 6.1 cm. Mobile fluid is visualized within this collection on the sonographic images. Correlating with patient's presenting history, this likely represents an abscess. There is associated subcutaneous edema. Possible mild associated skin thickening. IMPRESSION: 6.1 cm complicated fluid collection over the 5-6 o'clock position of the left breast towards the inframammary fold likely representing an abscess. RECOMMENDATION: Recommend completion of patient's diagnostic workup with left breast mammogram when feasible. Also recommend ultrasound-guided aspiration of this presumed abscess and completion of antibiotic course. If abscess confirmed on aspiration, then would recommend follow-up ultrasound 1-2 weeks. If purulent material unable to be aspirated, would recommend converting procedure to ultrasound-guided core needle biopsy. I have discussed the findings and recommendations with the patient. If applicable, a reminder letter will be sent to the patient regarding the next appointment. BI-RADS CATEGORY  3: Probably benign. These results will be called to the ordering clinician or representative by the Radiologist Assistant, and communication documented in the PACS or [employer] Electronically Signed  By: Marin Olp M.D.   On: 06/27/2022 11:39   CT Chest W Contrast  Result Date: 06/23/2022 CLINICAL DATA:  Chest wall pain, nontraumatic, infection or inflammation suspected, xray done EXAM: CT CHEST WITH CONTRAST TECHNIQUE: Multidetector CT imaging of the chest was  performed during intravenous contrast administration. RADIATION DOSE REDUCTION: This exam was performed according to the departmental dose-optimization program which includes automated exposure control, adjustment of the mA and/or kV according to patient size and/or use of iterative reconstruction technique. CONTRAST:  62m OMNIPAQUE IOHEXOL 350 MG/ML SOLN COMPARISON:  None Available. FINDINGS: Cardiovascular: Normal heart size. No significant pericardial effusion. The thoracic aorta is normal in caliber. No atherosclerotic plaque of the thoracic aorta. No coronary artery calcifications. Mediastinum/Nodes: Borderline enlarged left axillary lymph nodes. No enlarged mediastinal, hilar, or right axillary lymph nodes. Thyroid gland, trachea, and esophagus demonstrate no significant findings. Lungs/Pleura: No focal consolidation. No pulmonary nodule. No pulmonary mass. No pleural effusion. No pneumothorax. Upper Abdomen: No acute abnormality. Musculoskeletal: Collimated off view left breast with associated dermal thickening and subcutaneus soft tissue edema. No suspicious lytic or blastic osseous lesions. No acute displaced fracture. Bulky osteophyte formation of the T11-T12 levels. IMPRESSION: 1. Collimated off view left breast with associated dermal thickening and subcutaneus soft tissue edema. Finding could represent infection versus malignancy. 2. Borderline enlarged left axillary lymph nodes. Consider diagnostic mammography/ultrasound for further evaluation. 3. No acute intrathoracic abnormality. Electronically Signed   By: MIven FinnM.D.   On: 06/23/2022 17:47      Today   Subjective    [name] has no headache,no chest abdominal pain,no new weakness tingling or numbness, feels much better wants to go home today.     Objective   Blood pressure 128/80, pulse (!) 106, temperature 99 F (37.2 C), temperature source Oral, resp. rate 18, height 5' 9.02" (1.753 m), weight 116.9 kg, SpO2 94  %.   Intake/Output Summary (Last 24 hours) at 07/13/2022 0941 Last data filed at 07/13/2022 0800 Gross per 24 hour  Intake 120 ml  Output 100 ml  Net 20 ml    Exam  Awake Alert, No new F.N deficits,    [place].AT,PERRAL Supple Neck,   Symmetrical Chest wall movement, Good air movement bilaterally, L. chest wall wound under wound VAC dressing RRR,No Gallops,   +ve B.Sounds, Abd Soft, Non tender,  No Cyanosis, Clubbing or edema    Data Review   Recent Labs  Lab 07/07/22 0714 07/08/22 0903 07/11/22 0555 07/12/22 0605 07/13/22 0415  WBC 9.7 6.7 8.0 5.6 6.2  HGB 8.5* 8.7* 9.6* 9.0* 9.4*  HCT 24.1* 25.1* 28.9* 26.7* 28.1*  PLT 710* 750* 756* 692* 705*  MCV 78.8* 80.7 81.2 81.2 81.2  MCH 27.8 28.0 27.0 27.4 27.2  MCHC 35.3 34.7 33.2 33.7 33.5  RDW 13.9 14.2 14.3 14.1 14.2  LYMPHSABS  --   --   --  2.2 2.6  MONOABS  --   --   --  0.5 0.5  EOSABS  --   --   --  0.1 0.1  BASOSABS  --   --   --  0.1 0.1    Recent Labs  Lab 07/08/22 0903 07/10/22 0506 07/11/22 0555 07/12/22 0605 07/13/22 0415  NA 137 137 136  --   --   K 4.4 4.4 4.4  --   --   CL 98 101 100  --   --   CO2 _0 --   --  ANIONGAP _0 --   --   GLUCOSE 132* 159* 146*  --   --   BUN 5* 11 12  --   --   CREATININE 0.59* 0.68 0.74  --   --   CRP  --   --  2.1* 2.1* 2.6*  PROCALCITON  --   --  <0.10 <0.10 <0.10  BNP  --   --  16.4 8.6 8.5  MG  --   --  1.8 1.8 1.9  CALCIUM 8.9 9.4 9.4  --   --      Total Time in preparing paper work, data evaluation and todays exam - 35 minutes  Signature  -    Lala Lund M.D on 07/13/2022 at 9:41 AM   -  To page go to [email or web address]

## 2022-07-13 NOTE — Progress Notes (Signed)
[facility] Progress Note  10 Days Post-Op  Subjective: CC-  Comfortable this morning. States that the wound vac lost seal once over night, more drape applied and it has kept a good seal since then.  Objective: Vital signs in last 24 hours: Temp:  [97.9 F (36.6 C)-99 F (37.2 C)] 99 F (37.2 C) (12/20 0700) Pulse Rate:  [97-106] 106 (12/20 0700) Resp:  [18] 18 (12/20 0100) BP: (108-131)/(68-88) 128/80 (12/20 0700) SpO2:  [94 %-100 %] 94 % (12/20 0700) Last BM Date : 07/09/22  Intake/Output from previous day: 12/19 0701 - 12/20 0700 In: -  Out: 100 [Drains:100] Intake/Output this shift: No intake/output data recorded.  PE: Gen:  Alert, NAD, pleasant Wound: Left breast/chest wall wound with wound vac in place and good seal  Lab Results:  Recent Labs    07/12/22 0605 07/13/22 0415  WBC 5.6 6.2  HGB 9.0* 9.4*  HCT 26.7* 28.1*  PLT 692* 705*   BMET Recent Labs    07/11/22 0555  NA 136  K 4.4  CL 100  CO2 29  GLUCOSE 146*  BUN 12  CREATININE 0.74  CALCIUM 9.4   PT/INR No results for input(s): "LABPROT", "INR" in the last 72 hours. CMP     Component Value Date/Time   NA 136 07/11/2022 0555   K 4.4 07/11/2022 0555   CL 100 07/11/2022 0555   CO2 29 07/11/2022 0555   GLUCOSE 146 (H) 07/11/2022 0555   BUN 12 07/11/2022 0555   CREATININE 0.74 07/11/2022 0555   CALCIUM 9.4 07/11/2022 0555   PROT 7.4 07/06/2022 0550   ALBUMIN 1.6 (L) 07/06/2022 0550   AST 32 07/06/2022 0550   ALT 32 07/06/2022 0550   ALKPHOS 130 (H) 07/06/2022 0550   BILITOT 0.3 07/06/2022 0550   GFRNONAA >60 07/11/2022 0555   Lipase  No results found for: "LIPASE"     Studies/Results: No results found.  Anti-infectives: Anti-infectives (From admission, onward)    Start     Dose/Rate Route Frequency Ordered Stop   08/11/22 0600  amoxicillin (AMOXIL) capsule 500 mg        500 mg Oral Every 8 hours 07/07/22 1135     07/07/22 1200  amoxicillin-clavulanate  (AUGMENTIN) 875-125 MG per tablet 1 tablet        1 tablet Oral Every 12 hours 07/07/22 0913 08/10/22 2359   07/05/22 1200  Ampicillin-Sulbactam (UNASYN) 3 g in sodium chloride 0.9 % 100 mL IVPB  Status:  Discontinued        3 g 200 mL/hr over 30 Minutes Intravenous Every 6 hours 07/05/22 0909 07/07/22 0913   07/04/22 1400  piperacillin-tazobactam (ZOSYN) IVPB 3.375 g  Status:  Discontinued        3.375 g 12.5 mL/hr over 240 Minutes Intravenous Every 8 hours 07/04/22 0957 07/05/22 0909   07/03/22 1600  DAPTOmycin (CUBICIN) 700 mg in sodium chloride 0.9 % IVPB  Status:  Discontinued        8 mg/kg  89.2 kg (Adjusted) 128 mL/hr over 30 Minutes Intravenous Daily 07/03/22 1512 07/06/22 1004   07/03/22 0000  vancomycin (VANCOREADY) IVPB 1250 mg/250 mL  Status:  Discontinued        1,250 mg 166.7 mL/hr over 90 Minutes Intravenous Every 12 hours 07/02/22 1119 07/03/22 1512   07/02/22 1215  vancomycin (VANCOREADY) IVPB 2000 mg/400 mL        2,000 mg 200 mL/hr over 120 Minutes Intravenous  Once 07/02/22 1119 07/03/22 0144  06/29/22 1200  ceFEPIme (MAXIPIME) 2 g in sodium chloride 0.9 % 100 mL IVPB  Status:  Discontinued        2 g 200 mL/hr over 30 Minutes Intravenous Every 8 hours 06/29/22 1005 07/04/22 0957   06/29/22 1100  metroNIDAZOLE (FLAGYL) tablet 500 mg  Status:  Discontinued        500 mg Oral Every 12 hours 06/29/22 1005 07/04/22 0957   06/28/22 1200  Ampicillin-Sulbactam (UNASYN) 3 g in sodium chloride 0.9 % 100 mL IVPB  Status:  Discontinued        3 g 200 mL/hr over 30 Minutes Intravenous Every 6 hours 06/28/22 0911 06/29/22 1005   06/24/22 0600  ceFEPIme (MAXIPIME) 2 g in sodium chloride 0.9 % 100 mL IVPB  Status:  Discontinued        2 g 200 mL/hr over 30 Minutes Intravenous Every 8 hours 06/23/22 2041 06/28/22 0911   06/24/22 0300  vancomycin (VANCOREADY) IVPB 1250 mg/250 mL  Status:  Discontinued        1,250 mg 166.7 mL/hr over 90 Minutes Intravenous Every 12 hours 06/23/22  1543 06/29/22 0819   06/23/22 2100  ceFEPIme (MAXIPIME) 2 g in sodium chloride 0.9 % 100 mL IVPB        2 g 200 mL/hr over 30 Minutes Intravenous  Once 06/23/22 2006 06/23/22 2153   06/23/22 2100  metroNIDAZOLE (FLAGYL) IVPB 500 mg  Status:  Discontinued        500 mg 100 mL/hr over 60 Minutes Intravenous Every 12 hours 06/23/22 2006 06/28/22 0849   06/23/22 1600  vancomycin (VANCOREADY) IVPB 1500 mg/300 mL        1,500 mg 150 mL/hr over 120 Minutes Intravenous  Once 06/23/22 1514 06/23/22 1912   06/23/22 1430  vancomycin (VANCOCIN) IVPB 1000 mg/200 mL premix        1,000 mg 200 mL/hr over 60 Minutes Intravenous  Once 06/23/22 1418 06/23/22 1624   06/23/22 1430  cefTRIAXone (ROCEPHIN) 2 g in sodium chloride 0.9 % 100 mL IVPB        2 g 200 mL/hr over 30 Minutes Intravenous  Once 06/23/22 1418 06/23/22 1506        Assessment/Plan POD 12/10 s/p I&D of 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 Carolynne Edouard and Dr. Janee Morn on 07/01/22 and 07/03/22 respectively  - Wound debridement left chest wall necrotizing soft tissue infection now measures 30cm x 9cm x 4cm deep, incision and drainage left flank x 2  - Cont abx. Cx's from 12/5 (IR) with Prevotella species, beta lactamase positive, moderate actinomyces. Cx from 12/8 with prevotella species, beta lactamase +. Abx selection per ID.  - no need for further surgical debridement - Ok for discharge today if wound vac and home health arranged. I will send rx for pain medication to his pharmacy. Patient has follow up in wound care center    FEN: CM diet VTE: SCDs, LMWH ID: Augmentin per ID. Afebrile. WBC wnl.    - below per TRH -  T2DM, uncontrolled - A1c 11.6, SSI Alcohol abuse Tobacco abuse Obesity class II Hyponatremia - s/p lasix    LOS: 20 days    Franne Forts, [facility] 07/13/2022, 8:02 AM Please see Amion for pager number during day hours 7:00am-4:30pm

## 2022-07-13 NOTE — Inpatient Diabetes Management (Signed)
Inpatient Diabetes Program Recommendations  AACE/ADA: New Consensus Statement on Inpatient Glycemic Control   Target Ranges:  Prepandial:   less than 140 mg/dL      Peak postprandial:   less than 180 mg/dL (1-2 hours)      Critically ill patients:  140 - 180 mg/dL    Latest Reference Range & Units 07/12/22 06:09 07/12/22 12:22 07/12/22 16:07 07/12/22 21:11 07/13/22 06:03  Glucose-Capillary 70 - 99 mg/dL 458 (H) 099 (H) 833 (H) 132 (H) 140 (H)   Review of Glycemic Control  Diabetes history: No; new DM dx Outpatient Diabetes medications: NA Current orders for Inpatient glycemic control: 70/30 15 units BID, Novolog 0-15 units TID with meals, Novolog 0-5 units QHS   Inpatient Diabetes Program Recommendations:     Outpatient DM: At discharge please provide Rx for:  1) 70/30 Flex pen insulin- (Order [phone]) 2) insulin pen needles (Order [phone]) 3) glucose meter and supplies (Order # [id number])   NOTE: Spoke with patient at bedside and answered several questions he had regarding DM and insulin. Reviewed hypoglycemia, hyperglycemia, along with treatment for both. Discussed 70/30 and Novolog insulin and how they work. Discussed that he will be prescribed 70/30 15 units BID and Novolog correction scale. Discussed how to use correction scale. Asked patient to check glucose at least 4 times a day and to be sure to take his glucometer or a written log of glucose values to follow up visits. Asked patient to reach out to PCP if he has any issues with hypoglycemia or if he has consistently elevated glucose so they can advise him to make changes with insulin regimen if needed. Discussed insulin injection sites and importance of rotating insulin injections. Educated patient on insulin pen use at home. Reviewed all steps of insulin pen including attachment of needle, 2-unit air shot, dialing up dose, giving injection, removing needle, disposal of sharps, storage of unused insulin, disposal of insulin etc.  Patient able to provide successful return demonstration. Patient states he feels much better about DM and taking insulin. Patient verbalized understanding of information discussed. Spoke with Delaney Meigs, RN to make her aware that I have seen patient and answered his questions.  Thanks, Orlando Penner, RN, MSN, CDCES Diabetes Coordinator Inpatient Diabetes Program [phone] (Team Pager from 8am to 5pm)

## 2022-07-13 NOTE — Progress Notes (Signed)
07/13/2022  [name] DOB: [birth year] MRN: [id number]   RIDER WAIVER AND RELEASE OF LIABILITY  For the purposes of helping with transportation needs, [facility] partners with outside transportation providers (taxi companies, [place], Catering manager.) to give [employer] patients or other approved people the choice of on-demand rides [employer]") to our buildings for non-emergency visits.  By using [employer], I, the person signing this document, on behalf of myself and/or any legal minors (in my care using the [employer]), agree:  Science writer given to me are supplied by independent, outside transportation providers who do not work for, or have any affiliation with, [employer]. [place] is not a transportation company. [place] has no control over the quality or safety of the rides I get using [employer]. [place] has no control over whether any outside ride will happen on time or not. [place] gives no guarantee on the reliability, quality, safety, or availability on any rides, or that no mistakes will happen. I know and accept that traveling by vehicle (car, truck, SVU, Zenaida Niece, bus, taxi, etc.) has risks of serious injuries such as disability, being paralyzed, and death. I know and agree the risk of using [employer] is mine alone, and not [employer]. Transport Services are provided "as is" and as are available. The transportation providers are in charge for all inspections and care of the vehicles used to provide these rides. I agree not to take legal action against [place], its agents, employees, officers, directors, representatives, insurers, attorneys, assigns, successors, subsidiaries, and affiliates at any time for any reasons related directly or indirectly to using [employer]. I also agree not to take legal action against [place] or its affiliates for any injury, death, or damage to property caused by or related to using  [employer]. I have read this Waiver and Release of Liability, and I understand the terms used in it and their legal meaning. This Waiver is freely and voluntarily given with the understanding that my right (or any legal minors) to legal action against [place] relating to [employer] is knowingly given up to use these services.   I attest that I read the Ride Waiver and Release of Liability to [name], gave Mr. Brackins the opportunity to ask questions and answered the questions asked (if any). I affirm that [name] then provided consent for assistance with transportation.

## 2022-07-13 NOTE — TOC Transition Note (Addendum)
Transition of Care [facility]) - CM/SW Discharge Note   Patient Details  Name: [name] MRN: [id number] Date of Birth: Dec 21, 1985  [facility] CM/SW Contact:  Leone Haven, RN Phone Number: 07/13/2022, 11:14 AM   Clinical Narrative:    Patient is for dc today, NCM notified Amy with [facility], she states the [facility] will go out on Friday to change the wound vac, they will have him on a M W F schedule. He has follow up with Patient care center for PCP on AVS. He will also be going to wound clinic per AVS. This NCM is waiting to hear from [facility] with [employer] for the approval of the wound vac , once received will go up to get the wound vac and bring it down for Staff RN to apply. Patient states he will pay for himself an uber to transport home. Patient needs ast with transport home, he has signe the transport waiver ,it has been put on the chart.    Final next level of care: Home w Home Health Services Barriers to Discharge: Continued Medical Work up   Patient Goals and CMS Choice Patient states their goals for this hospitalization and ongoing recovery are:: return home CMS Medicare.gov Compare Post Acute Care list provided to:: Patient Choice offered to / list presented to : Patient    Discharge Placement                       Discharge Plan and Services In-house Referral: NA Discharge Planning Services: CM Consult Post Acute Care Choice: Home Health          DME Arranged: Negative pressure wound device DME Agency: [employer] Date DME Agency Contacted: 07/11/22 Time DME Agency Contacted: 1002 Representative spoke with at DME Agency: Kennyth Arnold HH Arranged: RN, PT HH Agency: [facility] Date [facility] Agency Contacted: 07/12/22 Time HH Agency Contacted: 1003 Representative spoke with at [facility] Agency: Amy  Social Determinants of Health (SDOH) Interventions     Readmission Risk Interventions     No data to display

## 2022-07-19 ENCOUNTER — Other Ambulatory Visit (HOSPITAL_COMMUNITY): Payer: Self-pay

## 2022-07-20 ENCOUNTER — Other Ambulatory Visit (HOSPITAL_COMMUNITY): Payer: Self-pay

## 2022-07-22 ENCOUNTER — Encounter: Payer: Self-pay | Admitting: Nurse Practitioner

## 2022-07-22 ENCOUNTER — Ambulatory Visit (INDEPENDENT_AMBULATORY_CARE_PROVIDER_SITE_OTHER): Payer: No Typology Code available for payment source | Admitting: Nurse Practitioner

## 2022-07-22 VITALS — BP 110/68 | HR 101 | Temp 97.3°F | Ht 69.0 in | Wt 241.4 lb

## 2022-07-22 DIAGNOSIS — L03313 Cellulitis of chest wall: Secondary | ICD-10-CM

## 2022-07-22 DIAGNOSIS — Z794 Long term (current) use of insulin: Secondary | ICD-10-CM

## 2022-07-22 DIAGNOSIS — F1010 Alcohol abuse, uncomplicated: Secondary | ICD-10-CM | POA: Diagnosis not present

## 2022-07-22 DIAGNOSIS — Z13220 Encounter for screening for lipoid disorders: Secondary | ICD-10-CM

## 2022-07-22 DIAGNOSIS — Z720 Tobacco use: Secondary | ICD-10-CM

## 2022-07-22 DIAGNOSIS — E1165 Type 2 diabetes mellitus with hyperglycemia: Secondary | ICD-10-CM

## 2022-07-22 DIAGNOSIS — L732 Hidradenitis suppurativa: Secondary | ICD-10-CM

## 2022-07-22 DIAGNOSIS — Z09 Encounter for follow-up examination after completed treatment for conditions other than malignant neoplasm: Secondary | ICD-10-CM

## 2022-07-22 MED ORDER — INSULIN ASPART PROT & ASPART (70-30 MIX) 100 UNIT/ML PEN: 15.0000 [IU] | PEN_INJECTOR | Freq: Two times a day (BID) | SUBCUTANEOUS | 3 refills | Status: DC

## 2022-07-22 NOTE — Assessment & Plan Note (Signed)
Has no previous PCP. Patient was on admission at the hospital for cellulitis of chest wall from June 23, 2022 to July 13, 2022.  He underwent I and the back surgery while at the hospital.  He was discharged home with a wound VAC, dressing changes being done by wound care nurse currently 2 times weekly, he is also taking Augmentin twice daily will be switched to amoxicillin twice daily on 08/11/2022.  Patient will need prolonged therapy for at least 6 months.  He has been taking oxycodone and Tylenol as needed.  States that he usually takes oxycodone before wound dressing changes.  Hospital discharge summary, labs and imaging studies were reviewed by me today.

## 2022-07-22 NOTE — Assessment & Plan Note (Signed)
Smokes about 3 cigarettes /day  Asked about quitting: confirms that he/she currently smokes cigarettes Advise to quit smoking: Educated about QUITTING to reduce the risk of cancer, cardio and cerebrovascular disease. Assess willingness: willing to quit at this time, will get nicotine patch OTC Assist with counseling and pharmacotherapy: Counseled for 5 minutes and literature provided. Arrange for follow up: follow up in 1 months and continue to offer help.

## 2022-07-22 NOTE — Assessment & Plan Note (Signed)
Need to quit drinking alcohol due to risk of liver disease, intoxication with discussed he verbalized understanding

## 2022-07-22 NOTE — Assessment & Plan Note (Addendum)
Post incision and drainage of the abscess Currently has a wound VAC in place, wound VAC dressing clean dry and intact Dressing changes done by wound care nurse Has upcoming appointment with ID Continue Augmentin 1 tablet twice daily switch to amoxicillin 500 mg 3 times daily on 08/11/2022. Signs of infection reviewed with the patient. Takes oxycodone 5-10 mg every 6 hours as needed and Tylenol 650 mg every 8 hours as needed for pain.  States that he has been taking oxycodone mostly during wound dressing changes. .  Checking CBC, CMP, chest x-ray as advised by hospital MD IR Recommends left breast mammogram when feasible

## 2022-07-22 NOTE — Patient Instructions (Addendum)
Goal for fasting blood sugar ranges from 80 to 120 and 2 hours after any meal or at bedtime should be between 130 to 170.  Please Get your chest xray done as dicussed    It is important that you exercise regularly at least 30 minutes 5 times a week as tolerated  Think about what you will eat, plan ahead. Choose " clean, green, fresh or frozen" over canned, processed or packaged foods which are more sugary, salty and fatty. 70 to 75% of food eaten should be vegetables and fruit. Three meals at set times with snacks allowed between meals, but they must be fruit or vegetables. Aim to eat over a 12 hour period , example 7 am to 7 pm, and STOP after  your last meal of the day. Drink water,generally about 64 ounces per day, no other drink is as healthy. Fruit juice is best enjoyed in a healthy way, by EATING the fruit.  Thanks for choosing [facility] we consider it a privelige to serve you.

## 2022-07-22 NOTE — Assessment & Plan Note (Signed)
  has an area with open skin under left armpit, clean and dry, pink in color.  No drainage or redness noted Need to avoid shaving was discussed with the patient, continue to keep skin clean and dry

## 2022-07-22 NOTE — Assessment & Plan Note (Signed)
New diagnosis of type 2 diabetes Lab Results  Component Value Date   HGBA1C 11.6 (H) 06/24/2022  Continue NovoLog sliding scale 3 times daily with meals, NovoLog 70/30 mix 15 units twice daily. Carb modified diet advised, CBG goals discussed Check urine albumin creatinine.  Lipid panel  today Patient referred to ophthalmology for diabetic eye exam Diabetic foot exam completed Not on ACE or ARB but his blood pressure is under control currently

## 2022-07-22 NOTE — Progress Notes (Signed)
 New Patient Office Visit  Subjective:  Patient ID: [name], male    DOB: 10/09/1985  Age: 36 y.o. MRN: [id number]  CC:  Chief Complaint  Patient presents with   Establish Care    HPI [name] is a 36 y.o. male with past medical history of uncontrolled type 2 diabetes, alcohol use, obesity, left chest wall cellulitis, hidradeniti supraneural fever presents to establish care for his chronic medical conditions and for hospital discharge follow-up.  has no previous PCP. Patient was on admission at the hospital for cellulitis of chest wall from June 23, 2022 to July 13, 2022.  He underwent I&D surgery while at the hospital.  He was discharged home with a wound VAC, dressing changes being done by wound care nurse currently 2 times weekly, he is also taking Augmentin twice daily will be switched to amoxicillin twice daily on 08/11/2022.  Patient will need prolonged therapy for at least 6 months.  He has been taking oxycodone and Tylenol as needed.  States that he usually takes oxycodone before wound dressing changes.    Type 2 diabetes.  Newly diagnosed while at the hospital.  He is currently on insulin aspart sliding scale 3 times daily, insulin 70/30, 15 units twice daily.  Patient denies hypoglycemia.  Reports CBG readings between 126-147.  Stated that he has not needed the sliding scale insulin.   Alcohol use.  Patient stated that he has had only 1 drink of alcohol since he was discharged from the hospital.  Need to avoid alcohol was discussed with the patient.  Tobacco use.  Patient stated that he was previously smoking 1 pack of cigarette daily for the past 15 years up to his last admission.  He is currently smoking about 3 cigarettes daily at the moment.  He plans to start using nicotine patches to help with smoking cessation.   Today patient denies chest pain, fever, chills, malaise, dizziness.       Past Medical History:  Diagnosis Date   Asthma    Breast abscess  06/28/2022    Past Surgical History:  Procedure Laterality Date   INCISION AND DRAINAGE ABSCESS Left 07/01/2022   Procedure: INCISION AND DRAINAGE ABSCESS;  Surgeon: Toth, Paul III, MD;  Location: [facility];  Service: General;  Laterality: Left;   IRRIGATION AND DEBRIDEMENT ABSCESS Left 07/03/2022   Procedure: IRRIGATION AND DEBRIDEMENT CHEST WALL / Breast ABSCESS;  Surgeon: Thompson, Burke, MD;  Location: [facility];  Service: General;  Laterality: Left;    Family History  Problem Relation Age of Onset   Diabetes Mother    Breast cancer Maternal Grandmother    Diabetes Paternal Grandmother    Diabetes Paternal Grandfather    Colon cancer Neg Hx     Social History   Socioeconomic History   Marital status: Single    Spouse name: Not on file   Number of children: 1   Years of education: Not on file   Highest education level: Not on file  Occupational History   Not on file  Tobacco Use   Smoking status: Every Day    Packs/day: 1.00    Types: Cigarettes   Smokeless tobacco: Never  Substance and Sexual Activity   Alcohol use: Yes    Alcohol/week: 13.0 standard drinks of alcohol    Types: 9 Cans of beer, 4 Shots of liquor per week   Drug use: Yes    Types: Marijuana   Sexual activity: Yes  Other Topics Concern     Not on file  Social History Narrative   Lives with his mother.    Social Determinants of Health   Financial Resource Strain: Not on file  Food Insecurity: No Food Insecurity (06/28/2022)   Hunger Vital Sign    Worried About Running Out of Food in the Last Year: Never true    Ran Out of Food in the Last Year: Never true  Transportation Needs: No Transportation Needs (06/28/2022)   PRAPARE - Transportation    Lack of Transportation (Medical): No    Lack of Transportation (Non-Medical): No  Physical Activity: Not on file  Stress: Not on file  Social Connections: Not on file  Intimate Partner Violence: Not At Risk (06/28/2022)   Humiliation, Afraid, Rape, and Kick  questionnaire    Fear of Current or Ex-Partner: No    Emotionally Abused: No    Physically Abused: No    Sexually Abused: No    ROS Review of Systems  Constitutional:  Negative for activity change, appetite change, chills, diaphoresis, fatigue, fever and unexpected weight change.  Respiratory: Negative.  Negative for apnea, cough, choking, chest tightness and wheezing.   Cardiovascular: Negative.  Negative for chest pain, palpitations and leg swelling.  Endocrine: Negative.  Negative for cold intolerance, heat intolerance, polydipsia, polyphagia and polyuria.  Musculoskeletal: Negative.   Neurological:  Negative for dizziness, seizures, facial asymmetry, light-headedness, numbness and headaches.  Psychiatric/Behavioral:  Negative for agitation, behavioral problems, confusion, decreased concentration and dysphoric mood.     Objective:   Today's Vitals: BP 110/68   Pulse (!) 101   Temp (!) 97.3 F (36.3 C)   Ht 5' 9" (1.753 m)   Wt 241 lb 6.4 oz (109.5 kg)   SpO2 100%   BMI 35.65 kg/m   Physical Exam Constitutional:      General: He is not in acute distress.    Appearance: He is obese. He is not ill-appearing, toxic-appearing or diaphoretic.  Eyes:     General: No scleral icterus.       Right eye: No discharge.        Left eye: No discharge.     Extraocular Movements: Extraocular movements intact.  Cardiovascular:     Rate and Rhythm: Regular rhythm.     Heart sounds: No murmur heard.    No friction rub. No gallop.  Pulmonary:     Effort: Pulmonary effort is normal. No respiratory distress.     Breath sounds: Normal breath sounds. No stridor. No wheezing, rhonchi or rales.     Comments: Wound vac on left chest wall, dressing clean and dry.  Chest:     Chest wall: No tenderness.  Abdominal:     General: There is no distension.     Palpations: Abdomen is soft. There is no mass.     Tenderness: There is no abdominal tenderness. There is no right CVA tenderness or  guarding.     Hernia: No hernia is present.  Musculoskeletal:        General: No swelling, tenderness, deformity or signs of injury.     Right lower leg: No edema.     Left lower leg: No edema.  Skin:    General: Skin is warm and dry.     Capillary Refill: Capillary refill takes less than 2 seconds.     Findings: Rash present.     Comments: An area of open skin noted  on left axilla, scarring consistent with HS noted. No redness, drainage or swelling noted.     Neurological:     Mental Status: He is alert.     Sensory: No sensory deficit.     Motor: No weakness.     Coordination: Coordination normal.     Gait: Gait normal.  Psychiatric:        Behavior: Behavior normal.        Thought Content: Thought content normal.        Judgment: Judgment normal.     Assessment & Plan:   Problem List Items Addressed This Visit       Endocrine   Type 2 diabetes mellitus with hyperglycemia (HCC)    New diagnosis of type 2 diabetes Lab Results  Component Value Date   HGBA1C 11.6 (H) 06/24/2022  Continue NovoLog sliding scale 3 times daily with meals, NovoLog 70/30 mix 15 units twice daily. Carb modified diet advised, CBG goals discussed Check urine albumin creatinine.  Lipid panel  today Patient referred to ophthalmology for diabetic eye exam Diabetic foot exam completed Not on ACE or ARB but his blood pressure is under control currently      Relevant Medications   insulin aspart protamine - aspart (NOVOLOG 70/30 MIX) (70-30) 100 UNIT/ML FlexPen   Other Relevant Orders   Microalbumin/Creatinine Ratio, Urine   POCT glycosylated hemoglobin (Hb A1C)   Ambulatory referral to Ophthalmology   CBC   CMP14+EGFR     Musculoskeletal and Integument   Hidradenitis suppurativa     has an area with open skin under left armpit, clean and dry, pink in color.  No drainage or redness noted Need to avoid shaving was discussed with the patient, continue to keep skin clean and dry         Other    Cellulitis of chest wall - Primary    Post incision and drainage of the abscess Currently has a wound VAC in place, wound VAC dressing clean dry and intact Dressing changes done by wound care nurse Has upcoming appointment with ID Continue Augmentin 1 tablet twice daily switch to amoxicillin 500 mg 3 times daily on 08/11/2022. Signs of infection reviewed with the patient. Takes oxycodone 5-10 mg every 6 hours as needed and Tylenol 650 mg every 8 hours as needed for pain.  States that he has been taking oxycodone mostly during wound dressing changes. .  Checking CBC, CMP, chest x-ray as advised by hospital MD IR Recommends left breast mammogram when feasible       Relevant Orders   CBC   DG Chest 2 View   CMP14+EGFR   Tobacco abuse    Smokes about 3 cigarettes /day  Asked about quitting: confirms that he/she currently smokes cigarettes Advise to quit smoking: Educated about QUITTING to reduce the risk of cancer, cardio and cerebrovascular disease. Assess willingness: willing to quit at this time, will get nicotine patch OTC Assist with counseling and pharmacotherapy: Counseled for 5 minutes and literature provided. Arrange for follow up: follow up in 1 months and continue to offer help.       Alcohol abuse    Need to quit drinking alcohol due to risk of liver disease, intoxication with discussed he verbalized understanding      Hospital discharge follow-up     Has no previous PCP. Patient was on admission at the hospital for cellulitis of chest wall from June 23, 2022 to July 13, 2022.  He underwent I and the back surgery while at the hospital.  He was discharged home with a wound VAC, dressing changes being   done by wound care nurse currently 2 times weekly, he is also taking Augmentin twice daily will be switched to amoxicillin twice daily on 08/11/2022.  Patient will need prolonged therapy for at least 6 months.  He has been taking oxycodone and Tylenol as needed.  States  that he usually takes oxycodone before wound dressing changes.  Hospital discharge summary, labs and imaging studies were reviewed by me today.       Relevant Orders   CBC   DG Chest 2 View   Other Visit Diagnoses     Screening for lipid disorders       Relevant Orders   Lipid Panel       Outpatient Encounter Medications as of 07/22/2022  Medication Sig   Accu-Chek FastClix Lancets MISC Use as directed 4 times a day before meals and at bedtime   acetaminophen (TYLENOL) 325 MG tablet Take 2 tablets (650 mg total) by mouth every 8 (eight) hours as needed for moderate pain.   amoxicillin-clavulanate (AUGMENTIN) 875-125 MG tablet Take 1 tablet by mouth 2 (two) times daily. Take until ID clinic visit on 08/10/22   blood glucose meter kit and supplies KIT Use as directed   glucose blood test strip Use as directed 4 times a day before meals and at bedtime   insulin aspart (NOVOLOG) 100 UNIT/ML FlexPen Before each meal 3 times a day, CBG 140-199 - 2 units, 200-250 - 4 units, 251-299 - 6 units,  300-349 - 8 units,  350 or above-10 units.   Insulin Pen Needle 32G X 4 MM MISC Use as directed for 4 times a day for insulin pens   oxyCODONE 10 MG TABS Take 0.5-1 tablets (5-10 mg total) by mouth every 6 (six) hours as needed for severe pain or moderate pain (58m for moderate pain, 171mfor severe pain).   Probiotic Product (RISA-BID PROBIOTIC) TABS Take 1 tablet by mouth 2 (two) times daily.   [DISCONTINUED] insulin aspart protamine - aspart (NOVOLOG 70/30 MIX) (70-30) 100 UNIT/ML FlexPen Inject 15 Units into the skin 2 (two) times daily with a meal.   [START ON 08/11/2022] amoxicillin (AMOXIL) 500 MG capsule Take 1 capsule (500 mg total) by mouth 3 (three) times daily for 40 days. START TAKING on 08/11/2022 after you finish Augmentin (amoxicillin/clavulanic acid) (Patient not taking: Reported on 07/22/2022)   ibuprofen (ADVIL) 200 MG tablet Take 200 mg by mouth every 6 (six) hours as needed for mild  pain. (Patient not taking: Reported on 07/22/2022)   insulin aspart protamine - aspart (NOVOLOG 70/30 MIX) (70-30) 100 UNIT/ML FlexPen Inject 15 Units into the skin 2 (two) times daily with a meal.   No facility-administered encounter medications on file as of 07/22/2022.    Follow-up: Return in about 3 months (around 10/21/2022) for CPE.   FoRenee RivalFNP

## 2022-07-23 LAB — CMP14+EGFR
ALT: 29 IU/L (ref 0–44)
AST: 31 IU/L (ref 0–40)
Albumin/Globulin Ratio: 0.8 — ABNORMAL LOW (ref 1.2–2.2)
Albumin: 3.6 g/dL — ABNORMAL LOW (ref 4.1–5.1)
Alkaline Phosphatase: 130 IU/L — ABNORMAL HIGH (ref 44–121)
BUN/Creatinine Ratio: 12 (ref 9–20)
BUN: 9 mg/dL (ref 6–20)
Bilirubin Total: 0.3 mg/dL (ref 0.0–1.2)
CO2: 22 mmol/L (ref 20–29)
Calcium: 9.9 mg/dL (ref 8.7–10.2)
Chloride: 100 mmol/L (ref 96–106)
Creatinine, Ser: 0.78 mg/dL (ref 0.76–1.27)
Globulin, Total: 4.5 g/dL (ref 1.5–4.5)
Glucose: 142 mg/dL — ABNORMAL HIGH (ref 70–99)
Potassium: 4.2 mmol/L (ref 3.5–5.2)
Sodium: 137 mmol/L (ref 134–144)
Total Protein: 8.1 g/dL (ref 6.0–8.5)
eGFR: 117 mL/min/{1.73_m2} (ref >59)

## 2022-07-23 LAB — CBC
Hematocrit: 34.1 % — ABNORMAL LOW (ref 37.5–51.0)
Hemoglobin: 11.2 g/dL — ABNORMAL LOW (ref 13.0–17.7)
MCH: 26.7 pg (ref 26.6–33.0)
MCHC: 32.8 g/dL (ref 31.5–35.7)
MCV: 81 fL (ref 79–97)
Platelets: 605 10*3/uL — ABNORMAL HIGH (ref 150–450)
RBC: 4.19 x10E6/uL (ref 4.14–5.80)
RDW: 13.9 % (ref 11.6–15.4)
WBC: 6.4 10*3/uL (ref 3.4–10.8)

## 2022-07-23 LAB — LIPID PANEL
Chol/HDL Ratio: 6 ratio — ABNORMAL HIGH (ref 0.0–5.0)
Cholesterol, Total: 257 mg/dL — ABNORMAL HIGH (ref 100–199)
HDL: 43 mg/dL (ref >39)
LDL Chol Calc (NIH): 175 mg/dL — ABNORMAL HIGH (ref 0–99)
Triglycerides: 209 mg/dL — ABNORMAL HIGH (ref 0–149)
VLDL Cholesterol Cal: 39 mg/dL (ref 5–40)

## 2022-07-23 LAB — MICROALBUMIN / CREATININE URINE RATIO
Creatinine, Urine: 166.6 mg/dL
Microalb/Creat Ratio: 137 mg/g creat — ABNORMAL HIGH (ref 0–29)
Microalbumin, Urine: 228.9 ug/mL

## 2022-07-26 ENCOUNTER — Other Ambulatory Visit: Payer: Self-pay | Admitting: Nurse Practitioner

## 2022-07-26 DIAGNOSIS — E782 Mixed hyperlipidemia: Secondary | ICD-10-CM

## 2022-07-26 MED ORDER — ATORVASTATIN CALCIUM 10 MG PO TABS: 10.0000 mg | ORAL_TABLET | Freq: Every day | ORAL | 0 refills | Status: DC

## 2022-07-26 NOTE — Progress Notes (Signed)
Platelet number is slowly trending down, this lab was probably elevated due to his chest wall cellulitis we will monitor labs.  Blood count  has also improved from last labs.  Liver enzyme remains elevated avoid use of alcohol.  Microalbuminuria.  It is important that we get diabetes under control to avoid kidney damage   Hyperlipidemia .  Start taking atorvastatin 10 mg daily  to avoid risk of stroke, heart attack.  eat a healthy diet, including lots of fruits and vegetables. Avoid foods with a lot of saturated and trans fats, such as red meat, butter, fried foods and cheese . Attain  a healthy weight.

## 2022-07-27 ENCOUNTER — Telehealth: Payer: Self-pay | Admitting: Nurse Practitioner

## 2022-07-27 NOTE — Telephone Encounter (Signed)
Pplease call pt regarding his medication - he was prescribed to something new and is confused.

## 2022-08-01 ENCOUNTER — Encounter: Payer: No Typology Code available for payment source | Admitting: Internal Medicine

## 2022-08-04 ENCOUNTER — Other Ambulatory Visit (HOSPITAL_COMMUNITY): Payer: Self-pay

## 2022-08-08 ENCOUNTER — Encounter (HOSPITAL_BASED_OUTPATIENT_CLINIC_OR_DEPARTMENT_OTHER): Payer: No Typology Code available for payment source | Attending: Internal Medicine | Admitting: Internal Medicine

## 2022-08-08 DIAGNOSIS — L732 Hidradenitis suppurativa: Secondary | ICD-10-CM | POA: Insufficient documentation

## 2022-08-08 DIAGNOSIS — L02213 Cutaneous abscess of chest wall: Secondary | ICD-10-CM | POA: Insufficient documentation

## 2022-08-08 DIAGNOSIS — L98498 Non-pressure chronic ulcer of skin of other sites with other specified severity: Secondary | ICD-10-CM | POA: Diagnosis not present

## 2022-08-08 DIAGNOSIS — J45909 Unspecified asthma, uncomplicated: Secondary | ICD-10-CM | POA: Diagnosis not present

## 2022-08-08 DIAGNOSIS — F17210 Nicotine dependence, cigarettes, uncomplicated: Secondary | ICD-10-CM | POA: Diagnosis not present

## 2022-08-08 DIAGNOSIS — E11622 Type 2 diabetes mellitus with other skin ulcer: Secondary | ICD-10-CM | POA: Insufficient documentation

## 2022-08-08 DIAGNOSIS — T8131XD Disruption of external operation (surgical) wound, not elsewhere classified, subsequent encounter: Secondary | ICD-10-CM | POA: Insufficient documentation

## 2022-08-08 DIAGNOSIS — E669 Obesity, unspecified: Secondary | ICD-10-CM | POA: Diagnosis not present

## 2022-08-08 DIAGNOSIS — Z6834 Body mass index (BMI) 34.0-34.9, adult: Secondary | ICD-10-CM | POA: Insufficient documentation

## 2022-08-10 ENCOUNTER — Other Ambulatory Visit: Payer: Self-pay

## 2022-08-10 ENCOUNTER — Ambulatory Visit (INDEPENDENT_AMBULATORY_CARE_PROVIDER_SITE_OTHER): Payer: No Typology Code available for payment source | Admitting: Infectious Disease

## 2022-08-10 ENCOUNTER — Encounter: Payer: Self-pay | Admitting: Infectious Disease

## 2022-08-10 VITALS — BP 152/108 | HR 107 | Temp 97.7°F | Ht 70.0 in | Wt 236.0 lb

## 2022-08-10 DIAGNOSIS — M60009 Infective myositis, unspecified site: Secondary | ICD-10-CM

## 2022-08-10 DIAGNOSIS — A429 Actinomycosis, unspecified: Secondary | ICD-10-CM

## 2022-08-10 DIAGNOSIS — L732 Hidradenitis suppurativa: Secondary | ICD-10-CM | POA: Diagnosis not present

## 2022-08-10 DIAGNOSIS — E1165 Type 2 diabetes mellitus with hyperglycemia: Secondary | ICD-10-CM

## 2022-08-10 DIAGNOSIS — L304 Erythema intertrigo: Secondary | ICD-10-CM

## 2022-08-10 DIAGNOSIS — L02213 Cutaneous abscess of chest wall: Secondary | ICD-10-CM

## 2022-08-10 DIAGNOSIS — Z7185 Encounter for immunization safety counseling: Secondary | ICD-10-CM

## 2022-08-10 HISTORY — DX: Erythema intertrigo: L30.4

## 2022-08-10 HISTORY — DX: Cutaneous abscess of chest wall: L02.213

## 2022-08-10 HISTORY — DX: Actinomycosis, unspecified: A42.9

## 2022-08-10 MED ORDER — FLUCONAZOLE 100 MG PO TABS: 100.0000 mg | ORAL_TABLET | Freq: Every day | ORAL | 4 refills | Status: DC

## 2022-08-10 MED ORDER — AMOXICILLIN 500 MG PO CAPS: 500.0000 mg | ORAL_CAPSULE | Freq: Three times a day (TID) | ORAL | 4 refills | Status: DC

## 2022-08-10 MED ORDER — NYSTATIN 100000 UNIT/GM EX POWD: 1.0000 | Freq: Three times a day (TID) | CUTANEOUS | 5 refills | Status: DC

## 2022-08-10 NOTE — Progress Notes (Signed)
Subjective:  Chief complaint: followup for extensive chest wall infection   Patient ID: [name], male    DOB: September 03, 1985, 37 y.o.   MRN: [id number]  HPI  37 y.o. male with morbid obesity newly diagnosed noticed diabetes mellitus likely retinitis suppurativa with recurrent infections in his soft tissues in particular axilla groin now admitted  with massive soft tissue chest wall abscess sp multiple debridments.    Prevotella and actinomyces been isolated.  He was on antibiotics but later simplified to Unasyn and then Augmentin.  He was discharged with Augmentin as well as a supply of amoxicillin which she is to switch to once he completes Augmentin.  About to finish Augmentin today and switch over to amoxicillin tomorrow.  He has developed what sounds like a yeast infection in his groin.  He is following with [facility] surgery now going to see wound care the wound vacuum has come off and now he has a dressing over his wound.   Is more weak than he did before and seems to be deconditioned from his extensive hospital stay.   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   Asthma    Breast abscess 06/28/2022    Past Surgical History:  Procedure Laterality Date   INCISION AND DRAINAGE ABSCESS Left 07/01/2022   Procedure: INCISION AND DRAINAGE ABSCESS;  Surgeon: Jovita Kussmaul, MD;  Location: [facility];  Service: General;  Laterality: Left;   IRRIGATION AND DEBRIDEMENT ABSCESS Left 07/03/2022   Procedure: IRRIGATION AND DEBRIDEMENT CHEST WALL / Breast ABSCESS;  Surgeon: Georganna Skeans, MD;  Location: [facility];  Service: General;  Laterality: Left;    Family History  Problem Relation Age of Onset   Diabetes Mother    Breast cancer Maternal Grandmother    Diabetes Paternal Grandmother    Diabetes Paternal Grandfather    Colon cancer Neg Hx       Social History   Socioeconomic History   Marital status: Single    Spouse name: Not on file    Number of children: 1   Years of education: Not on file   Highest education level: Not on file  Occupational History   Not on file  Tobacco Use   Smoking status: Every Day    Packs/day: 1.00    Types: Cigarettes   Smokeless tobacco: Never  Substance and Sexual Activity   Alcohol use: Yes    Alcohol/week: 13.0 standard drinks of alcohol    Types: 9 Cans of beer, 4 Shots of liquor per week   Drug use: Yes    Types: Marijuana   Sexual activity: Yes  Other Topics Concern   Not on file  Social History Narrative   Lives with his mother.    Social Determinants of Health   Financial Resource Strain: Not on file  Food Insecurity: No Food Insecurity (06/28/2022)   Hunger Vital Sign    Worried About Running Out of Food in the Last Year: Never true    Ran Out of Food in the Last Year: Never true  Transportation Needs: No Transportation Needs (06/28/2022)   PRAPARE - Hydrologist (Medical): No    Lack of Transportation (Non-Medical): No  Physical Activity: Not on file  Stress: Not on file  Social Connections: Not on file    Allergies  Allergen Reactions   Peach [Prunus Persica]     Pt reports swelling and lip tingling as reaction  Current Outpatient Medications:    atorvastatin (LIPITOR) 10 MG tablet, Take 1 tablet (10 mg total) by mouth daily., Disp: 90 tablet, Rfl: 0   Accu-Chek FastClix Lancets MISC, Use as directed 4 times a day before meals and at bedtime, Disp: 102 each, Rfl: 0   acetaminophen (TYLENOL) 325 MG tablet, Take 2 tablets (650 mg total) by mouth every 8 (eight) hours as needed for moderate pain., Disp: 20 tablet, Rfl: 0   [START ON 08/11/2022] amoxicillin (AMOXIL) 500 MG capsule, Take 1 capsule (500 mg total) by mouth 3 (three) times daily for 40 days. START TAKING on 08/11/2022 after you finish Augmentin (amoxicillin/clavulanic acid) (Patient not taking: Reported on 07/22/2022), Disp: 120 capsule, Rfl: 0   amoxicillin-clavulanate  (AUGMENTIN) 875-125 MG tablet, Take 1 tablet by mouth 2 (two) times daily. Take until ID clinic visit on 08/10/22, Disp: 68 tablet, Rfl: 0   blood glucose meter kit and supplies KIT, Use as directed, Disp: 1 each, Rfl: 0   glucose blood test strip, Use as directed 4 times a day before meals and at bedtime, Disp: 100 each, Rfl: 0   ibuprofen (ADVIL) 200 MG tablet, Take 200 mg by mouth every 6 (six) hours as needed for mild pain. (Patient not taking: Reported on 07/22/2022), Disp: , Rfl:    insulin aspart (NOVOLOG) 100 UNIT/ML FlexPen, Before each meal 3 times a day, CBG 140-199 - 2 units, 200-250 - 4 units, 251-299 - 6 units,  300-349 - 8 units,  350 or above-10 units., Disp: 15 mL, Rfl: 0   insulin aspart protamine - aspart (NOVOLOG 70/30 MIX) (70-30) 100 UNIT/ML FlexPen, Inject 15 Units into the skin 2 (two) times daily with a meal., Disp: 9 mL, Rfl: 3   Insulin Pen Needle 32G X 4 MM MISC, Use as directed for 4 times a day for insulin pens, Disp: 100 each, Rfl: 0   oxyCODONE 10 MG TABS, Take 0.5-1 tablets (5-10 mg total) by mouth every 6 (six) hours as needed for severe pain or moderate pain (5mg  for moderate pain, 10mg  for severe pain)., Disp: 25 tablet, Rfl: 0   Probiotic Product (RISA-BID PROBIOTIC) TABS, Take 1 tablet by mouth 2 (two) times daily., Disp: 60 tablet, Rfl: 2   Review of Systems  Constitutional:  Negative for activity change, appetite change, chills, diaphoresis, fatigue, fever and unexpected weight change.  HENT:  Negative for congestion, rhinorrhea, sinus pressure, sneezing, sore throat and trouble swallowing.   Eyes:  Negative for photophobia and visual disturbance.  Respiratory:  Negative for cough, chest tightness, shortness of breath, wheezing and stridor.   Cardiovascular:  Negative for chest pain, palpitations and leg swelling.  Gastrointestinal:  Negative for abdominal distention, abdominal pain, anal bleeding, blood in stool, constipation, diarrhea, nausea and vomiting.   Genitourinary:  Negative for difficulty urinating, dysuria, flank pain and hematuria.  Musculoskeletal:  Negative for arthralgias, back pain, gait problem, joint swelling and myalgias.  Skin:  Negative for color change, pallor, rash and wound.  Neurological:  Positive for weakness. Negative for dizziness, tremors and light-headedness.  Hematological:  Negative for adenopathy. Does not bruise/bleed easily.  Psychiatric/Behavioral:  Negative for agitation, behavioral problems, confusion, decreased concentration, dysphoric mood and sleep disturbance.        Objective:   Physical Exam Constitutional:      Appearance: He is well-developed. He is obese.  HENT:     Head: Normocephalic and atraumatic.  Eyes:     Conjunctiva/sclera: Conjunctivae normal.  Cardiovascular:  Rate and Rhythm: Normal rate and regular rhythm.  Pulmonary:     Effort: Pulmonary effort is normal. No respiratory distress.     Breath sounds: No wheezing.  Abdominal:     General: There is no distension.     Palpations: Abdomen is soft.  Musculoskeletal:        General: No tenderness. Normal range of motion.     Cervical back: Normal range of motion and neck supple.  Skin:    General: Skin is warm and dry.     Coloration: Skin is not pale.     Findings: No erythema or rash.  Neurological:     General: No focal deficit present.     Mental Status: He is alert and oriented to person, place, and time.  Psychiatric:        Mood and Affect: Mood normal.        Behavior: Behavior normal.        Thought Content: Thought content normal.        Judgment: Judgment normal.           Assessment & Plan:   Extensive chest wall infection: polymicrobial but only with 10 MIC's have been isolated switch over to amoxicillin and complete 6 months of therapy in total.  I checked a sed rate CRP CMP with GFR and CBC with differential  Sent a new prescription for amoxicillin to his pharmacy with 5 refills.  [facility] I  have sent a prescription for fluconazole for this and also nystatin powder they can take either his treatment more importantly a useful topical agents used for prevention of yeast infections.  Diabetes mellitus will follow-up with primary care currently on insulin.  Vaccine counseling recommended COVID and flu vaccines but he did not want them today

## 2022-08-11 LAB — CBC WITH DIFFERENTIAL/PLATELET
Absolute Monocytes: 308 cells/uL (ref 200–950)
Basophils Absolute: 51 cells/uL (ref 0–200)
Basophils Relative: 0.9 %
Eosinophils Absolute: 188 cells/uL (ref 15–500)
Eosinophils Relative: 3.3 %
HCT: 37.3 % — ABNORMAL LOW (ref 38.5–50.0)
Hemoglobin: 12.5 g/dL — ABNORMAL LOW (ref 13.2–17.1)
Lymphs Abs: 2725 cells/uL (ref 850–3900)
MCH: 27.1 pg (ref 27.0–33.0)
MCHC: 33.5 g/dL (ref 32.0–36.0)
MCV: 80.9 fL (ref 80.0–100.0)
MPV: 10.1 fL (ref 7.5–12.5)
Monocytes Relative: 5.4 %
Neutro Abs: 2428 cells/uL (ref 1500–7800)
Neutrophils Relative %: 42.6 %
Platelets: 442 10*3/uL — ABNORMAL HIGH (ref 140–400)
RBC: 4.61 10*6/uL (ref 4.20–5.80)
RDW: 14.4 % (ref 11.0–15.0)
Total Lymphocyte: 47.8 %
WBC: 5.7 10*3/uL (ref 3.8–10.8)

## 2022-08-11 LAB — COMPLETE METABOLIC PANEL WITH GFR
AG Ratio: 0.9 (calc) — ABNORMAL LOW (ref 1.0–2.5)
ALT: 18 U/L (ref 9–46)
AST: 14 U/L (ref 10–40)
Albumin: 3.6 g/dL (ref 3.6–5.1)
Alkaline phosphatase (APISO): 86 U/L (ref 36–130)
BUN: 7 mg/dL (ref 7–25)
CO2: 27 mmol/L (ref 20–32)
Calcium: 9.7 mg/dL (ref 8.6–10.3)
Chloride: 105 mmol/L (ref 98–110)
Creat: 0.69 mg/dL (ref 0.60–1.26)
Globulin: 4.1 g/dL (calc) — ABNORMAL HIGH (ref 1.9–3.7)
Glucose, Bld: 188 mg/dL — ABNORMAL HIGH (ref 65–99)
Potassium: 4.5 mmol/L (ref 3.5–5.3)
Sodium: 141 mmol/L (ref 135–146)
Total Bilirubin: 0.4 mg/dL (ref 0.2–1.2)
Total Protein: 7.7 g/dL (ref 6.1–8.1)
eGFR: 121 mL/min/{1.73_m2} (ref >=60)

## 2022-08-11 LAB — C-REACTIVE PROTEIN: CRP: 2.7 mg/L (ref <8.0)

## 2022-08-11 LAB — SEDIMENTATION RATE: Sed Rate: 60 mm/h — ABNORMAL HIGH (ref 0–15)

## 2022-08-12 ENCOUNTER — Telehealth: Payer: Self-pay | Admitting: Nurse Practitioner

## 2022-08-12 ENCOUNTER — Other Ambulatory Visit: Payer: Self-pay | Admitting: Nurse Practitioner

## 2022-08-12 MED ORDER — INSULIN LISPRO PROT & LISPRO (75-25 MIX) 100 UNIT/ML KWIKPEN: 15.0000 [IU] | PEN_INJECTOR | Freq: Two times a day (BID) | SUBCUTANEOUS | 11 refills | Status: DC

## 2022-08-12 NOTE — Telephone Encounter (Signed)
Tried to call patient for clarification and was unable to lmom when there was no answer.

## 2022-08-12 NOTE — Telephone Encounter (Signed)
Caller & Relationship to patient:  MRN #  [id number]   Call Back Number:   Date of Last Office Visit: 07/27/2022     Date of Next Office Visit: 10/26/2022    Medication(s) to be Refilled: humalog mix kwipen  [facility]: [facility]  Insurance will ONLY cover not current insulin  ** Please notify patient to allow 48-72 hours to process** **Let patient know to contact pharmacy at the end of the day to make sure medication is ready. ** **If patient has not been seen in a year or longer, book an appointment **Advise to use MyChart for refill requests OR to contact their pharmacy

## 2022-08-15 ENCOUNTER — Encounter (HOSPITAL_BASED_OUTPATIENT_CLINIC_OR_DEPARTMENT_OTHER): Payer: No Typology Code available for payment source | Admitting: Internal Medicine

## 2022-08-15 DIAGNOSIS — E11622 Type 2 diabetes mellitus with other skin ulcer: Secondary | ICD-10-CM

## 2022-08-15 DIAGNOSIS — L732 Hidradenitis suppurativa: Secondary | ICD-10-CM

## 2022-08-15 DIAGNOSIS — L98498 Non-pressure chronic ulcer of skin of other sites with other specified severity: Secondary | ICD-10-CM

## 2022-08-15 DIAGNOSIS — T8131XD Disruption of external operation (surgical) wound, not elsewhere classified, subsequent encounter: Secondary | ICD-10-CM

## 2022-08-16 NOTE — Progress Notes (Signed)
[name], [name] ([id number]) [id number] Page 1 of 8 Visit Report for 08/15/2022 Chief Complaint Document Details Patient Name: Date of Service: [name], [name]. 08/15/2022 1:45 PM Medical Record Number: [id number] Patient Account Number: [id number] Date of Birth/Sex: Treating RN: 10-01-1985 (37 y.o. M) Primary Care Provider: Vena [name] Other Clinician: Referring Provider: Treating Provider/Extender: [name] in Treatment: 1 Information Obtained from: Patient Chief Complaint 1/52/23; patient is here predominantly for Caleb surgical wound in the left inframammary area extending into the left lateral chest. Patient also has Caleb small area on the right inframammary area probably related to hidradenitis Electronic Signature(s) Signed: 08/16/2022 10:49:17 AM By: Caleb Shan DO Entered By: [name] on 08/15/2022 14:21:40 -------------------------------------------------------------------------------- HPI Details Patient Name: Date of Service: [name], [name]. 08/15/2022 1:45 PM Medical Record Number: [id number] Patient Account Number: [id number] Date of Birth/Sex: Treating RN: 05-05-1986 (37 y.o. M) Primary Care Provider: Vena [name] Other Clinician: Referring Provider: Treating Provider/Extender: [name] in Treatment: 1 History of Present Illness HPI Description: ADMISSION 08/08/2021 This is Caleb 37 year old man who underwent an extensive hospitalization from 06/23/2022 through 07/13/2022 with Caleb large necrotic area on the left inframammary area extending into the left lateral chest. The patient states this came on fairly rapidly initially with Caleb boil. This was aspirated on 12/5 and he underwent an extensive operative debridement ultimately on 12/8. Cultures showed rare Prevotella and moderate actinomyces. He initially was treated with daptomycin and Unasyn in the hospital then  Augmentin and more recently he is continued on amoxicillin. [name] with Dr. Drucilla [name] in Caleb few days. During the course of the hospitalization he was discovered to be Caleb new onset diabetic with Caleb hemoglobin A1c of 11.6. Since his discharge from the hospital he has been followed using Caleb wound VAC on the surgical area. There is an impressive postoperative picture in epic sewing Caleb large open surgical wound. There is been considerable improvement with the wound VAC being changed by home health. I cannot see the notes from either infectious disease or surgery Past medical history includes obesity, new onset diabetes as mentioned. He was Caleb smoker. 1/22; patient presents for follow-up. He has been using silver alginate 3 times weekly. He has some skin irritation to the left sided periwound. He had follow-up with infectious disease, Dr. Drucilla [name] on 1/17. He is currently taking amoxicillin and plan is to continue this for the next 5 months. Electronic Signature(s) Signed: 08/16/2022 10:49:17 AM By: Caleb Shan DO Entered By: [name] on 08/15/2022 14:27:47 [name] ([id number]) [id number] Page 2 of 8 -------------------------------------------------------------------------------- Physical Exam Details Patient Name: Date of Service: [name], [name]. 08/15/2022 1:45 PM Medical Record Number: [id number] Patient Account Number: [id number] Date of Birth/Sex: Treating RN: 06-Jan-1986 (37 y.o. M) Primary Care Provider: Vena [name] Other Clinician: Referring Provider: Treating Provider/Extender: [name] in Treatment: 1 Constitutional respirations regular, non-labored and within target range for patient.Marland Kitchen Psychiatric pleasant and cooperative. Notes Narrowed wound to the left inframamillary area extending into the left lateral chest wall. This has granulation tissue throughout. No tunneling or undermining. T o the  periwound there is skin breakdown with irritation. No surrounding signs of infection. T the right side under the breast there is Caleb small open area consistent o with hidradenitis that tunnels at the 1 o'clock position. Electronic Signature(s) Signed: 08/16/2022 10:49:17 AM By: Caleb Shan DO Entered By: [name]  on 08/15/2022 14:29:31 -------------------------------------------------------------------------------- Physician Orders Details Patient Name: Date of Service: [name], [name]. 08/15/2022 1:45 PM Medical Record Number: [id number] Patient Account Number: [id number] Date of Birth/Sex: Treating RN: 06/05/1986 (37 y.o. [name]) [name] Primary Care Provider: Edwin DadaPaseda, [name] Other Clinician: Referring Provider: Treating Provider/Extender: [name], [name], [name] [name] in Treatment: 1 Verbal / Phone Orders: No Diagnosis Coding ICD-10 Coding Code Description T81.31XD Disruption of external operation (surgical) wound, not elsewhere classified, subsequent encounter L02.213 Cutaneous abscess of chest wall L98.498 Non-pressure chronic ulcer of skin of other sites with other specified severity L73.2 Hidradenitis suppurativa E11.622 Type 2 diabetes mellitus with other skin ulcer Follow-up Appointments ppointment in 1 week. - [name] and Caleb KendallBobbi 08/22/2022 130pm Return Caleb ppointment in 2 [name]. - [name] and Caleb KendallBobbi 130pm 08/29/2022 Return Caleb Other: - Infectious disease follow up 08/10/2022. Anesthetic (In clinic) Topical Lidocaine 4% applied to wound bed Bathing/ Shower/ Hygiene May shower and wash wound with soap and water. Negative Presssure Wound Therapy Discontinue wound vac. Call the number on the machine for the company to pick up. [facility] Caleb HartshornJONES, [name] ([id number]) [id number] Page 3 of 8 New wound care orders this week; continue Home Health for wound care. May utilize formulary equivalent dressing for wound  treatment orders unless otherwise specified. - x2 week wound care changes. wound center weekly. change to vashe wet to dry dressings to all wounds. Other Home Health Orders/Instructions: - [facility] Wound Treatment Wound #1 - Chest Wound Laterality: Left Cleanser: Soap and Water 3 x Per Week/30 Days Discharge Instructions: May shower and wash wound with dial antibacterial soap and water prior to dressing change. Cleanser: Wound Cleanser (Home Health) 3 x Per Week/30 Days Discharge Instructions: Cleanse the wound with wound cleanser prior to applying Caleb clean dressing using gauze sponges, not tissue or cotton balls. Peri-Wound Care: Skin Prep (Home Health) 3 x Per Week/30 Days Discharge Instructions: Use skin prep as directed Peri-Wound Care: Zinc Oxide Ointment 30g tube (Home Health) 3 x Per Week/30 Days Discharge Instructions: Apply Zinc Oxide to periwound with each dressing change due to skin irritation Prim Dressing: vashe wet to dry (Home Health) 3 x Per Week/30 Days ary Discharge Instructions: apply soaked vashe gauze directly to wound bed. Secondary Dressing: ABD Pad, 8x10 (Home Health) 3 x Per Week/30 Days Discharge Instructions: Apply over primary dressing as directed. Wound #2 - Chest Wound Laterality: Right, Midline Cleanser: Soap and Water 3 x Per Week/30 Days Discharge Instructions: May shower and wash wound with dial antibacterial soap and water prior to dressing change. Cleanser: Wound Cleanser (Home Health) 3 x Per Week/30 Days Discharge Instructions: Cleanse the wound with wound cleanser prior to applying Caleb clean dressing using gauze sponges, not tissue or cotton balls. Peri-Wound Care: Skin Prep (Home Health) 3 x Per Week/30 Days Discharge Instructions: Use skin prep as directed Peri-Wound Care: Zinc Oxide Ointment 30g tube (Home Health) 3 x Per Week/30 Days Discharge Instructions: Apply Zinc Oxide to periwound with each dressing change due to skin irritation Prim  Dressing: vashe wet to dry (Home Health) 3 x Per Week/30 Days ary Discharge Instructions: apply soaked vashe gauze directly to wound bed. Secondary Dressing: ABD Pad, 8x10 (Home Health) 3 x Per Week/30 Days Discharge Instructions: Apply over primary dressing as directed. Cons[facility]ermat[facility], see any partner, related to Hidradenitis suppurativa. - (ICD10 L73.2 - Hidradenitis suppurativa) Electronic Signature(s) Signed: 08/16/2022 10:49:17 AM By: Geralyn CorwinHoffman, Brysen Shankman DO Entered  By: Geralyn Corwin on 08/15/2022 14:31:02 Prescription 08/15/2022 -------------------------------------------------------------------------------- [name]. Geralyn Corwin DO Patient Name: Provider: 1986/02/18 [id number] Date of Birth: NPI#: Judie Petit [id number] Sex: DEA #: [phone] Phone #: License #: Eligha Bridegroom [facility] Patient Address: [address] [place], [place] [address] [address] [place], [place] [address] [phone] Allergies [address] DEONDRAE, MCGRAIL ([id number]) [id number] Page 4 of 8 Provider's Orders Dermatology - ICD10: L80.2 - Dermatology Specialist of [facility], see any partner, related to Hidradenitis suppurativa. Hand Signature: Date(s): Electronic Signature(s) Signed: 08/16/2022 10:49:17 AM By: Geralyn Corwin DO Entered By: Geralyn Corwin on 08/15/2022 14:31:02 -------------------------------------------------------------------------------- Problem List Details Patient Name: Date of Service: [name], [name]. 08/15/2022 1:45 PM Medical Record Number: [id number] Patient Account Number: [id number] Date of Birth/Sex: Treating RN: Feb 29, 1986 (37 y.o. Caleb Sours Primary Care Provider: Edwin Dada Other Clinician: Referring Provider: Treating Provider/Extender: [name] [name] in Treatment: 1 Active  Problems ICD-10 Encounter Code Description Active Date MDM Diagnosis T81.31XD Disruption of external operation (surgical) wound, not elsewhere classified, 08/08/2022 No Yes subsequent encounter L02.213 Cutaneous abscess of chest wall 08/08/2022 No Yes L98.498 Non-pressure chronic ulcer of skin of other sites with other specified severity 08/08/2022 No Yes L73.2 Hidradenitis suppurativa 08/08/2022 No Yes E11.622 Type 2 diabetes mellitus with other skin ulcer 08/08/2022 No Yes Inactive Problems Resolved Problems Electronic Signature(s) Signed: 08/16/2022 10:49:17 AM By: Geralyn Corwin DO Entered By: Geralyn Corwin on 08/15/2022 14:20:41 Progress Note Details -------------------------------------------------------------------------------- [name] ([id number]) [id number] Page 5 of 8 Patient Name: Date of Service: [facility] [name], [name] RO Will Bonnet 08/15/2022 1:45 PM Medical Record Number: [id number] Patient Account Number: [id number] Date of Birth/Sex: Treating RN: 1985/08/21 (37 y.o. M) Primary Care Provider: Edwin Dada Other Clinician: Referring Provider: Treating Provider/Extender: Caleb Michaels in Treatment: 1 Subjective Chief Complaint Information obtained from Patient 1/52/23; patient is here predominantly for Caleb surgical wound in the left inframammary area extending into the left lateral chest. Patient also has Caleb small area on the right inframammary area probably related to hidradenitis History of Present Illness (HPI) ADMISSION 08/08/2021 This is Caleb 36 year old man who underwent an extensive hospitalization from 06/23/2022 through 07/13/2022 with Caleb large necrotic area on the left inframammary area extending into the left lateral chest. The patient states this came on fairly rapidly initially with Caleb boil. This was aspirated on 12/5 and he underwent an extensive operative debridement ultimately on 12/8. Cultures showed rare  Prevotella and moderate actinomyces. He initially was treated with daptomycin and Unasyn in the hospital then Augmentin and more recently he is continued on amoxicillin. [name] with Dr. Algis Liming in Caleb few days. During the course of the hospitalization he was discovered to be Caleb new onset diabetic with Caleb hemoglobin A1c of 11.6. Since his discharge from the hospital he has been followed using Caleb wound VAC on the surgical area. There is an impressive postoperative picture in epic sewing Caleb large open surgical wound. There is been considerable improvement with the wound VAC being changed by home health. I cannot see the notes from either infectious disease or surgery Past medical history includes obesity, new onset diabetes as mentioned. He was Caleb smoker. 1/22; patient presents for follow-up. He has been using silver alginate 3 times weekly. He has some skin irritation to the left sided periwound. He had follow-up with infectious disease, Dr. Algis Liming on 1/17. He is currently taking amoxicillin and  plan is to continue this for the next 5 months. Patient History Family History Cancer - Maternal Grandparents,Paternal Grandparents, Diabetes - Mother. Social History Current every day smoker - 1 ppd, Alcohol Use - Moderate. Medical History Respiratory Patient has history of Asthma Endocrine Patient has history of Type II Diabetes Hospitalization/Surgery History - 12/8 and12/10 IandD left breast abscess. Medical Caleb Surgical History Notes nd Constitutional Symptoms (General Health) breast abscess Psychiatric anxiety Objective Constitutional respirations regular, non-labored and within target range for patient.. Vitals Time Taken: 1:51 AM, Height: 69 in, Weight: 235 lbs, BMI: 34.7, Temperature: 97.8 F, Pulse: 116 bpm, Respiratory Rate: 20 breaths/min, Blood Pressure: 157/97 mmHg, Capillary Blood Glucose: 129 mg/dl. Psychiatric pleasant and cooperative. General Notes: Narrowed wound to the  left inframamillary area extending into the left lateral chest wall. This has granulation tissue throughout. No tunneling or undermining. T the periwound there is skin breakdown with irritation. No surrounding signs of infection. T the right side under the breast there is Caleb small open o o area consistent with hidradenitis that tunnels at the 1 o'clock position. Integumentary (Hair, Skin) Wound #1 status is Open. Original cause of wound was Surgical Injury. The date acquired was: 07/03/2022. The wound has been in treatment 1 [name]. The wound is located on the Left Chest. The wound measures 1cm length x 15cm width x 0.1cm depth; 11.781cm^2 area and 1.178cm^3 volume. There is Fat Layer (Subcutaneous Tissue) exposed. There is no tunneling or undermining noted. There is Caleb medium amount of serosanguineous drainage noted. The wound margin is distinct with the outline attached to the wound base. There is large (67-100%) red, pink granulation within the wound bed. There is no necrotic tissue within the wound bed. The periwound skin appearance did not exhibit: Callus, Crepitus, Excoriation, Induration, Rash, Scarring, Dry/Scaly, Maceration, Atrophie Blanche, Cyanosis, Ecchymosis, Hemosiderin Staining, Mottled, Pallor, Rubor, Erythema. [name], [name] ([id number]) [id number] Page 6 of 8 Wound #2 status is Open. Original cause of wound was Gradually Appeared. The date acquired was: 08/08/2022. The wound has been in treatment 1 [name]. The wound is located on the Right,Midline Chest. The wound measures 0.5cm length x 0.5cm width x 0.3cm depth; 0.196cm^2 area and 0.059cm^3 volume. There is Fat Layer (Subcutaneous Tissue) exposed. There is no tunneling or undermining noted. There is Caleb medium amount of serosanguineous drainage noted. The wound margin is distinct with the outline attached to the wound base. There is large (67-100%) red, pink, hyper - granulation within the wound bed. There is  no necrotic tissue within the wound bed. The periwound skin appearance did not exhibit: Callus, Crepitus, Excoriation, Induration, Rash, Scarring, Dry/Scaly, Maceration, Atrophie Blanche, Cyanosis, Ecchymosis, Hemosiderin Staining, Mottled, Pallor, Rubor, Erythema. Assessment Active Problems ICD-10 Disruption of external operation (surgical) wound, not elsewhere classified, subsequent encounter Cutaneous abscess of chest wall Non-pressure chronic ulcer of skin of other sites with other specified severity Hidradenitis suppurativa Type 2 diabetes mellitus with other skin ulcer Patient's left sided surgical wound appears well-healing. He is having skin breakdown to the periwound and it appears to be from infrequent dressing changes. I recommended daily dressing changes with Vashe wet-to-dry. T the right side under the breast, the open area still tunnels. Also recommended Vashe wet-to- o dry packing. We will refer to dermatology as the right side and areas to the left axilla are consistent with hidradenitis. Plan Follow-up Appointments: Return Appointment in 1 week. - Dr. Heber [place] and [facility] 08/22/2022 130pm Return Appointment in 2 [name]. - Dr. Heber [place] and Tammi Klippel 130pm 08/29/2022  Other: - Infectious disease follow up 08/10/2022. Anesthetic: (In clinic) Topical Lidocaine 4% applied to wound bed Bathing/ Shower/ Hygiene: May shower and wash wound with soap and water. Negative Presssure Wound Therapy: Discontinue wound vac. Call the number on the machine for the company to pick up. Home Health: New wound care orders this week; continue Home Health for wound care. May utilize formulary equivalent dressing for wound treatment orders unless otherwise specified. - x2 week wound care changes. wound center weekly. change to vashe wet to dry dressings to all wounds. Other Home Health Orders/Instructions: - [facility] home [facility] Consults ordered were: Dermatology - D[facility]ology Specialist of [facility], see  any partner, related to Hidradenitis suppurativa. WOUND #1: - Chest Wound Laterality: Left Cleanser: Soap and Water 3 x Per Week/30 Days Discharge Instructions: May shower and wash wound with dial antibacterial soap and water prior to dressing change. Cleanser: Wound Cleanser (Home Health) 3 x Per Week/30 Days Discharge Instructions: Cleanse the wound with wound cleanser prior to applying Caleb clean dressing using gauze sponges, not tissue or cotton balls. Peri-Wound Care: Skin Prep (Home Health) 3 x Per Week/30 Days Discharge Instructions: Use skin prep as directed Peri-Wound Care: Zinc Oxide Ointment 30g tube (Home Health) 3 x Per Week/30 Days Discharge Instructions: Apply Zinc Oxide to periwound with each dressing change due to skin irritation Prim Dressing: vashe wet to dry (Home Health) 3 x Per Week/30 Days ary Discharge Instructions: apply soaked vashe gauze directly to wound bed. Secondary Dressing: ABD Pad, 8x10 (Home Health) 3 x Per Week/30 Days Discharge Instructions: Apply over primary dressing as directed. WOUND #2: - Chest Wound Laterality: Right, Midline Cleanser: Soap and Water 3 x Per Week/30 Days Discharge Instructions: May shower and wash wound with dial antibacterial soap and water prior to dressing change. Cleanser: Wound Cleanser (Home Health) 3 x Per Week/30 Days Discharge Instructions: Cleanse the wound with wound cleanser prior to applying Caleb clean dressing using gauze sponges, not tissue or cotton balls. Peri-Wound Care: Skin Prep (Home Health) 3 x Per Week/30 Days Discharge Instructions: Use skin prep as directed Peri-Wound Care: Zinc Oxide Ointment 30g tube (Home Health) 3 x Per Week/30 Days Discharge Instructions: Apply Zinc Oxide to periwound with each dressing change due to skin irritation Prim Dressing: vashe wet to dry (Home Health) 3 x Per Week/30 Days ary Discharge Instructions: apply soaked vashe gauze directly to wound bed. Secondary Dressing: ABD Pad, 8x10  (Home Health) 3 x Per Week/30 Days Discharge Instructions: Apply over primary dressing as directed. 1. Vashe wet-to-dry dressings 2. Referral to dermatology for hidradenitis management 3. Follow-up in 1 week Electronic Signature(s) Signed: 08/16/2022 10:49:17 AM By: Geralyn Corwin DO Entered By: Geralyn Corwin on 08/15/2022 14:34:39 [name] ([id number]) [id number] Page 7 of 8 -------------------------------------------------------------------------------- HxROS Details Patient Name: Date of Service: [name], [name]. 08/15/2022 1:45 PM Medical Record Number: [id number] Patient Account Number: [id number] Date of Birth/Sex: Treating RN: 02-Feb-1986 (37 y.o. M) Primary Care Provider: Edwin Dada Other Clinician: Referring Provider: Treating Provider/Extender: [name] [name] in Treatment: 1 Constitutional Symptoms (General Health) Medical History: Past Medical History Notes: breast abscess Respiratory Medical History: Positive for: Asthma Endocrine Medical History: Positive for: Type II Diabetes Treated with: Insulin Psychiatric Medical History: Past Medical History Notes: anxiety Immunizations Implantable Devices No devices added Hospitalization / Surgery History Type of Hospitalization/Surgery 12/8 and12/10 IandD left breast abscess Family and Social History Cancer: Yes - Maternal Grandparents,Paternal Grandparents; Diabetes: Yes - Mother; Current every  day smoker - 1 ppd; Alcohol Use: Moderate Electronic Signature(s) Signed: 08/16/2022 10:49:17 AM By: Geralyn Corwin DO Entered By: Geralyn Corwin on 08/15/2022 14:27:54 -------------------------------------------------------------------------------- SuperBill Details Patient Name: Date of Service: [name], [name]. 08/15/2022 Medical Record Number: [id number] Patient Account Number: [id number] Date of Birth/Sex: Treating RN: 1986/05/01 (37 y.o. Caleb Sours Primary Care Provider: Edwin Dada Other Clinician: Referring Provider: Treating Provider/Extender: Caleb Michaels in Treatment: 1 [name], [name] ([id number]) [id number] Page 8 of 8 Diagnosis Coding ICD-10 Codes Code Description T81.31XD Disruption of external operation (surgical) wound, not elsewhere classified, subsequent encounter L02.213 Cutaneous abscess of chest wall L98.498 Non-pressure chronic ulcer of skin of other sites with other specified severity L73.2 Hidradenitis suppurativa E11.622 Type 2 diabetes mellitus with other skin ulcer Facility Procedures : 7 CPT4 Code: [id number] Description: 99214 - WOUND CARE VISIT-LEV 4 EST PT Modifier: Quantity: 1 Physician Procedures : CPT4 Code Description Modifier [id number] - WC PHYS LEVEL 3 - EST PT ICD-10 Diagnosis Description T81.31XD Disruption of external operation (surgical) wound, not elsewhere classified, subsequent encounter L98.498 Non-pressure chronic ulcer of skin  of other sites with other specified severity L73.2 Hidradenitis suppurativa E11.622 Type 2 diabetes mellitus with other skin ulcer Quantity: 1 Electronic Signature(s) Signed: 08/16/2022 10:49:17 AM By: Geralyn Corwin DO Entered By: Geralyn Corwin on 08/15/2022 14:35:25

## 2022-08-17 NOTE — Progress Notes (Signed)
[name], [name] ([id number]) [id number] Page 1 of 11 Visit Report for 08/15/2022 Arrival Information Details Patient Name: Date of Service: [name] [name], [name] 08/15/2022 1:45 PM Medical Record Number: [id number] Patient Account Number: [id number] Date of Birth/Sex: Treating RN: 11-May-1986 (37 y.o. M) Primary Care [name]: Vena Rua Other Clinician: Referring [name]: Treating [name]/Extender: [name] in Treatment: 1 Visit Information History Since Last Visit All ordered tests and consults were completed: No Patient Arrived: Ambulatory Added or deleted any medications: No Arrival Time: 13:50 Any new allergies or adverse reactions: No Accompanied By: self Had a fall or experienced change in No Transfer Assistance: None activities of daily living that may affect Patient Identification Verified: Yes risk of falls: Secondary Verification Process Completed: Yes Signs or symptoms of abuse/neglect since last visito No Patient Requires Transmission-Based Precautions: No Hospitalized since last visit: No Patient Has Alerts: No Implantable device outside of the clinic excluding No cellular tissue based products placed in the center since last visit: Pain Present Now: No Electronic Signature(s) Signed: 08/15/2022 3:48:42 PM By: Worthy [name] Entered By: Worthy [name] on 08/15/2022 13:51:00 -------------------------------------------------------------------------------- Clinic Level of Care Assessment Details Patient Name: Date of Service: [name] [name], [name] 08/15/2022 1:45 PM Medical Record Number: [id number] Patient Account Number: [id number] Date of Birth/Sex: Treating RN: [birth year] (37 y.o. [name] Primary Care [name]: Vena Rua Other Clinician: Referring [name]: Treating [name]/Extender: [name] in Treatment: 1 Clinic Level of Care Assessment Items TOOL 4  Quantity Score X- 1 0 Use when only an EandM is performed on FOLLOW-UP visit ASSESSMENTS - Nursing Assessment / Reassessment X- 1 10 Reassessment of Co-morbidities (includes updates in patient status) X- 1 5 Reassessment of Adherence to Treatment Plan ASSESSMENTS - Wound and Skin A ssessment / Reassessment []  - 0 Simple Wound Assessment / Reassessment - one wound X- 2 5 Complex Wound Assessment / Reassessment - multiple wounds X- 1 10 Dermatologic / Skin Assessment (not related to wound area) ASSESSMENTS - Focused Assessment []  - 0 Circumferential Edema Measurements - multi extremities []  - 0 Nutritional Assessment / Counseling / Intervention RITA, PROM ([id number]) [id number] Page 2 of 11 []  - 0 Lower Extremity Assessment (monofilament, tuning fork, pulses) []  - 0 Peripheral Arterial Disease Assessment (using hand held doppler) ASSESSMENTS - Ostomy and/or Continence Assessment and Care []  - 0 Incontinence Assessment and Management []  - 0 Ostomy Care Assessment and Management (repouching, etc.) PROCESS - Coordination of Care []  - 0 Simple Patient / Family Education for ongoing care X- 1 20 Complex (extensive) Patient / Family Education for ongoing care X- 1 10 Staff obtains Programmer, systems, Records, T Results / Process Orders est X- 1 10 Staff telephones HHA, Nursing Homes / Clarify orders / etc []  - 0 Routine Transfer to another Facility (non-emergent condition) []  - 0 Routine [name] Admission (non-emergent condition) []  - 0 New Admissions / Biomedical engineer / Ordering NPWT Apligraf, etc. , []  - 0 Emergency [name] Admission (emergent condition) []  - 0 Simple Discharge Coordination X- 1 15 Complex (extensive) Discharge Coordination PROCESS - Special Needs []  - 0 Pediatric / Minor Patient Management []  - 0 Isolation Patient Management []  - 0 Hearing / Language / Visual special needs []  - 0 Assessment of Community  assistance (transportation, D/[name] planning, etc.) []  - 0 Additional assistance / Altered mentation []  - 0 Support Surface(s) Assessment (bed, cushion, seat, etc.) INTERVENTIONS - Wound Cleansing / Measurement []  - 0 Simple  Wound Cleansing - one wound X- 2 5 Complex Wound Cleansing - multiple wounds X- 1 5 Wound Imaging (photographs - any number of wounds) []  - 0 Wound Tracing (instead of photographs) []  - 0 Simple Wound Measurement - one wound X- 2 5 Complex Wound Measurement - multiple wounds INTERVENTIONS - Wound Dressings X - Small Wound Dressing one or multiple wounds 1 10 X- 1 15 Medium Wound Dressing one or multiple wounds []  - 0 Large Wound Dressing one or multiple wounds []  - 0 Application of Medications - topical []  - 0 Application of Medications - injection INTERVENTIONS - Miscellaneous []  - 0 External ear exam []  - 0 Specimen Collection (cultures, biopsies, blood, body fluids, etc.) []  - 0 Specimen(s) / Culture(s) sent or taken to Lab for analysis []  - 0 Patient Transfer (multiple staff / Civil Service fast streamer / Similar devices) []  - 0 Simple Staple / Suture removal (25 or less) []  - 0 Complex Staple / Suture removal (26 or more) []  - 0 Hypo / Hyperglycemic Management (close monitor of Blood Glucose) [name], [name] ([id number]) [id number] Page 3 of 11 []  - 0 Ankle / Brachial Index (ABI) - do not check if billed separately X- 1 5 Vital Signs Has the patient been seen at the [name] within the last three years: Yes Total Score: 145 Level Of Care: New/Established - Level 4 Electronic Signature(s) Signed: 08/16/2022 6:38:55 PM By: Caleb Pilling RN, BSN Entered By: [name] on 08/15/2022 14:17:08 -------------------------------------------------------------------------------- Encounter Discharge Information Details Patient Name: Date of Service: [name] [name], [name] 08/15/2022 1:45 PM Medical Record Number: [id number] Patient Account  Number: [id number] Date of Birth/Sex: Treating RN: 13-Sep-1985 (37 y.o. [name] Primary Care [name]: Vena Rua Other Clinician: Referring Caleb Conti: Treating Deniel Mcquiston/Extender: [name] in Treatment: 1 Encounter Discharge Information Items Discharge Condition: Stable Ambulatory Status: Ambulatory Discharge Destination: Home Transportation: Private Auto Accompanied By: self Schedule Follow-up Appointment: Yes Clinical Summary of Care: Electronic Signature(s) Signed: 08/16/2022 6:38:55 PM By: Caleb Pilling RN, BSN Entered By: [name] on 08/15/2022 14:17:30 -------------------------------------------------------------------------------- Lower Extremity Assessment Details Patient Name: Date of Service: [facility] [name], [name] 08/15/2022 1:45 PM Medical Record Number: [id number] Patient Account Number: [id number] Date of Birth/Sex: Treating RN: [birth year] (37 y.o. [name] Primary Care Hilda Wexler: Vena Rua Other Clinician: Referring Lashae Wollenberg: Treating Madilyne Tadlock/Extender: Salli Quarry Weeks in Treatment: 1 Electronic Signature(s) Signed: 08/16/2022 6:38:55 PM By: Caleb Pilling RN, BSN Entered By: [name] on 08/15/2022 14:06:07 -------------------------------------------------------------------------------- Multi Wound Chart Details Patient Name: Date of Service: JO [name], [name] 08/15/2022 1:45 PM Medical Record Number: [id number] Patient Account Number: [id number] [name], [name] ([id number]) [phone].pdf Page 4 of 11 Date of Birth/Sex: Treating RN: 1986-05-20 (37 y.o. M) Primary Care Marena Witts: Other Clinician: Vena Rua Referring Jermarcus Mcfadyen: Treating Manjot Hinks/Extender: [name] in Treatment: 1 Vital Signs Height(in): 69 Capillary Blood Glucose(mg/dl): 129 Weight(lbs): 235 Pulse(bpm): 116 Body Mass Index(BMI): 34.7 Blood  Pressure(mmHg): 157/97 Temperature(F): 97.8 Respiratory Rate(breaths/min): 20 [1:Photos:] [N/A:N/A] Left Chest Right, Midline Chest N/A Wound Location: Surgical Injury Gradually Appeared N/A Wounding Event: Open Surgical Wound Hidradenitis N/A Primary Etiology: Asthma, Type II Diabetes Asthma, Type II Diabetes N/A Comorbid History: 07/03/2022 08/08/2022 N/A Date Acquired: 1 1 N/A Weeks of Treatment: Open Open N/A Wound Status: No No N/A Wound Recurrence: 1x15x0.1 0.5x0.5x0.3 N/A Measurements L x W x D (cm) 11.781 0.196 N/A A (cm) : rea 1.178 0.059 N/A Volume (  cm) : 60.00% 0.00% N/A % Reduction in A rea: 60.00% 25.30% N/A % Reduction in Volume: Full Thickness Without Exposed Full Thickness Without Exposed N/A Classification: Support Structures Support Structures Medium Medium N/A Exudate Amount: Serosanguineous Serosanguineous N/A Exudate Type: red, Caleb red, Caleb N/A Exudate Color: Distinct, outline attached Distinct, outline attached N/A Wound Margin: Large (67-100%) Large (67-100%) N/A Granulation Amount: Red, Pink Red, Pink, Hyper-granulation N/A Granulation Quality: None Present (0%) None Present (0%) N/A Necrotic Amount: Fat Layer (Subcutaneous Tissue): Yes Fat Layer (Subcutaneous Tissue): Yes N/A Exposed Structures: Fascia: No Fascia: No Tendon: No Tendon: No Muscle: No Muscle: No Joint: No Joint: No Bone: No Bone: No Medium (34-66%) None N/A Epithelialization: Excoriation: No Excoriation: No N/A Periwound Skin Texture: Induration: No Induration: No Callus: No Callus: No Crepitus: No Crepitus: No Rash: No Rash: No Scarring: No Scarring: No Maceration: No Maceration: No N/A Periwound Skin Moisture: Dry/Scaly: No Dry/Scaly: No Atrophie Blanche: No Atrophie Blanche: No N/A Periwound Skin Color: Cyanosis: No Cyanosis: No Ecchymosis: No Ecchymosis: No Erythema: No Erythema: No Hemosiderin Staining: No Hemosiderin  Staining: No Mottled: No Mottled: No Pallor: No Pallor: No Rubor: No Rubor: No Treatment Notes Wound #1 (Chest) Wound Laterality: Left Cleanser Soap and Water Discharge Instruction: May shower and wash wound with dial antibacterial soap and water prior to dressing change. Wound Cleanser Discharge Instruction: Cleanse the wound with wound cleanser prior to applying a clean dressing using gauze sponges, not tissue or cotton balls. Peri-Wound Care [name], [name] ([id number]) [phone].pdf Page 5 of 11 Skin Prep Discharge Instruction: Use skin prep as directed Zinc Oxide Ointment 30g tube Discharge Instruction: Apply Zinc Oxide to periwound with each dressing change due to skin irritation Topical Primary Dressing vashe wet to dry Discharge Instruction: apply soaked vashe gauze directly to wound bed. Secondary Dressing ABD Pad, 8x10 Discharge Instruction: Apply over primary dressing as directed. Secured With Compression Wrap Compression Stockings Add-Ons Wound #2 (Chest) Wound Laterality: Right, Midline Cleanser Soap and Water Discharge Instruction: May shower and wash wound with dial antibacterial soap and water prior to dressing change. Wound Cleanser Discharge Instruction: Cleanse the wound with wound cleanser prior to applying a clean dressing using gauze sponges, not tissue or cotton balls. Peri-Wound Care Skin Prep Discharge Instruction: Use skin prep as directed Zinc Oxide Ointment 30g tube Discharge Instruction: Apply Zinc Oxide to periwound with each dressing change due to skin irritation Topical Primary Dressing vashe wet to dry Discharge Instruction: apply soaked vashe gauze directly to wound bed. Secondary Dressing ABD Pad, 8x10 Discharge Instruction: Apply over primary dressing as directed. Secured With Compression Wrap Compression Stockings Environmental education officer) Signed: 08/16/2022 10:49:17 AM By: Kalman Shan  DO Entered By: Kalman Shan on 08/15/2022 14:20:49 -------------------------------------------------------------------------------- Multi-Disciplinary Care Plan Details Patient Name: Date of Service: [name] [name], [name] 08/15/2022 1:45 PM Medical Record Number: [id number] Patient Account Number: [id number] Date of Birth/Sex: Treating RN: [birth year] (37 y.o. [name] Primary Care Montrey Buist: Vena Rua Other Clinician: Referring Jaspreet Bodner: Treating Argenis Kumari/Extender: [name] in Treatment: 1 LASHAN, GLUTH ([id number]) [id number] Page 6 of 11 Active Inactive Pain, Acute or Chronic Nursing Diagnoses: Pain, acute or chronic: actual or potential Potential alteration in comfort, pain Goals: Patient will verbalize adequate pain control and receive pain control interventions during procedures as needed Date Initiated: 08/08/2022 Target Resolution Date: 09/02/2022 Goal Status: Active Patient/caregiver will verbalize comfort level met Date Initiated: 08/08/2022 Target Resolution Date: 09/01/2022 Goal Status: Active Interventions: Encourage patient  to take pain medications as prescribed Provide education on pain management Reposition patient for comfort Treatment Activities: Administer pain control measures as ordered : 08/08/2022 Notes: Wound/Skin Impairment Nursing Diagnoses: Knowledge deficit related to ulceration/compromised skin integrity Goals: Patient/caregiver will verbalize understanding of skin care regimen Date Initiated: 08/08/2022 Target Resolution Date: 09/01/2022 Goal Status: Active Interventions: Assess patient/caregiver ability to perform ulcer/skin care regimen upon admission and as needed Assess ulceration(s) every visit Provide education on ulcer and skin care Treatment Activities: Skin care regimen initiated : 08/08/2022 Topical wound management initiated : 08/08/2022 Notes: Electronic  Signature(s) Signed: 08/16/2022 6:38:55 PM By: Caleb Pilling RN, BSN Entered By: [name] on 08/15/2022 14:10:35 -------------------------------------------------------------------------------- Pain Assessment Details Patient Name: Date of Service: JO [name], [name] 08/15/2022 1:45 PM Medical Record Number: [id number] Patient Account Number: [id number] Date of Birth/Sex: Treating RN: [birth year] (37 y.o. M) Primary Care Livier Hendel: Vena Rua Other Clinician: Referring Laneya Gasaway: Treating Cylan Borum/Extender: Salli Quarry Weeks in Treatment: 1 Active Problems Location of Pain Severity and Description of Pain Patient Has Paino No Site Locations [name], [name] ([id number]) [phone].pdf Page 7 of 11 Site Locations Rate the pain. Current Pain Level: 4 Worst Pain Level: 10 Least Pain Level: 0 Tolerable Pain Level: 1 Pain Management and Medication Current Pain Management: Electronic Signature(s) Signed: 08/15/2022 3:48:42 PM By: Worthy [name] Entered By: Worthy [name] on 08/15/2022 13:51:59 -------------------------------------------------------------------------------- Patient/Caregiver Education Details Patient Name: Date of Service: [name] [name], A A RO Haywood Filler 1/22/2024andnbsp1:45 PM Medical Record Number: [id number] Patient Account Number: [id number] Date of Birth/Gender: Treating RN: 08-Apr-1986 (37 y.o. [name] Primary Care Physician: Vena Rua Other Clinician: Referring Physician: Treating Physician/Extender: [name] in Treatment: 1 Education Assessment Education Provided To: Patient Education Topics Provided Wound/Skin Impairment: Handouts: Caring for Your Ulcer Methods: Explain/Verbal Responses: Reinforcements needed Electronic Signature(s) Signed: 08/16/2022 6:38:55 PM By: Caleb Pilling RN, BSN Entered By: [name] on 08/15/2022  14:10:47 -------------------------------------------------------------------------------- Wound Assessment Details Patient Name: Date of Service: JO [name], [name] 08/15/2022 1:45 PM Medical Record Number: [id number] Patient Account Number: [id number] Date of Birth/Sex: Treating RN: July 28, 1985 (37 y.o. Clent Ridges, Buford Dresser ([id number]) [id number] Page 8 of 11 Primary Care Agripina Guyette: Vena Rua Other Clinician: Referring Dason Mosley: Treating Maliah Pyles/Extender: Salli Quarry Weeks in Treatment: 1 Wound Status Wound Number: 1 Primary Etiology: Open Surgical Wound Wound Location: Left Chest Wound Status: Open Wounding Event: Surgical Injury Comorbid History: Asthma, Type II Diabetes Date Acquired: 07/03/2022 Weeks Of Treatment: 1 Clustered Wound: No Photos Wound Measurements Length: (cm) 1 Width: (cm) 15 Depth: (cm) 0.1 Area: (cm) 11.781 Volume: (cm) 1.178 % Reduction in Area: 60% % Reduction in Volume: 60% Epithelialization: Medium (34-66%) Tunneling: No Undermining: No Wound Description Classification: Full Thickness Without Exposed Support Structures Wound Margin: Distinct, outline attached Exudate Amount: Medium Exudate Type: Serosanguineous Exudate Color: red, Caleb Foul Odor After Cleansing: No Slough/Fibrino No Wound Bed Granulation Amount: Large (67-100%) Exposed Structure Granulation Quality: Red, Pink Fascia Exposed: No Necrotic Amount: None Present (0%) Fat Layer (Subcutaneous Tissue) Exposed: Yes Tendon Exposed: No Muscle Exposed: No Joint Exposed: No Bone Exposed: No Periwound Skin Texture Texture Color No Abnormalities Noted: No No Abnormalities Noted: No Callus: No Atrophie Blanche: No Crepitus: No Cyanosis: No Excoriation: No Ecchymosis: No Induration: No Erythema: No Rash: No Hemosiderin Staining: No Scarring: No Mottled: No Pallor: No Moisture Rubor: No No Abnormalities Noted:  No Dry / Scaly: No Maceration: No Treatment Notes Wound #1 (Chest)  Wound Laterality: Left Cleanser Soap and Water Discharge Instruction: May shower and wash wound with dial antibacterial soap and water prior to dressing change. Wound Cleanser Discharge Instruction: Cleanse the wound with wound cleanser prior to applying a clean dressing using gauze sponges, not tissue or cotton balls. KIMONI, CHABAN ([id number]) [id number] Page 9 of 11 Peri-Wound Care Skin Prep Discharge Instruction: Use skin prep as directed Zinc Oxide Ointment 30g tube Discharge Instruction: Apply Zinc Oxide to periwound with each dressing change due to skin irritation Topical Primary Dressing vashe wet to dry Discharge Instruction: apply soaked vashe gauze directly to wound bed. Secondary Dressing ABD Pad, 8x10 Discharge Instruction: Apply over primary dressing as directed. Secured With Compression Wrap Compression Stockings Environmental education officer) Signed: 08/16/2022 6:38:55 PM By: Caleb Pilling RN, BSN Entered By: [name] on 08/15/2022 14:06:17 -------------------------------------------------------------------------------- Wound Assessment Details Patient Name: Date of Service: JO [name], [name] 08/15/2022 1:45 PM Medical Record Number: [id number] Patient Account Number: [id number] Date of Birth/Sex: Treating RN: 1986-01-19 (37 y.o. M) Primary Care Lonn Im: Vena Rua Other Clinician: Referring Saidee Geremia: Treating Emrys Mceachron/Extender: Salli Quarry Weeks in Treatment: 1 Wound Status Wound Number: 2 Primary Etiology: Hidradenitis Wound Location: Right, Midline Chest Wound Status: Open Wounding Event: Gradually Appeared Comorbid History: Asthma, Type II Diabetes Date Acquired: 08/08/2022 Weeks Of Treatment: 1 Clustered Wound: No Photos Wound Measurements Length: (cm) 0.5 Width: (cm) 0.5 Depth: (cm) 0.3 Area: (cm) 0.196 Volume:  (cm) 0.059 % Reduction in Area: 0% % Reduction in Volume: 25.3% Epithelialization: None Tunneling: No Undermining: No Wound Description [name], [name] ([id number]) Classification: Full Thickness Without Exposed Support Structures Wound Margin: Distinct, outline attached Exudate Amount: Medium Exudate Type: Serosanguineous Exudate Color: red, Caleb [id number] Page 10 of 11 Foul Odor After Cleansing: No Slough/Fibrino No Wound Bed Granulation Amount: Large (67-100%) Exposed Structure Granulation Quality: Red, Pink, Hyper-granulation Fascia Exposed: No Necrotic Amount: None Present (0%) Fat Layer (Subcutaneous Tissue) Exposed: Yes Tendon Exposed: No Muscle Exposed: No Joint Exposed: No Bone Exposed: No Periwound Skin Texture Texture Color No Abnormalities Noted: No No Abnormalities Noted: No Callus: No Atrophie Blanche: No Crepitus: No Cyanosis: No Excoriation: No Ecchymosis: No Induration: No Erythema: No Rash: No Hemosiderin Staining: No Scarring: No Mottled: No Pallor: No Moisture Rubor: No No Abnormalities Noted: No Dry / Scaly: No Maceration: No Treatment Notes Wound #2 (Chest) Wound Laterality: Right, Midline Cleanser Soap and Water Discharge Instruction: May shower and wash wound with dial antibacterial soap and water prior to dressing change. Wound Cleanser Discharge Instruction: Cleanse the wound with wound cleanser prior to applying a clean dressing using gauze sponges, not tissue or cotton balls. Peri-Wound Care Skin Prep Discharge Instruction: Use skin prep as directed Zinc Oxide Ointment 30g tube Discharge Instruction: Apply Zinc Oxide to periwound with each dressing change due to skin irritation Topical Primary Dressing vashe wet to dry Discharge Instruction: apply soaked vashe gauze directly to wound bed. Secondary Dressing ABD Pad, 8x10 Discharge Instruction: Apply over primary dressing as directed. Secured  With Compression Wrap Compression Stockings Environmental education officer) Signed: 08/16/2022 6:38:55 PM By: Caleb Pilling RN, BSN Entered By: [name] on 08/15/2022 14:06:25 [name] ([id number] Page 11 of 11 -------------------------------------------------------------------------------- Vitals Details Patient Name: Date of Service: JO [name], [name] 08/15/2022 1:45 PM Medical Record Number: [id number] Patient Account Number: [id number] Date of Birth/Sex: Treating RN: 1985/10/19 (37 y.o. M) Primary Care Cortne Amara: Vena Rua Other Clinician: Referring Lazariah Savard: Treating Ajani Rineer/Extender: Kalman Shan  Edwin Dada Weeks in Treatment: 1 Vital Signs Time Taken: 01:51 Temperature (F): 97.8 Height (in): 69 Pulse (bpm): 116 Weight (lbs): 235 Respiratory Rate (breaths/min): 20 Body Mass Index (BMI): 34.7 Blood Pressure (mmHg): 157/97 Capillary Blood Glucose (mg/dl): 211 Reference Range: 80 - 120 mg / dl Electronic Signature(s) Signed: 08/15/2022 3:48:42 PM By: Dayton Scrape Entered By: Dayton Scrape on 08/15/2022 13:51:38

## 2022-08-22 ENCOUNTER — Encounter (HOSPITAL_BASED_OUTPATIENT_CLINIC_OR_DEPARTMENT_OTHER): Payer: No Typology Code available for payment source | Admitting: Internal Medicine

## 2022-08-22 DIAGNOSIS — E11622 Type 2 diabetes mellitus with other skin ulcer: Secondary | ICD-10-CM

## 2022-08-22 DIAGNOSIS — L98498 Non-pressure chronic ulcer of skin of other sites with other specified severity: Secondary | ICD-10-CM

## 2022-08-22 DIAGNOSIS — L732 Hidradenitis suppurativa: Secondary | ICD-10-CM

## 2022-08-22 NOTE — Progress Notes (Signed)
[place], [place] ([id number]) [phone].pdf Page 1 of 9 Visit Report for 08/22/2022 Arrival Information Details Patient Name: Date of Service: [facility] [name], [name]. 08/22/2022 1:30 PM Medical Record Number: [id number] Patient Account Number: [id number] Date of Birth/Sex: Treating RN: [birth year] (37 y.o. M) Primary Care Fahim Kats: Vena Rua Other Clinician: Referring Jumanah Hynson: Treating Deadrian Toya/Extender: Jhonnie Garner in [name]: 2 Visit Information History Since Last Visit Added or deleted any medications: No Patient Arrived: Ambulatory Any new allergies or adverse reactions: No Arrival Time: 13:51 Had Caleb fall or experienced change in No Accompanied By: self activities of daily living that may affect Transfer Assistance: None risk of falls: Patient Identification Verified: Yes Signs or symptoms of abuse/neglect since last visito No Secondary Verification Process Completed: Yes Hospitalized since last visit: No Patient Requires Transmission-Based Precautions: No Implantable device outside of the clinic excluding No Patient Has Alerts: No cellular tissue based products placed in the center since last visit: Has Dressing in Place as Prescribed: Yes Pain Present Now: No Electronic Signature(s) Signed: 08/22/2022 4:38:40 PM By: Erenest Blank Entered By: Erenest Blank on 08/22/2022 13:52:48 -------------------------------------------------------------------------------- Encounter Discharge Information Details Patient Name: Date of Service: [name], [name]. 08/22/2022 1:30 PM Medical Record Number: [id number] Patient Account Number: [id number] Date of Birth/Sex: Treating RN: September 06, 1985 (37 y.o. [name] Primary Care Hasna Stefanik: Vena Rua Other Clinician: Referring Stephen Turnbaugh: Treating Korby Ratay/Extender: Jhonnie Garner in [name]: 2 Encounter Discharge Information Items Discharge  Condition: Stable Ambulatory Status: Ambulatory Discharge Destination: Home Transportation: Private Auto Accompanied By: self Schedule Follow-up Appointment: Yes Clinical Summary of Care: Electronic Signature(s) Signed: 08/22/2022 5:48:48 PM By: Deon Pilling RN, BSN Entered By: Deon Pilling on 08/22/2022 14:30:21 [name] ([id number]) [id number] Page 2 of 9 -------------------------------------------------------------------------------- Lower Extremity Assessment Details Patient Name: Date of Service: [name], [name]. 08/22/2022 1:30 PM Medical Record Number: [id number] Patient Account Number: [id number] Date of Birth/Sex: Treating RN: 1985-10-26 (37 y.o. [name] Primary Care Vane Yapp: Vena Rua Other Clinician: Referring Teo Moede: Treating Jovante Hammitt/Extender: [name]: 2 Electronic Signature(s) Signed: 08/22/2022 5:48:48 PM By: Deon Pilling RN, BSN Entered By: Deon Pilling on 08/22/2022 13:55:20 -------------------------------------------------------------------------------- Multi Wound Chart Details Patient Name: Date of Service: [name], [name]. 08/22/2022 1:30 PM Medical Record Number: [id number] Patient Account Number: [id number] Date of Birth/Sex: Treating RN: 07-28-1985 (37 y.o. M) Primary Care Chaela Branscum: Vena Rua Other Clinician: Referring Jermy Couper: Treating Faithanne Verret/Extender: [name]: 2 Vital Signs Height(in): 69 Capillary Blood Glucose(mg/dl): 136 Weight(lbs): 235 Pulse(bpm): 111 Body Mass Index(BMI): 34.7 Blood Pressure(mmHg): 150/92 Temperature(F): 98.5 Respiratory Rate(breaths/min): 18 [1:Photos:] [N/Caleb:N/Caleb] Left Chest Right, Midline Chest N/Caleb Wound Location: Surgical Injury Gradually Appeared N/Caleb Wounding Event: Open Surgical Wound Hidradenitis N/Caleb Primary Etiology: Asthma, Type II Diabetes Asthma, Type  II Diabetes N/Caleb Comorbid History: 07/03/2022 08/08/2022 N/Caleb Date Acquired: 2 2 N/Caleb Weeks of [name]: Open Open N/Caleb Wound Status: No No N/Caleb Wound Recurrence: 1x17.4x0.1 0.5x0.5x0.9 N/Caleb Measurements [name] x W x D (cm) 13.666 0.196 N/[name] (cm) : rea 1.367 0.177 N/Caleb Volume (cm) : 53.60% 0.00% N/Caleb % Reduction in Caleb rea: 53.60% -124.10% N/Caleb % Reduction in Volume: Full Thickness Without Exposed Full Thickness Without Exposed N/Caleb Classification: Support Structures Support Structures Large Medium N/Caleb Exudate Amount: Serosanguineous Purulent N/Caleb Exudate Type: red, brown yellow, brown, green N/Caleb Exudate Color: Distinct, outline attached Distinct, outline attached N/Caleb Wound Margin: Large (  67-100%) Large (67-100%) N/Caleb Granulation Amount: Red, Pink, Hyper-granulation Red, Pink, Hyper-granulation N/Caleb Granulation Quality: None Present (0%) None Present (0%) N/Caleb Necrotic Amount: [name], [name] ([id number]) [phone].pdf Page 3 of 9 Fat Layer (Subcutaneous Tissue): Yes Fat Layer (Subcutaneous Tissue): Yes N/Caleb Exposed Structures: Fascia: No Fascia: No Tendon: No Tendon: No Muscle: No Muscle: No Joint: No Joint: No Bone: No Bone: No Medium (34-66%) None N/Caleb Epithelialization: Excoriation: No Excoriation: No N/Caleb Periwound Skin Texture: Induration: No Induration: No Callus: No Callus: No Crepitus: No Crepitus: No Rash: No Rash: No Scarring: No Scarring: No Maceration: No Maceration: No N/Caleb Periwound Skin Moisture: Dry/Scaly: No Dry/Scaly: No Atrophie Blanche: No Atrophie Blanche: No N/Caleb Periwound Skin Color: Cyanosis: No Cyanosis: No Ecchymosis: No Ecchymosis: No Erythema: No Erythema: No Hemosiderin Staining: No Hemosiderin Staining: No Mottled: No Mottled: No Pallor: No Pallor: No Rubor: No Rubor: No Chemical Cauterization N/Caleb N/Caleb Procedures Performed: [name] Notes Wound #1 (Chest) Wound Laterality: Left Cleanser Soap and  Water Discharge Instruction: May shower and wash wound with dial antibacterial soap and water prior to dressing change. Wound Cleanser Discharge Instruction: Cleanse the wound with wound cleanser prior to applying Caleb clean dressing using gauze sponges, not tissue or cotton balls. Peri-Wound Care Skin Prep Discharge Instruction: Use skin prep as directed Zinc Oxide Ointment 30g tube Discharge Instruction: Apply Zinc Oxide to periwound with each dressing change due to skin irritation Topical Primary Dressing vashe wet to dry Discharge Instruction: apply soaked vashe gauze directly to wound bed. Secondary Dressing ABD Pad, 8x10 Discharge Instruction: Apply over primary dressing as directed. Secured With Compression Wrap Compression Stockings Add-Ons Wound #2 (Chest) Wound Laterality: Right, Midline Cleanser Soap and Water Discharge Instruction: May shower and wash wound with dial antibacterial soap and water prior to dressing change. Wound Cleanser Discharge Instruction: Cleanse the wound with wound cleanser prior to applying Caleb clean dressing using gauze sponges, not tissue or cotton balls. Peri-Wound Care Skin Prep Discharge Instruction: Use skin prep as directed Zinc Oxide Ointment 30g tube Discharge Instruction: Apply Zinc Oxide to periwound with each dressing change due to skin irritation Topical Primary Dressing vashe wet to dry Discharge Instruction: apply soaked vashe gauze directly to wound bed. [name], [name] ([id number]) [phone].pdf Page 4 of 9 Secondary Dressing ABD Pad, 8x10 Discharge Instruction: Apply over primary dressing as directed. Secured With Compression Wrap Compression Stockings Facilities manager) Signed: 08/22/2022 3:52:58 PM By: Geralyn Corwin DO Entered By: Geralyn Corwin on 08/22/2022 14:35:11 -------------------------------------------------------------------------------- Multi-Disciplinary Care Plan  Details Patient Name: Date of Service: [facility] [name], [name]. 08/22/2022 1:30 PM Medical Record Number: [id number] Patient Account Number: [id number] Date of Birth/Sex: Treating RN: 01/16/1986 (37 y.o. [name] Primary Care Roniesha Hollingshead: Edwin Dada Other Clinician: Referring Ednah Hammock: Treating Treyce Spillers/Extender: Jhonnie Garner Weeks in [name]: 2 Active Inactive Pain, Acute or Chronic Nursing Diagnoses: Pain, acute or chronic: actual or potential Potential alteration in comfort, pain Goals: Patient will verbalize adequate pain control and receive pain control interventions during procedures as needed Date Initiated: 08/08/2022 Target Resolution Date: 09/02/2022 Goal Status: Active Patient/caregiver will verbalize comfort level met Date Initiated: 08/08/2022 Target Resolution Date: 09/01/2022 Goal Status: Active Interventions: Encourage patient to take pain medications as prescribed Provide education on pain management Reposition patient for comfort [name] Activities: Administer pain control measures as ordered : 08/08/2022 Notes: Wound/Skin Impairment Nursing Diagnoses: Knowledge deficit related to ulceration/compromised skin integrity Goals: Patient/caregiver will verbalize understanding of skin care regimen Date Initiated: 08/08/2022 Target Resolution  Date: 09/01/2022 Goal Status: Active Interventions: Assess patient/caregiver ability to perform ulcer/skin care regimen upon admission and as needed Assess ulceration(s) every visit Provide education on ulcer and skin care [name], [name] ([id number]) [phone].pdf Page 5 of 9 [name] Activities: Skin care regimen initiated : 08/08/2022 Topical wound management initiated : 08/08/2022 Notes: Electronic Signature(s) Signed: 08/22/2022 5:48:48 PM By: Deon Pilling RN, BSN Entered By: Deon Pilling on 08/22/2022  13:58:04 -------------------------------------------------------------------------------- Pain Assessment Details Patient Name: Date of Service: [name], [name]. 08/22/2022 1:30 PM Medical Record Number: [id number] Patient Account Number: [id number] Date of Birth/Sex: Treating RN: 01/04/1986 (36 y.o. [name] Primary Care Senaida Chilcote: Vena Rua Other Clinician: Referring Camauri Craton: Treating Hinton Luellen/Extender: [name]: 2 Active Problems Location of Pain Severity and Description of Pain Patient Has Paino No Site Locations Rate the pain. Current Pain Level: 0 Pain Management and Medication Current Pain Management: Medication: No Cold Application: No Rest: No Massage: No Activity: No T.E.N.S.: No Heat Application: No Leg drop or elevation: No Is the Current Pain Management Adequate: Adequate How does your wound impact your activities of daily livingo Sleep: No Bathing: No Appetite: No Relationship With Others: No Bladder Continence: No Emotions: No Bowel Continence: No Work: No Toileting: No Drive: No Dressing: No Hobbies: No Electronic Signature(s) Signed: 08/22/2022 5:48:48 PM By: Deon Pilling RN, BSN Entered By: Deon Pilling on 08/22/2022 13:55:14 [name] ([id number]) [id number] Page 6 of 9 -------------------------------------------------------------------------------- Patient/Caregiver Education Details Patient Name: Date of Service: [name], [name] 1/29/2024andnbsp1:30 PM Medical Record Number: [id number] Patient Account Number: [id number] Date of Birth/Gender: Treating RN: [birth year] (37 y.o. [name] Primary Care Physician: Vena Rua Other Clinician: Referring Physician: Treating Physician/Extender: Jhonnie Garner in [name]: 2 Education Assessment Education Provided To: Patient Education Topics Provided Wound/Skin  Impairment: Handouts: Caring for Your Ulcer Methods: Explain/Verbal Responses: Reinforcements needed Electronic Signature(s) Signed: 08/22/2022 5:48:48 PM By: Deon Pilling RN, BSN Entered By: Deon Pilling on 08/22/2022 13:58:17 -------------------------------------------------------------------------------- Wound Assessment Details Patient Name: Date of Service: [name], [name]. 08/22/2022 1:30 PM Medical Record Number: [id number] Patient Account Number: [id number] Date of Birth/Sex: Treating RN: 09-18-1985 (37 y.o. [name] Primary Care Jaceon Heiberger: Vena Rua Other Clinician: Referring Jeri Jeanbaptiste: Treating Patryk Conant/Extender: [name]: 2 Wound Status Wound Number: 1 Primary Etiology: Open Surgical Wound Wound Location: Left Chest Wound Status: Open Wounding Event: Surgical Injury Comorbid History: Asthma, Type II Diabetes Date Acquired: 07/03/2022 Weeks Of [name]: 2 Clustered Wound: No Photos Wound Measurements Length: (cm) 1 [place], [name] ([id number]) Width: (cm) 17.4 Depth: (cm) 0.1 Area: (cm) 13. Volume: (cm) 1.3 % Reduction in Area: 53.6% [id number] Page 7 of 9 % Reduction in Volume: 53.6% Epithelialization: Medium (34-66%) 666 Tunneling: No 67 Undermining: No Wound Description Classification: Full Thickness Without Exposed Support Structures Wound Margin: Distinct, outline attached Exudate Amount: Large Exudate Type: Serosanguineous Exudate Color: red, brown Foul Odor After Cleansing: No Slough/Fibrino No Wound Bed Granulation Amount: Large (67-100%) Exposed Structure Granulation Quality: Red, Pink, Hyper-granulation Fascia Exposed: No Necrotic Amount: None Present (0%) Fat Layer (Subcutaneous Tissue) Exposed: Yes Tendon Exposed: No Muscle Exposed: No Joint Exposed: No Bone Exposed: No Periwound Skin Texture Texture Color No Abnormalities Noted: No No  Abnormalities Noted: No Callus: No Atrophie Blanche: No Crepitus: No Cyanosis: No Excoriation: No Ecchymosis: No Induration: No Erythema: No Rash: No Hemosiderin Staining: No Scarring: No Mottled: No Pallor:  No Moisture Rubor: No No Abnormalities Noted: No Dry / Scaly: No Maceration: No [name] Notes Wound #1 (Chest) Wound Laterality: Left Cleanser Soap and Water Discharge Instruction: May shower and wash wound with dial antibacterial soap and water prior to dressing change. Wound Cleanser Discharge Instruction: Cleanse the wound with wound cleanser prior to applying Caleb clean dressing using gauze sponges, not tissue or cotton balls. Peri-Wound Care Skin Prep Discharge Instruction: Use skin prep as directed Zinc Oxide Ointment 30g tube Discharge Instruction: Apply Zinc Oxide to periwound with each dressing change due to skin irritation Topical Primary Dressing vashe wet to dry Discharge Instruction: apply soaked vashe gauze directly to wound bed. Secondary Dressing ABD Pad, 8x10 Discharge Instruction: Apply over primary dressing as directed. Secured With Compression Wrap Compression Stockings Environmental education officer) Signed: 08/22/2022 4:38:40 PM By: Erenest Blank Signed: 08/22/2022 5:48:48 PM By: Deon Pilling RN, BSN Entered By: Erenest Blank on 08/22/2022 14:02:17 [name] ([id number] Page 8 of 9 -------------------------------------------------------------------------------- Wound Assessment Details Patient Name: Date of Service: [name], [name]. 08/22/2022 1:30 PM Medical Record Number: [id number] Patient Account Number: [id number] Date of Birth/Sex: Treating RN: 11-02-1985 (37 y.o. [name] Primary Care Handy Mcloud: Vena Rua Other Clinician: Referring Selestino Nila: Treating Sheresa Cullop/Extender: [name]: 2 Wound Status Wound Number: 2 Primary  Etiology: Hidradenitis Wound Location: Right, Midline Chest Wound Status: Open Wounding Event: Gradually Appeared Comorbid History: Asthma, Type II Diabetes Date Acquired: 08/08/2022 Weeks Of [name]: 2 Clustered Wound: No Photos Wound Measurements Length: (cm) 0.5 Width: (cm) 0.5 Depth: (cm) 0.9 Area: (cm) 0.196 Volume: (cm) 0.177 % Reduction in Area: 0% % Reduction in Volume: -124.1% Epithelialization: None Tunneling: No Undermining: No Wound Description Classification: Full Thickness Without Exposed Suppor Wound Margin: Distinct, outline attached Exudate Amount: Medium Exudate Type: Purulent Exudate Color: yellow, brown, green t Structures Foul Odor After Cleansing: No Slough/Fibrino No Wound Bed Granulation Amount: Large (67-100%) Exposed Structure Granulation Quality: Red, Pink, Hyper-granulation Fascia Exposed: No Necrotic Amount: None Present (0%) Fat Layer (Subcutaneous Tissue) Exposed: Yes Tendon Exposed: No Muscle Exposed: No Joint Exposed: No Bone Exposed: No Periwound Skin Texture Texture Color No Abnormalities Noted: No No Abnormalities Noted: No Callus: No Atrophie Blanche: No Crepitus: No Cyanosis: No Excoriation: No Ecchymosis: No Induration: No Erythema: No Rash: No Hemosiderin Staining: No Scarring: No Mottled: No Pallor: No Moisture Rubor: No No Abnormalities Noted: No Dry / Scaly: No DERRIN, MAGWOOD ([id number]) [phone].pdf Page 9 of 9 Maceration: No [name] Notes Wound #2 (Chest) Wound Laterality: Right, Midline Cleanser Soap and Water Discharge Instruction: May shower and wash wound with dial antibacterial soap and water prior to dressing change. Wound Cleanser Discharge Instruction: Cleanse the wound with wound cleanser prior to applying Caleb clean dressing using gauze sponges, not tissue or cotton balls. Peri-Wound Care Skin Prep Discharge Instruction: Use skin prep as directed Zinc Oxide Ointment  30g tube Discharge Instruction: Apply Zinc Oxide to periwound with each dressing change due to skin irritation Topical Primary Dressing vashe wet to dry Discharge Instruction: apply soaked vashe gauze directly to wound bed. Secondary Dressing ABD Pad, 8x10 Discharge Instruction: Apply over primary dressing as directed. Secured With Compression Wrap Compression Stockings Environmental education officer) Signed: 08/22/2022 4:38:40 PM By: Erenest Blank Signed: 08/22/2022 5:48:48 PM By: Deon Pilling RN, BSN Entered By: Erenest Blank on 08/22/2022 14:02:47 -------------------------------------------------------------------------------- Vitals Details Patient Name: Date of Service: [name], [name]. 08/22/2022 1:30 PM Medical Record  Number: [id number] Patient Account Number: [id number] Date of Birth/Sex: Treating RN: 1985/08/05 (37 y.o. M) Primary Care Leaf [employer]: Vena Rua Other Clinician: Referring Jakyria Bleau: Treating Annleigh Knueppel/Extender: [name]: 2 Vital Signs Time Taken: 13:52 Temperature (F): 98.5 Height (in): 69 Pulse (bpm): 111 Weight (lbs): 235 Respiratory Rate (breaths/min): 18 Body Mass Index (BMI): 34.7 Blood Pressure (mmHg): 150/92 Capillary Blood Glucose (mg/dl): 136 Reference Range: 80 - 120 mg / dl Electronic Signature(s) Signed: 08/22/2022 4:38:40 PM By: Erenest Blank Entered By: Erenest Blank on 08/22/2022 13:54:46

## 2022-08-22 NOTE — Progress Notes (Signed)
[name], Ancil [name] ([id number]) [id number] Page 1 of 8 Visit Report for 08/22/2022 Chief Complaint Document Details [facility]: [name] 08/22/2022 1:30 PM Medical Record Number: [id number] Patient Account Number: [id number] Date of Birth/Sex: Treating RN: 10/28/1985 (37 y.o. M) Primary Care Provider: Edwin DadaPaseda, Folashade Other Clinician: Referring Provider: Treating Provider/Extender: Selinda MichaelsHoffman, Lilianne Delair [name], Folashade Weeks in Treatment: 2 Information Obtained from: Patient Chief Complaint 1/52/23; patient is here predominantly for Caleb surgical wound in the left inframammary area extending into the left lateral chest. Patient also has Caleb small area on the right inframammary area probably related to hidradenitis Electronic Signature(s) Signed: 08/22/2022 3:52:58 PM By: Geralyn CorwinHoffman, Amias Hutchinson DO Entered By: Geralyn CorwinHoffman, Eulia Hatcher on 08/22/2022 14:35:23 -------------------------------------------------------------------------------- HPI Details Patient Name: Date of Service: [name], [name]. 08/22/2022 1:30 PM Medical Record Number: [id number] Patient Account Number: [id number] Date of Birth/Sex: Treating RN: 12/06/1985 (37 y.o. M) Primary Care Provider: Edwin DadaPaseda, Folashade Other Clinician: Referring Provider: Treating Provider/Extender: Jhonnie GarnerHoffman, Tashera Montalvo [name], Folashade Weeks in Treatment: 2 History of Present Illness HPI Description: ADMISSION 08/08/2021 This is Caleb 37 year old man who underwent an extensive hospitalization from 06/23/2022 through 07/13/2022 with Caleb large necrotic area on the left inframammary area extending into the left lateral chest. The patient states this came on fairly rapidly initially with Caleb boil. This was aspirated on 12/5 and he underwent an extensive operative debridement ultimately on 12/8. Cultures showed rare Prevotella and moderate actinomyces. He initially was treated with daptomycin and Unasyn in the hospital then  Augmentin and more recently he is continued on amoxicillin. [name] with Dr. Algis LimingVandam in Caleb few days. During the course of the hospitalization he was discovered to be Caleb new onset diabetic with Caleb hemoglobin A1c of 11.6. Since his discharge from the hospital he has been followed using Caleb wound VAC on the surgical area. There is an impressive postoperative picture in epic sewing Caleb large open surgical wound. There is been considerable improvement with the wound VAC being changed by home health. I cannot see the notes from either infectious disease or surgery Past medical history includes obesity, new onset diabetes as mentioned. He was Caleb smoker. 1/22; patient presents for follow-up. He has been using silver alginate 3 times weekly. He has some skin irritation to the left sided periwound. He had follow-up with infectious disease, Dr. Algis LimingVandam on 1/17. He is currently taking amoxicillin and plan is to continue this for the next 5 months. 1/29; patient presents for follow-up. He did not obtain Vashe solution. He has been using normal saline wet-to-dry dressings. He has no issues or complaints today. We gave him sample of Vashe in office. Electronic Signature(s) Signed: 08/22/2022 3:52:58 PM By: Geralyn CorwinHoffman, Anamari Galeas DO Entered By: Geralyn CorwinHoffman, Maryan Sivak on 08/22/2022 14:36:12 [name], [name] ([id number]) [id number] Page 2 of 8 -------------------------------------------------------------------------------- Chemical Cauterization Details Patient Name: Date of Service: [name], [name]. 08/22/2022 1:30 PM Medical Record Number: [id number] Patient Account Number: [id number] Date of Birth/Sex: Treating RN: 08/14/1985 (37 y.o. Caleb SoursM) Deaton, Bobbi Primary Care Provider: Edwin DadaPaseda, Folashade Other Clinician: Referring Provider: Treating Provider/Extender: Jhonnie GarnerHoffman, Emogene Muratalla [name], Folashade Weeks in Treatment: 2 Procedure Performed for: Wound #1 Left Chest Performed By: Physician Geralyn CorwinHoffman,  Danette Weinfeld, DO Post Procedure Diagnosis Same as Pre-procedure Notes silver nitrate used. Electronic Signature(s) Signed: 08/22/2022 3:52:58 PM By: Geralyn CorwinHoffman, Alexsus Papadopoulos DO Signed: 08/22/2022 5:48:48 PM By: Shawn Stalleaton, Bobbi RN, BSN Entered By: Shawn Stalleaton, Bobbi on 08/22/2022 14:19:30 -------------------------------------------------------------------------------- Physical Exam Details Patient Name:  Date of Service: [name], [name]. 08/22/2022 1:30 PM Medical Record Number: [id number] Patient Account Number: [id number] Date of Birth/Sex: Treating RN: March 04, 1986 (37 y.o. M) Primary Care Provider: Edwin Dada Other Clinician: Referring Provider: Treating Provider/Extender: Jhonnie Garner Weeks in Treatment: 2 Constitutional respirations regular, non-labored and within target range for patient.Marland Kitchen Psychiatric pleasant and cooperative. Notes Narrowed wound to the left inframamillary area extending into the left lateral chest wall. This has hypergranulation tissue throughout. No tunneling or undermining. T the right side under the breast there is Caleb small open area consistent with hidradenitis that tunnels at the 1 o'clock position. No surrounding signs o of infection. Electronic Signature(s) Signed: 08/22/2022 3:52:58 PM By: Geralyn Corwin DO Entered By: Geralyn Corwin on 08/22/2022 14:37:01 -------------------------------------------------------------------------------- Physician Orders Details Patient Name: Date of Service: [name], [name]. 08/22/2022 1:30 PM Medical Record Number: [id number] Patient Account Number: [id number] [name], [name] ([id number]) [phone].pdf Page 3 of 8 Date of Birth/Sex: Treating RN: 1985-08-25 (37 y.o. Harlon Flor, Millard.Loa Primary Care Provider: Other Clinician: Edwin Dada Referring Provider: Treating Provider/Extender: Selinda Michaels in Treatment: 2 Verbal / Phone Orders:  No Diagnosis Coding ICD-10 Coding Code Description T81.31XD Disruption of external operation (surgical) wound, not elsewhere classified, subsequent encounter L02.213 Cutaneous abscess of chest wall L98.498 Non-pressure chronic ulcer of skin of other sites with other specified severity L73.2 Hidradenitis suppurativa E11.622 Type 2 diabetes mellitus with other skin ulcer Follow-up Appointments ppointment in 1 week. - Dr. Mikey Bussing and Yvonne Kendall 130pm 08/29/2022 Monday Return Caleb ppointment in 2 weeks. - Dr. Mikey Bussing and Bryan 215pm Monday 09/05/2022 Return Caleb Other: - Dermatology to call you. Call them if you do not hear from them by Tomorrow [phone] Anesthetic (In clinic) Topical Lidocaine 4% applied to wound bed Bathing/ Shower/ Hygiene May shower and wash wound with soap and water. Negative Presssure Wound Therapy Discontinue wound vac. Call the number on the machine for the company to pick up. Home Health No change in wound care orders this week; continue Home Health for wound care. May utilize formulary equivalent dressing for wound treatment orders unless otherwise specified. - x2 week wound care changes. wound center weekly. change to vashe wet to dry dressings to all wounds. Other Home Health Orders/Instructions: - [facility] Wound Treatment Wound #1 - Chest Wound Laterality: Left Cleanser: Soap and Water 3 x Per Week/30 Days Discharge Instructions: May shower and wash wound with dial antibacterial soap and water prior to dressing change. Cleanser: Wound Cleanser (Home Health) 3 x Per Week/30 Days Discharge Instructions: Cleanse the wound with wound cleanser prior to applying Caleb clean dressing using gauze sponges, not tissue or cotton balls. Peri-Wound Care: Skin Prep (Home Health) 3 x Per Week/30 Days Discharge Instructions: Use skin prep as directed Peri-Wound Care: Zinc Oxide Ointment 30g tube (Home Health) 3 x Per Week/30 Days Discharge Instructions: Apply Zinc Oxide to  periwound with each dressing change due to skin irritation Prim Dressing: vashe wet to dry (Home Health) 3 x Per Week/30 Days ary Discharge Instructions: apply soaked vashe gauze directly to wound bed. Secondary Dressing: ABD Pad, 8x10 (Home Health) 3 x Per Week/30 Days Discharge Instructions: Apply over primary dressing as directed. Wound #2 - Chest Wound Laterality: Right, Midline Cleanser: Soap and Water 3 x Per Week/30 Days Discharge Instructions: May shower and wash wound with dial antibacterial soap and water prior to dressing change. Cleanser: Wound Cleanser (Home Health) 3 x Per Week/30  Days Discharge Instructions: Cleanse the wound with wound cleanser prior to applying Caleb clean dressing using gauze sponges, not tissue or cotton balls. Peri-Wound Care: Skin Prep (Home Health) 3 x Per Week/30 Days Discharge Instructions: Use skin prep as directed Peri-Wound Care: Zinc Oxide Ointment 30g tube (Home Health) 3 x Per Week/30 Days Discharge Instructions: Apply Zinc Oxide to periwound with each dressing change due to skin irritation Prim Dressing: vashe wet to dry (Home Health) 3 x Per Week/30 Days ary Discharge Instructions: apply soaked vashe gauze directly to wound bed. Secondary Dressing: ABD Pad, 8x10 (Home Health) 3 x Per Week/30 Days Discharge Instructions: Apply over primary dressing as directed. [name], [name] ([id number]) [id number] Page 4 of 8 Electronic Signature(s) Signed: 08/22/2022 3:52:58 PM By: Kalman Shan DO Entered By: Kalman Shan on 08/22/2022 14:37:10 -------------------------------------------------------------------------------- Problem List Details Patient Name: Date of Service: [name], [name]. 08/22/2022 1:30 PM Medical Record Number: [id number] Patient Account Number: [id number] Date of Birth/Sex: Treating RN: [birth year] (37 y.o. [name] Primary Care Provider: Vena Rua Other Clinician: Referring  Provider: Treating Provider/Extender: Salli Quarry Weeks in Treatment: 2 Active Problems ICD-10 Encounter Code Description Active Date MDM Diagnosis T81.31XD Disruption of external operation (surgical) wound, not elsewhere classified, 08/08/2022 No Yes subsequent encounter L02.213 Cutaneous abscess of chest wall 08/08/2022 No Yes L98.498 Non-pressure chronic ulcer of skin of other sites with other specified severity 08/08/2022 No Yes L73.2 Hidradenitis suppurativa 08/08/2022 No Yes E11.622 Type 2 diabetes mellitus with other skin ulcer 08/08/2022 No Yes Inactive Problems Resolved Problems Electronic Signature(s) Signed: 08/22/2022 3:52:58 PM By: Kalman Shan DO Entered By: Kalman Shan on 08/22/2022 14:35:06 -------------------------------------------------------------------------------- Progress Note Details Patient Name: Date of Service: [name], [name]. 08/22/2022 1:30 PM Medical Record Number: [id number] Patient Account Number: [id number] Date of Birth/Sex: Treating RN: 1985/09/25 (37 y.o. M) Primary Care Provider: Vena Rua Other Clinician: Referring Provider: Treating Provider/Extender: Jhonnie Garner in Treatment: 2 [name], [name] ([id number]) [id number] Page 5 of 8 Subjective Chief Complaint Information obtained from Patient 1/52/23; patient is here predominantly for Caleb surgical wound in the left inframammary area extending into the left lateral chest. Patient also has Caleb small area on the right inframammary area probably related to hidradenitis History of Present Illness (HPI) ADMISSION 08/08/2021 This is Caleb 37 year old man who underwent an extensive hospitalization from 06/23/2022 through 07/13/2022 with Caleb large necrotic area on the left inframammary area extending into the left lateral chest. The patient states this came on fairly rapidly initially with Caleb boil. This was aspirated on  12/5 and he underwent an extensive operative debridement ultimately on 12/8. Cultures showed rare Prevotella and moderate actinomyces. He initially was treated with daptomycin and Unasyn in the hospital then Augmentin and more recently he is continued on amoxicillin. [name] with Dr. Drucilla Schmidt in Caleb few days. During the course of the hospitalization he was discovered to be Caleb new onset diabetic with Caleb hemoglobin A1c of 11.6. Since his discharge from the hospital he has been followed using Caleb wound VAC on the surgical area. There is an impressive postoperative picture in epic sewing Caleb large open surgical wound. There is been considerable improvement with the wound VAC being changed by home health. I cannot see the notes from either infectious disease or surgery Past medical history includes obesity, new onset diabetes as mentioned. He was Caleb smoker. 1/22; patient presents for follow-up. He has been using silver alginate  3 times weekly. He has some skin irritation to the left sided periwound. He had follow-up with infectious disease, Dr. Drucilla Schmidt on 1/17. He is currently taking amoxicillin and plan is to continue this for the next 5 months. 1/29; patient presents for follow-up. He did not obtain Vashe solution. He has been using normal saline wet-to-dry dressings. He has no issues or complaints today. We gave him sample of Vashe in office. Patient History Family History Cancer - Maternal Grandparents,Paternal Grandparents, Diabetes - Mother. Social History Current every day smoker - 1 ppd, Alcohol Use - Moderate. Medical History Respiratory Patient has history of Asthma Endocrine Patient has history of Type II Diabetes Hospitalization/Surgery History - 12/8 and12/10 IandD left breast abscess. Medical Caleb Surgical History Notes nd Constitutional Symptoms (General Health) breast abscess Psychiatric anxiety Objective Constitutional respirations regular, non-labored and within target range  for patient.. Vitals Time Taken: 1:52 PM, Height: 69 in, Weight: 235 lbs, BMI: 34.7, Temperature: 98.5 F, Pulse: 111 bpm, Respiratory Rate: 18 breaths/min, Blood Pressure: 150/92 mmHg, Capillary Blood Glucose: 136 mg/dl. Psychiatric pleasant and cooperative. General Notes: Narrowed wound to the left inframamillary area extending into the left lateral chest wall. This has hypergranulation tissue throughout. No tunneling or undermining. T the right side under the breast there is Caleb small open area consistent with hidradenitis that tunnels at the 1 o'clock position. No o surrounding signs of infection. Integumentary (Hair, Skin) Wound #1 status is Open. Original cause of wound was Surgical Injury. The date acquired was: 07/03/2022. The wound has been in treatment 2 weeks. The wound is located on the Left Chest. The wound measures 1cm length x 17.4cm width x 0.1cm depth; 13.666cm^2 area and 1.367cm^3 volume. There is Fat Layer (Subcutaneous Tissue) exposed. There is no tunneling or undermining noted. There is Caleb large amount of serosanguineous drainage noted. The wound margin is distinct with the outline attached to the wound base. There is large (67-100%) red, pink, hyper - granulation within the wound bed. There is no necrotic tissue within the wound bed. The periwound skin appearance did not exhibit: Callus, Crepitus, Excoriation, Induration, Rash, Scarring, Dry/Scaly, Maceration, Atrophie Blanche, Cyanosis, Ecchymosis, Hemosiderin Staining, Mottled, Pallor, Rubor, Erythema. Wound #2 status is Open. Original cause of wound was Gradually Appeared. The date acquired was: 08/08/2022. The wound has been in treatment 2 weeks. The wound is located on the Right,Midline Chest. The wound measures 0.5cm length x 0.5cm width x 0.9cm depth; 0.196cm^2 area and 0.177cm^3 volume. There is Fat Layer (Subcutaneous Tissue) exposed. There is no tunneling or undermining noted. There is Caleb medium amount of purulent  drainage noted. The wound margin is distinct with the outline attached to the wound base. There is large (67-100%) red, pink, hyper - granulation within the wound bed. There is no necrotic [name], [name] ([id number]) [phone].pdf Page 6 of 8 tissue within the wound bed. The periwound skin appearance did not exhibit: Callus, Crepitus, Excoriation, Induration, Rash, Scarring, Dry/Scaly, Maceration, Atrophie Blanche, Cyanosis, Ecchymosis, Hemosiderin Staining, Mottled, Pallor, Rubor, Erythema. Assessment Active Problems ICD-10 Disruption of external operation (surgical) wound, not elsewhere classified, subsequent encounter Cutaneous abscess of chest wall Non-pressure chronic ulcer of skin of other sites with other specified severity Hidradenitis suppurativa Type 2 diabetes mellitus with other skin ulcer Patient's left axillary/chest wound appears well-healing. I used silver nitrate to the hyper granulated areas. We gave Caleb sample of wash in the office for wet-to- dry dressings. T the area under the right breast I recommended continuing with Vashe wet-to-dry dressings.  Referral has been made to dermatology to o evaluate this. Procedures Wound #1 Pre-procedure diagnosis of Wound #1 is an Open Surgical Wound located on the Left Chest . An Chemical Cauterization procedure was performed by Geralyn Corwin, DO. Post procedure Diagnosis Wound #1: Same as Pre-Procedure Notes: silver nitrate used. Plan Follow-up Appointments: Return Appointment in 1 week. - Dr. Mikey Bussing and Yvonne Kendall 130pm 08/29/2022 Monday Return Appointment in 2 weeks. - Dr. Mikey Bussing and Yvonne Kendall 215pm Monday 09/05/2022 Other: - Dermatology to call you. Call them if you do not hear from them by Tomorrow [phone] Anesthetic: (In clinic) Topical Lidocaine 4% applied to wound bed Bathing/ Shower/ Hygiene: May shower and wash wound with soap and water. Negative Presssure Wound Therapy: Discontinue wound vac. Call  the number on the machine for the company to pick up. Home Health: No change in wound care orders this week; continue Home Health for wound care. May utilize formulary equivalent dressing for wound treatment orders unless otherwise specified. - x2 week wound care changes. wound center weekly. change to vashe wet to dry dressings to all wounds. Other Home Health Orders/Instructions: - [facility] WOUND #1: - Chest Wound Laterality: Left Cleanser: Soap and Water 3 x Per Week/30 Days Discharge Instructions: May shower and wash wound with dial antibacterial soap and water prior to dressing change. Cleanser: Wound Cleanser (Home Health) 3 x Per Week/30 Days Discharge Instructions: Cleanse the wound with wound cleanser prior to applying Caleb clean dressing using gauze sponges, not tissue or cotton balls. Peri-Wound Care: Skin Prep (Home Health) 3 x Per Week/30 Days Discharge Instructions: Use skin prep as directed Peri-Wound Care: Zinc Oxide Ointment 30g tube (Home Health) 3 x Per Week/30 Days Discharge Instructions: Apply Zinc Oxide to periwound with each dressing change due to skin irritation Prim Dressing: vashe wet to dry (Home Health) 3 x Per Week/30 Days ary Discharge Instructions: apply soaked vashe gauze directly to wound bed. Secondary Dressing: ABD Pad, 8x10 (Home Health) 3 x Per Week/30 Days Discharge Instructions: Apply over primary dressing as directed. WOUND #2: - Chest Wound Laterality: Right, Midline Cleanser: Soap and Water 3 x Per Week/30 Days Discharge Instructions: May shower and wash wound with dial antibacterial soap and water prior to dressing change. Cleanser: Wound Cleanser (Home Health) 3 x Per Week/30 Days Discharge Instructions: Cleanse the wound with wound cleanser prior to applying Caleb clean dressing using gauze sponges, not tissue or cotton balls. Peri-Wound Care: Skin Prep (Home Health) 3 x Per Week/30 Days Discharge Instructions: Use skin prep as  directed Peri-Wound Care: Zinc Oxide Ointment 30g tube (Home Health) 3 x Per Week/30 Days Discharge Instructions: Apply Zinc Oxide to periwound with each dressing change due to skin irritation Prim Dressing: vashe wet to dry (Home Health) 3 x Per Week/30 Days ary Discharge Instructions: apply soaked vashe gauze directly to wound bed. Secondary Dressing: ABD Pad, 8x10 (Home Health) 3 x Per Week/30 Days Discharge Instructions: Apply over primary dressing as directed. 1. Vashe wet-to-dry dressings 2. Follow-up in 1 week 3. Silver nitrate [name], [name] ([id number]) [phone].pdf Page 7 of 8 Electronic Signature(s) Signed: 08/22/2022 3:52:58 PM By: Geralyn Corwin DO Entered By: Geralyn Corwin on 08/22/2022 14:38:22 -------------------------------------------------------------------------------- HxROS Details Patient Name: Date of Service: [name], [name]. 08/22/2022 1:30 PM Medical Record Number: [id number] Patient Account Number: [id number] Date of Birth/Sex: Treating RN: 1985/09/19 (37 y.o. M) Primary Care Provider: Edwin Dada Other Clinician: Referring Provider: Treating Provider/Extender: Jhonnie Garner Weeks in Treatment:  2 Constitutional Symptoms (General Health) Medical History: Past Medical History Notes: breast abscess Respiratory Medical History: Positive for: Asthma Endocrine Medical History: Positive for: Type II Diabetes Treated with: Insulin Psychiatric Medical History: Past Medical History Notes: anxiety Immunizations Implantable Devices No devices added Hospitalization / Surgery History Type of Hospitalization/Surgery 12/8 and12/10 IandD left breast abscess Family and Social History Cancer: Yes - Maternal Grandparents,Paternal Grandparents; Diabetes: Yes - Mother; Current every day smoker - 1 ppd; Alcohol Use: Moderate Electronic Signature(s) Signed: 08/22/2022 3:52:58 PM By: Geralyn Corwin  DO Entered By: Geralyn Corwin on 08/22/2022 14:36:18 -------------------------------------------------------------------------------- SuperBill Details Patient Name: Date of Service: [name], [name]. 08/22/2022 Caleb Hartshorn ([id number]) [id number] Page 8 of 8 Medical Record Number: [id number] Patient Account Number: [id number] Date of Birth/Sex: Treating RN: 1986/01/26 (38 y.o. [name] Primary Care Provider: Edwin Dada Other Clinician: Referring Provider: Treating Provider/Extender: Jhonnie Garner Weeks in Treatment: 2 Diagnosis Coding ICD-10 Codes Code Description T81.31XD Disruption of external operation (surgical) wound, not elsewhere classified, subsequent encounter L02.213 Cutaneous abscess of chest wall L98.498 Non-pressure chronic ulcer of skin of other sites with other specified severity L73.2 Hidradenitis suppurativa E11.622 Type 2 diabetes mellitus with other skin ulcer Facility Procedures : CPT4 Code: [id number] Description: 17250 - CHEM CAUT GRANULATION TISS ICD-10 Diagnosis Description L98.498 Non-pressure chronic ulcer of skin of other sites with other specified severity L73.2 Hidradenitis suppurativa E11.622 Type 2 diabetes mellitus with other skin ulcer Modifier: Quantity: 1 Physician Procedures : CPT4 Code Description Modifier [id number] - WC PHYS CHEM CAUT GRAN TISSUE ICD-10 Diagnosis Description L98.498 Non-pressure chronic ulcer of skin of other sites with other specified severity L73.2 Hidradenitis suppurativa E11.622 Type 2 diabetes  mellitus with other skin ulcer Quantity: 1 Electronic Signature(s) Signed: 08/22/2022 3:52:58 PM By: Geralyn Corwin DO Entered By: Geralyn Corwin on 08/22/2022 14:38:38

## 2022-08-26 ENCOUNTER — Encounter: Payer: Self-pay | Admitting: Nurse Practitioner

## 2022-08-26 ENCOUNTER — Ambulatory Visit (INDEPENDENT_AMBULATORY_CARE_PROVIDER_SITE_OTHER): Payer: No Typology Code available for payment source | Admitting: Nurse Practitioner

## 2022-08-26 ENCOUNTER — Telehealth: Payer: Self-pay

## 2022-08-26 VITALS — BP 145/87 | HR 107 | Temp 98.8°F

## 2022-08-26 DIAGNOSIS — F329 Major depressive disorder, single episode, unspecified: Secondary | ICD-10-CM | POA: Diagnosis not present

## 2022-08-26 DIAGNOSIS — L03313 Cellulitis of chest wall: Secondary | ICD-10-CM | POA: Diagnosis not present

## 2022-08-26 DIAGNOSIS — Z0000 Encounter for general adult medical examination without abnormal findings: Secondary | ICD-10-CM | POA: Insufficient documentation

## 2022-08-26 DIAGNOSIS — I10 Essential (primary) hypertension: Secondary | ICD-10-CM | POA: Insufficient documentation

## 2022-08-26 DIAGNOSIS — E1140 Type 2 diabetes mellitus with diabetic neuropathy, unspecified: Secondary | ICD-10-CM | POA: Diagnosis not present

## 2022-08-26 DIAGNOSIS — Z0271 Encounter for disability determination: Secondary | ICD-10-CM

## 2022-08-26 MED ORDER — GABAPENTIN 100 MG PO CAPS: 100.0000 mg | ORAL_CAPSULE | Freq: Three times a day (TID) | ORAL | 3 refills | Status: DC

## 2022-08-26 MED ORDER — LISINOPRIL 5 MG PO TABS: 5.0000 mg | ORAL_TABLET | Freq: Every day | ORAL | 3 refills | Status: DC

## 2022-08-26 NOTE — Progress Notes (Addendum)
Established Patient Office Visit  Subjective:  Patient ID: [name], male    DOB: 1985/12/30  Age: 37 y.o. MRN: [id number]  CC: No chief complaint on file.   HPI [name] is a 37 y.o. male with past medical history of uncontrolled type 2 diabetes, alcohol use, obesity, left chest wall cellulitis, hidradenitis suppurativa is here to discuss extending his short term disability due to still having significant pain worse with activities. He has been going to the wound care center once weekly  for wound care, has home health nurses assisting with dressing changes at home twice weekly. He stated that he was told that it will take about 6 more week before he is able to go back to work to avoid wound dehiscence. His pain ranges from 4-6/10.  He c/o chronic numbness and tingling to both feet since over one year . Patient currently denies fever, chills, malaise, unintentional weight loss, chest pain, shortness of breath,nausea, vomiting,  He reports depression from not being able to go back to work and having difficulty paying  his bills..  He denies SI, HI.  He refused referral for counseling or medication.     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   Asthma    Breast abscess 06/28/2022   Chest wall abscess 08/10/2022   Intertrigo 08/10/2022    Past Surgical History:  Procedure Laterality Date   INCISION AND DRAINAGE ABSCESS Left 07/01/2022   Procedure: INCISION AND DRAINAGE ABSCESS;  Surgeon: Jovita Kussmaul, MD;  Location: [facility];  Service: General;  Laterality: Left;   IRRIGATION AND DEBRIDEMENT ABSCESS Left 07/03/2022   Procedure: IRRIGATION AND DEBRIDEMENT CHEST WALL / Breast ABSCESS;  Surgeon: Georganna Skeans, MD;  Location: [facility];  Service: General;  Laterality: Left;    Family History  Problem Relation Age of Onset   Diabetes Mother    Breast cancer Maternal Grandmother    Diabetes Paternal Grandmother    Diabetes Paternal Grandfather     Colon cancer Neg Hx     Social History   Socioeconomic History   Marital status: Single    Spouse name: Not on file   Number of children: 1   Years of education: Not on file   Highest education level: Not on file  Occupational History   Not on file  Tobacco Use   Smoking status: Every Day    Packs/day: 0.30    Types: Cigarettes   Smokeless tobacco: Never  Substance and Sexual Activity   Alcohol use: Yes    Alcohol/week: 13.0 standard drinks of alcohol    Types: 9 Cans of beer, 4 Shots of liquor per week    Comment: occasional   Drug use: Not Currently    Types: Marijuana   Sexual activity: Yes  Other Topics Concern   Not on file  Social History Narrative   Lives with his mother.    Social Determinants of Health   Financial Resource Strain: Not on file  Food Insecurity: No Food Insecurity (06/28/2022)   Hunger Vital Sign    Worried About Running Out of Food in the Last Year: Never true    Ran Out of Food in the Last Year: Never true  Transportation Needs: No Transportation Needs (06/28/2022)   PRAPARE - Hydrologist (Medical): No    Lack of Transportation (Non-Medical): No  Physical Activity: Not on file  Stress: Not on file  Social Connections: Not  on file  Intimate Partner Violence: Not At Risk (06/28/2022)   Humiliation, Afraid, Rape, and Kick questionnaire    Fear of Current or Ex-Partner: No    Emotionally Abused: No    Physically Abused: No    Sexually Abused: No    Outpatient Medications Prior to Visit  Medication Sig Dispense Refill   Accu-Chek FastClix Lancets MISC Use as directed 4 times a day before meals and at bedtime 102 each 0   amoxicillin (AMOXIL) 500 MG capsule Take 1 capsule (500 mg total) by mouth 3 (three) times daily for 40 days. START TAKING on 08/11/2022 after you finish Augmentin (amoxicillin/clavulanic acid) 90 capsule 4   atorvastatin (LIPITOR) 10 MG tablet Take 1 tablet (10 mg total) by mouth daily. 90  tablet 0   blood glucose meter kit and supplies KIT Use as directed 1 each 0   fluconazole (DIFLUCAN) 100 MG tablet Take 1 tablet (100 mg total) by mouth daily. 14 tablet 4   glucose blood test strip Use as directed 4 times a day before meals and at bedtime 100 each 0   ibuprofen (ADVIL) 200 MG tablet Take 200 mg by mouth every 6 (six) hours as needed for mild pain.     insulin aspart (NOVOLOG) 100 UNIT/ML FlexPen Before each meal 3 times a day, CBG 140-199 - 2 units, 200-250 - 4 units, 251-299 - 6 units,  300-349 - 8 units,  350 or above-10 units. (Patient taking differently: Before each meal 3 times a day, CBG 140-199 - 2 units, 200-250 - 4 units, 251-299 - 6 units,  300-349 - 8 units,  350 or above-10 units.) 15 mL 0   Insulin Lispro Prot & Lispro (HUMALOG MIX 75/25 KWIKPEN) (75-25) 100 UNIT/ML Kwikpen Inject 15 Units into the skin 2 (two) times daily. 15 mL 11   Insulin Pen Needle 32G X 4 MM MISC Use as directed for 4 times a day for insulin pens 100 each 0   acetaminophen (TYLENOL) 325 MG tablet Take 2 tablets (650 mg total) by mouth every 8 (eight) hours as needed for moderate pain. (Patient not taking: Reported on 08/10/2022) 20 tablet 0   nystatin (MYCOSTATIN/NYSTOP) powder Apply 1 Application topically 3 (three) times daily. Can use as preventative powder for yeast infection (Patient not taking: Reported on 08/26/2022) 30 g 5   oxyCODONE 10 MG TABS Take 0.5-1 tablets (5-10 mg total) by mouth every 6 (six) hours as needed for severe pain or moderate pain (5mg  for moderate pain, 10mg  for severe pain). (Patient not taking: Reported on 08/26/2022) 25 tablet 0   Probiotic Product (RISA-BID PROBIOTIC) TABS Take 1 tablet by mouth 2 (two) times daily. (Patient not taking: Reported on 08/26/2022) 60 tablet 2   No facility-administered medications prior to visit.    Allergies  Allergen Reactions   Peach [Prunus Persica]     Pt reports swelling and lip tingling as reaction    ROS Review of Systems   Constitutional:  Negative for activity change, appetite change, chills, diaphoresis, fatigue and fever.  Respiratory:  Negative for apnea, cough, choking, chest tightness and shortness of breath.   Cardiovascular: Negative.  Negative for chest pain, palpitations and leg swelling.  Gastrointestinal:  Negative for abdominal distention, abdominal pain, anal bleeding and blood in stool.  Genitourinary:  Negative for difficulty urinating, dysuria, enuresis, flank pain and frequency.  Musculoskeletal:  Positive for myalgias.  Skin:  Positive for wound.  Neurological:  Negative for dizziness, facial asymmetry, speech difficulty,  light-headedness and headaches.  Psychiatric/Behavioral:  Negative for agitation, behavioral problems, confusion, decreased concentration and suicidal ideas.       Objective:    Physical Exam Constitutional:      General: He is not in acute distress.    Appearance: He is obese. He is not ill-appearing, toxic-appearing or diaphoretic.  Eyes:     General: No scleral icterus.       Right eye: No discharge.        Left eye: No discharge.     Extraocular Movements: Extraocular movements intact.  Cardiovascular:     Rate and Rhythm: Normal rate and regular rhythm.     Pulses: Normal pulses.     Heart sounds: Normal heart sounds. No murmur heard.    No friction rub. No gallop.  Pulmonary:     Effort: Pulmonary effort is normal. No respiratory distress.     Breath sounds: Normal breath sounds. No stridor. No wheezing, rhonchi or rales.  Chest:     Chest wall: No tenderness.  Abdominal:     General: There is no distension.     Palpations: Abdomen is soft.  Musculoskeletal:        General: No swelling or tenderness.     Right lower leg: No edema.     Left lower leg: No edema.  Skin:    Comments: Wound dressing clean and dry, unable to assess  wound due to dressing in place   Neurological:     Mental Status: He is alert and oriented to person, place, and time.      Motor: No weakness.     Coordination: Coordination normal.     Gait: Gait normal.  Psychiatric:        Mood and Affect: Mood normal.        Behavior: Behavior normal.        Thought Content: Thought content normal.        Judgment: Judgment normal.     BP (!) 145/87   Pulse (!) 107   Temp 98.8 F (37.1 C)   SpO2 100%  Wt Readings from Last 3 Encounters:  08/10/22 236 lb (107 kg)  07/22/22 241 lb 6.4 oz (109.5 kg)  07/03/22 257 lb 11.5 oz (116.9 kg)    Lab Results  Component Value Date   TSH 3.788 06/24/2022   Lab Results  Component Value Date   WBC 5.7 08/10/2022   HGB 12.5 (L) 08/10/2022   HCT 37.3 (L) 08/10/2022   MCV 80.9 08/10/2022   PLT 442 (H) 08/10/2022   Lab Results  Component Value Date   NA 141 08/10/2022   K 4.5 08/10/2022   CO2 27 08/10/2022   GLUCOSE 188 (H) 08/10/2022   BUN 7 08/10/2022   CREATININE 0.69 08/10/2022   BILITOT 0.4 08/10/2022   ALKPHOS 130 (H) 07/22/2022   AST 14 08/10/2022   ALT 18 08/10/2022   PROT 7.7 08/10/2022   ALBUMIN 3.6 (L) 07/22/2022   CALCIUM 9.7 08/10/2022   ANIONGAP 7 07/11/2022   EGFR 121 08/10/2022   Lab Results  Component Value Date   CHOL 257 (H) 07/22/2022   Lab Results  Component Value Date   HDL 43 07/22/2022   Lab Results  Component Value Date   LDLCALC 175 (H) 07/22/2022   Lab Results  Component Value Date   TRIG 209 (H) 07/22/2022   Lab Results  Component Value Date   CHOLHDL 6.0 (H) 07/22/2022   Lab Results  Component Value  Date   HGBA1C 11.6 (H) 06/24/2022      Assessment & Plan:   Problem List Items Addressed This Visit       Cardiovascular and Mediastinum   Primary hypertension    BP Readings from Last 3 Encounters:  08/26/22 (!) 145/87  08/10/22 (!) 152/108  07/22/22 110/68  Blood pressure is significantly elevated today start lisinopril 5 mg daily Blood pressure goal is less than 130/80 DASH diet advised engage in regular daily exercises at least 150 minutes weekly as  tolerated Follow-up in 4 weeks BMP in 2 weeks      Relevant Medications   lisinopril (ZESTRIL) 5 MG tablet   Other Relevant Orders   Basic Metabolic Panel     Other   Cellulitis of chest wall - Primary    Wound VAC has been discontinued Goes to the wound care clinic once weekly Home health nurses assisting with wound care at home twice weekly Takes ibuprofen as needed for pain not taking narcotic due to constipation. Continue amoxicillin 500mg  TID maintain close follow up with ID and wound care specialists       Reactive depression    Due to is inability to return to work, pay his bills He denies SI, HI Refused referral for counseling Will continue to monitor      Disability examination    Paperwork completed today, will be faxed to his employer      Other Visit Diagnoses     Neuropathy due to type 2 diabetes mellitus (HCC)       Relevant Medications   lisinopril (ZESTRIL) 5 MG tablet   gabapentin (NEURONTIN) 100 MG capsule       Meds ordered this encounter  Medications   lisinopril (ZESTRIL) 5 MG tablet    Sig: Take 1 tablet (5 mg total) by mouth daily.    Dispense:  90 tablet    Refill:  3   gabapentin (NEURONTIN) 100 MG capsule    Sig: Take 1 capsule (100 mg total) by mouth 3 (three) times daily.    Dispense:  90 capsule    Refill:  3    Follow-up: Return in about 4 weeks (around 09/23/2022) for HTN.    Renee Rival, FNP

## 2022-08-26 NOTE — Assessment & Plan Note (Addendum)
Wound VAC has been discontinued Goes to the wound care clinic once weekly [place] with wound care at home twice weekly Takes ibuprofen as needed for pain not taking narcotic due to constipation. Continue amoxicillin 500mg  TID maintain close follow up with ID and wound care specialists

## 2022-08-26 NOTE — Telephone Encounter (Signed)
Called to advise that we are waiting to have the note completed for his short disability pappper work. [place]

## 2022-08-26 NOTE — Assessment & Plan Note (Signed)
Due to is inability to return to work, pay his bills He denies SI, HI Refused referral for counseling Will continue to monitor

## 2022-08-26 NOTE — Assessment & Plan Note (Signed)
Paperwork completed today, will be faxed to his employer

## 2022-08-26 NOTE — Patient Instructions (Addendum)
1. Primary hypertension  - lisinopril (ZESTRIL) 5 MG tablet; Take 1 tablet (5 mg total) by mouth daily.  Dispense: 90 tablet; Refill: 3 - Basic Metabolic Panel; Future   2. Neuropathy due to medical condition (HCC)  - gabapentin (NEURONTIN) 100 MG capsule; Take 1 capsule (100 mg total) by mouth 3 (three) times daily.    Around 3 times per week, check your blood pressure 2 times per day. once in the morning and once in the evening. The readings should be at least one minute apart. Write down these values and bring them to your next nurse visit/appointment.  When you check your BP, make sure you have been doing something calm/relaxing 5 minutes prior to checking. Both feet should be flat on the floor and you should be sitting. Use your left arm and make sure it is in a relaxed position (on a table), and that the cuff is at the approximate level/height of your heart.  Blood pressure goal is less than 130/80   It is important that you exercise regularly at least 30 minutes 5 times a week as tolerated  Think about what you will eat, plan ahead. Choose " clean, green, fresh or frozen" over canned, processed or packaged foods which are more sugary, salty and fatty. 70 to 75% of food eaten should be vegetables and fruit. Three meals at set times with snacks allowed between meals, but they must be fruit or vegetables. Aim to eat over a 12 hour period , example 7 am to 7 pm, and STOP after  your last meal of the day. Drink water,generally about 64 ounces per day, no other drink is as healthy. Fruit juice is best enjoyed in a healthy way, by EATING the fruit.  Thanks for choosing [facility] we consider it a privelige to serve you.

## 2022-08-26 NOTE — Assessment & Plan Note (Signed)
BP Readings from Last 3 Encounters:  08/26/22 (!) 145/87  08/10/22 (!) 152/108  07/22/22 110/68  Blood pressure is significantly elevated today start lisinopril 5 mg daily Blood pressure goal is less than 130/80 DASH diet advised engage in regular daily exercises at least 150 minutes weekly as tolerated Follow-up in 4 weeks BMP in 2 weeks

## 2022-08-29 ENCOUNTER — Encounter (HOSPITAL_BASED_OUTPATIENT_CLINIC_OR_DEPARTMENT_OTHER): Payer: No Typology Code available for payment source | Attending: Internal Medicine | Admitting: Internal Medicine

## 2022-08-29 DIAGNOSIS — E11622 Type 2 diabetes mellitus with other skin ulcer: Secondary | ICD-10-CM | POA: Insufficient documentation

## 2022-08-29 DIAGNOSIS — J45909 Unspecified asthma, uncomplicated: Secondary | ICD-10-CM | POA: Diagnosis not present

## 2022-08-29 DIAGNOSIS — Z09 Encounter for follow-up examination after completed treatment for conditions other than malignant neoplasm: Secondary | ICD-10-CM | POA: Diagnosis not present

## 2022-08-29 DIAGNOSIS — Z87891 Personal history of nicotine dependence: Secondary | ICD-10-CM | POA: Diagnosis not present

## 2022-08-29 DIAGNOSIS — L02213 Cutaneous abscess of chest wall: Secondary | ICD-10-CM | POA: Insufficient documentation

## 2022-08-29 DIAGNOSIS — L98498 Non-pressure chronic ulcer of skin of other sites with other specified severity: Secondary | ICD-10-CM | POA: Diagnosis not present

## 2022-08-29 DIAGNOSIS — L732 Hidradenitis suppurativa: Secondary | ICD-10-CM | POA: Insufficient documentation

## 2022-08-29 DIAGNOSIS — E669 Obesity, unspecified: Secondary | ICD-10-CM | POA: Diagnosis not present

## 2022-08-29 DIAGNOSIS — T8131XD Disruption of external operation (surgical) wound, not elsewhere classified, subsequent encounter: Secondary | ICD-10-CM | POA: Insufficient documentation

## 2022-08-29 DIAGNOSIS — Z6834 Body mass index (BMI) 34.0-34.9, adult: Secondary | ICD-10-CM | POA: Insufficient documentation

## 2022-08-31 NOTE — Progress Notes (Signed)
AMOUS, CREWE ([id number]) [id number] Page 1 of 8 Visit Report for 08/29/2022 Arrival Information Details Patient Name: Date of Service: [facility] [name], [name] 08/29/2022 1:30 PM Medical Record Number: [id number] Patient Account Number: [id number] Date of Birth/Sex: Treating RN: July 28, 1985 (37 y.o. Harlon Flor, Millard.Loa Primary Care Minna Dumire: Edwin Dada Other Clinician: Referring Jamel Dunton: Treating Marabelle Cushman/Extender: Selinda Michaels in Treatment: 3 Visit Information History Since Last Visit Added or deleted any medications: No Patient Arrived: Ambulatory Any new allergies or adverse reactions: No Arrival Time: 13:45 Had a fall or experienced change in No Accompanied By: self activities of daily living that may affect Transfer Assistance: None risk of falls: Patient Identification Verified: Yes Signs or symptoms of abuse/neglect since last visito No Secondary Verification Process Completed: Yes Hospitalized since last visit: No Patient Requires Transmission-Based Precautions: No Implantable device outside of the clinic excluding No Patient Has Alerts: No cellular tissue based products placed in the center since last visit: Has Dressing in Place as Prescribed: Yes Pain Present Now: Yes Electronic Signature(s) Signed: 08/30/2022 6:17:04 PM By: Shawn Stall RN, BSN Entered By: Shawn Stall on 08/29/2022 13:45:55 -------------------------------------------------------------------------------- Encounter Discharge Information Details Patient Name: Date of Service: [name], [name] 08/29/2022 1:30 PM Medical Record Number: [id number] Patient Account Number: [id number] Date of Birth/Sex: Treating RN: 03/18/1986 (37 y.o. [name] Primary Care Aizik Reh: Edwin Dada Other Clinician: Referring Mikell Camp: Treating Roshawna Colclasure/Extender: Selinda Michaels in Treatment: 3 Encounter Discharge Information  Items Discharge Condition: Stable Ambulatory Status: Ambulatory Discharge Destination: Home Transportation: Private Auto Accompanied By: self Schedule Follow-up Appointment: Yes Clinical Summary of Care: Electronic Signature(s) Signed: 08/30/2022 6:17:04 PM By: Shawn Stall RN, BSN Entered By: Shawn Stall on 08/29/2022 14:04:45 [name] ([id number]) [id number] Page 2 of 8 -------------------------------------------------------------------------------- Lower Extremity Assessment Details Patient Name: Date of Service: [name], [name] 08/29/2022 1:30 PM Medical Record Number: [id number] Patient Account Number: [id number] Date of Birth/Sex: Treating RN: February 21, 1986 (37 y.o. [name] Primary Care Jaron Czarnecki: Edwin Dada Other Clinician: Referring Derriona Branscom: Treating Beverly Suriano/Extender: Jhonnie Garner Weeks in Treatment: 3 Electronic Signature(s) Signed: 08/30/2022 6:17:04 PM By: Shawn Stall RN, BSN Entered By: Shawn Stall on 08/29/2022 13:45:35 -------------------------------------------------------------------------------- Multi Wound Chart Details Patient Name: Date of Service: [name], [name] 08/29/2022 1:30 PM Medical Record Number: [id number] Patient Account Number: [id number] Date of Birth/Sex: Treating RN: April 18, 1986 (37 y.o. M) Primary Care Cathy Ropp: Edwin Dada Other Clinician: Referring Crixus Mcaulay: Treating Ketra Duchesne/Extender: Jhonnie Garner Weeks in Treatment: 3 Vital Signs Height(in): 69 Capillary Blood Glucose(mg/dl): 220 Weight(lbs): 254 Pulse(bpm): 103 Body Mass Index(BMI): 34.7 Blood Pressure(mmHg): 142/93 Temperature(F): 99 Respiratory Rate(breaths/min): 20 [1:Photos:] [N/A:N/A] Left Chest Right, Midline Chest N/A Wound Location: Surgical Injury Gradually Appeared N/A Wounding Event: Open Surgical Wound Hidradenitis N/A Primary Etiology: Asthma, Type II Diabetes  Asthma, Type II Diabetes N/A Comorbid History: 07/03/2022 08/08/2022 N/A Date Acquired: 3 3 N/A Weeks of Treatment: Open Open N/A Wound Status: No No N/A Wound Recurrence: Yes No N/A Clustered Wound: 3 N/A N/A Clustered Quantity: 1x13.5x0.1 0.4x0.2x0.7 N/A Measurements L x W x D (cm) 10.603 0.063 N/A A (cm) : rea 1.06 0.044 N/A Volume (cm) : 64.00% 67.90% N/A % Reduction in A rea: 64.00% 44.30% N/A % Reduction in Volume: Full Thickness Without Exposed Full Thickness Without Exposed N/A Classification: Support Structures Support Structures Medium Medium N/A Exudate Amount: Purulent Purulent N/A Exudate Type: yellow, brown, green yellow,  brown, green N/A Exudate Color: Distinct, outline attached Distinct, outline attached N/A Wound Margin: Large (67-100%) Large (67-100%) N/A Granulation Amount: TEYTON, PATTILLO ([id number]) [id number] Page 3 of 8 Red, Pink, Hyper-granulation Red, Pink, Hyper-granulation N/A Granulation Quality: None Present (0%) None Present (0%) N/A Necrotic Amount: Fat Layer (Subcutaneous Tissue): Yes Fat Layer (Subcutaneous Tissue): Yes N/A Exposed Structures: Fascia: No Fascia: No Tendon: No Tendon: No Muscle: No Muscle: No Joint: No Joint: No Bone: No Bone: No Medium (34-66%) None N/A Epithelialization: Excoriation: No Excoriation: No N/A Periwound Skin Texture: Induration: No Induration: No Callus: No Callus: No Crepitus: No Crepitus: No Rash: No Rash: No Scarring: No Scarring: No Maceration: No Maceration: No N/A Periwound Skin Moisture: Dry/Scaly: No Dry/Scaly: No Atrophie Blanche: No Atrophie Blanche: No N/A Periwound Skin Color: Cyanosis: No Cyanosis: No Ecchymosis: No Ecchymosis: No Erythema: No Erythema: No Hemosiderin Staining: No Hemosiderin Staining: No Mottled: No Mottled: No Pallor: No Pallor: No Rubor: No Rubor: No Chemical Cauterization N/A N/A Procedures  Performed: Treatment Notes Electronic Signature(s) Signed: 08/30/2022 9:05:03 AM By: Kalman Shan DO Entered By: Kalman Shan on 08/29/2022 14:04:12 -------------------------------------------------------------------------------- Multi-Disciplinary Care Plan Details Patient Name: Date of Service: [name], [name] 08/29/2022 1:30 PM Medical Record Number: [id number] Patient Account Number: [id number] Date of Birth/Sex: Treating RN: 10-31-1985 (37 y.o. [name] Primary Care Damaso Laday: Vena Rua Other Clinician: Referring Donda Friedli: Treating Cordelro Gautreau/Extender: Salli Quarry Weeks in Treatment: 3 Active Inactive Pain, Acute or Chronic Nursing Diagnoses: Pain, acute or chronic: actual or potential Potential alteration in comfort, pain Goals: Patient will verbalize adequate pain control and receive pain control interventions during procedures as needed Date Initiated: 08/08/2022 Target Resolution Date: 09/02/2022 Goal Status: Active Patient/caregiver will verbalize comfort level met Date Initiated: 08/08/2022 Target Resolution Date: 09/01/2022 Goal Status: Active Interventions: Encourage patient to take pain medications as prescribed Provide education on pain management Reposition patient for comfort Treatment Activities: Administer pain control measures as ordered : 08/08/2022 Notes: Wound/Skin Impairment TYJAY, GALINDO ([id number]) [id number] Page 4 of 8 Nursing Diagnoses: Knowledge deficit related to ulceration/compromised skin integrity Goals: Patient/caregiver will verbalize understanding of skin care regimen Date Initiated: 08/08/2022 Target Resolution Date: 09/01/2022 Goal Status: Active Interventions: Assess patient/caregiver ability to perform ulcer/skin care regimen upon admission and as needed Assess ulceration(s) every visit Provide education on ulcer and skin care Treatment Activities: Skin care regimen  initiated : 08/08/2022 Topical wound management initiated : 08/08/2022 Notes: Electronic Signature(s) Signed: 08/30/2022 6:17:04 PM By: Deon Pilling RN, BSN Entered By: Deon Pilling on 08/29/2022 13:52:54 -------------------------------------------------------------------------------- Non-Wound Condition Assessment Details Patient Name: Date of Service: [name], [name] 08/29/2022 1:30 PM Medical Record Number: [id number] Patient Account Number: [id number] Date of Birth/Sex: Treating RN: June 07, 1986 (37 y.o. [name] Primary Care Lorence Nagengast: Vena Rua Other Clinician: Referring Klay Sobotka: Treating Erie Radu/Extender: Salli Quarry Weeks in Treatment: 3 Non-Wound Condition: Condition: Other Dermatologic Condition Location: Chest Side: Right Periwound Skin Texture Texture Color No Abnormalities Noted: No No Abnormalities Noted: No Callus: No Atrophie Blanche: No Crepitus: No Cyanosis: No Excoriation: No Ecchymosis: No Friable: No Erythema: No Induration: No Hemosiderin Staining: No Rash: No Mottled: No Scarring: No Pallor: No Rubor: No Moisture No Abnormalities Noted: No Dry / Scaly: No Maceration: No Notes open area noted to hypergranulated tissue that tunnel depth 0.9 Electronic Signature(s) Signed: 08/30/2022 6:17:04 PM By: Deon Pilling RN, BSN Entered By: Deon Pilling on 08/29/2022 15:23:42 [name] ([id number]) [id number] Page 5 of  8 -------------------------------------------------------------------------------- Pain Assessment Details Patient Name: Date of Service: [name], [name] 08/29/2022 1:30 PM Medical Record Number: [id number] Patient Account Number: [id number] Date of Birth/Sex: Treating RN: Feb 10, 1986 (37 y.o. [name] Primary Care Kamariah Fruchter: Vena Rua Other Clinician: Referring Laurinda Carreno: Treating Reveca Desmarais/Extender: Salli Quarry Weeks in Treatment:  3 Active Problems Location of Pain Severity and Description of Pain Patient Has Paino Yes Site Locations Pain Location: Pain in Ulcers Rate the pain. Current Pain Level: 3 Pain Management and Medication Current Pain Management: Medication: No Cold Application: No Rest: No Massage: No Activity: No T.E.N.S.: No Heat Application: No Leg drop or elevation: No Is the Current Pain Management Adequate: Adequate How does your wound impact your activities of daily livingo Sleep: No Bathing: No Appetite: No Relationship With Others: No Bladder Continence: No Emotions: No Bowel Continence: No Work: No Toileting: No Drive: No Dressing: No Hobbies: No Engineer, maintenance) Signed: 08/30/2022 6:17:04 PM By: Deon Pilling RN, BSN Entered By: Deon Pilling on 08/29/2022 13:45:30 -------------------------------------------------------------------------------- Patient/Caregiver Education Details Patient Name: Date of Service: [facility] [name], A A RO Haywood Filler 2/5/2024andnbsp1:30 PM Medical Record Number: [id number] Patient Account Number: [id number] Date of Birth/Gender: Treating RN: 06/10/1986 (37 y.o. [name] Primary Care Physician: Vena Rua Other Clinician: Referring Physician: Treating Physician/Extender: Jhonnie Garner in Treatment: 3 NYEEM, STOKE ([id number]) [id number] Page 6 of 8 Education Assessment Education Provided To: Patient Education Topics Provided Wound/Skin Impairment: Handouts: Caring for Your Ulcer Methods: Explain/Verbal Responses: Reinforcements needed Electronic Signature(s) Signed: 08/30/2022 6:17:04 PM By: Deon Pilling RN, BSN Entered By: Deon Pilling on 08/29/2022 13:53:07 -------------------------------------------------------------------------------- Wound Assessment Details Patient Name: Date of Service: [name], [name] 08/29/2022 1:30 PM Medical Record Number: [id number] Patient  Account Number: [id number] Date of Birth/Sex: Treating RN: Feb 14, 1986 (37 y.o. [name] Primary Care Tiandra Swoveland: Vena Rua Other Clinician: Referring Yeilyn Gent: Treating Raliyah Montella/Extender: Salli Quarry Weeks in Treatment: 3 Wound Status Wound Number: 1 Primary Etiology: Open Surgical Wound Wound Location: Left Chest Wound Status: Open Wounding Event: Surgical Injury Comorbid History: Asthma, Type II Diabetes Date Acquired: 07/03/2022 Weeks Of Treatment: 3 Clustered Wound: Yes Photos Wound Measurements Length: (cm) Width: (cm) Depth: (cm) Clustered Quantity: Area: (cm) Volume: (cm) 1 % Reduction in Area: 64% 13.5 % Reduction in Volume: 64% 0.1 Epithelialization: Medium (34-66%) 3 Tunneling: No 10.603 Undermining: No 1.06 Wound Description Classification: Full Thickness Without Exposed Sup Wound Margin: Distinct, outline attached Exudate Amount: Medium Exudate Type: Purulent Exudate Color: yellow, brown, green port Structures Foul Odor After Cleansing: No Slough/Fibrino No Wound Bed JESUS, NEVILLS ([id number]) [id number] Page 7 of 8 Granulation Amount: Large (67-100%) Exposed Structure Granulation Quality: Red, Pink, Hyper-granulation Fascia Exposed: No Necrotic Amount: None Present (0%) Fat Layer (Subcutaneous Tissue) Exposed: Yes Tendon Exposed: No Muscle Exposed: No Joint Exposed: No Bone Exposed: No Periwound Skin Texture Texture Color No Abnormalities Noted: No No Abnormalities Noted: No Callus: No Atrophie Blanche: No Crepitus: No Cyanosis: No Excoriation: No Ecchymosis: No Induration: No Erythema: No Rash: No Hemosiderin Staining: No Scarring: No Mottled: No Pallor: No Moisture Rubor: No No Abnormalities Noted: No Dry / Scaly: No Maceration: No Treatment Notes Wound #1 (Chest) Wound Laterality: Left Cleanser Soap and Water Discharge Instruction: May shower and wash wound with  dial antibacterial soap and water prior to dressing change. Wound Cleanser Discharge Instruction: Cleanse the wound with wound cleanser prior to applying a clean dressing using gauze sponges, not  tissue or cotton balls. Peri-Wound Care Skin Prep Discharge Instruction: Use skin prep as directed Zinc Oxide Ointment 30g tube Discharge Instruction: Apply Zinc Oxide to periwound with each dressing change due to skin irritation Topical Primary Dressing vashe wet to dry Discharge Instruction: apply soaked vashe gauze directly to wound bed. Secondary Dressing ABD Pad, 8x10 Discharge Instruction: Apply over primary dressing as directed. Secured With Compression Wrap Compression Stockings Environmental education officer) Signed: 08/29/2022 4:12:57 PM By: Erenest Blank Signed: 08/30/2022 6:17:04 PM By: Deon Pilling RN, BSN Entered By: Erenest Blank on 08/29/2022 13:51:21 -------------------------------------------------------------------------------- Vitals Details Patient Name: Date of Service: [name], [name] 08/29/2022 1:30 PM Medical Record Number: [id number] Patient Account Number: [id number] Date of Birth/Sex: Treating RN: 1985-09-08 (37 y.o. [name] Primary Care Jaydrian Corpening: Vena Rua Other Clinician: [name] ([id number]) [id number] Page 8 of 8 Referring Bonny Egger: Treating Amrom Ore/Extender: Salli Quarry Weeks in Treatment: 3 Vital Signs Time Taken: 13:44 Temperature (F): 99 Height (in): 69 Pulse (bpm): 103 Weight (lbs): 235 Respiratory Rate (breaths/min): 20 Body Mass Index (BMI): 34.7 Blood Pressure (mmHg): 142/93 Capillary Blood Glucose (mg/dl): 143 Reference Range: 80 - 120 mg / dl Electronic Signature(s) Signed: 08/30/2022 6:17:04 PM By: Deon Pilling RN, BSN Entered By: Deon Pilling on 08/29/2022 13:44:52

## 2022-08-31 NOTE — Progress Notes (Signed)
Caleb, [name] ([id number]) [id number] Page 1 of 8 Visit Report for 08/29/2022 Chief Complaint Document Details Patient Name: Date of Service: [name], [name]. 08/29/2022 1:30 PM Medical Record Number: [id number] Patient Account Number: [id number] Date of Birth/Sex: Treating RN: 04-17-1986 (37 y.o. M) Primary Care Provider: Edwin [name] Other Clinician: Referring Provider: Treating Provider/Extender: [name] in Treatment: 3 Information Obtained from: Patient Chief Complaint 1/52/23; patient is here predominantly for Caleb surgical wound in the left inframammary area extending into the left lateral chest. Patient also has Caleb small area on the right inframammary area probably related to hidradenitis Electronic Signature(s) Signed: 08/30/2022 9:05:03 AM By: Caleb Corwin DO Entered By: [name] on 08/29/2022 14:04:26 -------------------------------------------------------------------------------- HPI Details Patient Name: Date of Service: [name], [name]. 08/29/2022 1:30 PM Medical Record Number: [id number] Patient Account Number: [id number] Date of Birth/Sex: Treating RN: 1986-03-19 (37 y.o. M) Primary Care Provider: Edwin [name] Other Clinician: Referring Provider: Treating Provider/Extender: [name] in Treatment: 3 History of Present Illness HPI Description: ADMISSION 08/08/2021 This is Caleb 37 year old man who underwent an extensive hospitalization from 06/23/2022 through 07/13/2022 with Caleb large necrotic area on the left inframammary area extending into the left lateral chest. The patient states this came on fairly rapidly initially with Caleb boil. This was aspirated on 12/5 and he underwent an extensive operative debridement ultimately on 12/8. Cultures showed rare Prevotella and moderate actinomyces. He initially was treated with daptomycin and Unasyn in the hospital then Augmentin  and more recently he is continued on amoxicillin. [name] with [name] in Caleb few days. During the course of the hospitalization he was discovered to be Caleb new onset diabetic with Caleb hemoglobin A1c of 11.6. Since his discharge from the hospital he has been followed using Caleb wound VAC on the surgical area. There is an impressive postoperative picture in epic sewing Caleb large open surgical wound. There is been considerable improvement with the wound VAC being changed by home health. I cannot see the notes from either infectious disease or surgery Past medical history includes obesity, new onset diabetes as mentioned. He was Caleb smoker. 1/22; patient presents for follow-up. He has been using silver alginate 3 times weekly. He has some skin irritation to the left sided periwound. He had follow-up with infectious disease, [name] on 1/17. He is currently taking amoxicillin and plan is to continue this for the next 5 months. 1/29; patient presents for follow-up. He did not obtain Vashe solution. He has been using normal saline wet-to-dry dressings. He has no issues or complaints today. We gave him sample of Vashe in office. 2/5; patient presents for follow-up. [name] to the wound beds. He has had significant improvement in wound healing to the left surgical wound site. He has not heard from dermatology for referral sent. Electronic Signature(s) Signed: 08/30/2022 9:05:03 AM By: Caleb Corwin DO Entered By: [name] on 08/29/2022 14:05:07 [name] ([id number]) [id number] Page 2 of 8 -------------------------------------------------------------------------------- Chemical Cauterization Details Patient Name: Date of Service: [name], [name]. 08/29/2022 1:30 PM Medical Record Number: [id number] Patient Account Number: [id number] Date of Birth/Sex: Treating RN: January 01, 1986 (37 y.o. [name] Primary Care Provider:  Edwin [name] Other Clinician: Referring Provider: Treating Provider/Extender: [name] in Treatment: 3 Procedure Performed for: Wound #1 Left Chest Performed By: Physician Caleb Corwin, DO Post Procedure Diagnosis  Same as Pre-procedure Notes silver nitrate used. Electronic Signature(s) Signed: 08/30/2022 9:05:03 AM By: Kalman Shan DO Signed: 08/30/2022 6:17:04 PM By: Deon Pilling RN, BSN Entered By: Deon Pilling on 08/29/2022 14:01:53 -------------------------------------------------------------------------------- Physical Exam Details Patient Name: Date of Service: [name], [name]. 08/29/2022 1:30 PM Medical Record Number: [id number] Patient Account Number: [id number] Date of Birth/Sex: Treating RN: 06-Aug-1985 (37 y.o. M) Primary Care Provider: Vena [name] Other Clinician: Referring Provider: Treating Provider/Extender: Salli Quarry [name] in Treatment: 3 Constitutional respirations regular, non-labored and within target range for patient.. Cardiovascular 2+ dorsalis pedis/posterior tibialis pulses. Psychiatric pleasant and cooperative. Notes Narrowed wound to the left inframamillary area extending into the left lateral chest wall. This has hypergranulated tissue and granulated tissue with Areas of epithelization. No tunneling or undermining. T the right side under the breast there is Caleb small open area consistent with hidradenitis that tunnels at the 1 o'clock o position. No surrounding signs of infection. Electronic Signature(s) Signed: 08/30/2022 9:05:03 AM By: Kalman Shan DO Entered By: Kalman Shan on 08/29/2022 14:06:57 [name] ([id number]) [id number] Page 3 of 8 -------------------------------------------------------------------------------- Physician Orders Details Patient Name: Date of Service: [name], [name]. 08/29/2022 1:30 PM Medical Record Number:  [id number] Patient Account Number: [id number] Date of Birth/Sex: Treating RN: Nov 23, 1985 (37 y.o. [name] Primary Care Provider: Vena [name] Other Clinician: Referring Provider: Treating Provider/Extender: [name] in Treatment: 3 Verbal / Phone Orders: No Diagnosis Coding ICD-10 Coding Code Description T81.31XD Disruption of external operation (surgical) wound, not elsewhere classified, subsequent encounter L02.213 Cutaneous abscess of chest wall L98.498 Non-pressure chronic ulcer of skin of other sites with other specified severity L73.2 Hidradenitis suppurativa E11.622 Type 2 diabetes mellitus with other skin ulcer Follow-up Appointments ppointment in 2 [name]. - Dr. Heber [place] and [name] 130pm 09/12/2022 Return Caleb *****CANCEL 09/05/2022!!!!!******* Other: - [facility] [place] [phone] call them today. Anesthetic (In clinic) Topical Lidocaine 4% applied to wound bed Bathing/ Shower/ Hygiene May shower and wash wound with soap and water. Negative Presssure Wound Therapy Discontinue wound vac. Call the number on the machine for the company to pick up. Home Health No change in wound care orders this week; continue Home Health for wound care. May utilize formulary equivalent dressing for wound treatment orders unless otherwise specified. - x2 week wound care changes. wound center weekly. change to vashe wet to dry dressings to all wounds. Other Home Health Orders/Instructions: - [facility] Wound Treatment Wound #1 - Chest Wound Laterality: Left Cleanser: Soap and Water 3 x Per Week/30 Days Discharge Instructions: May shower and wash wound with dial antibacterial soap and water prior to dressing change. Cleanser: Wound Cleanser (Home Health) 3 x Per Week/30 Days Discharge Instructions: Cleanse the wound with wound cleanser prior to applying Caleb clean dressing using gauze sponges, not tissue or cotton  balls. Peri-Wound Care: Skin Prep (Home Health) 3 x Per Week/30 Days Discharge Instructions: Use skin prep as directed Peri-Wound Care: Zinc Oxide Ointment 30g tube (Home Health) 3 x Per Week/30 Days Discharge Instructions: Apply Zinc Oxide to periwound with each dressing change due to skin irritation Prim Dressing: vashe wet to dry (Home Health) 3 x Per Week/30 Days ary Discharge Instructions: apply soaked vashe gauze directly to wound bed. Secondary Dressing: ABD Pad, 8x10 (Home Health) 3 x Per Week/30 Days Discharge Instructions: Apply over primary dressing as directed. Wound #2 - Chest Wound Laterality: Right, Midline Cleanser: Soap and Water 3  x Per Week/30 Days Discharge Instructions: May shower and wash wound with dial antibacterial soap and water prior to dressing change. Cleanser: Wound Cleanser (Home Health) 3 x Per Week/30 Days Discharge Instructions: Cleanse the wound with wound cleanser prior to applying Caleb clean dressing using gauze sponges, not tissue or cotton balls. Peri-Wound Care: Skin Prep (Home Health) 3 x Per Week/30 Days Discharge Instructions: Use skin prep as directed Peri-Wound Care: Zinc Oxide Ointment 30g tube (Home Health) 3 x Per Week/30 Days Discharge Instructions: Apply Zinc Oxide to periwound with each dressing change due to skin irritation Prim Dressing: vashe wet to dry (Home Health) 3 x Per Week/30 Days ary Discharge Instructions: apply soaked vashe gauze directly to wound bed. [name], [name] ([id number]) [id number] Page 4 of 8 Secondary Dressing: ABD Pad, 8x10 (Home Health) 3 x Per Week/30 Days Discharge Instructions: Apply over primary dressing as directed. Electronic Signature(s) Signed: 08/30/2022 9:05:03 AM By: Kalman Shan DO Entered By: Kalman Shan on 08/29/2022 14:07:05 -------------------------------------------------------------------------------- Problem List Details Patient Name: Date of Service: [name],  [name]. 08/29/2022 1:30 PM Medical Record Number: [id number] Patient Account Number: [id number] Date of Birth/Sex: Treating RN: 05-28-1986 (37 y.o. [name] Primary Care Provider: Vena [name] Other Clinician: Referring Provider: Treating Provider/Extender: Salli Quarry [name] in Treatment: 3 Active Problems ICD-10 Encounter Code Description Active Date MDM Diagnosis T81.31XD Disruption of external operation (surgical) wound, not elsewhere classified, 08/08/2022 No Yes subsequent encounter L02.213 Cutaneous abscess of chest wall 08/08/2022 No Yes L98.498 Non-pressure chronic ulcer of skin of other sites with other specified severity 08/08/2022 No Yes L73.2 Hidradenitis suppurativa 08/08/2022 No Yes E11.622 Type 2 diabetes mellitus with other skin ulcer 08/08/2022 No Yes Inactive Problems Resolved Problems Electronic Signature(s) Signed: 08/30/2022 9:05:03 AM By: Kalman Shan DO Entered By: Kalman Shan on 08/29/2022 14:04:06 -------------------------------------------------------------------------------- Progress Note Details Patient Name: Date of Service: [name], [name]. 08/29/2022 1:30 PM Medical Record Number: [id number] Patient Account Number: [id number] [name], [name] ([id number]) [id number] Page 5 of 8 Date of Birth/Sex: Treating RN: 08/23/1985 (37 y.o. M) Primary Care Provider: Other Clinician: Vena [name] Referring Provider: Treating Provider/Extender: [name] in Treatment: 3 Subjective Chief Complaint Information obtained from Patient 1/52/23; patient is here predominantly for Caleb surgical wound in the left inframammary area extending into the left lateral chest. Patient also has Caleb small area on the right inframammary area probably related to hidradenitis History of Present Illness (HPI) ADMISSION 08/08/2021 This is Caleb 37 year old man who underwent an extensive  hospitalization from 06/23/2022 through 07/13/2022 with Caleb large necrotic area on the left inframammary area extending into the left lateral chest. The patient states this came on fairly rapidly initially with Caleb boil. This was aspirated on 12/5 and he underwent an extensive operative debridement ultimately on 12/8. Cultures showed rare Prevotella and moderate actinomyces. He initially was treated with daptomycin and Unasyn in the hospital then Augmentin and more recently he is continued on amoxicillin. [name] with Dr. Drucilla Schmidt in Caleb few days. During the course of the hospitalization he was discovered to be Caleb new onset diabetic with Caleb hemoglobin A1c of 11.6. Since his discharge from the hospital he has been followed using Caleb wound VAC on the surgical area. There is an impressive postoperative picture in epic sewing Caleb large open surgical wound. There is been considerable improvement with the wound VAC being changed by home health. I cannot see the notes from  either infectious disease or surgery Past medical history includes obesity, new onset diabetes as mentioned. He was Caleb smoker. 1/22; patient presents for follow-up. He has been using silver alginate 3 times weekly. He has some skin irritation to the left sided periwound. He had follow-up with infectious disease, [name] on 1/17. He is currently taking amoxicillin and plan is to continue this for the next 5 months. 1/29; patient presents for follow-up. He did not obtain Vashe solution. He has been using normal saline wet-to-dry dressings. He has no issues or complaints today. We gave him sample of Vashe in office. 2/5; patient presents for follow-up. [name] to the wound beds. He has had significant improvement in wound healing to the left surgical wound site. He has not heard from dermatology for referral sent. Patient History Family History Cancer - Maternal Grandparents,Paternal Grandparents, Diabetes  - Mother. Social History Current every day smoker - 1 ppd, Alcohol Use - Moderate. Medical History Respiratory Patient has history of Asthma Endocrine Patient has history of Type II Diabetes Hospitalization/Surgery History - 12/8 and12/10 IandD left breast abscess. Medical Caleb Surgical History Notes nd Constitutional Symptoms (General Health) breast abscess Psychiatric anxiety Objective Constitutional respirations regular, non-labored and within target range for patient.. Vitals Time Taken: 1:44 PM, Height: 69 in, Weight: 235 lbs, BMI: 34.7, Temperature: 99 F, Pulse: 103 bpm, Respiratory Rate: 20 breaths/min, Blood Pressure: 142/93 mmHg, Capillary Blood Glucose: 143 mg/dl. Cardiovascular 2+ dorsalis pedis/posterior tibialis pulses. Psychiatric pleasant and cooperative. General Notes: Narrowed wound to the left inframamillary area extending into the left lateral chest wall. This has hypergranulated tissue and granulated tissue with Areas of epithelization. No tunneling or undermining. T the right side under the breast there is Caleb small open area consistent with hidradenitis that tunnels at o the 1 o'clock position. No surrounding signs of infection. Integumentary (Hair, Skin) [name], [name] ([id number]) [phone].pdf Page 6 of 8 Wound #1 status is Open. Original cause of wound was Surgical Injury. The date acquired was: 07/03/2022. The wound has been in treatment 3 [name]. The wound is located on the Left Chest. The wound measures 1cm length x 13.5cm width x 0.1cm depth; 10.603cm^2 area and 1.06cm^3 volume. There is Fat Layer (Subcutaneous Tissue) exposed. There is no tunneling or undermining noted. There is Caleb medium amount of purulent drainage noted. The wound margin is distinct with the outline attached to the wound base. There is large (67-100%) red, pink, hyper - granulation within the wound bed. There is no necrotic tissue within the wound bed. The  periwound skin appearance did not exhibit: Callus, Crepitus, Excoriation, Induration, Rash, Scarring, Dry/Scaly, Maceration, Atrophie Blanche, Cyanosis, Ecchymosis, Hemosiderin Staining, Mottled, Pallor, Rubor, Erythema. Wound #2 status is Open. Original cause of wound was Gradually Appeared. The date acquired was: 08/08/2022. The wound has been in treatment 3 [name]. The wound is located on the Right,Midline Chest. The wound measures 0.4cm length x 0.2cm width x 0.7cm depth; 0.063cm^2 area and 0.044cm^3 volume. There is Fat Layer (Subcutaneous Tissue) exposed. There is no tunneling or undermining noted. There is Caleb medium amount of purulent drainage noted. The wound margin is distinct with the outline attached to the wound base. There is large (67-100%) red, pink, hyper - granulation within the wound bed. There is no necrotic tissue within the wound bed. The periwound skin appearance did not exhibit: Callus, Crepitus, Excoriation, Induration, Rash, Scarring, Dry/Scaly, Maceration, Atrophie Blanche, Cyanosis, Ecchymosis, Hemosiderin Staining, Mottled, Pallor, Rubor, Erythema. Assessment Active Problems  ICD-10 Disruption of external operation (surgical) wound, not elsewhere classified, subsequent encounter Cutaneous abscess of chest wall Non-pressure chronic ulcer of skin of other sites with other specified severity Hidradenitis suppurativa Type 2 diabetes mellitus with other skin ulcer Patient's left surgical wound site appears well-healing. I used silver nitrate to the hyper granulated areas. I recommended continuing Vashe wet-to-dry dressings. T the right side under the breast the small area that is consistent with hidradenitis is still present. I recommended continuing Vashe wet-to-dry o dressings here. He has not heard back from dermatology for consultation. Procedures Wound #1 Pre-procedure diagnosis of Wound #1 is an Open Surgical Wound located on the Left Chest . An Chemical Cauterization  procedure was performed by Kalman Shan, DO. Post procedure Diagnosis Wound #1: Same as Pre-Procedure Notes: silver nitrate used. Plan Follow-up Appointments: Return Appointment in 2 [name]. - Dr. Heber [place] and [name] 130pm 09/12/2022 *****CANCEL 09/05/2022!!!!!******* Other: - [facility] [place] [phone] call them today. Anesthetic: (In clinic) Topical Lidocaine 4% applied to wound bed Bathing/ Shower/ Hygiene: May shower and wash wound with soap and water. Negative Presssure Wound Therapy: Discontinue wound vac. Call the number on the machine for the company to pick up. Home Health: No change in wound care orders this week; continue Home Health for wound care. May utilize formulary equivalent dressing for wound treatment orders unless otherwise specified. - x2 week wound care changes. wound center weekly. change to vashe wet to dry dressings to all wounds. Other Home Health Orders/Instructions: - [facility] WOUND #1: - Chest Wound Laterality: Left Cleanser: Soap and Water 3 x Per Week/30 Days Discharge Instructions: May shower and wash wound with dial antibacterial soap and water prior to dressing change. Cleanser: Wound Cleanser (Home Health) 3 x Per Week/30 Days Discharge Instructions: Cleanse the wound with wound cleanser prior to applying Caleb clean dressing using gauze sponges, not tissue or cotton balls. Peri-Wound Care: Skin Prep (Home Health) 3 x Per Week/30 Days Discharge Instructions: Use skin prep as directed Peri-Wound Care: Zinc Oxide Ointment 30g tube (Home Health) 3 x Per Week/30 Days Discharge Instructions: Apply Zinc Oxide to periwound with each dressing change due to skin irritation Prim Dressing: vashe wet to dry (Home Health) 3 x Per Week/30 Days ary Discharge Instructions: apply soaked vashe gauze directly to wound bed. Secondary Dressing: ABD Pad, 8x10 (Home Health) 3 x Per Week/30 Days Discharge Instructions: Apply over primary  dressing as directed. WOUND #2: - Chest Wound Laterality: Right, Midline Cleanser: Soap and Water 3 x Per Week/30 Days Discharge Instructions: May shower and wash wound with dial antibacterial soap and water prior to dressing change. Cleanser: Wound Cleanser (Home Health) 3 x Per Week/30 Days Discharge Instructions: Cleanse the wound with wound cleanser prior to applying Caleb clean dressing using gauze sponges, not tissue or cotton balls. Peri-Wound Care: Skin Prep (Home Health) 3 x Per Week/30 Days Discharge Instructions: Use skin prep as directed Peri-Wound Care: Zinc Oxide Ointment 30g tube (Home Health) 3 x Per Week/30 Days Discharge Instructions: Apply Zinc Oxide to periwound with each dressing change due to skin irritation Prim Dressing: vashe wet to dry [facility]) 3 x Per Week/30 Days [name], [name] ([id number]) [id number] Page 7 of 8 Discharge Instructions: apply soaked vashe gauze directly to wound bed. Secondary Dressing: ABD Pad, 8x10 (Home Health) 3 x Per Week/30 Days Discharge Instructions: Apply over primary dressing as directed. 1. Vashe wet-to-dry dressings 2. Follow-up dermatology consult 3. Follow-up in 2 [name] Electronic Signature(s) Signed:  08/30/2022 9:05:03 AM By: Caleb Corwin DO Entered By: [name] on 08/29/2022 14:08:57 -------------------------------------------------------------------------------- HxROS Details Patient Name: Date of Service: [name], [name]. 08/29/2022 1:30 PM Medical Record Number: [id number] Patient Account Number: [id number] Date of Birth/Sex: Treating RN: August 11, 1985 (37 y.o. M) Primary Care Provider: Edwin [name] Other Clinician: Referring Provider: Treating Provider/Extender: [name] in Treatment: 3 Constitutional Symptoms (General Health) Medical History: Past Medical History Notes: breast abscess Respiratory Medical History: Positive for:  Asthma Endocrine Medical History: Positive for: Type II Diabetes Treated with: Insulin Psychiatric Medical History: Past Medical History Notes: anxiety Immunizations Implantable Devices No devices added Hospitalization / Surgery History Type of Hospitalization/Surgery 12/8 and12/10 IandD left breast abscess Family and Social History Cancer: Yes - Maternal Grandparents,Paternal Grandparents; Diabetes: Yes - Mother; Current every day smoker - 1 ppd; Alcohol Use: Moderate Electronic Signature(s) Signed: 08/30/2022 9:05:03 AM By: Caleb Corwin DO Entered By: [name] on 08/29/2022 14:05:14 [name] ([id number]) [id number] Page 8 of 8 -------------------------------------------------------------------------------- SuperBill Details Patient Name: Date of Service: [facility] [name], Coralie Common 08/29/2022 Medical Record Number: [id number] Patient Account Number: [id number] Date of Birth/Sex: Treating RN: 1985-08-03 (37 y.o. [name] Primary Care Provider: Edwin [name] Other Clinician: Referring Provider: Treating Provider/Extender: [name] in Treatment: 3 Diagnosis Coding ICD-10 Codes Code Description T81.31XD Disruption of external operation (surgical) wound, not elsewhere classified, subsequent encounter L02.213 Cutaneous abscess of chest wall L98.498 Non-pressure chronic ulcer of skin of other sites with other specified severity L73.2 Hidradenitis suppurativa E11.622 Type 2 diabetes mellitus with other skin ulcer Facility Procedures : 3 CPT4 Code: [id number] Description: 17250 - CHEM CAUT GRANULATION TISS ICD-10 Diagnosis Description L98.498 Non-pressure chronic ulcer of skin of other sites with other specified severity L02.213 Cutaneous abscess of chest wall T81.31XD Disruption of external operation  (surgical) wound, not elsewhere classified, subs Modifier: equent encounter Quantity: 1 Physician  Procedures : CPT4 Code Description Modifier [id number] - WC PHYS CHEM CAUT GRAN TISSUE ICD-10 Diagnosis Description L98.498 Non-pressure chronic ulcer of skin of other sites with other specified severity L02.213 Cutaneous abscess of chest wall T81.31XD  Disruption of external operation (surgical) wound, not elsewhere classified, subsequent encounter Quantity: 1 Electronic Signature(s) Signed: 08/30/2022 9:05:03 AM By: Caleb Corwin DO Entered By: [name] on 08/29/2022 14:09:40

## 2022-09-05 ENCOUNTER — Ambulatory Visit (HOSPITAL_BASED_OUTPATIENT_CLINIC_OR_DEPARTMENT_OTHER): Payer: No Typology Code available for payment source | Admitting: Internal Medicine

## 2022-09-09 ENCOUNTER — Other Ambulatory Visit: Payer: No Typology Code available for payment source

## 2022-09-09 DIAGNOSIS — I10 Essential (primary) hypertension: Secondary | ICD-10-CM

## 2022-09-10 LAB — BASIC METABOLIC PANEL
BUN/Creatinine Ratio: 11 (ref 9–20)
BUN: 8 mg/dL (ref 6–20)
CO2: 22 mmol/L (ref 20–29)
Calcium: 9.7 mg/dL (ref 8.7–10.2)
Chloride: 102 mmol/L (ref 96–106)
Creatinine, Ser: 0.74 mg/dL — ABNORMAL LOW (ref 0.76–1.27)
Glucose: 138 mg/dL — ABNORMAL HIGH (ref 70–99)
Potassium: 4.2 mmol/L (ref 3.5–5.2)
Sodium: 139 mmol/L (ref 134–144)
eGFR: 119 mL/min/{1.73_m2} (ref >59)

## 2022-09-12 ENCOUNTER — Encounter (HOSPITAL_BASED_OUTPATIENT_CLINIC_OR_DEPARTMENT_OTHER): Payer: No Typology Code available for payment source | Admitting: Internal Medicine

## 2022-09-12 DIAGNOSIS — E11622 Type 2 diabetes mellitus with other skin ulcer: Secondary | ICD-10-CM | POA: Diagnosis not present

## 2022-09-12 DIAGNOSIS — T8131XD Disruption of external operation (surgical) wound, not elsewhere classified, subsequent encounter: Secondary | ICD-10-CM | POA: Diagnosis not present

## 2022-09-12 DIAGNOSIS — L98498 Non-pressure chronic ulcer of skin of other sites with other specified severity: Secondary | ICD-10-CM

## 2022-09-12 DIAGNOSIS — L02213 Cutaneous abscess of chest wall: Secondary | ICD-10-CM | POA: Diagnosis not present

## 2022-09-12 NOTE — Progress Notes (Signed)
Stable labs , please continue current medications

## 2022-09-12 NOTE — Progress Notes (Signed)
[name], [name] ([id number]) [phone] [id number] Page 1 of 4 Visit Report for 08/08/2022 Abuse Risk Screen Details Patient Name: Date of Service: [name], [name] 08/08/2022 9:00 Caleb M Medical Record Number: [id number] Patient Account Number: [id number] Date of Birth/Sex: Treating RN: 07-25-1986 (37 y.o. [name], [name] Primary Care [name]: [name] Other Clinician: Referring [name]: Treating [name]/Extender: [name] in Treatment: 0 Abuse Risk Screen Items Answer ABUSE RISK SCREEN: Has anyone close to you tried to hurt or harm you recentlyo No Do you feel uncomfortable with anyone in your familyo No Has anyone forced you do things that you didnt want to doo No Electronic Signature(s) Signed: 09/12/2022 10:40:58 AM By: Caleb Hammock RN Entered By: [name] on 08/08/2022 09:22:33 -------------------------------------------------------------------------------- Activities of Daily Living Details Patient Name: Date of Service: [name], [name] 08/08/2022 9:00 Caleb M Medical Record Number: [id number] Patient Account Number: [id number] Date of Birth/Sex: Treating RN: 1986/04/04 (37 y.o. [name] Primary Care [name]: [name] Other Clinician: Referring [name]: Treating [name]/Extender: [name] Weeks in Treatment: 0 Activities of Daily Living Items Answer Activities of Daily Living (Please select one for each item) Drive Automobile Completely Able T Medications ake Completely Able Use T elephone Completely Able Care for Appearance Completely Able Use T oilet Completely Able Bath / Shower Completely Able Dress Self Completely Able Feed Self Completely Able Walk Completely Able Get In / Out Bed Completely Able Housework Completely Able Prepare Meals Completely [name] for Self Completely Able Electronic  Signature(s) Signed: 09/12/2022 10:40:58 AM By: Caleb Hammock RN Entered By: [name] on 08/08/2022 09:22:47 [name] ([id number]) [id number] Page 2 of 4 -------------------------------------------------------------------------------- Education Screening Details Patient Name: Date of Service: [name], [name] 08/08/2022 9:00 Caleb M Medical Record Number: [id number] Patient Account Number: [id number] Date of Birth/Sex: Treating RN: 10-Apr-1986 (37 y.o. [name], [name] Primary Care [name]: [name] Other Clinician: Referring [name]: Treating [name]/Extender: [name] in Treatment: 0 Primary Learner Assessed: Patient Learning Preferences/Education Level/Primary Language Learning Preference: Explanation, Demonstration, Communication Board, Printed Material Highest Education Level: High School Preferred Language: English Cognitive Barrier Language Barrier: No Translator Needed: No Memory Deficit: No Emotional Barrier: No Cultural/Religious Beliefs Affecting Medical Care: No Physical Barrier Impaired Vision: No Impaired Hearing: No Decreased Hand dexterity: No Knowledge/Comprehension Knowledge Level: High Comprehension Level: High Ability to understand written instructions: High Ability to understand verbal instructions: High Motivation Anxiety Level: Calm Cooperation: Cooperative Education Importance: Denies Need Interest in Health Problems: Asks Questions Perception: Coherent Willingness to Engage in Self-Management High Activities: Readiness to Engage in Self-Management High Activities: Electronic Signature(s) Signed: 09/12/2022 10:40:58 AM By: Caleb Hammock RN Entered By: [name] on 08/08/2022 09:23:10 -------------------------------------------------------------------------------- Fall Risk Assessment Details Patient Name: Date of Service: [name], [name] RO N  L. 08/08/2022 9:00 Caleb M Medical Record Number: [id number] Patient Account Number: [id number] Date of Birth/Sex: Treating RN: Sep 22, 1985 (37 y.o. [name] Primary Care [name]: [name] Other Clinician: Referring [name]: Treating [name]/Extender: [name] Weeks in Treatment: 0 Fall Risk Assessment Items Have you had 2 or more falls in the last 12 monthso 0 No [name] ([id number]) [id number] Page 3 of 4 Have you had any fall that resulted in injury in the last 12 monthso 0 No FALLS RISK SCREEN History of falling - immediate or within 3 months 0 No  Secondary diagnosis (Do you have 2 or more medical diagnoseso) 0 No Ambulatory aid None/bed rest/wheelchair/nurse 0 No Crutches/cane/walker 0 No Furniture 0 No Intravenous therapy Access/Saline/Heparin Lock 0 No Gait/Transferring Normal/ bed rest/ wheelchair 0 No Weak (short steps with or without shuffle, stooped but able to lift head while walking, may seek 0 No support from furniture) Impaired (short steps with shuffle, may have difficulty arising from chair, head down, impaired 0 No balance) Mental Status Oriented to own ability 0 No Electronic Signature(s) Signed: 09/12/2022 10:40:58 AM By: Caleb Hammock RN Entered By: [name] on 08/08/2022 09:23:14 -------------------------------------------------------------------------------- Foot Assessment Details Patient Name: Date of Service: [name], [name] 08/08/2022 9:00 Caleb M Medical Record Number: [id number] Patient Account Number: [id number] Date of Birth/Sex: Treating RN: 1986/02/08 (37 y.o. [name] Primary Care [name]: [name] Other Clinician: Referring [name]: Treating [name]/Extender: [name] Weeks in Treatment: 0 Foot Assessment Items Site Locations + = Sensation present, - = Sensation absent, C = Callus, U = Ulcer R =  Redness, W = Warmth, M = Maceration, PU = Pre-ulcerative lesion F = Fissure, S = Swelling, D = Dryness Assessment Right: Left: Other Deformity: No No Prior Foot Ulcer: No No Prior Amputation: No No Charcot Joint: No No Ambulatory Status: GaitDARRELD, [name] ([id number]) [id number] Page 4 of 4 Notes N/Caleb no LE wounds Electronic Signature(s) Signed: 09/12/2022 10:40:58 AM By: Caleb Hammock RN Entered By: [name] on 08/08/2022 09:23:29 -------------------------------------------------------------------------------- Nutrition Risk Screening Details Patient Name: Date of Service: [name], [name] 08/08/2022 9:00 Caleb M Medical Record Number: [id number] Patient Account Number: [id number] Date of Birth/Sex: Treating RN: 1986/03/15 (37 y.o. [name] Primary Care Ariez Neilan: [name] Other Clinician: Referring Justiss Gerbino: Treating Akshath Mccarey/Extender: [name] Weeks in Treatment: 0 Height (in): Weight (lbs): Body Mass Index (BMI): Nutrition Risk Screening Items Score Screening NUTRITION RISK SCREEN: I have an illness or condition that made me change the kind and/or amount of food I eat 0 No I eat fewer than two meals per day 0 No I eat few fruits and vegetables, or milk products 0 No I have three or more drinks of beer, liquor or wine almost every day 0 No I have tooth or mouth problems that make it hard for me to eat 0 No I don't always have enough money to buy the food I need 0 No I eat alone most of the time 0 No I take three or more different prescribed or over-the-counter drugs Caleb day 0 No Without wanting to, I have lost or gained 10 pounds in the last six months 0 No I am not always physically able to shop, cook and/or feed myself 0 No Nutrition Protocols Good Risk Protocol 0 No interventions needed Moderate Risk Protocol High Risk Proctocol Risk Level: Good Risk Score: 0 Electronic  Signature(s) Signed: 09/12/2022 10:40:58 AM By: Caleb Hammock RN Entered By: [name] on 08/08/2022 09:23:19

## 2022-09-12 NOTE — Progress Notes (Signed)
KENDON, BOOZER ([id number]) [phone].pdf Page 1 of 9 Visit Report for 08/08/2022 Allergy List Details Patient Name: Date of Service: [name], [name] 08/08/2022 9:00 Caleb M Medical Record Number: [id number] Patient Account Number: [id number] Date of Birth/Sex: Treating RN: [birth year] (37 y.o. [name] Primary Care Diona Peregoy: Vena Rua Other Clinician: Referring Ranya Fiddler: Treating Aceton Kinnear/Extender: Park Pope Weeks in Treatment: 0 Allergies Active Allergies peach Allergy Notes Electronic Signature(s) Signed: 08/08/2022 3:49:37 PM By: Deon Pilling RN, BSN Entered By: Deon Pilling on 08/08/2022 07:27:07 -------------------------------------------------------------------------------- Arrival Information Details Patient Name: Date of Service: [name], [name] 08/08/2022 9:00 Caleb M Medical Record Number: [id number] Patient Account Number: [id number] Date of Birth/Sex: Treating RN: 30-Mar-1986 (37 y.o. [name] Primary Care Lazaria Schaben: Vena Rua Other Clinician: Referring Jasime Westergren: Treating Huckleberry Martinson/Extender: Josem Kaufmann in Treatment: 0 Visit Information Patient Arrived: Ambulatory Arrival Time: 09:19 Accompanied By: self Transfer Assistance: None Patient Identification Verified: Yes Secondary Verification Process Completed: Yes Patient Requires Transmission-Based Precautions: No Patient Has Alerts: No Electronic Signature(s) Signed: 09/12/2022 10:40:58 AM By: Rhae Hammock RN Entered By: Rhae Hammock on 08/08/2022 09:21:46 -------------------------------------------------------------------------------- Clinic Level of Care Assessment Details Patient Name: Date of Service: [name], [name] 08/08/2022 9:00 Caleb M Medical Record Number: [id number] Patient Account Number: [id number] AVON, BRAHMBHATT ([id number]) [id number] Page 2 of 9 Date of  Birth/Sex: Treating RN: 1985/10/13 (37 y.o. [name] Primary Care Mayla Biddy: Other Clinician: Vena Rua Referring Odyn Turko: Treating Janara Klett/Extender: Josem Kaufmann in Treatment: 0 Clinic Level of Care Assessment Items TOOL 1 Quantity Score X- 1 0 Use when EandM and Procedure is performed on INITIAL visit ASSESSMENTS - Nursing Assessment / Reassessment X- 1 20 General Physical Exam (combine w/ comprehensive assessment (listed just below) when performed on new pt. evals) X- 1 25 Comprehensive Assessment (HX, ROS, Risk Assessments, Wounds Hx, etc.) ASSESSMENTS - Wound and Skin Assessment / Reassessment X- 1 10 Dermatologic / Skin Assessment (not related to wound area) ASSESSMENTS - Ostomy and/or Continence Assessment and Care []$  - 0 Incontinence Assessment and Management []$  - 0 Ostomy Care Assessment and Management (repouching, etc.) PROCESS - Coordination of Care []$  - 0 Simple Patient / Family Education for ongoing care X- 1 20 Complex (extensive) Patient / Family Education for ongoing care X- 1 10 Staff obtains Programmer, systems, Records, T Results / Process Orders est []$  - 0 Staff telephones HHA, Nursing Homes / Clarify orders / etc []$  - 0 Routine Transfer to another Facility (non-emergent condition) []$  - 0 Routine Hospital Admission (non-emergent condition) X- 1 15 New Admissions / Biomedical engineer / Ordering NPWT Apligraf, etc. , []$  - 0 Emergency Hospital Admission (emergent condition) PROCESS - Special Needs []$  - 0 Pediatric / Minor Patient Management []$  - 0 Isolation Patient Management []$  - 0 Hearing / Language / Visual special needs []$  - 0 Assessment of Community assistance (transportation, D/C planning, etc.) []$  - 0 Additional assistance / Altered mentation []$  - 0 Support Surface(s) Assessment (bed, cushion, seat, etc.) INTERVENTIONS - Miscellaneous []$  - 0 External ear exam []$  - 0 Patient Transfer (multiple  staff / Civil Service fast streamer / Similar devices) []$  - 0 Simple Staple / Suture removal (25 or less) []$  - 0 Complex Staple / Suture removal (26 or more) []$  - 0 Hypo/Hyperglycemic Management (do not check if billed separately) []$  - 0 Ankle / Brachial Index (ABI) - do not check if billed separately Has  the patient been seen at the hospital within the last three years: Yes Total Score: 100 Level Of Care: New/Established - Level 3 Electronic Signature(s) Signed: 08/08/2022 3:49:37 PM By: Deon Pilling RN, BSN Entered By: Deon Pilling on 08/08/2022 10:09:37 [name] ([id number]) [id number] Page 3 of 9 -------------------------------------------------------------------------------- Encounter Discharge Information Details Patient Name: Date of Service: [name], [name] 08/08/2022 9:00 Caleb M Medical Record Number: [id number] Patient Account Number: [id number] Date of Birth/Sex: Treating RN: 1986-02-03 (37 y.o. [name] Primary Care Laird Runnion: Vena Rua Other Clinician: Referring Garvin Ellena: Treating Mate Alegria/Extender: Josem Kaufmann in Treatment: 0 Encounter Discharge Information Items Discharge Condition: Stable Ambulatory Status: Ambulatory Discharge Destination: Home Transportation: Private Auto Accompanied By: self Schedule Follow-up Appointment: Yes Clinical Summary of Care: Electronic Signature(s) Signed: 08/08/2022 3:49:37 PM By: Deon Pilling RN, BSN Entered By: Deon Pilling on 08/08/2022 10:10:07 -------------------------------------------------------------------------------- Lower Extremity Assessment Details Patient Name: Date of Service: [name], [name] 08/08/2022 9:00 Caleb M Medical Record Number: [id number] Patient Account Number: [id number] Date of Birth/Sex: Treating RN: [birth year] (37 y.o. [name] Primary Care Kareemah Grounds: Vena Rua Other Clinician: Referring Oaklie Durrett: Treating  Shellye Zandi/Extender: Park Pope Weeks in Treatment: 0 Electronic Signature(s) Signed: 09/12/2022 10:40:58 AM By: Rhae Hammock RN Entered By: Rhae Hammock on 08/08/2022 09:23:34 -------------------------------------------------------------------------------- Multi Wound Chart Details Patient Name: Date of Service: [name], [name] 08/08/2022 9:00 Caleb M Medical Record Number: [id number] Patient Account Number: [id number] Date of Birth/Sex: Treating RN: 06-01-1986 (37 y.o. M) Primary Care Larrisha Babineau: Vena Rua Other Clinician: Referring Elvin Mccartin: Treating Jeramy Dimmick/Extender: Josem Kaufmann in Treatment: 0 Vital Signs Height(in): Capillary Blood Glucose(mg/dl): 132 Weight(lbs): Pulse(bpm): 114 Body Mass Index(BMI): Blood Pressure(mmHg): 150/105 Temperature(F): 98.2 Respiratory Rate(breaths/min): 17 [1:Photos:] [N/Caleb:N/Caleb [id number] Page 4 of 9] Left Chest Right, Midline Chest N/Caleb Wound Location: Surgical Injury Gradually Appeared N/Caleb Wounding Event: Open Surgical Wound Hidradenitis N/Caleb Primary Etiology: Asthma, Type II Diabetes Asthma, Type II Diabetes N/Caleb Comorbid History: 07/03/2022 08/08/2022 N/Caleb Date Acquired: 0 0 N/Caleb Weeks of Treatment: Open Open N/Caleb Wound Status: No No N/Caleb Wound Recurrence: 1.5x25x0.1 0.5x0.5x0.4 N/Caleb Measurements L x W x D (cm) 29.452 0.196 N/[name] (cm) : rea 2.945 0.079 N/Caleb Volume (cm) : Full Thickness Without Exposed Full Thickness Without Exposed N/Caleb Classification: Support Structures Support Structures Medium Medium N/Caleb Exudate Amount: Serosanguineous Serosanguineous N/Caleb Exudate Type: red, brown red, brown N/Caleb Exudate Color: Distinct, outline attached Distinct, outline attached N/Caleb Wound Margin: Large (67-100%) Large (67-100%) N/Caleb Granulation Amount: Red, Pink, Hyper-granulation Red, Pink, Hyper-granulation N/Caleb Granulation Quality: None Present  (0%) None Present (0%) N/Caleb Necrotic Amount: Fat Layer (Subcutaneous Tissue): Yes Fat Layer (Subcutaneous Tissue): Yes N/Caleb Exposed Structures: Fascia: No Fascia: No Tendon: No Tendon: No Muscle: No Muscle: No Joint: No Joint: No Bone: No Bone: No Medium (34-66%) None N/Caleb Epithelialization: Excoriation: No Excoriation: No N/Caleb Periwound Skin Texture: Induration: No Induration: No Callus: No Callus: No Crepitus: No Crepitus: No Rash: No Rash: No Scarring: No Scarring: No Maceration: No Maceration: No N/Caleb Periwound Skin Moisture: Dry/Scaly: No Dry/Scaly: No Atrophie Blanche: No Atrophie Blanche: No N/Caleb Periwound Skin Color: Cyanosis: No Cyanosis: No Ecchymosis: No Ecchymosis: No Erythema: No Erythema: No Hemosiderin Staining: No Hemosiderin Staining: No Mottled: No Mottled: No Pallor: No Pallor: No Rubor: No Rubor: No N/Caleb Chemical Cauterization N/Caleb Procedures Performed: Treatment Notes Wound #1 (Chest) Wound Laterality: Left Cleanser Soap and Water Discharge Instruction: May shower and  wash wound with dial antibacterial soap and water prior to dressing change. Wound Cleanser Discharge Instruction: Cleanse the wound with wound cleanser prior to applying Caleb clean dressing using gauze sponges, not tissue or cotton balls. Peri-Wound Care Skin Prep Discharge Instruction: Use skin prep as directed Topical Primary Dressing Sorbalgon AG Dressing, 4x4 (in/in) Discharge Instruction: Apply to wound bed as instructed Secondary Dressing ABD Pad, 8x10 Discharge Instruction: Apply over primary dressing as directed. Secured With Compression Wrap Compression Stockings Add-Ons CRIMSON, HELLE ([id number]) [id number] Page 5 of 9 Wound #2 (Chest) Wound Laterality: Right, Midline Cleanser Soap and Water Discharge Instruction: May shower and wash wound with dial antibacterial soap and water prior to dressing change. Wound Cleanser Discharge  Instruction: Cleanse the wound with wound cleanser prior to applying Caleb clean dressing using gauze sponges, not tissue or cotton balls. Peri-Wound Care Skin Prep Discharge Instruction: Use skin prep as directed Topical Primary Dressing Sorbalgon AG Dressing, 4x4 (in/in) Discharge Instruction: Lightly pack into wound bed. Secondary Dressing Zetuvit Plus Silicone Border Dressing 4x4 (in/in) Discharge Instruction: Apply silicone border over primary dressing as directed. Secured With Compression Wrap Compression Stockings Environmental education officer) Signed: 08/08/2022 3:35:21 PM By: Linton Ham MD Entered By: Linton Ham on 08/08/2022 10:11:16 -------------------------------------------------------------------------------- Multi-Disciplinary Care Plan Details Patient Name: Date of Service: Denice Paradise [name], [name] 08/08/2022 9:00 Caleb M Medical Record Number: [id number] Patient Account Number: [id number] Date of Birth/Sex: Treating RN: Aug 18, 1985 (37 y.o. [name] Primary Care Cherity Blickenstaff: Vena Rua Other Clinician: Referring Jerome Viglione: Treating Cayce Quezada/Extender: Josem Kaufmann in Treatment: 0 Active Inactive Orientation to the Wound Care Program Nursing Diagnoses: Knowledge deficit related to the wound healing center program Goals: Patient/caregiver will verbalize understanding of the [facility] Program Date Initiated: 08/08/2022 Target Resolution Date: 09/01/2022 Goal Status: Active Interventions: Provide education on orientation to the wound center Notes: Pain, Acute or Chronic Nursing Diagnoses: Pain, acute or chronic: actual or potential ADEMIR, BERENGUER ([id number]) [id number] Page 6 of 9 Potential alteration in comfort, pain Goals: Patient will verbalize adequate pain control and receive pain control interventions during procedures as needed Date Initiated: 08/08/2022 Target Resolution Date:  09/02/2022 Goal Status: Active Patient/caregiver will verbalize comfort level met Date Initiated: 08/08/2022 Target Resolution Date: 09/01/2022 Goal Status: Active Interventions: Encourage patient to take pain medications as prescribed Provide education on pain management Reposition patient for comfort Treatment Activities: Administer pain control measures as ordered : 08/08/2022 Notes: Wound/Skin Impairment Nursing Diagnoses: Knowledge deficit related to ulceration/compromised skin integrity Goals: Patient/caregiver will verbalize understanding of skin care regimen Date Initiated: 08/08/2022 Target Resolution Date: 09/01/2022 Goal Status: Active Interventions: Assess patient/caregiver ability to perform ulcer/skin care regimen upon admission and as needed Assess ulceration(s) every visit Provide education on ulcer and skin care Treatment Activities: Skin care regimen initiated : 08/08/2022 Topical wound management initiated : 08/08/2022 Notes: Electronic Signature(s) Signed: 08/08/2022 3:49:37 PM By: Deon Pilling RN, BSN Entered By: Deon Pilling on 08/08/2022 10:08:41 -------------------------------------------------------------------------------- Pain Assessment Details Patient Name: Date of Service: [name], [name] 08/08/2022 9:00 Caleb M Medical Record Number: [id number] Patient Account Number: [id number] Date of Birth/Sex: Treating RN: May 23, 1986 (37 y.o. [name] Primary Care Keifer Habib: Vena Rua Other Clinician: Referring Sallee Hogrefe: Treating Django Nguyen/Extender: Park Pope Weeks in Treatment: 0 Active Problems Location of Pain Severity and Description of Pain Patient Has Paino Yes Site Locations Pain Location: MCKENNON, MANGE ([id number]) [id number] Page 7 of 9 Pain Location: Pain in Ulcers  With Dressing Change: Yes Duration of the Pain. Constant / Intermittento Intermittent Rate the pain. Current Pain  Level: 4 Worst Pain Level: 10 Least Pain Level: 0 Tolerable Pain Level: 4 Character of Pain Describe the Pain: Aching Pain Management and Medication Current Pain Management: Medication: No Cold Application: No Rest: No Massage: No Activity: No T.E.N.S.: No Heat Application: No Leg drop or elevation: No Is the Current Pain Management Adequate: Adequate How does your wound impact your activities of daily livingo Sleep: No Bathing: No Appetite: No Relationship With Others: No Bladder Continence: No Emotions: No Bowel Continence: No Work: No Toileting: No Drive: No Dressing: No Hobbies: No Electronic Signature(s) Signed: 09/12/2022 10:40:58 AM By: Rhae Hammock RN Entered By: Rhae Hammock on 08/08/2022 09:31:44 -------------------------------------------------------------------------------- Patient/Caregiver Education Details Patient Name: Date of Service: Denice Paradise [name], [name] 1/15/2024andnbsp9:00 Caleb M Medical Record Number: [id number] Patient Account Number: [id number] Date of Birth/Gender: Treating RN: [birth year] (37 y.o. [name] Primary Care Physician: Vena Rua Other Clinician: Referring Physician: Treating Physician/Extender: Josem Kaufmann in Treatment: 0 Education Assessment Education Provided To: Patient Education Topics Provided Wound/Skin Impairment: Handouts: Caring for Your Ulcer Methods: Explain/Verbal Responses: Reinforcements needed Electronic Signature(s) Signed: 08/08/2022 3:49:37 PM By: Deon Pilling RN, BSN [name] ([id number]) [phone].pdf Page 8 of 9 Signed: 08/08/2022 3:49:37 PM By: Deon Pilling RN, BSN Entered By: Deon Pilling on 08/08/2022 10:09:10 -------------------------------------------------------------------------------- Wound Assessment Details Patient Name: Date of Service: [name], [name] 08/08/2022 9:00 Caleb M Medical Record Number:  [id number] Patient Account Number: [id number] Date of Birth/Sex: Treating RN: 1985-12-08 (37 y.o. M) Primary Care Wille Aubuchon: Vena Rua Other Clinician: Referring Lorianne Malbrough: Treating Madyx Delfin/Extender: Park Pope Weeks in Treatment: 0 Wound Status Wound Number: 1 Primary Etiology: Open Surgical Wound Wound Location: Left Chest Wound Status: Open Wounding Event: Surgical Injury Comorbid History: Asthma, Type II Diabetes Date Acquired: 07/03/2022 Weeks Of Treatment: 0 Clustered Wound: No Photos Wound Measurements Length: (cm) 1.5 Width: (cm) 25 Depth: (cm) 0.1 Area: (cm) 29.452 Volume: (cm) 2.945 % Reduction in Area: % Reduction in Volume: Epithelialization: Medium (34-66%) Tunneling: No Undermining: No Wound Description Classification: Full Thickness Without Exposed Suppo Wound Margin: Distinct, outline attached Exudate Amount: Medium Exudate Type: Serosanguineous Exudate Color: red, brown rt Structures Foul Odor After Cleansing: No Slough/Fibrino No Wound Bed Granulation Amount: Large (67-100%) Exposed Structure Granulation Quality: Red, Pink, Hyper-granulation Fascia Exposed: No Necrotic Amount: None Present (0%) Fat Layer (Subcutaneous Tissue) Exposed: Yes Tendon Exposed: No Muscle Exposed: No Joint Exposed: No Bone Exposed: No Periwound Skin Texture Texture Color No Abnormalities Noted: No No Abnormalities Noted: No Callus: No Atrophie Blanche: No Crepitus: No Cyanosis: No Excoriation: No Ecchymosis: No Induration: No Erythema: No Rash: No Hemosiderin Staining: No Scarring: No Mottled: No Pallor: No Moisture DRAKO, REINERS ([id number]) [id number] Page 9 of 9 Rubor: No No Abnormalities Noted: No Dry / Scaly: No Maceration: No Electronic Signature(s) Signed: 08/08/2022 3:49:37 PM By: Deon Pilling RN, BSN Entered By: Deon Pilling on 08/08/2022  10:04:49 -------------------------------------------------------------------------------- Vitals Details Patient Name: Date of Service: [name], [name] 08/08/2022 9:00 Caleb M Medical Record Number: [id number] Patient Account Number: [id number] Date of Birth/Sex: Treating RN: [birth year] (37 y.o. [name] Primary Care Cletis Clack: Vena Rua Other Clinician: Referring Leandria Thier: Treating Zubin Pontillo/Extender: Park Pope Weeks in Treatment: 0 Vital Signs Time Taken: 09:21 Temperature (F): 98.2 Pulse (bpm): 114 Respiratory Rate (breaths/min): 17 Blood Pressure (mmHg): 150/105 Capillary  Blood Glucose (mg/dl): 132 Reference Range: 80 - 120 mg / dl Electronic Signature(s) Signed: 09/12/2022 10:40:58 AM By: Rhae Hammock RN Entered By: Rhae Hammock on 08/08/2022 09:29:03

## 2022-09-12 NOTE — Progress Notes (Signed)
Caleb, [name] ([id number]) [id number] Page 1 of 8 Visit Report for 08/08/2022 Chief Complaint Document Details Patient Name: Date of Service: [name], [name] 08/08/2022 9:00 Caleb M Medical Record Number: [id number] Patient Account Number: [id number] Date of Birth/Sex: Treating RN: 11/30/1985 (37 y.o. M) Primary Care Provider: Vena [name] Other Clinician: Referring Provider: Treating Provider/Extender: [name] in Treatment: 0 Information Obtained from: Patient Chief Complaint 1/52/23; patient is here predominantly for Caleb surgical wound in the left inframammary area extending into the left lateral chest. Patient also has Caleb small area on the right inframammary area probably related to hidradenitis Electronic Signature(s) Signed: 08/08/2022 3:35:21 PM By: Caleb Ham MD Entered By: [name] on 08/08/2022 10:13:48 -------------------------------------------------------------------------------- HPI Details Patient Name: Date of Service: [name], [name] 08/08/2022 9:00 Caleb M Medical Record Number: [id number] Patient Account Number: [id number] Date of Birth/Sex: Treating RN: July 31, 1985 (37 y.o. M) Primary Care Provider: Vena [name] Other Clinician: Referring Provider: Treating Provider/Extender: [name] in Treatment: 0 History of Present Illness HPI Description: ADMISSION 08/08/2021 This is Caleb 37 year old man who underwent an extensive hospitalization from 06/23/2022 through 07/13/2022 with Caleb large necrotic area on the left inframammary area extending into the left lateral chest. The patient states this came on fairly rapidly initially with Caleb boil. This was aspirated on 12/5 and he underwent an extensive operative debridement ultimately on 12/8. Cultures showed rare Prevotella and moderate actinomyces. He initially was treated with daptomycin and Unasyn in the hospital then  Augmentin and more recently he is continued on amoxicillin. [name] with Dr. Drucilla [name] in Caleb few days. During the course of the hospitalization he was discovered to be Caleb new onset diabetic with Caleb hemoglobin A1c of 11.6. Since his discharge from the hospital he has been followed using Caleb wound VAC on the surgical area. There is an impressive postoperative picture in epic sewing Caleb large open surgical wound. There is been considerable improvement with the wound VAC being changed by home health. I cannot see the notes from either infectious disease or surgery Past medical history includes obesity, new onset diabetes as mentioned. He was Caleb smoker. Electronic Signature(s) Signed: 08/08/2022 3:35:21 PM By: Caleb Ham MD Entered By: [name] on 08/08/2022 10:18:58 [name] ([id number]) [id number] Page 2 of 8 -------------------------------------------------------------------------------- Physical Exam Details Patient Name: Date of Service: [name], [name] 08/08/2022 9:00 Caleb M Medical Record Number: [id number] Patient Account Number: [id number] Date of Birth/Sex: Treating RN: Jun 12, 1986 (37 y.o. M) Primary Care Provider: Vena [name] Other Clinician: Referring Provider: Treating Provider/Extender: [name] in Treatment: 0 Constitutional Patient is hypertensive.. Pulse regular and within target range for patient.Marland Kitchen Respirations regular, non-labored and within target range.. Temperature is normal and within the target range for the patient.Marland Kitchen Appears in no distress. Integumentary (Hair, Skin) In the left axilla there is scarring and pits but no active disease lesser changes on the right. This would be compatible with hidradenitis. Notes Wound exam His major area is in the left inframammary area extending into the left lateral chest wall. Most of this is still open but has very healthy granulation with no depth.  There is some surrounding induration but no palpable tenderness no crepitus no purulence. Compared to the initial pictures postoperatively in epic there is been Caleb tremendous improvement with Caleb wound VAC Almost as an aside he pointed to Caleb small area in the medial  right inframammary area. This is Caleb small pit with roughly 4 mm in depth. Electronic Signature(s) Signed: 08/08/2022 3:35:21 PM By: Caleb Ham MD Entered By: [name] on 08/08/2022 10:22:00 -------------------------------------------------------------------------------- Physician Orders Details Patient Name: Date of Service: [name], [name] 08/08/2022 9:00 Caleb M Medical Record Number: [id number] Patient Account Number: [id number] Date of Birth/Sex: Treating RN: [birth year] (37 y.o. [name] Primary Care Provider: Vena [name] Other Clinician: Referring Provider: Treating Provider/Extender: [name] in Treatment: 0 Verbal / Phone Orders: No Diagnosis Coding Follow-up Appointments ppointment in 1 week. - [name] [name] and [facility] 08/15/2022 1345 overflow room Return Caleb ppointment in 2 weeks. - [name] and [facility] 08/22/2022 1330 Return Caleb Other: - Infectious disease follow up 08/10/2022. Anesthetic (In clinic) Topical Lidocaine 4% applied to wound bed Bathing/ Shower/ Hygiene May shower and wash wound with soap and water. Negative Presssure Wound Therapy Discontinue wound vac. Call the number on the machine for the company to pick up. Home Health New wound care orders this week; continue Home Health for wound care. May utilize formulary equivalent dressing for wound treatment orders unless otherwise specified. - x2 week wound care changes. wound center weekly. silver alginate abd pad. discontinue wound vac. Other Home Health Orders/Instructions: - [facility] home [facility] Wound Treatment Wound #1 - Chest Wound Laterality: Left Cleanser: Soap and Water 3 x Per Week/30 Days Discharge  Instructions: May shower and wash wound with dial antibacterial soap and water prior to dressing change. Cleanser: Wound Cleanser (Home Health) 3 x Per Week/30 Days Discharge Instructions: Cleanse the wound with wound cleanser prior to applying Caleb clean dressing using gauze sponges, not tissue or cotton balls. Peri-Wound Care: Skin Prep (Home Health) 3 x Per Week/30 Days Discharge Instructions: Use skin prep as directed [name], [name] ([id number]) [id number] Page 3 of 8 Prim Dressing: Sorbalgon [employer], 4x4 (in/in) (Home Health) 3 x Per Week/30 Days ary Discharge Instructions: Apply to wound bed as instructed Secondary Dressing: ABD Pad, 8x10 ([place]) 3 x Per Week/30 Days Discharge Instructions: Apply over primary dressing as directed. Wound #2 - Chest Wound Laterality: Right, Midline Cleanser: Soap and Water 3 x Per Week/30 Days Discharge Instructions: May shower and wash wound with dial antibacterial soap and water prior to dressing change. Cleanser: Wound Cleanser (Home Health) 3 x Per Week/30 Days Discharge Instructions: Cleanse the wound with wound cleanser prior to applying Caleb clean dressing using gauze sponges, not tissue or cotton balls. Peri-Wound Care: Skin Prep (Home Health) 3 x Per Week/30 Days Discharge Instructions: Use skin prep as directed Prim Dressing: Sorbalgon AG Dressing, 4x4 (in/in) ([place]) 3 x Per Week/30 Days ary Discharge Instructions: Lightly pack into wound bed. Secondary Dressing: Zetuvit Plus Silicone Border Dressing 4x4 (in/in) 3 x Per Week/30 Days Discharge Instructions: Apply silicone border over primary dressing as directed. Patient Medications llergies: peach Caleb Notifications Medication Indication Start End for any debridements 08/08/2022 lidocaine DOSE topical 4 % cream - cream topical once daily Electronic Signature(s) Signed: 08/08/2022 3:35:21 PM By: Caleb Ham MD Signed: 08/08/2022 3:49:37 PM By: Deon Pilling RN, BSN Entered By: Deon Pilling on 08/08/2022 10:31:04 -------------------------------------------------------------------------------- Problem List Details Patient Name: Date of Service: [name], [name] 08/08/2022 9:00 Caleb M Medical Record Number: [id number] Patient Account Number: [id number] Date of Birth/Sex: Treating RN: [birth year] (37 y.o. M) Primary Care Provider: Vena [name] Other Clinician: Referring Provider: Treating Provider/Extender: Park Pope Weeks in Treatment: 0  Active Problems ICD-10 Encounter Code Description Active Date MDM Diagnosis T81.31XD Disruption of external operation (surgical) wound, not elsewhere classified, 08/08/2022 No Yes subsequent encounter L02.213 Cutaneous abscess of chest wall 08/08/2022 No Yes L98.498 Non-pressure chronic ulcer of skin of other sites with other specified severity 08/08/2022 No Yes L73.2 Hidradenitis suppurativa 08/08/2022 No Yes [name], [name] ([id number]) [id number] Page 4 of 8 E11.622 Type 2 diabetes mellitus with other skin ulcer 08/08/2022 No Yes Inactive Problems Resolved Problems Electronic Signature(s) Signed: 08/08/2022 3:35:21 PM By: Caleb Ham MD Entered By: [name] on 08/08/2022 10:22:41 -------------------------------------------------------------------------------- Progress Note Details Patient Name: Date of Service: [name], [name] 08/08/2022 9:00 Caleb M Medical Record Number: [id number] Patient Account Number: [id number] Date of Birth/Sex: Treating RN: 1985-12-14 (37 y.o. M) Primary Care Provider: Vena [name] Other Clinician: Referring Provider: Treating Provider/Extender: [name] in Treatment: 0 Subjective Chief Complaint Information obtained from Patient 1/52/23; patient is here predominantly for Caleb surgical wound in the left inframammary area extending into the left lateral chest. Patient also  has Caleb small area on the right inframammary area probably related to hidradenitis History of Present Illness (HPI) ADMISSION 08/08/2021 This is Caleb 37 year old man who underwent an extensive hospitalization from 06/23/2022 through 07/13/2022 with Caleb large necrotic area on the left inframammary area extending into the left lateral chest. The patient states this came on fairly rapidly initially with Caleb boil. This was aspirated on 12/5 and he underwent an extensive operative debridement ultimately on 12/8. Cultures showed rare Prevotella and moderate actinomyces. He initially was treated with daptomycin and Unasyn in the hospital then Augmentin and more recently he is continued on amoxicillin. [name] with Dr. Drucilla [name] in Caleb few days. During the course of the hospitalization he was discovered to be Caleb new onset diabetic with Caleb hemoglobin A1c of 11.6. Since his discharge from the hospital he has been followed using Caleb wound VAC on the surgical area. There is an impressive postoperative picture in epic sewing Caleb large open surgical wound. There is been considerable improvement with the wound VAC being changed by home health. I cannot see the notes from either infectious disease or surgery Past medical history includes obesity, new onset diabetes as mentioned. He was Caleb smoker. Patient History Allergies peach Family History Cancer - Maternal Grandparents,Paternal Grandparents, Diabetes - Mother. Social History Current every day smoker - 1 ppd, Alcohol Use - Moderate. Medical History Respiratory Patient has history of Asthma Endocrine Patient has history of Type II Diabetes Patient is treated with Insulin. Hospitalization/Surgery History - 12/8 and12/10 IandD left breast abscess. Medical Caleb Surgical History Notes nd Constitutional Symptoms [facility] breast abscess Psychiatric anxiety KYSEEM, LYNSKEY ([id number]) [id number] Page 5 of 8 Review of Systems  (ROS) Integumentary (Skin) Complains or has symptoms of Wounds - left breast. Objective Constitutional Patient is hypertensive.. Pulse regular and within target range for patient.Marland Kitchen Respirations regular, non-labored and within target range.. Temperature is normal and within the target range for the patient.Marland Kitchen Appears in no distress. Vitals Time Taken: 9:21 AM, Temperature: 98.2 F, Pulse: 114 bpm, Respiratory Rate: 17 breaths/min, Blood Pressure: 150/105 mmHg, Capillary Blood Glucose: 132 mg/dl. General Notes: Wound exam oo His major area is in the left inframammary area extending into the left lateral chest wall. Most of this is still open but has very healthy granulation with no depth. There is some surrounding induration but no palpable tenderness no crepitus no purulence. Compared to the initial pictures  postoperatively in epic there is been Caleb tremendous improvement with Caleb wound VAC oo Almost as an aside he pointed to Caleb small area in the medial right inframammary area. This is Caleb small pit with roughly 4 mm in depth. Integumentary (Hair, Skin) In the left axilla there is scarring and pits but no active disease lesser changes on the right. This would be compatible with hidradenitis. Wound #1 status is Open. Original cause of wound was Surgical Injury. The date acquired was: 07/03/2022. The wound is located on the Left Chest. The wound measures 1.5cm length x 25cm width x 0.1cm depth; 29.452cm^2 area and 2.945cm^3 volume. There is Fat Layer (Subcutaneous Tissue) exposed. There is no tunneling or undermining noted. There is Caleb medium amount of serosanguineous drainage noted. The wound margin is distinct with the outline attached to the wound base. There is large (67-100%) red, pink, hyper - granulation within the wound bed. There is no necrotic tissue within the wound bed. The periwound skin appearance did not exhibit: Callus, Crepitus, Excoriation, Induration, Rash, Scarring, Dry/Scaly,  Maceration, Atrophie Blanche, Cyanosis, Ecchymosis, Hemosiderin Staining, Mottled, Pallor, Rubor, Erythema. Wound #2 status is Open. Original cause of wound was Gradually Appeared. The date acquired was: 08/08/2022. The wound is located on the Right,Midline Chest. The wound measures 0.5cm length x 0.5cm width x 0.4cm depth; 0.196cm^2 area and 0.079cm^3 volume. There is Fat Layer (Subcutaneous Tissue) exposed. There is no tunneling or undermining noted. There is Caleb medium amount of serosanguineous drainage noted. The wound margin is distinct with the outline attached to the wound base. There is large (67-100%) red, pink, hyper - granulation within the wound bed. There is no necrotic tissue within the wound bed. The periwound skin appearance did not exhibit: Callus, Crepitus, Excoriation, Induration, Rash, Scarring, Dry/Scaly, Maceration, Atrophie Blanche, Cyanosis, Ecchymosis, Hemosiderin Staining, Mottled, Pallor, Rubor, Erythema. Assessment Active Problems ICD-10 Disruption of external operation (surgical) wound, not elsewhere classified, subsequent encounter Cutaneous abscess of chest wall Non-pressure chronic ulcer of skin of other sites with other specified severity Hidradenitis suppurativa Type 2 diabetes mellitus with other skin ulcer Procedures Wound #2 Pre-procedure diagnosis of Wound #2 is Caleb Hidradenitis located on the Right,Midline Chest . An Chemical Cauterization procedure was performed by Ricard Dillon., MD. Post procedure Diagnosis Wound #2: Same as Pre-Procedure Notes: silver nitrate used. Plan Follow-up Appointments: Return Appointment in 1 week. - [name] [place] and [facility] 08/15/2022 1345 overflow room Return Appointment in 2 weeks. - [name] [place] and [facility] 08/22/2022 1330 Other: - Infectious disease follow up 08/10/2022. Anesthetic: (In clinic) Topical Lidocaine 4% applied to wound bed Bathing/ Shower/ Hygiene: May shower and wash wound with soap and water. Negative  Presssure Wound Therapy: [name], [name] ([id number]) [id number] Page 6 of 8 Discontinue wound vac. Call the number on the machine for the company to pick up. Home Health: New wound care orders this week; continue Home Health for wound care. May utilize formulary equivalent dressing for wound treatment orders unless otherwise specified. - x2 week wound care changes. wound center weekly. silver alginate abd pad. discontinue wound vac. The following medication(s) was prescribed: lidocaine topical 4 % cream cream topical once daily for for any debridements was prescribed at facility WOUND #1: - Chest Wound Laterality: Left Cleanser: Soap and Water 3 x Per Week/30 Days Discharge Instructions: May shower and wash wound with dial antibacterial soap and water prior to dressing change. Cleanser: Wound Cleanser (Home Health) 3 x Per Week/30 Days Discharge Instructions: Cleanse the wound with wound  cleanser prior to applying Caleb clean dressing using gauze sponges, not tissue or cotton balls. Peri-Wound Care: Skin Prep (Home Health) 3 x Per Week/30 Days Discharge Instructions: Use skin prep as directed Prim Dressing: Sorbalgon AG Dressing, 4x4 (in/in) ([place]) 3 x Per Week/30 Days ary Discharge Instructions: Apply to wound bed as instructed Secondary Dressing: ABD Pad, 8x10 ([place]) 3 x Per Week/30 Days Discharge Instructions: Apply over primary dressing as directed. WOUND #2: - Chest Wound Laterality: Right, Midline Cleanser: Soap and Water 3 x Per Week/30 Days Discharge Instructions: May shower and wash wound with dial antibacterial soap and water prior to dressing change. Cleanser: Wound Cleanser (Home Health) 3 x Per Week/30 Days Discharge Instructions: Cleanse the wound with wound cleanser prior to applying Caleb clean dressing using gauze sponges, not tissue or cotton balls. Peri-Wound Care: Skin Prep (Home Health) 3 x Per Week/30 Days Discharge Instructions: Use  skin prep as directed Prim Dressing: Sorbalgon AG Dressing, 4x4 (in/in) ([place]) 3 x Per Week/30 Days ary Discharge Instructions: Lightly pack into wound bed. Secondary Dressing: Zetuvit Plus Silicone Border Dressing 4x4 (in/in) 3 x Per Week/30 Days Discharge Instructions: Apply silicone border over primary dressing as directed. 1. Surgical wound as described in the left chest with Caleb necrotizing soft tissue infection and abscess. Extensive surgical debridement. He had new onset diabetes during his hospitalization now on insulin 2. Fortunately with use of the wound VAC this is now superficial very narrow but still long in the surgical area. Granulation tissue looks very healthy 3. Culture of this area initially showed Prevotella and actinomyces. He has transition to amoxicillin that he is taking religiously. Has Caleb follow-up with Dr. Drucilla [name] shortly. 4. Fortunately this patient is done very well. I think the wound VAC can stop at this point and we will use silver alginate change daily. 5. Almost on this as an aside he showed [place] Caleb probing small area in the right inframammary area which looks like possibly hidradenitis however there is not Caleb lot of skin changes to suggest this chronically. On the other hand in the left greater than right axilla he clearly has significant hidradenitis. He feels that infection of the hidradenitis on the left ultimately led to his abscess and necrotizing infection 6. We are going to use silver alginate in both wound areas. The patient will change his daily he can wash these off with soap and water. 7. With regards to his hidradenitis I told him that this was Caleb dermatologic disease. If he develops activity here hopefully he can get into see dermatology. Electronic Signature(s) Signed: 08/08/2022 3:35:21 PM By: Caleb Ham MD Entered By: [name] on 08/08/2022 10:26:15 -------------------------------------------------------------------------------- HxROS  Details Patient Name: Date of Service: [name], [name] 08/08/2022 9:00 Caleb M Medical Record Number: [id number] Patient Account Number: [id number] Date of Birth/Sex: Treating RN: February 01, 1986 (37 y.o. [name] Primary Care Provider: Vena [name] Other Clinician: Referring Provider: Treating Provider/Extender: Park Pope Weeks in Treatment: 0 Integumentary (Skin) Complaints and Symptoms: Positive for: Wounds - left breast Constitutional Symptoms (General Health) Medical History: Past Medical History Notes: breast abscess Respiratory Medical History: Positive for: Asthma Endocrine [name], [name] ([id number]) [id number] Page 7 of 8 Medical History: Positive for: Type II Diabetes Treated with: Insulin Psychiatric Medical History: Past Medical History Notes: anxiety Immunizations Implantable Devices No devices added Hospitalization / Surgery History Type of Hospitalization/Surgery 12/8 and12/10 IandD left breast abscess Family and Social History Cancer: Yes -  Maternal Grandparents,Paternal Grandparents; Diabetes: Yes - Mother; Current every day smoker - 1 ppd; Alcohol Use: Moderate Electronic Signature(s) Signed: 08/08/2022 3:35:21 PM By: Caleb Ham MD Signed: 08/08/2022 3:49:37 PM By: Deon Pilling RN, BSN Signed: 09/12/2022 10:40:58 AM By: Rhae Hammock RN Entered By: Rhae Hammock on 08/08/2022 09:22:20 -------------------------------------------------------------------------------- SuperBill Details Patient Name: Date of Service: [name], [name] 08/08/2022 Medical Record Number: [id number] Patient Account Number: [id number] Date of Birth/Sex: Treating RN: Dec 02, 1985 (37 y.o. M) Primary Care Provider: Vena [name] Other Clinician: Referring Provider: Treating Provider/Extender: [name] in Treatment: 0 Diagnosis Coding ICD-10 Codes Code Description T81.31XD  Disruption of external operation (surgical) wound, not elsewhere classified, subsequent encounter L02.213 Cutaneous abscess of chest wall L98.498 Non-pressure chronic ulcer of skin of other sites with other specified severity L73.2 Hidradenitis suppurativa E11.622 Type 2 diabetes mellitus with other skin ulcer Facility Procedures : CPT4 Code: [id number] Description: 99213 - WOUND CARE VISIT-LEV 3 EST PT Modifier: Quantity: 1 : CPT4 Code: [id number] Description: [id number] - CHEM CAUT GRANULATION TISS ICD-10 Diagnosis Description L02.213 Cutaneous abscess of chest wall E11.622 Type 2 diabetes mellitus with other skin ulcer L98.498 Non-pressure chronic ulcer of skin of other sites with other specified  severity Modifier: Quantity: 1 Physician Procedures : CPT4 Code Description Modifier [id number] - WC PHYS LEVEL 4 - NEW PT [name], [name] ([id number]) [id number] ICD-10 Diagnosis Description T81.31XD Disruption of external operation (surgical) wound, not elsewhere classified,  subsequent encounter L02.213 Cutaneous abscess of chest wall L98.498 Non-pressure chronic ulcer of skin of other sites with other specified severity L73.2 Hidradenitis suppurativa Quantity: 1 .pdf Page 8 of 8 : [id number] - WC PHYS CHEM CAUT GRAN TISSUE 1 ICD-10 Diagnosis Description L02.213 Cutaneous abscess of chest wall E11.622 Type 2 diabetes mellitus with other skin ulcer L98.498 Non-pressure chronic ulcer of skin of other sites with other specified  severity Quantity: Electronic Signature(s) Signed: 08/08/2022 3:35:21 PM By: Caleb Ham MD Signed: 08/08/2022 3:49:37 PM By: Deon Pilling RN, BSN Entered By: Deon Pilling on 08/08/2022 10:31:39

## 2022-09-14 NOTE — Progress Notes (Signed)
DEIONTAY, PAPENDICK ([id number]) [id number].pdf Page 1 of 8 Visit Report for 09/12/2022 Arrival Information Details Patient Name: Date of Service: [facility] [name], [name] 09/12/2022 1:30 PM Medical Record Number: [id number] Patient Account Number: [id number] Date of Birth/Sex: Treating RN: Jul 19, 1986 (37 y.o. Lorette Ang, Meta.Reding Primary Care Cymone Yeske: Vena Rua Other Clinician: Referring Tuwana Kapaun: Treating Teja Costen/Extender: Jhonnie Garner in Treatment: 5 Visit Information History Since Last Visit Added or deleted any medications: No Patient Arrived: Ambulatory Any new allergies or adverse reactions: No Arrival Time: 13:42 Had a fall or experienced change in No Accompanied By: self activities of daily living that may affect Transfer Assistance: None risk of falls: Patient Identification Verified: Yes Signs or symptoms of abuse/neglect since last visito No Secondary Verification Process Completed: Yes Hospitalized since last visit: No Patient Requires Transmission-Based Precautions: No Implantable device outside of the clinic excluding No Patient Has Alerts: No cellular tissue based products placed in the center since last visit: Has Dressing in Place as Prescribed: Yes Pain Present Now: No Electronic Signature(s) Signed: 09/13/2022 4:38:15 PM By: Erenest Blank Entered By: Erenest Blank on 09/12/2022 13:43:29 -------------------------------------------------------------------------------- Clinic Level of Care Assessment Details Patient Name: Date of Service: [facility] [name], [name] 09/12/2022 1:30 PM Medical Record Number: [id number] Patient Account Number: [id number] Date of Birth/Sex: Treating RN: [birth year] (37 y.o. [name] Primary Care Caeleigh Prohaska: Vena Rua Other Clinician: Referring Isiah Scheel: Treating Chastity Noland/Extender: Jhonnie Garner in Treatment: 5 Clinic Level of Care Assessment Items TOOL  4 Quantity Score X- 1 0 Use when only an EandM is performed on FOLLOW-UP visit ASSESSMENTS - Nursing Assessment / Reassessment X- 1 10 Reassessment of Co-morbidities (includes updates in patient status) X- 1 5 Reassessment of Adherence to Treatment Plan ASSESSMENTS - Wound and Skin A ssessment / Reassessment X - Simple Wound Assessment / Reassessment - one wound 1 5 []$  - 0 Complex Wound Assessment / Reassessment - multiple wounds X- 1 10 Dermatologic / Skin Assessment (not related to wound area) ASSESSMENTS - Focused Assessment []$  - 0 Circumferential Edema Measurements - multi extremities []$  - 0 Nutritional Assessment / Counseling / Intervention AMONDRE, LASEK ([id number]) [phone] Page 2 of 8 []$  - 0 Lower Extremity Assessment (monofilament, tuning fork, pulses) []$  - 0 Peripheral Arterial Disease Assessment (using hand held doppler) ASSESSMENTS - Ostomy and/or Continence Assessment and Care []$  - 0 Incontinence Assessment and Management []$  - 0 Ostomy Care Assessment and Management (repouching, etc.) PROCESS - Coordination of Care X - Simple Patient / Family Education for ongoing care 1 15 []$  - 0 Complex (extensive) Patient / Family Education for ongoing care X- 1 10 Staff obtains Programmer, systems, Records, T Results / Process Orders est X- 1 10 Staff telephones HHA, Nursing Homes / Clarify orders / etc []$  - 0 Routine Transfer to another Facility (non-emergent condition) []$  - 0 Routine Hospital Admission (non-emergent condition) []$  - 0 New Admissions / Biomedical engineer / Ordering NPWT Apligraf, etc. , []$  - 0 Emergency Hospital Admission (emergent condition) X- 1 10 Simple Discharge Coordination []$  - 0 Complex (extensive) Discharge Coordination PROCESS - Special Needs []$  - 0 Pediatric / Minor Patient Management []$  - 0 Isolation Patient Management []$  - 0 Hearing / Language / Visual special needs []$  - 0 Assessment of Community  assistance (transportation, D/C planning, etc.) []$  - 0 Additional assistance / Altered mentation []$  - 0 Support Surface(s) Assessment (bed, cushion, seat, etc.) INTERVENTIONS - Wound Cleansing / Measurement X -  Simple Wound Cleansing - one wound 1 5 []$  - 0 Complex Wound Cleansing - multiple wounds X- 1 5 Wound Imaging (photographs - any number of wounds) []$  - 0 Wound Tracing (instead of photographs) X- 1 5 Simple Wound Measurement - one wound []$  - 0 Complex Wound Measurement - multiple wounds INTERVENTIONS - Wound Dressings X - Small Wound Dressing one or multiple wounds 1 10 []$  - 0 Medium Wound Dressing one or multiple wounds []$  - 0 Large Wound Dressing one or multiple wounds []$  - 0 Application of Medications - topical []$  - 0 Application of Medications - injection INTERVENTIONS - Miscellaneous []$  - 0 External ear exam []$  - 0 Specimen Collection (cultures, biopsies, blood, body fluids, etc.) []$  - 0 Specimen(s) / Culture(s) sent or taken to Lab for analysis []$  - 0 Patient Transfer (multiple staff / Civil Service fast streamer / Similar devices) []$  - 0 Simple Staple / Suture removal (25 or less) []$  - 0 Complex Staple / Suture removal (26 or more) []$  - 0 Hypo / Hyperglycemic Management (close monitor of Blood Glucose) CODEE, TOMKIEWICZ ([id number].pdf Page 3 of 8 []$  - 0 Ankle / Brachial Index (ABI) - do not check if billed separately X- 1 5 Vital Signs Has the patient been seen at the hospital within the last three years: Yes Total Score: 105 Level Of Care: New/Established - Level 3 Electronic Signature(s) Signed: 09/13/2022 4:50:49 PM By: Deon Pilling RN, BSN Entered By: Deon Pilling on 09/12/2022 14:09:23 -------------------------------------------------------------------------------- Encounter Discharge Information Details Patient Name: Date of Service: [name], [name] 09/12/2022 1:30 PM Medical Record Number: [id number] Patient Account  Number: [id number] Date of Birth/Sex: Treating RN: September 19, 1985 (37 y.o. [name] Primary Care Sudiksha Victor: Vena Rua Other Clinician: Referring Rylyn Ranganathan: Treating Lynn Recendiz/Extender: Jhonnie Garner in Treatment: 5 Encounter Discharge Information Items Discharge Condition: Stable Ambulatory Status: Ambulatory Discharge Destination: Home Transportation: Private Auto Accompanied By: self Schedule Follow-up Appointment: Yes Clinical Summary of Care: Electronic Signature(s) Signed: 09/13/2022 4:50:49 PM By: Deon Pilling RN, BSN Entered By: Deon Pilling on 09/12/2022 14:09:56 -------------------------------------------------------------------------------- Lower Extremity Assessment Details Patient Name: Date of Service: [name], [name] 09/12/2022 1:30 PM Medical Record Number: [id number] Patient Account Number: [id number] Date of Birth/Sex: Treating RN: [birth year] (37 y.o. [name] Primary Care Nili Honda: Vena Rua Other Clinician: Referring Clever Geraldo: Treating Darcelle Herrada/Extender: Salli Quarry Weeks in Treatment: 5 Electronic Signature(s) Signed: 09/13/2022 4:38:15 PM By: Erenest Blank Signed: 09/13/2022 4:50:49 PM By: Deon Pilling RN, BSN Entered By: Erenest Blank on 09/12/2022 13:45:23 -------------------------------------------------------------------------------- Multi Wound Chart Details Patient Name: Date of Service: [name], [name] 09/12/2022 1:30 PM [name] ([id number] Page 4 of 8 Medical Record Number: [id number] Patient Account Number: [id number] Date of Birth/Sex: Treating RN: 06/28/1986 (37 y.o. [name] Primary Care Renai Lopata: Vena Rua Other Clinician: Referring Tasean Mancha: Treating Caia Lofaro/Extender: Salli Quarry Weeks in Treatment: 5 Vital Signs Height(in): 69 Capillary Blood Glucose(mg/dl): 126 Weight(lbs):  235 Pulse(bpm): 96 Body Mass Index(BMI): 34.7 Blood Pressure(mmHg): 147/94 Temperature(F): 98.7 Respiratory Rate(breaths/min): 18 [1:Photos:] [N/A:N/A] Left Chest N/A N/A Wound Location: Surgical Injury N/A N/A Wounding Event: Open Surgical Wound N/A N/A Primary Etiology: Asthma, Type II Diabetes N/A N/A Comorbid History: 07/03/2022 N/A N/A Date Acquired: 5 N/A N/A Weeks of Treatment: Open N/A N/A Wound Status: No N/A N/A Wound Recurrence: Yes N/A N/A Clustered Wound: 3 N/A N/A Clustered Quantity: 0.5x1.5x0.1 N/A N/A Measurements L  x W x D (cm) 0.589 N/A N/A A (cm) : rea 0.059 N/A N/A Volume (cm) : 98.00% N/A N/A % Reduction in A rea: 98.00% N/A N/A % Reduction in Volume: Full Thickness Without Exposed N/A N/A Classification: Support Structures Medium N/A N/A Exudate Amount: Purulent N/A N/A Exudate Type: yellow, brown, green N/A N/A Exudate Color: Distinct, outline attached N/A N/A Wound Margin: Large (67-100%) N/A N/A Granulation Amount: Red, Pink, Hyper-granulation N/A N/A Granulation Quality: None Present (0%) N/A N/A Necrotic Amount: Fat Layer (Subcutaneous Tissue): Yes N/A N/A Exposed Structures: Fascia: No Tendon: No Muscle: No Joint: No Bone: No Medium (34-66%) N/A N/A Epithelialization: Excoriation: No N/A N/A Periwound Skin Texture: Induration: No Callus: No Crepitus: No Rash: No Scarring: No Maceration: No N/A N/A Periwound Skin Moisture: Dry/Scaly: No Atrophie Blanche: No N/A N/A Periwound Skin Color: Cyanosis: No Ecchymosis: No Erythema: No Hemosiderin Staining: No Mottled: No Pallor: No Rubor: No HANIEL, MYSLINSKI ([id number]) [phone].pdf Page 5 of 8 Treatment Notes Electronic Signature(s) Signed: 09/12/2022 3:39:58 PM By: Kalman Shan DO Signed: 09/13/2022 4:50:49 PM By: Deon Pilling RN, BSN Entered By: Kalman Shan on 09/12/2022  14:09:01 -------------------------------------------------------------------------------- Multi-Disciplinary Care Plan Details Patient Name: Date of Service: [name], [name] 09/12/2022 1:30 PM Medical Record Number: [id number] Patient Account Number: [id number] Date of Birth/Sex: Treating RN: 12-09-1985 (37 y.o. [name] Primary Care Shauntae Reitman: Vena Rua Other Clinician: Referring Annalisia Ingber: Treating Rodric Punch/Extender: Salli Quarry Weeks in Treatment: 5 Active Inactive Pain, Acute or Chronic Nursing Diagnoses: Pain, acute or chronic: actual or potential Potential alteration in comfort, pain Goals: Patient will verbalize adequate pain control and receive pain control interventions during procedures as needed Date Initiated: 08/08/2022 Target Resolution Date: 09/23/2022 Goal Status: Active Patient/caregiver will verbalize comfort level met Date Initiated: 08/08/2022 Target Resolution Date: 09/23/2022 Goal Status: Active Interventions: Encourage patient to take pain medications as prescribed Provide education on pain management Reposition patient for comfort Treatment Activities: Administer pain control measures as ordered : 08/08/2022 Notes: Wound/Skin Impairment Nursing Diagnoses: Knowledge deficit related to ulceration/compromised skin integrity Goals: Patient/caregiver will verbalize understanding of skin care regimen Date Initiated: 08/08/2022 Target Resolution Date: 09/23/2022 Goal Status: Active Interventions: Assess patient/caregiver ability to perform ulcer/skin care regimen upon admission and as needed Assess ulceration(s) every visit Provide education on ulcer and skin care Treatment Activities: Skin care regimen initiated : 08/08/2022 Topical wound management initiated : 08/08/2022 Notes: Electronic Signature(s) Signed: 09/13/2022 4:50:49 PM By: Deon Pilling RN, BSN [name] ([id number]) [phone].pdf  Page 6 of 8 Signed: 09/13/2022 4:50:49 PM By: Deon Pilling RN, BSN Entered By: Deon Pilling on 09/12/2022 14:08:38 -------------------------------------------------------------------------------- Pain Assessment Details Patient Name: Date of Service: [name], [name] 09/12/2022 1:30 PM Medical Record Number: [id number] Patient Account Number: [id number] Date of Birth/Sex: Treating RN: April 01, 1986 (37 y.o. [name] Primary Care Kegan Shepardson: Vena Rua Other Clinician: Referring Nyasia Baxley: Treating Jameriah Trotti/Extender: Salli Quarry Weeks in Treatment: 5 Active Problems Location of Pain Severity and Description of Pain Patient Has Paino No Site Locations Pain Management and Medication Current Pain Management: Notes Pain at times first thing in the morning Electronic Signature(s) Signed: 09/13/2022 4:38:15 PM By: Erenest Blank Signed: 09/13/2022 4:50:49 PM By: Deon Pilling RN, BSN Entered By: Erenest Blank on 09/12/2022 13:45:16 -------------------------------------------------------------------------------- Wound Assessment Details Patient Name: Date of Service: [name], [name] 09/12/2022 1:30 PM Medical Record Number: [id number] Patient Account Number: [id number] Date of Birth/Sex: Treating RN: [birth year] (  37 y.o. [name] Primary Care Asheton Viramontes: Vena Rua Other Clinician: Referring Loraina Stauffer: Treating Taelyn Broecker/Extender: Salli Quarry Weeks in Treatment: 5 Wound Status Wound Number: 1 Primary Etiology: Open Surgical Wound Wound Location: Left Chest Wound Status: Open Wounding Event: Surgical Injury Comorbid History: Asthma, Type II Diabetes LOVETT, BENDA ([id number]) [phone].pdf Page 7 of 8 Date Acquired: 07/03/2022 Weeks Of Treatment: 5 Clustered Wound: Yes Photos Wound Measurements Length: (cm) 0.5 Width: (cm) 1.5 Depth: (cm) 0.1 Clustered Quantity: 3 Area: (cm)  0.58 Volume: (cm) 0.05 % Reduction in Area: 98% % Reduction in Volume: 98% Epithelialization: Medium (34-66%) 9 9 Wound Description Classification: Full Thickness Without Exposed Sup Wound Margin: Distinct, outline attached Exudate Amount: Medium Exudate Type: Purulent Exudate Color: yellow, brown, green port Structures Foul Odor After Cleansing: No Slough/Fibrino No Wound Bed Granulation Amount: Large (67-100%) Exposed Structure Granulation Quality: Red, Pink, Hyper-granulation Fascia Exposed: No Necrotic Amount: None Present (0%) Fat Layer (Subcutaneous Tissue) Exposed: Yes Tendon Exposed: No Muscle Exposed: No Joint Exposed: No Bone Exposed: No Periwound Skin Texture Texture Color No Abnormalities Noted: No No Abnormalities Noted: No Callus: No Atrophie Blanche: No Crepitus: No Cyanosis: No Excoriation: No Ecchymosis: No Induration: No Erythema: No Rash: No Hemosiderin Staining: No Scarring: No Mottled: No Pallor: No Moisture Rubor: No No Abnormalities Noted: No Dry / Scaly: No Maceration: No Treatment Notes Wound #1 (Chest) Wound Laterality: Left Cleanser Soap and Water Discharge Instruction: May shower and wash wound with dial antibacterial soap and water prior to dressing change. Wound Cleanser Discharge Instruction: Cleanse the wound with wound cleanser prior to applying a clean dressing using gauze sponges, not tissue or cotton balls. Peri-Wound Care Skin Prep Discharge Instruction: Use skin prep as directed Zinc Oxide Ointment 30g tube Discharge Instruction: Apply Zinc Oxide to periwound with each dressing change due to skin irritation ERIEL, CHAVEZ ([id number]) [phone].pdf Page 8 of 8 Topical Primary Dressing vashe wet to dry Discharge Instruction: apply soaked vashe gauze directly to wound bed. Secondary Dressing ABD Pad, 8x10 Discharge Instruction: Apply over primary dressing as directed. Secured  With Compression Wrap Compression Stockings Environmental education officer) Signed: 09/13/2022 4:38:15 PM By: Erenest Blank Signed: 09/13/2022 4:50:49 PM By: Deon Pilling RN, BSN Entered By: Erenest Blank on 09/12/2022 13:54:43 -------------------------------------------------------------------------------- Vitals Details Patient Name: Date of Service: [name], [name] 09/12/2022 1:30 PM Medical Record Number: [id number] Patient Account Number: [id number] Date of Birth/Sex: Treating RN: [birth year] (37 y.o. [name] Primary Care Wanisha Shiroma: Vena Rua Other Clinician: Referring Pavielle Biggar: Treating Azlyn Wingler/Extender: Salli Quarry Weeks in Treatment: 5 Vital Signs Time Taken: 13:44 Temperature (F): 98.7 Height (in): 69 Pulse (bpm): 96 Weight (lbs): 235 Respiratory Rate (breaths/min): 18 Body Mass Index (BMI): 34.7 Blood Pressure (mmHg): 147/94 Capillary Blood Glucose (mg/dl): 126 Reference Range: 80 - 120 mg / dl Electronic Signature(s) Signed: 09/13/2022 4:38:15 PM By: Erenest Blank Entered By: Erenest Blank on 09/12/2022 13:44:58

## 2022-09-14 NOTE — Progress Notes (Signed)
LEGRANDE, MACDONELL ([id number]) [id number] Page 1 of 7 Visit Report for 09/12/2022 Chief Complaint Document Details Patient Name: Date of Service: [facility] [name], [name] 09/12/2022 1:30 PM Medical Record Number: [id number] Patient Account Number: [id number] Date of Birth/Sex: Treating RN: May 06, 1986 (37 y.o. [name] Primary Care Provider: Vena Rua Other Clinician: Referring Provider: Treating Provider/Extender: Salli Quarry Weeks in Treatment: 5 Information Obtained from: Patient Chief Complaint 1/52/23; patient is here predominantly for a surgical wound in the left inframammary area extending into the left lateral chest. Patient also has a small area on the right inframammary area probably related to hidradenitis Electronic Signature(s) Signed: 09/12/2022 3:39:58 PM By: Kalman Shan DO Entered By: Kalman Shan on 09/12/2022 14:09:53 -------------------------------------------------------------------------------- HPI Details Patient Name: Date of Service: [name], [name] 09/12/2022 1:30 PM Medical Record Number: [id number] Patient Account Number: [id number] Date of Birth/Sex: Treating RN: 1986-03-16 (37 y.o. [name] Primary Care Provider: Vena Rua Other Clinician: Referring Provider: Treating Provider/Extender: Salli Quarry Weeks in Treatment: 5 History of Present Illness HPI Description: ADMISSION 08/08/2021 This is a 37 year old man who underwent an extensive hospitalization from 06/23/2022 through 07/13/2022 with a large necrotic area on the left inframammary area extending into the left lateral chest. The patient states this came on fairly rapidly initially with a boil. This was aspirated on 12/5 and he underwent an extensive operative debridement ultimately on 12/8. Cultures showed rare Prevotella and moderate actinomyces. He initially was treated with daptomycin and Unasyn  in the hospital then Augmentin and more recently he is continued on amoxicillin. He has a follow-up with Dr. Drucilla Schmidt in a few days. During the course of the hospitalization he was discovered to be a new onset diabetic with a hemoglobin A1c of 11.6. Since his discharge from the hospital he has been followed using a wound VAC on the surgical area. There is an impressive postoperative picture in epic sewing a large open surgical wound. There is been considerable improvement with the wound VAC being changed by home health. I cannot see the notes from either infectious disease or surgery Past medical history includes obesity, new onset diabetes as mentioned. He was a smoker. 1/22; patient presents for follow-up. He has been using silver alginate 3 times weekly. He has some skin irritation to the left sided periwound. He had follow-up with infectious disease, Dr. Drucilla Schmidt on 1/17. He is currently taking amoxicillin and plan is to continue this for the next 5 months. 1/29; patient presents for follow-up. He did not obtain Vashe solution. He has been using normal saline wet-to-dry dressings. He has no issues or complaints today. We gave him sample of Vashe in office. 2/5; patient presents for follow-up. [name] to the wound beds. He has had significant improvement in wound healing to the left surgical wound site. He has not heard from dermatology for referral sent. 2/19; patient presents for follow-up. [name] to the left surgical wound site. He is scheduled to see dermatology on March 5 for an area under his right breast. Electronic Signature(s) Signed: 09/12/2022 3:39:58 PM By: Estell Harpin ([id number].pdf Page 2 of 7 Entered By: Kalman Shan on 09/12/2022 14:10:55 -------------------------------------------------------------------------------- Physical Exam Details Patient  Name: Date of Service: [name], [name] 09/12/2022 1:30 PM Medical Record Number: [id number] Patient Account Number: [id number] Date of Birth/Sex: Treating RN: Feb 19, 1986 (37 y.o.  [name] Primary Care Provider: Vena Rua Other Clinician: Referring Provider: Treating Provider/Extender: Salli Quarry Weeks in Treatment: 5 Constitutional respirations regular, non-labored and within target range for patient.. Cardiovascular 2+ dorsalis pedis/posterior tibialis pulses. Psychiatric pleasant and cooperative. Notes Narrowed wound to the left inframamillary area extending into the left lateral chest wall. This has granulated tissue throughout with Areas of epithelization. No tunneling or undermining. T the right side under the breast there is a small open area consistent with hidradenitis that tunnels at the 1 o'clock position. No o surrounding signs of infection. Electronic Signature(s) Signed: 09/12/2022 3:39:58 PM By: Kalman Shan DO Entered By: Kalman Shan on 09/12/2022 14:12:19 -------------------------------------------------------------------------------- Physician Orders Details Patient Name: Date of Service: [name], [name] 09/12/2022 1:30 PM Medical Record Number: [id number] Patient Account Number: [id number] Date of Birth/Sex: Treating RN: 11-29-1985 (37 y.o. [name] Primary Care Provider: Vena Rua Other Clinician: Referring Provider: Treating Provider/Extender: Salli Quarry Weeks in Treatment: 5 Verbal / Phone Orders: No Diagnosis Coding Follow-up Appointments Return appointment in 3 weeks. - Monday 10/03/2022 Dr. Heber [place] 130pm Other: - [facility] schedule 09/27/2022. Anesthetic (In clinic) Topical Lidocaine 4% applied to wound bed Bathing/ Shower/ Hygiene May shower and wash wound with soap and water. Negative Presssure Wound Therapy Discontinue wound vac. Call the number on the  machine for the company to pick up. Home Health No change in wound care orders this week; continue Home Health for wound care. May utilize formulary equivalent dressing for wound treatment orders unless otherwise specified. - x2 week wound care changes. wound center weekly. change to vashe wet to dry dressings to all wounds. Other Home Health Orders/Instructions: - [place] [facility] TREGAN, WHITFIELD ([id number]) [id number] Page 3 of 7 Wound Treatment Wound #1 - Chest Wound Laterality: Left Cleanser: Soap and Water 3 x Per Week/30 Days Discharge Instructions: May shower and wash wound with dial antibacterial soap and water prior to dressing change. Cleanser: Wound Cleanser (Home Health) 3 x Per Week/30 Days Discharge Instructions: Cleanse the wound with wound cleanser prior to applying a clean dressing using gauze sponges, not tissue or cotton balls. Peri-Wound Care: Skin Prep (Home Health) 3 x Per Week/30 Days Discharge Instructions: Use skin prep as directed Peri-Wound Care: Zinc Oxide Ointment 30g tube (Home Health) 3 x Per Week/30 Days Discharge Instructions: Apply Zinc Oxide to periwound with each dressing change due to skin irritation Prim Dressing: vashe wet to dry (Home Health) 3 x Per Week/30 Days ary Discharge Instructions: apply soaked vashe gauze directly to wound bed. Secondary Dressing: ABD Pad, 8x10 (Home Health) 3 x Per Week/30 Days Discharge Instructions: Apply over primary dressing as directed. Electronic Signature(s) Signed: 09/12/2022 3:39:58 PM By: Kalman Shan DO Entered By: Kalman Shan on 09/12/2022 14:12:42 -------------------------------------------------------------------------------- Problem List Details Patient Name: Date of Service: [name], [name] 09/12/2022 1:30 PM Medical Record Number: [id number] Patient Account Number: [id number] Date of Birth/Sex: Treating RN: [birth year] (37 y.o. [name] Primary Care  Provider: Vena Rua Other Clinician: Referring Provider: Treating Provider/Extender: Salli Quarry Weeks in Treatment: 5 Active Problems ICD-10 Encounter Code Description Active Date MDM Diagnosis T81.31XD Disruption of external operation (surgical) wound, not elsewhere classified, 08/08/2022 No Yes subsequent encounter L02.213 Cutaneous abscess of chest wall 08/08/2022 No Yes L98.498 Non-pressure chronic ulcer of skin of other sites with other specified severity 08/08/2022 No Yes L73.2 Hidradenitis suppurativa 08/08/2022 No Yes E11.622 Type 2 diabetes mellitus with  other skin ulcer 08/08/2022 No Yes Inactive Problems Resolved Problems ALWIN, HAILU ([id number]) [phone].pdf Page 4 of 7 Electronic Signature(s) Signed: 09/12/2022 3:39:58 PM By: Kalman Shan DO Entered By: Kalman Shan on 09/12/2022 14:08:57 -------------------------------------------------------------------------------- Progress Note Details Patient Name: Date of Service: [name], [name] 09/12/2022 1:30 PM Medical Record Number: [id number] Patient Account Number: [id number] Date of Birth/Sex: Treating RN: 1985-09-23 (37 y.o. [name] Primary Care Provider: Vena Rua Other Clinician: Referring Provider: Treating Provider/Extender: Jhonnie Garner in Treatment: 5 Subjective Chief Complaint Information obtained from Patient 1/52/23; patient is here predominantly for a surgical wound in the left inframammary area extending into the left lateral chest. Patient also has a small area on the right inframammary area probably related to hidradenitis History of Present Illness (HPI) ADMISSION 08/08/2021 This is a 37 year old man who underwent an extensive hospitalization from 06/23/2022 through 07/13/2022 with a large necrotic area on the left inframammary area extending into the left lateral chest. The patient states this  came on fairly rapidly initially with a boil. This was aspirated on 12/5 and he underwent an extensive operative debridement ultimately on 12/8. Cultures showed rare Prevotella and moderate actinomyces. He initially was treated with daptomycin and Unasyn in the hospital then Augmentin and more recently he is continued on amoxicillin. He has a follow-up with Dr. Drucilla Schmidt in a few days. During the course of the hospitalization he was discovered to be a new onset diabetic with a hemoglobin A1c of 11.6. Since his discharge from the hospital he has been followed using a wound VAC on the surgical area. There is an impressive postoperative picture in epic sewing a large open surgical wound. There is been considerable improvement with the wound VAC being changed by home health. I cannot see the notes from either infectious disease or surgery Past medical history includes obesity, new onset diabetes as mentioned. He was a smoker. 1/22; patient presents for follow-up. He has been using silver alginate 3 times weekly. He has some skin irritation to the left sided periwound. He had follow-up with infectious disease, Dr. Drucilla Schmidt on 1/17. He is currently taking amoxicillin and plan is to continue this for the next 5 months. 1/29; patient presents for follow-up. He did not obtain Vashe solution. He has been using normal saline wet-to-dry dressings. He has no issues or complaints today. We gave him sample of Vashe in office. 2/5; patient presents for follow-up. [name] to the wound beds. He has had significant improvement in wound healing to the left surgical wound site. He has not heard from dermatology for referral sent. 2/19; patient presents for follow-up. [name] to the left surgical wound site. He is scheduled to see dermatology on March 5 for an area under his right breast. Patient History Family History Cancer - Maternal  Grandparents,Paternal Grandparents, Diabetes - Mother. Social History Current every day smoker - 1 ppd, Alcohol Use - Moderate. Medical History Respiratory Patient has history of Asthma Endocrine Patient has history of Type II Diabetes Hospitalization/Surgery History - 12/8 and12/10 IandD left breast abscess. Medical A Surgical History Notes nd Constitutional Symptoms [facility] breast abscess Psychiatric anxiety LENNYX, GAAL ([id number]) [phone].pdf Page 5 of 7 Objective Constitutional respirations regular, non-labored and within target range for patient.. Vitals Time Taken: 1:44 PM, Height: 69 in, Weight: 235 lbs, BMI: 34.7, Temperature: 98.7 F, Pulse: 96 bpm, Respiratory Rate: 18 breaths/min, Blood Pressure: 147/94 mmHg,  Capillary Blood Glucose: 126 mg/dl. Cardiovascular 2+ dorsalis pedis/posterior tibialis pulses. Psychiatric pleasant and cooperative. General Notes: Narrowed wound to the left inframamillary area extending into the left lateral chest wall. This has granulated tissue throughout with Areas of epithelization. No tunneling or undermining. T the right side under the breast there is a small open area consistent with hidradenitis that tunnels at the 1 o'clock o position. No surrounding signs of infection. Integumentary (Hair, Skin) Wound #1 status is Open. Original cause of wound was Surgical Injury. The date acquired was: 07/03/2022. The wound has been in treatment 5 weeks. The wound is located on the Left Chest. The wound measures 0.5cm length x 1.5cm width x 0.1cm depth; 0.589cm^2 area and 0.059cm^3 volume. There is Fat Layer (Subcutaneous Tissue) exposed. There is a medium amount of purulent drainage noted. The wound margin is distinct with the outline attached to the wound base. There is large (67-100%) red, pink, hyper - granulation within the wound bed. There is no necrotic tissue within the wound bed. The periwound  skin appearance did not exhibit: Callus, Crepitus, Excoriation, Induration, Rash, Scarring, Dry/Scaly, Maceration, Atrophie Blanche, Cyanosis, Ecchymosis, Hemosiderin Staining, Mottled, Pallor, Rubor, Erythema. Assessment Active Problems ICD-10 Disruption of external operation (surgical) wound, not elsewhere classified, subsequent encounter Cutaneous abscess of chest wall Non-pressure chronic ulcer of skin of other sites with other specified severity Hidradenitis suppurativa Type 2 diabetes mellitus with other skin ulcer Patient's left sided surgical wound site appears well-healing. I recommended continuing Vashe wet-to-dry dressings here. He is scheduled to see dermatology on March 5 to the area concerning for hidradenitis under the right breast. For now he can continue Vashe wet-to-dry dressings here. Follow-up in 3 weeks. Plan Follow-up Appointments: Return appointment in 3 weeks. - Monday 10/03/2022 Dr. Heber [place] 130pm Other: - [facility] schedule 09/27/2022. Anesthetic: (In clinic) Topical Lidocaine 4% applied to wound bed Bathing/ Shower/ Hygiene: May shower and wash wound with soap and water. Negative Presssure Wound Therapy: Discontinue wound vac. Call the number on the machine for the company to pick up. Home Health: No change in wound care orders this week; continue Home Health for wound care. May utilize formulary equivalent dressing for wound treatment orders unless otherwise specified. - x2 week wound care changes. wound center weekly. change to vashe wet to dry dressings to all wounds. Other Home Health Orders/Instructions: - [facility] WOUND #1: - Chest Wound Laterality: Left Cleanser: Soap and Water 3 x Per Week/30 Days Discharge Instructions: May shower and wash wound with dial antibacterial soap and water prior to dressing change. Cleanser: Wound Cleanser (Home Health) 3 x Per Week/30 Days Discharge Instructions: Cleanse the wound with wound cleanser prior  to applying a clean dressing using gauze sponges, not tissue or cotton balls. Peri-Wound Care: Skin Prep (Home Health) 3 x Per Week/30 Days Discharge Instructions: Use skin prep as directed Peri-Wound Care: Zinc Oxide Ointment 30g tube (Home Health) 3 x Per Week/30 Days Discharge Instructions: Apply Zinc Oxide to periwound with each dressing change due to skin irritation Prim Dressing: vashe wet to dry (Home Health) 3 x Per Week/30 Days ary Discharge Instructions: apply soaked vashe gauze directly to wound bed. Secondary Dressing: ABD Pad, 8x10 (Home Health) 3 x Per Week/30 Days Discharge Instructions: Apply over primary dressing as directed. 1. Vashe wet-to-dry dressings 2. Follow-up in 3 weeks WALFRED, FARSON ([id number]) [phone].pdf Page 6 of 7 Electronic Signature(s) Signed: 09/12/2022 3:39:58 PM By: Kalman Shan DO Entered By: Kalman Shan on 09/12/2022 14:13:51 --------------------------------------------------------------------------------  HxROS Details Patient Name: Date of Service: [name], [name] 09/12/2022 1:30 PM Medical Record Number: [id number] Patient Account Number: [id number] Date of Birth/Sex: Treating RN: 1986/02/10 (37 y.o. [name] Primary Care Provider: Vena Rua Other Clinician: Referring Provider: Treating Provider/Extender: Salli Quarry Weeks in Treatment: 5 Constitutional Symptoms (General Health) Medical History: Past Medical History Notes: breast abscess Respiratory Medical History: Positive for: Asthma Endocrine Medical History: Positive for: Type II Diabetes Treated with: Insulin Psychiatric Medical History: Past Medical History Notes: anxiety Immunizations Implantable Devices No devices added Hospitalization / Surgery History Type of Hospitalization/Surgery 12/8 and12/10 IandD left breast abscess Family and Social History Cancer: Yes - Maternal Grandparents,Paternal  Grandparents; Diabetes: Yes - Mother; Current every day smoker - 1 ppd; Alcohol Use: Moderate Electronic Signature(s) Signed: 09/12/2022 3:39:58 PM By: Kalman Shan DO Signed: 09/13/2022 4:50:49 PM By: Deon Pilling RN, BSN Entered By: Kalman Shan on 09/12/2022 14:11:34 -------------------------------------------------------------------------------- SuperBill Details Patient Name: Date of Service: [name], [name] 09/12/2022 Medical Record Number: [id number] Patient Account Number: [id number] [name] ([id number]) [phone].pdf Page 7 of 7 Date of Birth/Sex: Treating RN: Dec 23, 1985 (37 y.o. [name] Primary Care Provider: Vena Rua Other Clinician: Referring Provider: Treating Provider/Extender: Salli Quarry Weeks in Treatment: 5 Diagnosis Coding ICD-10 Codes Code Description T81.31XD Disruption of external operation (surgical) wound, not elsewhere classified, subsequent encounter L02.213 Cutaneous abscess of chest wall L98.498 Non-pressure chronic ulcer of skin of other sites with other specified severity L73.2 Hidradenitis suppurativa E11.622 Type 2 diabetes mellitus with other skin ulcer Facility Procedures : CPT4 Code: [id number] Description: 99213 - WOUND CARE VISIT-LEV 3 EST PT Modifier: Quantity: 1 Physician Procedures : CPT4 Code Description Modifier [id number] - WC PHYS LEVEL 3 - EST PT ICD-10 Diagnosis Description L98.498 Non-pressure chronic ulcer of skin of other sites with other specified severity T81.31XD Disruption of external operation (surgical) wound, not  elsewhere classified, subsequent encounter L02.213 Cutaneous abscess of chest wall E11.622 Type 2 diabetes mellitus with other skin ulcer Quantity: 1 Electronic Signature(s) Signed: 09/12/2022 3:39:58 PM By: Kalman Shan DO Entered By: Kalman Shan on 09/12/2022 14:14:09

## 2022-09-20 NOTE — Progress Notes (Signed)
Lab results mail it to pt home address.

## 2022-09-23 ENCOUNTER — Other Ambulatory Visit: Payer: Self-pay

## 2022-09-23 ENCOUNTER — Ambulatory Visit (INDEPENDENT_AMBULATORY_CARE_PROVIDER_SITE_OTHER): Payer: No Typology Code available for payment source

## 2022-09-23 VITALS — BP 128/85 | HR 96 | Ht 69.0 in | Wt 238.0 lb

## 2022-09-23 DIAGNOSIS — I10 Essential (primary) hypertension: Secondary | ICD-10-CM | POA: Diagnosis not present

## 2022-09-23 MED ORDER — INSULIN PEN NEEDLE 32G X 4 MM MISC: 0 refills | Status: DC

## 2022-09-23 MED ORDER — GLUCOSE BLOOD VI STRP: ORAL_STRIP | 0 refills | Status: DC

## 2022-09-23 NOTE — Progress Notes (Signed)
Blood Pressure Recheck Visit  Name: [name] MRN: [id number] Date of Birth: Apr 14, 1986  [name] presents today for Blood Pressure recheck with clinical support staff.  Order for BP recheck by [facility], ordered on 09/23/22 .   BP Readings from Last 3 Encounters:  08/26/22 (!) 145/87  08/10/22 (!) 152/108  07/22/22 110/68    Current Outpatient Medications  Medication Sig Dispense Refill   Accu-Chek FastClix Lancets MISC Use as directed 4 times a day before meals and at bedtime 102 each 0   amoxicillin (AMOXIL) 500 MG capsule Take 1 capsule (500 mg total) by mouth 3 (three) times daily for 40 days. START TAKING on 08/11/2022 after you finish Augmentin (amoxicillin/clavulanic acid) 90 capsule 4   atorvastatin (LIPITOR) 10 MG tablet Take 1 tablet (10 mg total) by mouth daily. 90 tablet 0   blood glucose meter kit and supplies KIT Use as directed 1 each 0   gabapentin (NEURONTIN) 100 MG capsule Take 1 capsule (100 mg total) by mouth 3 (three) times daily. 90 capsule 3   glucose blood test strip Use as directed 4 times a day before meals and at bedtime 100 each 0   Insulin Lispro Prot & Lispro (HUMALOG MIX 75/25 KWIKPEN) (75-25) 100 UNIT/ML Kwikpen Inject 15 Units into the skin 2 (two) times daily. 15 mL 11   Insulin Pen Needle 32G X 4 MM MISC Use as directed for 4 times a day for insulin pens 100 each 0   lisinopril (ZESTRIL) 5 MG tablet Take 1 tablet (5 mg total) by mouth daily. 90 tablet 3   acetaminophen (TYLENOL) 325 MG tablet Take 2 tablets (650 mg total) by mouth every 8 (eight) hours as needed for moderate pain. (Patient not taking: Reported on 08/10/2022) 20 tablet 0   fluconazole (DIFLUCAN) 100 MG tablet Take 1 tablet (100 mg total) by mouth daily. (Patient not taking: Reported on 09/23/2022) 14 tablet 4   ibuprofen (ADVIL) 200 MG tablet Take 200 mg by mouth every 6 (six) hours as needed for mild pain. (Patient not taking: Reported on 09/23/2022)     insulin aspart (NOVOLOG) 100  UNIT/ML FlexPen Before each meal 3 times a day, CBG 140-199 - 2 units, 200-250 - 4 units, 251-299 - 6 units,  300-349 - 8 units,  350 or above-10 units. (Patient not taking: Reported on 09/23/2022) 15 mL 0   nystatin (MYCOSTATIN/NYSTOP) powder Apply 1 Application topically 3 (three) times daily. Can use as preventative powder for yeast infection (Patient not taking: Reported on 08/26/2022) 30 g 5   oxyCODONE 10 MG TABS Take 0.5-1 tablets (5-10 mg total) by mouth every 6 (six) hours as needed for severe pain or moderate pain ('5mg'$  for moderate pain, '10mg'$  for severe pain). (Patient not taking: Reported on 09/23/2022) 25 tablet 0   Probiotic Product (RISA-BID PROBIOTIC) TABS Take 1 tablet by mouth 2 (two) times daily. (Patient not taking: Reported on 08/26/2022) 60 tablet 2   No current facility-administered medications for this visit.    Hypertensive Medication Review: Patient states that they are taking all their hypertensive medications as prescribed and their last dose of hypertensive medications was this morning   Documentation of any medication adherence discrepancies: none  Provider Recommendation:  Spoke to [facility]  and they stated: That pt can continue with current medication and follow as planned.  Patient has been scheduled to follow up with 10/26/2022    Patient has been given provider's recommendations and does not have any  questions or concerns at this time. Patient will contact the office for any future questions or concerns.

## 2022-09-27 ENCOUNTER — Encounter: Payer: Self-pay | Admitting: Dermatology

## 2022-09-27 ENCOUNTER — Ambulatory Visit (INDEPENDENT_AMBULATORY_CARE_PROVIDER_SITE_OTHER): Payer: No Typology Code available for payment source | Admitting: Dermatology

## 2022-09-27 VITALS — BP 147/97

## 2022-09-27 DIAGNOSIS — L732 Hidradenitis suppurativa: Secondary | ICD-10-CM | POA: Diagnosis not present

## 2022-09-27 MED ORDER — TRIAMCINOLONE ACETONIDE 10 MG/ML IJ SUSP: 10.0000 mg | Freq: Once | INTRAMUSCULAR | Status: DC

## 2022-09-27 MED ORDER — CLINDAMYCIN PHOSPHATE 1 % EX SOLN: Freq: Every day | CUTANEOUS | 0 refills | Status: DC

## 2022-09-27 NOTE — Patient Instructions (Signed)
Hidradenitis Suppurativa: Patient Handout  What is Hidradenitis Suppurativa (HS)?  Hidradenitis Suppurativa (HS) is a chronic inflammatory skin condition characterized by painful, recurrent nodules, abscesses, and sinus tracts in areas rich in apocrine glands, such as the armpits, groin, and buttocks. HS can significantly impact quality of life due to pain, scarring, and drainage of pus.  Causes:  The exact cause of HS is not fully understood, but it is believed to involve a combination of genetic, hormonal, and environmental factors. Risk factors for HS include:  Genetics: Family history of HS may increase the risk of developing the condition.  Hormonal Factors: Hormonal changes, such as puberty and menstruation, may influence the development and exacerbation of HS.  Obesity: Being overweight or obese is associated with an increased risk of HS.  Mechanism of Action:  HS is thought to involve inflammation of hair follicles and apocrine glands, leading to the formation of nodules, abscesses, and sinus tracts. Factors such as friction, sweating, and bacterial colonization may exacerbate inflammation and trigger flare-ups.  Common Treatments:  Topical Treatments:  Topical Antibiotics: Antibacterial creams or washes may be prescribed to reduce bacterial colonization and prevent infection. Topical Retinoids: Retinoid creams may help prevent new lesions and reduce inflammation. Oral Treatments:  Oral Antibiotics: Antibiotics such as tetracycline or clindamycin may be prescribed to control bacterial overgrowth and reduce inflammation. Anti-inflammatory Medications: Nonsteroidal anti-inflammatory drugs (NSAIDs) or corticosteroids may be used to reduce pain and inflammation during flare-ups. Injectable Treatments:  Intralesional Steroid Injections: Steroid injections directly into inflamed lesions may help reduce inflammation and promote healing. Biologic Therapies: Biologic medications, such  as adalimumab or infliximab, may be prescribed for severe, refractory cases of HS to target specific inflammatory pathways. Surgical Treatments:  Incision and Drainage: Large abscesses may require incision and drainage to relieve pain and promote healing. Laser Therapy: Laser treatments, such as CO2 laser or Nd:YAG laser, may help reduce inflammation and scarring in HS lesions. Excision Surgery: Surgical removal of affected skin and tissue may be considered for severe or recurrent HS that does not respond to other treatments. Conclusion:  Hidradenitis Suppurativa is a chronic inflammatory skin condition that can have a significant impact on quality of life. Treatment aims to control symptoms, reduce inflammation, and prevent flare-ups. A combination of topical, oral, injectable, and surgical treatments may be used depending on the severity and individual response to therapy. It is important to work closely with a dermatologist to develop a personalized treatment plan and to monitor for any potential side effects or complications. Early intervention and comprehensive management are key to managing HS effectively and improving overall well-being.

## 2022-09-27 NOTE — Progress Notes (Signed)
   New Patient Visit  Subjective  [name] is a 37 y.o. male who presents for the following: Hidardenitis Suppurativia (Under left arm. Typically selfcare at home. Boil burst in November, chest got swollen after that. Urgent care believed the pressing made the infection spread to chest from lymph nodes. 30cm scar from it being cut and drained. [name] flare up right now under left armpit. Good day, no current irritation, red but no swelling/Never seen a dermatologist. [name] at home consists of hot compress and boil ease. Seemed to help some. Has not attempted to do at home care since incident in November).    Objective  Well appearing patient in no apparent distress; mood and affect are within normal limits.  A focused examination was performed including right and left axilla. Relevant physical exam findings are noted in the Assessment and Plan.  Left Axilla Hurley Stage 3: multiple interconnected sinus tracts and abscesses throughout and entire area    Assessment & Plan  Hidradenitis suppurativa Left Axilla  Plan: Counseling I counseled the patient regarding the following: Skin care: Cleanse acneiform lesions and sinus tracts with anti-bacterial washes. Oral antibiotics can help reduce inflammation. Expectations: Hidradenitis Suppurativa is characterized by acneiform lesions and draining sinus tracts in the axillary, abdominal or inguinal folds. Contact office if: Patient develops worsening lesions or fistula formation despite treatment.  I recommended the following Treatments: Benzoyl Peroxide Wash Topical Antibiotics: Clindamycin Lotion Oral Antibiotics PRN Flares -->  Will add in Doxycyline for flares once Amoxicillin is completed.  Intra-lesional Triamcinolone '10mg'$  injection administered in office- 4 cyst injected on left axilla, 3 cyst injected on right axilla, total of 1.31m     Return in about 3 months (around 12/28/2022) for HS follow up .  I, NZigmund Gottron CMA, am  acting as scribe for JEllard Artis MD.

## 2022-09-28 ENCOUNTER — Telehealth: Payer: Self-pay | Admitting: Nurse Practitioner

## 2022-09-28 ENCOUNTER — Ambulatory Visit: Payer: Self-pay | Admitting: Nurse Practitioner

## 2022-09-28 NOTE — Telephone Encounter (Signed)
Pt came into office to see if his Disability form that was faxed   Please call the pt

## 2022-09-28 NOTE — Telephone Encounter (Signed)
Pt was scheduled to be seen tomorrow for the forms he has. [place]

## 2022-09-30 ENCOUNTER — Encounter: Payer: Self-pay | Admitting: Nurse Practitioner

## 2022-09-30 ENCOUNTER — Ambulatory Visit (INDEPENDENT_AMBULATORY_CARE_PROVIDER_SITE_OTHER): Payer: No Typology Code available for payment source | Admitting: Nurse Practitioner

## 2022-09-30 VITALS — BP 153/83 | HR 88 | Temp 98.6°F | Ht 69.0 in | Wt 238.8 lb

## 2022-09-30 DIAGNOSIS — L03313 Cellulitis of chest wall: Secondary | ICD-10-CM

## 2022-09-30 DIAGNOSIS — E1165 Type 2 diabetes mellitus with hyperglycemia: Secondary | ICD-10-CM | POA: Diagnosis not present

## 2022-09-30 DIAGNOSIS — L732 Hidradenitis suppurativa: Secondary | ICD-10-CM

## 2022-09-30 DIAGNOSIS — E782 Mixed hyperlipidemia: Secondary | ICD-10-CM

## 2022-09-30 DIAGNOSIS — Z794 Long term (current) use of insulin: Secondary | ICD-10-CM

## 2022-09-30 DIAGNOSIS — I10 Essential (primary) hypertension: Secondary | ICD-10-CM | POA: Diagnosis not present

## 2022-09-30 DIAGNOSIS — Z720 Tobacco use: Secondary | ICD-10-CM

## 2022-09-30 DIAGNOSIS — E785 Hyperlipidemia, unspecified: Secondary | ICD-10-CM | POA: Insufficient documentation

## 2022-09-30 LAB — POCT GLYCOSYLATED HEMOGLOBIN (HGB A1C): Hemoglobin A1C: 6.9 % — AB (ref 4.0–5.6)

## 2022-09-30 MED ORDER — BLOOD PRESSURE KIT: PACK | 0 refills | Status: AC

## 2022-09-30 MED ORDER — AMLODIPINE BESYLATE 5 MG PO TABS: 5.0000 mg | ORAL_TABLET | Freq: Every day | ORAL | 0 refills | Status: DC

## 2022-09-30 NOTE — Assessment & Plan Note (Addendum)
Has foam dressing in place, wound healing well per patient Continue wound care at home and at the wound care office, twice weekly He plans to return to work in April Disability paperwork completed today Take ibuprofen or Tylenol as needed for pain Amoxicillin 500 mg 3 times daily Maintain close follow-up with ID

## 2022-09-30 NOTE — Assessment & Plan Note (Signed)
Left underarm firm tender on palpation Skin warm and dry no drainage noted. Patient was encouraged to maintain close follow-up with dermatology Continue clindamycin 1% external solution daily

## 2022-09-30 NOTE — Patient Instructions (Addendum)
  Primary hypertension  - amLODipine (NORVASC) 5 MG tablet; Take 1 tablet (5 mg total) by mouth daily.  Dispense: 90 tablet; Refill: 0   Blood pressure goal is less than 130/80  Around 3 times per week, check your blood pressure 2 times per day. once in the morning and once in the evening. The readings should be at least one minute apart. Write down these values and bring them to your next nurse visit/appointment.  When you check your BP, make sure you have been doing something calm/relaxing 5 minutes prior to checking. Both feet should be flat on the floor and you should be sitting. Use your left arm and make sure it is in a relaxed position (on a table), and that the cuff is at the approximate level/height of your heart.    It is important that you exercise regularly at least 30 minutes 5 times a week as tolerated  Think about what you will eat, plan ahead. Choose " clean, green, fresh or frozen" over canned, processed or packaged foods which are more sugary, salty and fatty. 70 to 75% of food eaten should be vegetables and fruit. Three meals at set times with snacks allowed between meals, but they must be fruit or vegetables. Aim to eat over a 12 hour period , example 7 am to 7 pm, and STOP after  your last meal of the day. Drink water,generally about 64 ounces per day, no other drink is as healthy. Fruit juice is best enjoyed in a healthy way, by EATING the fruit.  Thanks for choosing [facility] we consider it a privelige to serve you.

## 2022-09-30 NOTE — Assessment & Plan Note (Signed)
BP Readings from Last 3 Encounters:  09/30/22 (!) 153/83  09/27/22 (!) 147/97  09/23/22 128/85  Currently on lisinopril 5 mg daily Blood pressure remains elevated goal is less than 130/80 Start amlodipine 5 mg daily monitor blood pressure daily at home and report hypertension Follow-up in 4 weeks after DASH diet advised engage in regular moderate exercises at least 150 minutes weekly

## 2022-09-30 NOTE — Assessment & Plan Note (Signed)
Lab Results  Component Value Date   HGBA1C 6.9 (A) 09/30/2022  Patient congratulated on his efforts t getting his diabetes under control.  Continue Humalog mix 75/25, 15 units twice daily, humalog sliding scale as ordered CBG goals discussed Patient counseled on low-carb modified diet

## 2022-09-30 NOTE — Assessment & Plan Note (Signed)
Smokes about 3 cigarettes daily Smoking cessation encouraged Has been using over-the-counter nicotine patch

## 2022-09-30 NOTE — Progress Notes (Signed)
Established Patient Office Visit  Subjective:  Patient ID: [name], male    DOB: 12-07-1985  Age: 37 y.o. MRN: [id number]  CC:  Chief Complaint  Patient presents with   Follow-up    Paper work.     HPI [name] is a 37 y.o. 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  Patient presents for follow-up for his chronic medical conditions.   Hypertension.  Currently on lisinopril 5 mg daily he has not been checking his blood pressure at home he denies chest pain, dizziness, edema  Type 2 diabetes.  Currently on Humalog mix 75/25 15 units twice daily.  Blood sugar readings at home has been between 100s to 130s.  He has not needed NovoLog until he got a steroid injection at the dermatologist office.   HS/chest wall abscess.  Has a new boil  under his left arm, he has been having wound dressing changes at home once weekly, will continue for the next 3 weeks, goes to the wound care office once weekly for wound care.  He hopes to go back to work in April.  Takes gabapentin for pain.  He currently denies fever, chills has some tenderness on palpation of his left underarm.     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   Asthma    Breast abscess 06/28/2022   Chest wall abscess 08/10/2022   Intertrigo 08/10/2022    Past Surgical History:  Procedure Laterality Date   INCISION AND DRAINAGE ABSCESS Left 07/01/2022   Procedure: INCISION AND DRAINAGE ABSCESS;  Surgeon: Jovita Kussmaul, MD;  Location: [facility];  Service: General;  Laterality: Left;   IRRIGATION AND DEBRIDEMENT ABSCESS Left 07/03/2022   Procedure: IRRIGATION AND DEBRIDEMENT CHEST WALL / Breast ABSCESS;  Surgeon: Georganna Skeans, MD;  Location: [facility];  Service: General;  Laterality: Left;    Family History  Problem Relation Age of Onset   Diabetes Mother    Breast  cancer Maternal Grandmother    Diabetes Paternal Grandmother    Diabetes Paternal Grandfather    Colon cancer Neg Hx     Social History   Socioeconomic History   Marital status: Single    Spouse name: Not on file   Number of children: 1   Years of education: Not on file   Highest education level: Not on file  Occupational History   Not on file  Tobacco Use   Smoking status: Every Day    Packs/day: 0.30    Types: Cigarettes   Smokeless tobacco: Never  Substance and Sexual Activity   Alcohol use: Yes    Alcohol/week: 13.0 standard drinks of alcohol    Types: 9 Cans of beer, 4 Shots of liquor per week    Comment: occasional   Drug use: Not Currently    Types: Marijuana   Sexual activity: Yes  Other Topics Concern   Not on file  Social History Narrative   Lives with his mother.    Social Determinants of Health   Financial Resource Strain: Not on file  Food Insecurity: No Food Insecurity (06/28/2022)   Hunger Vital Sign    Worried About Running Out of Food in the Last Year: Never true    Ran Out of Food in the Last Year: Never true  Transportation Needs: No Transportation Needs (06/28/2022)   PRAPARE - Transportation    Lack  of Transportation (Medical): No    Lack of Transportation (Non-Medical): No  Physical Activity: Not on file  Stress: Not on file  Social Connections: Not on file  Intimate Partner Violence: Not At Risk (06/28/2022)   Humiliation, Afraid, Rape, and Kick questionnaire    Fear of Current or Ex-Partner: No    Emotionally Abused: No    Physically Abused: No    Sexually Abused: No    Outpatient Medications Prior to Visit  Medication Sig Dispense Refill   Accu-Chek FastClix Lancets MISC Use as directed 4 times a day before meals and at bedtime 102 each 0   amoxicillin (AMOXIL) 500 MG capsule Take 1 capsule (500 mg total) by mouth 3 (three) times daily for 40 days. START TAKING on 08/11/2022 after you finish Augmentin (amoxicillin/clavulanic acid) 90  capsule 4   atorvastatin (LIPITOR) 10 MG tablet Take 1 tablet (10 mg total) by mouth daily. 90 tablet 0   blood glucose meter kit and supplies KIT Use as directed 1 each 0   clindamycin (CLEOCIN T) 1 % external solution Apply topically daily. to skin underarms 30 mL 0   gabapentin (NEURONTIN) 100 MG capsule Take 1 capsule (100 mg total) by mouth 3 (three) times daily. 90 capsule 3   glucose blood test strip Use as directed 4 times a day before meals and at bedtime 100 each 0   insulin aspart (NOVOLOG) 100 UNIT/ML FlexPen Before each meal 3 times a day, CBG 140-199 - 2 units, 200-250 - 4 units, 251-299 - 6 units,  300-349 - 8 units,  350 or above-10 units. (Patient taking differently: Before each meal 3 times a day, CBG 140-199 - 2 units, 200-250 - 4 units, 251-299 - 6 units,  300-349 - 8 units,  350 or above-10 units.) 15 mL 0   Insulin Lispro Prot & Lispro (HUMALOG MIX 75/25 KWIKPEN) (75-25) 100 UNIT/ML Kwikpen Inject 15 Units into the skin 2 (two) times daily. 15 mL 11   Insulin Pen Needle 32G X 4 MM MISC Use as directed for 4 times a day for insulin pens 100 each 0   lisinopril (ZESTRIL) 5 MG tablet Take 1 tablet (5 mg total) by mouth daily. 90 tablet 3   Probiotic Product (RISA-BID PROBIOTIC) TABS Take 1 tablet by mouth 2 (two) times daily. 60 tablet 2   acetaminophen (TYLENOL) 325 MG tablet Take 2 tablets (650 mg total) by mouth every 8 (eight) hours as needed for moderate pain. (Patient not taking: Reported on 08/10/2022) 20 tablet 0   ibuprofen (ADVIL) 200 MG tablet Take 200 mg by mouth every 6 (six) hours as needed for mild pain. (Patient not taking: Reported on 09/23/2022)     nystatin (MYCOSTATIN/NYSTOP) powder Apply 1 Application topically 3 (three) times daily. Can use as preventative powder for yeast infection (Patient not taking: Reported on 08/26/2022) 30 g 5   oxyCODONE 10 MG TABS Take 0.5-1 tablets (5-10 mg total) by mouth every 6 (six) hours as needed for severe pain or moderate pain ('5mg'$   for moderate pain, '10mg'$  for severe pain). (Patient not taking: Reported on 09/30/2022) 25 tablet 0   fluconazole (DIFLUCAN) 100 MG tablet Take 1 tablet (100 mg total) by mouth daily. (Patient not taking: Reported on 09/23/2022) 14 tablet 4   Facility-Administered Medications Prior to Visit  Medication Dose Route Frequency Provider Last Rate Last Admin   triamcinolone acetonide (KENALOG) 10 MG/ML injection 10 mg  10 mg Intradermal Once Talbot Grumbling, MD  Allergies  Allergen Reactions   Bee Pollen    Peach [Prunus Persica]     Pt reports swelling and lip tingling as reaction    ROS Review of Systems  Constitutional: Negative.   Respiratory: Negative.    Cardiovascular: Negative.   Endocrine: Negative.   Musculoskeletal: Negative.  Negative for gait problem, joint swelling, myalgias, neck pain and neck stiffness.  Skin:  Positive for wound.  Neurological: Negative.   Psychiatric/Behavioral: Negative.        Objective:    Physical Exam Constitutional:      General: He is not in acute distress.    Appearance: He is obese. He is not ill-appearing, toxic-appearing or diaphoretic.  Eyes:     General: No scleral icterus.       Right eye: No discharge.        Left eye: No discharge.     Extraocular Movements: Extraocular movements intact.  Cardiovascular:     Rate and Rhythm: Normal rate and regular rhythm.     Pulses: Normal pulses.     Heart sounds: Normal heart sounds. No murmur heard.    No friction rub. No gallop.  Pulmonary:     Effort: No respiratory distress.     Breath sounds: No stridor. No wheezing, rhonchi or rales.  Chest:     Chest wall: No tenderness.  Abdominal:     General: There is no distension.     Palpations: There is no mass.     Tenderness: There is no abdominal tenderness. There is no guarding.     Hernia: No hernia is present.  Musculoskeletal:        General: No swelling, tenderness, deformity or signs of injury.     Right lower leg: No  edema.     Left lower leg: No edema.  Skin:    General: Skin is warm and dry.     Findings: No erythema.     Comments: Left underarm firm tender on palpation Skin warm and dry no drainage noted. Faom dressing under left breast  Neurological:     Mental Status: He is alert and oriented to person, place, and time.     Cranial Nerves: No cranial nerve deficit.     Motor: No weakness.     Coordination: Coordination normal.     Gait: Gait normal.  Psychiatric:        Mood and Affect: Mood normal.        Behavior: Behavior normal.        Thought Content: Thought content normal.        Judgment: Judgment normal.     BP (!) 153/83   Pulse 88   Temp 98.6 F (37 C)   Ht '5\' 9"'$  (1.753 m)   Wt 238 lb 12.8 oz (108.3 kg)   SpO2 100%   BMI 35.26 kg/m  Wt Readings from Last 3 Encounters:  09/30/22 238 lb 12.8 oz (108.3 kg)  08/10/22 236 lb (107 kg)  07/22/22 241 lb 6.4 oz (109.5 kg)    Lab Results  Component Value Date   TSH 3.788 06/24/2022   Lab Results  Component Value Date   WBC 5.7 08/10/2022   HGB 12.5 (L) 08/10/2022   HCT 37.3 (L) 08/10/2022   MCV 80.9 08/10/2022   PLT 442 (H) 08/10/2022   Lab Results  Component Value Date   NA 139 09/09/2022   K 4.2 09/09/2022   CO2 22 09/09/2022   GLUCOSE 138 (H) 09/09/2022  BUN 8 09/09/2022   CREATININE 0.74 (L) 09/09/2022   BILITOT 0.4 08/10/2022   ALKPHOS 130 (H) 07/22/2022   AST 14 08/10/2022   ALT 18 08/10/2022   PROT 7.7 08/10/2022   ALBUMIN 3.6 (L) 07/22/2022   CALCIUM 9.7 09/09/2022   ANIONGAP 7 07/11/2022   EGFR 119 09/09/2022   Lab Results  Component Value Date   CHOL 257 (H) 07/22/2022   Lab Results  Component Value Date   HDL 43 07/22/2022   Lab Results  Component Value Date   LDLCALC 175 (H) 07/22/2022   Lab Results  Component Value Date   TRIG 209 (H) 07/22/2022   Lab Results  Component Value Date   CHOLHDL 6.0 (H) 07/22/2022   Lab Results  Component Value Date   HGBA1C 6.9 (A) 09/30/2022       Assessment & Plan:   Problem List Items Addressed This Visit       Cardiovascular and Mediastinum   Primary hypertension    BP Readings from Last 3 Encounters:  09/30/22 (!) 153/83  09/27/22 (!) 147/97  09/23/22 128/85  Currently on lisinopril 5 mg daily Blood pressure remains elevated goal is less than 130/80 Start amlodipine 5 mg daily monitor blood pressure daily at home and report hypertension Follow-up in 4 weeks after DASH diet advised engage in regular moderate exercises at least 150 minutes weekly      Relevant Medications   amLODipine (NORVASC) 5 MG tablet   Blood Pressure KIT     Endocrine   Type 2 diabetes mellitus with hyperglycemia ([place]) - Primary    Lab Results  Component Value Date   HGBA1C 6.9 (A) 09/30/2022  Patient congratulated on his efforts t getting his diabetes under control.  Continue Humalog mix 75/25, 15 units twice daily, humalog sliding scale as ordered CBG goals discussed Patient counseled on low-carb modified diet       Relevant Orders   POCT glycosylated hemoglobin (Hb A1C) (Completed)     Musculoskeletal and Integument   Hidradenitis suppurativa    Left underarm firm tender on palpation Skin warm and dry no drainage noted. Patient was encouraged to maintain close follow-up with dermatology Continue clindamycin 1% external solution daily        Other   Cellulitis of chest wall    Has foam dressing in place, wound healing well per patient Continue wound care at home and at the wound care office, twice weekly He plans to return to work in April Disability paperwork completed today Take ibuprofen or Tylenol as needed for pain Amoxicillin 500 mg 3 times daily Maintain close follow-up with ID      Tobacco abuse    Smokes about 3 cigarettes daily Smoking cessation encouraged Has been using over-the-counter nicotine patch      Mixed hyperlipidemia    Lab Results  Component Value Date   CHOL 257 (H) 07/22/2022   HDL 43  07/22/2022   LDLCALC 175 (H) 07/22/2022   TRIG 209 (H) 07/22/2022   CHOLHDL 6.0 (H) 07/22/2022  Doing well on atorvastatin 10 mg daily Continue current medication Recheck fasting lipid panel at next visit Avoid fatty fried foods      Relevant Medications   amLODipine (NORVASC) 5 MG tablet    Meds ordered this encounter  Medications   amLODipine (NORVASC) 5 MG tablet    Sig: Take 1 tablet (5 mg total) by mouth daily.    Dispense:  90 tablet    Refill:  0  Blood Pressure KIT    Sig: Please check you blood pressure daily.    Dispense:  1 kit    Refill:  0    Follow-up: No follow-ups on file.    Renee Rival, FNP

## 2022-09-30 NOTE — Assessment & Plan Note (Addendum)
Lab Results  Component Value Date   CHOL 257 (H) 07/22/2022   HDL 43 07/22/2022   LDLCALC 175 (H) 07/22/2022   TRIG 209 (H) 07/22/2022   CHOLHDL 6.0 (H) 07/22/2022  Doing well on atorvastatin 10 mg daily Continue current medication Recheck fasting lipid panel at next visit Avoid fatty fried foods

## 2022-10-03 ENCOUNTER — Ambulatory Visit (HOSPITAL_BASED_OUTPATIENT_CLINIC_OR_DEPARTMENT_OTHER): Payer: No Typology Code available for payment source | Admitting: Internal Medicine

## 2022-10-11 ENCOUNTER — Encounter (HOSPITAL_BASED_OUTPATIENT_CLINIC_OR_DEPARTMENT_OTHER): Payer: No Typology Code available for payment source | Attending: Internal Medicine | Admitting: Internal Medicine

## 2022-10-11 DIAGNOSIS — E11622 Type 2 diabetes mellitus with other skin ulcer: Secondary | ICD-10-CM | POA: Insufficient documentation

## 2022-10-11 DIAGNOSIS — J45909 Unspecified asthma, uncomplicated: Secondary | ICD-10-CM | POA: Diagnosis not present

## 2022-10-11 DIAGNOSIS — L02213 Cutaneous abscess of chest wall: Secondary | ICD-10-CM | POA: Diagnosis not present

## 2022-10-11 DIAGNOSIS — L732 Hidradenitis suppurativa: Secondary | ICD-10-CM | POA: Insufficient documentation

## 2022-10-11 DIAGNOSIS — T8131XD Disruption of external operation (surgical) wound, not elsewhere classified, subsequent encounter: Secondary | ICD-10-CM | POA: Diagnosis not present

## 2022-10-11 DIAGNOSIS — E1151 Type 2 diabetes mellitus with diabetic peripheral angiopathy without gangrene: Secondary | ICD-10-CM | POA: Diagnosis not present

## 2022-10-11 DIAGNOSIS — L98498 Non-pressure chronic ulcer of skin of other sites with other specified severity: Secondary | ICD-10-CM | POA: Insufficient documentation

## 2022-10-11 DIAGNOSIS — E669 Obesity, unspecified: Secondary | ICD-10-CM | POA: Diagnosis not present

## 2022-10-12 NOTE — Progress Notes (Signed)
[name], [name] ([id number]) [id number] Page 1 of 8 Visit Report for 10/11/2022 Chief Complaint Document Details Patient Name: Date of Service: [facility] [name], [name] 10/11/2022 2:15 PM Medical Record Number: [id number] Patient Account Number: [id number] Date of Birth/Sex: Treating RN: [birth year] (37 y.o. M) Primary Care Provider: Vena Rua Other Clinician: Referring Provider: Treating Provider/Extender: Jhonnie Garner in Treatment: 9 Information Obtained from: Patient Chief Complaint 1/52/23; patient is here predominantly for Caleb surgical wound in the left inframammary area extending into the left lateral chest. Patient also has Caleb small area on the right inframammary area probably related to hidradenitis Electronic Signature(s) Signed: 10/11/2022 5:03:25 PM By: Kalman Shan DO Entered By: Kalman Shan on 10/11/2022 16:34:00 -------------------------------------------------------------------------------- Debridement Details Patient Name: Date of Service: [name], [name] 10/11/2022 2:15 PM Medical Record Number: [id number] Patient Account Number: [id number] Date of Birth/Sex: Treating RN: 04/07/1986 (37 y.o. [name] Primary Care Provider: Vena Rua Other Clinician: Referring Provider: Treating Provider/Extender: Salli Quarry Weeks in Treatment: 9 Debridement Performed for Assessment: Wound #1 Left Chest Performed By: Physician Kalman Shan, DO Debridement Type: Debridement Level of Consciousness (Pre-procedure): Awake and Alert Pre-procedure Verification/Time Out No Taken: Start Time: 15:09 Pain Control: Lidocaine 5% topical ointment T Area Debrided (L x W): otal 0.5 (cm) x 1 (cm) = 0.5 (cm) Tissue and other material debrided: Non-Viable, Other: Skin Level: Non-Viable Tissue Debridement Description: Selective/Open Wound Instrument: Curette Bleeding: Minimum Hemostasis  Achieved: Pressure End Time: 15:11 Post Procedural Pain: 0 Response to Treatment: Procedure was tolerated well Level of Consciousness (Post- Awake and Alert procedure): Post Debridement Measurements of Total Wound Length: (cm) 0.5 Width: (cm) 1 Depth: (cm) 0.1 Volume: (cm) 0.039 Character of Wound/Ulcer Post Debridement: Improved Post Procedure Diagnosis Same as Pre-procedure Notes Scribed for Dr. Heber [place] by J.Scotton Electronic Signature(s) Signed: 10/11/2022 5:03:25 PM By: Kalman Shan DO Signed: 10/11/2022 5:50:42 PM By: Dellie Catholic RN Ronnald Ramp, Buford Dresser ([id number]) [id number] Page 2 of 8 Entered By: Dellie Catholic on 10/11/2022 15:16:53 -------------------------------------------------------------------------------- HPI Details Patient Name: Date of Service: [name], [name] 10/11/2022 2:15 PM Medical Record Number: [id number] Patient Account Number: [id number] Date of Birth/Sex: Treating RN: September 10, 1985 (37 y.o. M) Primary Care Provider: Vena Rua Other Clinician: Referring Provider: Treating Provider/Extender: Salli Quarry Weeks in Treatment: 9 History of Present Illness HPI Description: ADMISSION 08/08/2021 This is Caleb 37 year old man who underwent an extensive hospitalization from 06/23/2022 through 07/13/2022 with Caleb large necrotic area on the left inframammary area extending into the left lateral chest. The patient states this came on fairly rapidly initially with Caleb boil. This was aspirated on 12/5 and he underwent an extensive operative debridement ultimately on 12/8. Cultures showed rare Prevotella and moderate actinomyces. He initially was treated with daptomycin and Unasyn in the hospital then Augmentin and more recently he is continued on amoxicillin. [name] with Dr. Drucilla Schmidt in Caleb few days. During the course of the hospitalization he was discovered to be Caleb new onset diabetic with Caleb hemoglobin A1c  of 11.6. Since his discharge from the hospital he has been followed using Caleb wound VAC on the surgical area. There is an impressive postoperative picture in epic sewing Caleb large open surgical wound. There is been considerable improvement with the wound VAC being changed by home health. I cannot see the notes from either infectious disease or surgery Past medical history includes obesity, new onset diabetes as mentioned. He was  Caleb smoker. 1/22; patient presents for follow-up. He has been using silver alginate 3 times weekly. He has some skin irritation to the left sided periwound. He had follow-up with infectious disease, Dr. Drucilla Schmidt on 1/17. He is currently taking amoxicillin and plan is to continue this for the next 5 months. 1/29; patient presents for follow-up. He did not obtain Vashe solution. He has been using normal saline wet-to-dry dressings. He has no issues or complaints today. We gave him sample of Vashe in office. 2/5; patient presents for follow-up. [name] to the wound beds. He has had significant improvement in wound healing to the left surgical wound site. He has not heard from dermatology for referral sent. 2/19; patient presents for follow-up. [name] to the left surgical wound site. He is scheduled to see dermatology on March 5 for an area under his right breast. 3/19; patient presents for follow-up. [name] to the left sided surgical wound site. It is all but healed to the most medial aspect. There is actually what appears to be previous wound VAC sponge coming out of it. Electronic Signature(s) Signed: 10/11/2022 5:03:25 PM By: Kalman Shan DO Entered By: Kalman Shan on 10/11/2022 16:34:47 -------------------------------------------------------------------------------- Physical Exam Details Patient Name: Date of Service: [name], [name] 10/11/2022 2:15  PM Medical Record Number: [id number] Patient Account Number: [id number] Date of Birth/Sex: Treating RN: 1985/11/27 (37 y.o. M) Primary Care Provider: Vena Rua Other Clinician: Referring Provider: Treating Provider/Extender: Salli Quarry Weeks in Treatment: 9 Constitutional respirations regular, non-labored and within target range for patient.. Cardiovascular 2+ dorsalis pedis/posterior tibialis pulses. Psychiatric pleasant and cooperative. Notes Narrowed wound to the left inframamillary area extending into the left lateral chest wall. This has granulated tissue throughout with Areas of epithelization.T the o most medial aspect there is still open wound remaining. With debridement there is obvious sponge (possibly from the wound vac) coming out.. No surrounding signs of infection. Electronic Signature(s) Signed: 10/11/2022 5:03:25 PM By: Kalman Shan DO Entered By: Kalman Shan on 10/11/2022 16:36:13 [name] ([id number]) [id number] Page 3 of 8 -------------------------------------------------------------------------------- Physician Orders Details Patient Name: Date of Service: [name], [name] 10/11/2022 2:15 PM Medical Record Number: [id number] Patient Account Number: [id number] Date of Birth/Sex: Treating RN: 1986/06/16 (37 y.o. [name] Primary Care Provider: Vena Rua Other Clinician: Referring Provider: Treating Provider/Extender: Salli Quarry Weeks in Treatment: 9 Verbal / Phone Orders: No Diagnosis Coding Follow-up Appointments ppointment in 1 week. - Dr. Heber [place] Room 9 Return Caleb Anesthetic (In clinic) Topical Lidocaine 4% applied to wound bed Bathing/ Shower/ Hygiene May shower and wash wound with soap and water. Negative Presssure Wound Therapy Discontinue wound vac. Call the number on the machine for the company to pick up. Additional Orders /  Instructions Follow Nutritious Diet - Patient was given Juven samples to try Home Health No change in wound care orders this week; continue Home Health for wound care. May utilize formulary equivalent dressing for wound treatment orders unless otherwise specified. - x2 week wound care changes. wound center weekly. change to vashe wet to dry dressings to all wounds. Other Home Health Orders/Instructions: - [facility] Wound Treatment Wound #1 - Chest Wound Laterality: Left Cleanser: Soap and Water 3 x Per Week/30 Days Discharge Instructions: May shower and wash wound with dial antibacterial soap and water prior to dressing change. Cleanser:  Wound Cleanser (Home Health) 3 x Per Week/30 Days Discharge Instructions: Cleanse the wound with wound cleanser prior to applying Caleb clean dressing using gauze sponges, not tissue or cotton balls. Peri-Wound Care: Skin Prep (Home Health) 3 x Per Week/30 Days Discharge Instructions: Use skin prep as directed Peri-Wound Care: Zinc Oxide Ointment 30g tube (Home Health) 3 x Per Week/30 Days Discharge Instructions: Apply Zinc Oxide to periwound with each dressing change due to skin irritation Prim Dressing: vashe wet to dry (Home Health) 3 x Per Week/30 Days ary Discharge Instructions: apply soaked vashe gauze directly to wound bed. Secondary Dressing: ABD Pad, 8x10 (Home Health) 3 x Per Week/30 Days Discharge Instructions: Apply over primary dressing as directed. Electronic Signature(s) Signed: 10/11/2022 5:03:25 PM By: Kalman Shan DO Entered By: Kalman Shan on 10/11/2022 16:36:22 -------------------------------------------------------------------------------- Problem List Details Patient Name: Date of Service: [name], [name] 10/11/2022 2:15 PM Medical Record Number: [id number] Patient Account Number: [id number] Date of Birth/Sex: Treating RN: August 03, 1985 (37 y.o. M) Primary Care Provider: Vena Rua Other Clinician: Referring  Provider: Treating Provider/Extender: Jhonnie Garner in Treatment: [address] CLELLAN, GANGULY ([id number]) [id number] Page 4 of 8 ICD-10 Encounter Code Description Active Date MDM Diagnosis T81.31XD Disruption of external operation (surgical) wound, not elsewhere classified, 08/08/2022 No Yes subsequent encounter L02.213 Cutaneous abscess of chest wall 08/08/2022 No Yes L98.498 Non-pressure chronic ulcer of skin of other sites with other specified severity 08/08/2022 No Yes L73.2 Hidradenitis suppurativa 08/08/2022 No Yes E11.622 Type 2 diabetes mellitus with other skin ulcer 08/08/2022 No Yes Inactive Problems Resolved Problems Electronic Signature(s) Signed: 10/11/2022 5:03:25 PM By: Kalman Shan DO Entered By: Kalman Shan on 10/11/2022 16:33:39 -------------------------------------------------------------------------------- Progress Note Details Patient Name: Date of Service: [name], [name] 10/11/2022 2:15 PM Medical Record Number: [id number] Patient Account Number: [id number] Date of Birth/Sex: Treating RN: [birth year] (37 y.o. M) Primary Care Provider: Vena Rua Other Clinician: Referring Provider: Treating Provider/Extender: Jhonnie Garner in Treatment: 9 Subjective Chief Complaint Information obtained from Patient 1/52/23; patient is here predominantly for Caleb surgical wound in the left inframammary area extending into the left lateral chest. Patient also has Caleb small area on the right inframammary area probably related to hidradenitis History of Present Illness (HPI) ADMISSION 08/08/2021 This is Caleb 37 year old man who underwent an extensive hospitalization from 06/23/2022 through 07/13/2022 with Caleb large necrotic area on the left inframammary area extending into the left lateral chest. The patient states this came on fairly rapidly initially with Caleb boil. This was aspirated on  12/5 and he underwent an extensive operative debridement ultimately on 12/8. Cultures showed rare Prevotella and moderate actinomyces. He initially was treated with daptomycin and Unasyn in the hospital then Augmentin and more recently he is continued on amoxicillin. [name] with Dr. Drucilla Schmidt in Caleb few days. During the course of the hospitalization he was discovered to be Caleb new onset diabetic with Caleb hemoglobin A1c of 11.6. Since his discharge from the hospital he has been followed using Caleb wound VAC on the surgical area. There is an impressive postoperative picture in epic sewing Caleb large open surgical wound. There is been considerable improvement with the wound VAC being changed by home health. I cannot see the notes from either infectious disease or surgery Past medical history includes obesity, new onset diabetes as mentioned. He was Caleb smoker. 1/22; patient presents for follow-up. He has been using silver alginate 3 times weekly.  He has some skin irritation to the left sided periwound. He had follow-up with infectious disease, Dr. Drucilla Schmidt on 1/17. He is currently taking amoxicillin and plan is to continue this for the next 5 months. 1/29; patient presents for follow-up. He did not obtain Vashe solution. He has been using normal saline wet-to-dry dressings. He has no issues or complaints today. We gave him sample of Vashe in office. 2/5; patient presents for follow-up. [name] to the wound beds. He has had significant improvement in wound healing to the left surgical wound site. He has not heard from dermatology for referral sent. 2/19; patient presents for follow-up. [name] to the left surgical wound site. He is scheduled to see dermatology on March 5 for an area under his right breast. [name], [name] ([id number]) [id number] Page 5 of 8 3/19; patient presents for follow-up. He has been using  Vashe wet-to-dry dressings to the left sided surgical wound site. It is all but healed to the most medial aspect. There is actually what appears to be previous wound VAC sponge coming out of it. Patient History Family History Cancer - Maternal Grandparents,Paternal Grandparents, Diabetes - Mother. Social History Current every day smoker - 1 ppd, Alcohol Use - Moderate. Medical History Respiratory Patient has history of Asthma Endocrine Patient has history of Type II Diabetes Hospitalization/Surgery History - 12/8 and12/10 IandD left breast abscess. Medical Caleb Surgical History Notes nd Constitutional Symptoms (General Health) breast abscess Psychiatric anxiety Objective Constitutional respirations regular, non-labored and within target range for patient.. Vitals Time Taken: 2:37 PM, Height: 69 in, Weight: 235 lbs, BMI: 34.7, Temperature: 98.4 F, Pulse: 96 bpm, Respiratory Rate: 18 breaths/min, Blood Pressure: 144/89 mmHg, Capillary Blood Glucose: 146 mg/dl. Cardiovascular 2+ dorsalis pedis/posterior tibialis pulses. Psychiatric pleasant and cooperative. General Notes: Narrowed wound to the left inframamillary area extending into the left lateral chest wall. This has granulated tissue throughout with Areas of epithelization.T the most medial aspect there is still open wound remaining. With debridement there is obvious sponge (possibly from the wound vac) coming o out.. No surrounding signs of infection. Integumentary (Hair, Skin) Wound #1 status is Open. Original cause of wound was Surgical Injury. The date acquired was: 07/03/2022. The wound has been in treatment 9 weeks. The wound is located on the Left Chest. The wound measures 0.5cm length x 1cm width x 0.1cm depth; 0.393cm^2 area and 0.039cm^3 volume. There is Fat Layer (Subcutaneous Tissue) exposed. There is no tunneling or undermining noted. There is Caleb medium amount of purulent drainage noted. The wound margin is  distinct with the outline attached to the wound base. There is large (67-100%) red, hyper - granulation within the wound bed. There is no necrotic tissue within the wound bed. The periwound skin appearance did not exhibit: Callus, Crepitus, Excoriation, Induration, Rash, Scarring, Dry/Scaly, Maceration, Atrophie Blanche, Cyanosis, Ecchymosis, Hemosiderin Staining, Mottled, Pallor, Rubor, Erythema. Assessment Active Problems ICD-10 Disruption of external operation (surgical) wound, not elsewhere classified, subsequent encounter Cutaneous abscess of chest wall Non-pressure chronic ulcer of skin of other sites with other specified severity Hidradenitis suppurativa Type 2 diabetes mellitus with other skin ulcer Patient's wound is all but healed except for the medial aspect of the surgical incision site. Upon close review there appears to be sponge coming out of the wound bed. I debrided this area and tried to remove as much of this debris as I could. I recommended continuing Vashe wet-to-dry dressings. If area not  able to heal then will likely need to go back to general surgery for further evaluation. Procedures JACQUEES, WAGY ([id number]) [id number] Page 6 of 8 Wound #1 Pre-procedure diagnosis of Wound #1 is an Open Surgical Wound located on the Left Chest . There was Caleb Selective/Open Wound Non-Viable Tissue Debridement with Caleb total area of 0.5 sq cm performed by Kalman Shan, DO. With the following instrument(s): Curette to remove Non-Viable tissue/material. Material removed includes Other: Skin after achieving pain control using Lidocaine 5% topical ointment. No specimens were taken.Caleb Minimum amount of bleeding was controlled with Pressure. The procedure was tolerated well. The patient had Caleb pain level of 0 following the procedure. Post Debridement Measurements: 0.5cm length x 1cm width x 0.1cm depth; 0.039cm^3 volume. Character of Wound/Ulcer Post Debridement is  improved. Post procedure Diagnosis Wound #1: Same as Pre-Procedure General Notes: Scribed for Dr. Heber [place] by J.Scotton. Plan Follow-up Appointments: Return Appointment in 1 week. - Dr. Heber [place] Room 9 Anesthetic: (In clinic) Topical Lidocaine 4% applied to wound bed Bathing/ Shower/ Hygiene: May shower and wash wound with soap and water. Negative Presssure Wound Therapy: Discontinue wound vac. Call the number on the machine for the company to pick up. Additional Orders / Instructions: Follow Nutritious Diet - Patient was given Juven samples to try Home Health: No change in wound care orders this week; continue Home Health for wound care. May utilize formulary equivalent dressing for wound treatment orders unless otherwise specified. - x2 week wound care changes. wound center weekly. change to vashe wet to dry dressings to all wounds. Other Home Health Orders/Instructions: - [facility] home [facility] WOUND #1: - Chest Wound Laterality: Left Cleanser: Soap and Water 3 x Per Week/30 Days Discharge Instructions: May shower and wash wound with dial antibacterial soap and water prior to dressing change. Cleanser: Wound Cleanser (Home Health) 3 x Per Week/30 Days Discharge Instructions: Cleanse the wound with wound cleanser prior to applying Caleb clean dressing using gauze sponges, not tissue or cotton balls. Peri-Wound Care: Skin Prep (Home Health) 3 x Per Week/30 Days Discharge Instructions: Use skin prep as directed Peri-Wound Care: Zinc Oxide Ointment 30g tube (Home Health) 3 x Per Week/30 Days Discharge Instructions: Apply Zinc Oxide to periwound with each dressing change due to skin irritation Prim Dressing: vashe wet to dry (Home Health) 3 x Per Week/30 Days ary Discharge Instructions: apply soaked vashe gauze directly to wound bed. Secondary Dressing: ABD Pad, 8x10 (Home Health) 3 x Per Week/30 Days Discharge Instructions: Apply over primary dressing as directed. 1. In office sharp  debridement 2. Vashe wet-to-dry dressings 3. Follow-up in 1 week Electronic Signature(s) Signed: 10/11/2022 5:03:25 PM By: Kalman Shan DO Entered By: Kalman Shan on 10/11/2022 16:39:46 -------------------------------------------------------------------------------- HxROS Details Patient Name: Date of Service: [name], [name] 10/11/2022 2:15 PM Medical Record Number: [id number] Patient Account Number: [id number] Date of Birth/Sex: Treating RN: 1986/03/22 (37 y.o. M) Primary Care Provider: Vena Rua Other Clinician: Referring Provider: Treating Provider/Extender: Salli Quarry Weeks in Treatment: 9 Constitutional Symptoms (General Health) Medical History: Past Medical History Notes: breast abscess Respiratory Medical History: Positive for: Asthma Endocrine Medical History: Positive for: Type II Diabetes [name], [name] ([id number]) [id number] Page 7 of 8 Treated with: Insulin Psychiatric Medical History: Past Medical History Notes: anxiety Immunizations Implantable Devices No devices added Hospitalization / Surgery History Type of Hospitalization/Surgery 12/8 and12/10 IandD left breast abscess Family and Social History Cancer: Yes - Maternal Grandparents,Paternal Grandparents; Diabetes: Yes - Mother; Current every  day smoker - 1 ppd; Alcohol Use: Moderate Electronic Signature(s) Signed: 10/11/2022 5:03:25 PM By: Kalman Shan DO Entered By: Kalman Shan on 10/11/2022 16:34:53 -------------------------------------------------------------------------------- SuperBill Details Patient Name: Date of Service: [name], [name] 10/11/2022 Medical Record Number: [id number] Patient Account Number: [id number] Date of Birth/Sex: Treating RN: Sep 17, 1985 (37 y.o. M) Primary Care Provider: Vena Rua Other Clinician: Referring Provider: Treating Provider/Extender: Salli Quarry Weeks in Treatment: 9 Diagnosis Coding ICD-10 Codes Code Description T81.31XD Disruption of external operation (surgical) wound, not elsewhere classified, subsequent encounter L02.213 Cutaneous abscess of chest wall L98.498 Non-pressure chronic ulcer of skin of other sites with other specified severity L73.2 Hidradenitis suppurativa E11.622 Type 2 diabetes mellitus with other skin ulcer Facility Procedures : 7 CPT4 Code: [id number] Description: [id number] - DEBRIDE WOUND 1ST 20 SQ CM OR < ICD-10 Diagnosis Description L98.498 Non-pressure chronic ulcer of skin of other sites with other specified severity L02.213 Cutaneous abscess of chest wall E11.622 Type 2 diabetes mellitus with other  skin ulcer Modifier: Quantity: 1 Physician Procedures : CPT4 Code Description Modifier [id number] - WC PHYS DEBR WO ANESTH 20 SQ CM ICD-10 Diagnosis Description L98.498 Non-pressure chronic ulcer of skin of other sites with other specified severity L02.213 Cutaneous abscess of chest wall E11.622 Type 2  diabetes mellitus with other skin ulcer Quantity: 1 Electronic Signature(s) Signed: 10/11/2022 5:03:25 PM By: Kalman Shan DO Entered By: Kalman Shan on 10/11/2022 16:40:24 [name] ([id number]) [id number] Page 8 of 8

## 2022-10-17 ENCOUNTER — Encounter: Payer: Self-pay | Admitting: Pharmacist

## 2022-10-17 NOTE — Progress Notes (Signed)
[name], [name] ([id number]) [phone].pdf Page 1 of 6 Visit Report for 10/11/2022 Arrival Information Details Patient Name: Date of Service: [facility] [name], [name] 10/11/2022 2:15 PM Medical Record Number: [id number] Patient Account Number: [id number] Date of Birth/Sex: Treating RN: [birth year] (37 y.o. M) Primary Care Zoa Dowty: Vena Rua Other Clinician: Referring Tyris Eliot: Treating Vito Beg/Extender: Jhonnie Garner in Treatment: 9 Visit Information History Since Last Visit Added or deleted any medications: No Patient Arrived: Ambulatory Any new allergies or adverse reactions: No Arrival Time: 14:35 Had Caleb fall or experienced change in No Accompanied By: self activities of daily living that may affect Transfer Assistance: None risk of falls: Patient Identification Verified: Yes Signs or symptoms of abuse/neglect since last visito No Secondary Verification Process Completed: Yes Hospitalized since last visit: No Patient Requires Transmission-Based Precautions: No Implantable device outside of the clinic excluding No Patient Has Alerts: No cellular tissue based products placed in the center since last visit: Has Dressing in Place as Prescribed: Yes Pain Present Now: No Electronic Signature(s) Signed: 10/17/2022 4:16:21 PM By: Erenest Blank Entered By: Erenest Blank on 10/11/2022 14:37:17 -------------------------------------------------------------------------------- Encounter Discharge Information Details Patient Name: Date of Service: [name], [name] 10/11/2022 2:15 PM Medical Record Number: [id number] Patient Account Number: [id number] Date of Birth/Sex: Treating RN: 01/27/1986 (37 y.o. Collene Gobble Primary Care Ross Bender: Vena Rua Other Clinician: Referring Janaysha Depaulo: Treating Lametria Klunk/Extender: Jhonnie Garner in Treatment: 9 Encounter Discharge Information Items Post Procedure  Vitals Discharge Condition: Stable Temperature (F): 98.4 Ambulatory Status: Ambulatory Pulse (bpm): 96 Discharge Destination: Home Respiratory Rate (breaths/min): 18 Transportation: Private Auto Blood Pressure (mmHg): 144/89 Accompanied By: self Schedule Follow-up Appointment: Yes Clinical Summary of Care: Patient Declined Electronic Signature(s) Signed: 10/11/2022 5:50:42 PM By: Dellie Catholic RN Entered By: Dellie Catholic on 10/11/2022 17:49:47 -------------------------------------------------------------------------------- Lower Extremity Assessment Details Patient Name: Date of Service: [facility] [name], [name] 10/11/2022 2:15 PM Medical Record Number: [id number] Patient Account Number: [id number] Date of Birth/Sex: Treating RN: 10-28-1985 (37 y.o. M) Primary Care Florencio Hollibaugh: Vena Rua Other Clinician: Referring Hermina Barnard: Treating Emeril Stille/Extender: Salli Quarry Weeks in Treatment: 9 Electronic Signature(s) Signed: 10/17/2022 4:16:21 PM By: Casey Burkitt, Buford Dresser ([id number]) [id number] Page 2 of 6 Entered By: Erenest Blank on 10/11/2022 14:38:22 -------------------------------------------------------------------------------- Multi Wound Chart Details Patient Name: Date of Service: [facility] [name], [name] 10/11/2022 2:15 PM Medical Record Number: [id number] Patient Account Number: [id number] Date of Birth/Sex: Treating RN: [birth year] (37 y.o. M) Primary Care Jamail Cullers: Vena Rua Other Clinician: Referring Keiko Myricks: Treating Raffael Bugarin/Extender: Salli Quarry Weeks in Treatment: 9 Vital Signs Height(in): 69 Capillary Blood Glucose(mg/dl): 146 Weight(lbs): 235 Pulse(bpm): 96 Body Mass Index(BMI): 34.7 Blood Pressure(mmHg): 144/89 Temperature(F): 98.4 Respiratory Rate(breaths/min): 18 [1:Photos:] [N/Caleb:N/Caleb] Left Chest N/Caleb N/Caleb Wound Location: Surgical Injury N/Caleb N/Caleb Wounding Event: Open  Surgical Wound N/Caleb N/Caleb Primary Etiology: Asthma, Type II Diabetes N/Caleb N/Caleb Comorbid History: 07/03/2022 N/Caleb N/Caleb Date Acquired: 9 N/Caleb N/Caleb Weeks of Treatment: Open N/Caleb N/Caleb Wound Status: No N/Caleb N/Caleb Wound Recurrence: Yes N/Caleb N/Caleb Clustered Wound: 1 N/Caleb N/Caleb Clustered Quantity: 0.5x1x0.1 N/Caleb N/Caleb Measurements L x W x D (cm) 0.393 N/Caleb N/[name] (cm) : rea 0.039 N/Caleb N/Caleb Volume (cm) : 98.70% N/Caleb N/Caleb % Reduction in Caleb rea: 98.70% N/Caleb N/Caleb % Reduction in Volume: Full Thickness Without Exposed N/Caleb N/Caleb Classification: Support Structures Medium N/Caleb N/Caleb Exudate Caleb mount: Purulent N/Caleb N/Caleb Exudate Type: yellow, brown, green N/Caleb  N/Caleb Exudate Color: Distinct, outline attached N/Caleb N/Caleb Wound Margin: Large (67-100%) N/Caleb N/Caleb Granulation Caleb mount: Red, Hyper-granulation N/Caleb N/Caleb Granulation Quality: None Present (0%) N/Caleb N/Caleb Necrotic Caleb mount: Fat Layer (Subcutaneous Tissue): Yes N/Caleb N/Caleb Exposed Structures: Fascia: No Tendon: No Muscle: No Joint: No Bone: No Medium (34-66%) N/Caleb N/Caleb Epithelialization: Debridement - Selective/Open Wound N/Caleb N/Caleb Debridement: Lidocaine 5% topical ointment N/Caleb N/Caleb Pain Control: Other N/Caleb N/Caleb Tissue Debrided: Non-Viable Tissue N/Caleb N/Caleb Level: 0.5 N/Caleb N/Caleb Debridement Caleb (sq cm): rea Curette N/Caleb N/Caleb Instrument: Minimum N/Caleb N/Caleb Bleeding: Pressure N/Caleb N/Caleb Hemostasis Caleb chieved: 0 N/Caleb N/Caleb Post Procedural Pain: Debridement Treatment Response: Procedure was tolerated well N/Caleb N/Caleb Post Debridement Measurements L x 0.5x1x0.1 N/Caleb N/Caleb W x D (cm) 0.039 N/Caleb N/Caleb Post Debridement Volume: (cm) Excoriation: No N/Caleb N/Caleb Periwound Skin Texture: Induration: No Callus: No Crepitus: No Rash: No [name], [name] ([id number]) [phone].pdf Page 3 of 6 Scarring: No Maceration: No N/Caleb N/Caleb Periwound Skin Moisture: Dry/Scaly: No Atrophie Blanche: No N/Caleb N/Caleb Periwound Skin Color: Cyanosis: No Ecchymosis: No Erythema: No Hemosiderin Staining:  No Mottled: No Pallor: No Rubor: No Debridement N/Caleb N/Caleb Procedures Performed: Treatment Notes Electronic Signature(s) Signed: 10/11/2022 5:03:25 PM By: Kalman Shan DO Entered By: Kalman Shan on 10/11/2022 16:33:47 -------------------------------------------------------------------------------- Multi-Disciplinary Care Plan Details Patient Name: Date of Service: [name], [name] 10/11/2022 2:15 PM Medical Record Number: [id number] Patient Account Number: [id number] Date of Birth/Sex: Treating RN: 1985/10/31 (37 y.o. Collene Gobble Primary Care Aubrey Voong: Vena Rua Other Clinician: Referring Leander Tout: Treating Lorriane Dehart/Extender: Salli Quarry Weeks in Treatment: 9 Active Inactive Pain, Acute or Chronic Nursing Diagnoses: Pain, acute or chronic: actual or potential Potential alteration in comfort, pain Goals: Patient will verbalize adequate pain control and receive pain control interventions during procedures as needed Date Initiated: 08/08/2022 Target Resolution Date: 09/23/2023 Goal Status: Active Patient/caregiver will verbalize comfort level met Date Initiated: 08/08/2022 Target Resolution Date: 09/23/2023 Goal Status: Active Interventions: Encourage patient to take pain medications as prescribed Provide education on pain management Reposition patient for comfort Treatment Activities: Administer pain control measures as ordered : 08/08/2022 Notes: Wound/Skin Impairment Nursing Diagnoses: Knowledge deficit related to ulceration/compromised skin integrity Goals: Patient/caregiver will verbalize understanding of skin care regimen Date Initiated: 08/08/2022 Target Resolution Date: 09/23/2023 Goal Status: Active Interventions: Assess patient/caregiver ability to perform ulcer/skin care regimen upon admission and as needed Assess ulceration(s) every visit Provide education on ulcer and skin care Treatment Activities: Skin care regimen  initiated : 08/08/2022 Topical wound management initiated : 08/08/2022 [name] ([id number]) [phone].pdf Page 4 of 6 Notes: Electronic Signature(s) Signed: 10/11/2022 5:50:42 PM By: Dellie Catholic RN Entered By: Dellie Catholic on 10/11/2022 17:48:26 -------------------------------------------------------------------------------- Pain Assessment Details Patient Name: Date of Service: [name], [name] 10/11/2022 2:15 PM Medical Record Number: [id number] Patient Account Number: [id number] Date of Birth/Sex: Treating RN: [birth year] (37 y.o. M) Primary Care Oasis Goehring: Vena Rua Other Clinician: Referring Timothy Trudell: Treating Leland Staszewski/Extender: Salli Quarry Weeks in Treatment: 9 Active Problems Location of Pain Severity and Description of Pain Patient Has Paino No Site Locations Pain Management and Medication Current Pain Management: Electronic Signature(s) Signed: 10/17/2022 4:16:21 PM By: Erenest Blank Entered By: Erenest Blank on 10/11/2022 14:38:14 -------------------------------------------------------------------------------- Patient/Caregiver Education Details Patient Name: Date of Service: [facility] [name], [name] RO Haywood Filler 3/19/2024andnbsp2:15 PM Medical Record Number: [id number] Patient Account Number: [id number] Date of Birth/Gender: Treating RN: [birth year] (37 y.o. Collene Gobble Primary Care Physician: Christiana Fuchs,  Lillie Columbia Other Clinician: Referring Physician: Treating Physician/Extender: Jhonnie Garner in Treatment: 9 Education Assessment Education Provided To: Patient Education Topics Provided Wound/Skin Impairment: Methods: Explain/Verbal Responses: Return demonstration correctly [name], [name] ([id number]) [phone].pdf Page 5 of 6 Electronic Signature(s) Signed: 10/11/2022 5:50:42 PM By: Dellie Catholic RN Entered By: Dellie Catholic on 10/11/2022  17:48:39 -------------------------------------------------------------------------------- Wound Assessment Details Patient Name: Date of Service: [name], [name] 10/11/2022 2:15 PM Medical Record Number: [id number] Patient Account Number: [id number] Date of Birth/Sex: Treating RN: 04-13-1986 (37 y.o. M) Primary Care Rawlin Reaume: Vena Rua Other Clinician: Referring Emmalie Haigh: Treating Kaena Santori/Extender: Salli Quarry Weeks in Treatment: 9 Wound Status Wound Number: 1 Primary Etiology: Open Surgical Wound Wound Location: Left Chest Wound Status: Open Wounding Event: Surgical Injury Comorbid History: Asthma, Type II Diabetes Date Acquired: 07/03/2022 Weeks Of Treatment: 9 Clustered Wound: Yes Photos Wound Measurements Length: (cm) Width: (cm) Depth: (cm) Clustered Quantity: Area: (cm) Volume: (cm) 0.5 % Reduction in Area: 98.7% 1 % Reduction in Volume: 98.7% 0.1 Epithelialization: Medium (34-66%) 1 Tunneling: No 0.393 Undermining: No 0.039 Wound Description Classification: Full Thickness Without Exposed Sup Wound Margin: Distinct, outline attached Exudate Amount: Medium Exudate Type: Purulent Exudate Color: yellow, brown, green port Structures Foul Odor After Cleansing: No Slough/Fibrino No Wound Bed Granulation Amount: Large (67-100%) Exposed Structure Granulation Quality: Red, Hyper-granulation Fascia Exposed: No Necrotic Amount: None Present (0%) Fat Layer (Subcutaneous Tissue) Exposed: Yes Tendon Exposed: No Muscle Exposed: No Joint Exposed: No Bone Exposed: No Periwound Skin Texture Texture Color No Abnormalities Noted: No No Abnormalities Noted: No Callus: No Atrophie Blanche: No Crepitus: No Cyanosis: No Excoriation: No Ecchymosis: No Induration: No Erythema: No Rash: No Hemosiderin Staining: No Scarring: No Mottled: No Pallor: No Moisture [name], [name] ([id number]) [id number] Page 6 of  6 Rubor: No No Abnormalities Noted: No Dry / Scaly: No Maceration: No Treatment Notes Wound #1 (Chest) Wound Laterality: Left Cleanser Soap and Water Discharge Instruction: May shower and wash wound with dial antibacterial soap and water prior to dressing change. Wound Cleanser Discharge Instruction: Cleanse the wound with wound cleanser prior to applying Caleb clean dressing using gauze sponges, not tissue or cotton balls. Peri-Wound Care Skin Prep Discharge Instruction: Use skin prep as directed Zinc Oxide Ointment 30g tube Discharge Instruction: Apply Zinc Oxide to periwound with each dressing change due to skin irritation Topical Primary Dressing vashe wet to dry Discharge Instruction: apply soaked vashe gauze directly to wound bed. Secondary Dressing ABD Pad, 8x10 Discharge Instruction: Apply over primary dressing as directed. Secured With Compression Wrap Compression Stockings Environmental education officer) Signed: 10/17/2022 4:16:21 PM By: Erenest Blank Entered By: Erenest Blank on 10/11/2022 14:46:22 -------------------------------------------------------------------------------- Vitals Details Patient Name: Date of Service: [name], [name] 10/11/2022 2:15 PM Medical Record Number: [id number] Patient Account Number: [id number] Date of Birth/Sex: Treating RN: [birth year] (37 y.o. M) Primary Care Edan Serratore: Vena Rua Other Clinician: Referring Geremiah Fussell: Treating Moustafa Mossa/Extender: Salli Quarry Weeks in Treatment: 9 Vital Signs Time Taken: 14:37 Temperature (F): 98.4 Height (in): 69 Pulse (bpm): 96 Weight (lbs): 235 Respiratory Rate (breaths/min): 18 Body Mass Index (BMI): 34.7 Blood Pressure (mmHg): 144/89 Capillary Blood Glucose (mg/dl): 146 Reference Range: 80 - 120 mg / dl Electronic Signature(s) Signed: 10/17/2022 4:16:21 PM By: Erenest Blank Entered By: Erenest Blank on 10/11/2022 14:37:51

## 2022-10-19 ENCOUNTER — Ambulatory Visit (INDEPENDENT_AMBULATORY_CARE_PROVIDER_SITE_OTHER): Payer: No Typology Code available for payment source | Admitting: Infectious Disease

## 2022-10-19 ENCOUNTER — Encounter: Payer: Self-pay | Admitting: Infectious Disease

## 2022-10-19 ENCOUNTER — Other Ambulatory Visit: Payer: Self-pay

## 2022-10-19 VITALS — BP 139/85 | HR 112 | Resp 16 | Ht 69.0 in | Wt 237.0 lb

## 2022-10-19 DIAGNOSIS — L03313 Cellulitis of chest wall: Secondary | ICD-10-CM

## 2022-10-19 DIAGNOSIS — L304 Erythema intertrigo: Secondary | ICD-10-CM | POA: Diagnosis not present

## 2022-10-19 DIAGNOSIS — L02213 Cutaneous abscess of chest wall: Secondary | ICD-10-CM | POA: Diagnosis not present

## 2022-10-19 DIAGNOSIS — L732 Hidradenitis suppurativa: Secondary | ICD-10-CM

## 2022-10-19 DIAGNOSIS — A429 Actinomycosis, unspecified: Secondary | ICD-10-CM

## 2022-10-19 DIAGNOSIS — E1165 Type 2 diabetes mellitus with hyperglycemia: Secondary | ICD-10-CM

## 2022-10-19 MED ORDER — AMOXICILLIN 500 MG PO CAPS: 500.0000 mg | ORAL_CAPSULE | Freq: Three times a day (TID) | ORAL | 4 refills | Status: DC

## 2022-10-19 NOTE — Progress Notes (Signed)
Subjective:  Chief complaint: Follow-up for extensive chest wall infection which was polymicrobial nature but also with actinomyces having been isolated.   Patient ID: [name], male    DOB: 10/05/1985, 37 y.o.   MRN: [id number]  HPI  37 y.o. male with morbid obesity newly diagnosed noticed diabetes mellitus likely retinitis suppurativa with recurrent infections in his soft tissues in particular axilla groin now admitted  with massive soft tissue chest wall abscess sp multiple debridments.    Prevotella and actinomyces been isolated.  He was on antibiotics but later simplified to Unasyn and then Augmentin.  He was discharged with Augmentin as well as a supply of amoxicillin which she is to switch to once he completes Augmentin.  About to finish Augmentin today and switch over to amoxicillin tomorrow.  He has developed what sounds like a yeast infection in his groin.  He was  following with [facility] surgery now going to see wound care.  As I last saw him he is continue to be followed with wound care and has required further debridements of nonviable tissue.  Most recently a fragment of his wound vacuum was recovered from his wound by surgery.  He has been also seen by dermatology who gave him some injections of corticosteroids into his axilla bilaterally.  This did cause his sugars to go quite high.   He is continued on his amoxicillin.   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   Asthma    Breast abscess 06/28/2022   Chest wall abscess 08/10/2022   Intertrigo 08/10/2022    Past Surgical History:  Procedure Laterality Date   INCISION AND DRAINAGE ABSCESS Left 07/01/2022   Procedure: INCISION AND DRAINAGE ABSCESS;  Surgeon: Jovita Kussmaul, MD;  Location: [place];  Service: General;  Laterality: Left;   IRRIGATION AND DEBRIDEMENT ABSCESS Left 07/03/2022   Procedure: IRRIGATION AND DEBRIDEMENT CHEST WALL / Breast ABSCESS;  Surgeon: Georganna Skeans, MD;  Location: [place];  Service: General;  Laterality: Left;    Family History  Problem Relation Age of Onset   Diabetes Mother    Breast cancer Maternal Grandmother    Diabetes Paternal Grandmother    Diabetes Paternal Grandfather    Colon cancer Neg Hx       Social History   Socioeconomic History   Marital status: Single    Spouse name: Not on file   Number of children: 1   Years of education: Not on file   Highest education level: Not on file  Occupational History   Not on file  Tobacco Use   Smoking status: Every Day    Packs/day: .3    Types: Cigarettes   Smokeless tobacco: Never  Substance and Sexual Activity   Alcohol use: Yes    Alcohol/week: 13.0 standard drinks of alcohol    Types: 9 Cans of beer, 4 Shots of liquor per week    Comment: occasional   Drug use: Not Currently    Types: Marijuana   Sexual activity: Yes  Other Topics Concern   Not on file  Social History Narrative   Lives with his mother.    Social Determinants of Health   Financial Resource Strain: Not on file  Food Insecurity: No Food Insecurity (06/28/2022)   Hunger Vital Sign    Worried About Running Out of Food in the Last Year: Never true    Ran Out of Food in the Last Year: Never true  Transportation Needs:  No Transportation Needs (06/28/2022)   PRAPARE - Hydrologist (Medical): No    Lack of Transportation (Non-Medical): No  Physical Activity: Not on file  Stress: Not on file  Social Connections: Not on file    Allergies  Allergen Reactions   Bee Pollen    Peach [Prunus Persica]     Pt reports swelling and lip tingling as reaction     Current Outpatient Medications:    Accu-Chek FastClix Lancets MISC, Use as directed 4 times a day before meals and at bedtime, Disp: 102 each, Rfl: 0   acetaminophen (TYLENOL) 325 MG tablet, Take 2 tablets (650 mg total) by mouth every 8 (eight) hours as needed for moderate pain. (Patient not taking: Reported  on 08/10/2022), Disp: 20 tablet, Rfl: 0   amLODipine (NORVASC) 5 MG tablet, Take 1 tablet (5 mg total) by mouth daily., Disp: 90 tablet, Rfl: 0   amoxicillin (AMOXIL) 500 MG capsule, Take 1 capsule (500 mg total) by mouth 3 (three) times daily for 40 days. START TAKING on 08/11/2022 after you finish Augmentin (amoxicillin/clavulanic acid), Disp: 90 capsule, Rfl: 4   atorvastatin (LIPITOR) 10 MG tablet, Take 1 tablet (10 mg total) by mouth daily., Disp: 90 tablet, Rfl: 0   blood glucose meter kit and supplies KIT, Use as directed, Disp: 1 each, Rfl: 0   Blood Pressure KIT, Please check you blood pressure daily., Disp: 1 kit, Rfl: 0   clindamycin (CLEOCIN T) 1 % external solution, Apply topically daily. to skin underarms, Disp: 30 mL, Rfl: 0   gabapentin (NEURONTIN) 100 MG capsule, Take 1 capsule (100 mg total) by mouth 3 (three) times daily., Disp: 90 capsule, Rfl: 3   glucose blood test strip, Use as directed 4 times a day before meals and at bedtime, Disp: 100 each, Rfl: 0   ibuprofen (ADVIL) 200 MG tablet, Take 200 mg by mouth every 6 (six) hours as needed for mild pain. (Patient not taking: Reported on 09/23/2022), Disp: , Rfl:    insulin aspart (NOVOLOG) 100 UNIT/ML FlexPen, Before each meal 3 times a day, CBG 140-199 - 2 units, 200-250 - 4 units, 251-299 - 6 units,  300-349 - 8 units,  350 or above-10 units. (Patient taking differently: Before each meal 3 times a day, CBG 140-199 - 2 units, 200-250 - 4 units, 251-299 - 6 units,  300-349 - 8 units,  350 or above-10 units.), Disp: 15 mL, Rfl: 0   Insulin Lispro Prot & Lispro (HUMALOG MIX 75/25 KWIKPEN) (75-25) 100 UNIT/ML Kwikpen, Inject 15 Units into the skin 2 (two) times daily., Disp: 15 mL, Rfl: 11   Insulin Pen Needle 32G X 4 MM MISC, Use as directed for 4 times a day for insulin pens, Disp: 100 each, Rfl: 0   lisinopril (ZESTRIL) 5 MG tablet, Take 1 tablet (5 mg total) by mouth daily., Disp: 90 tablet, Rfl: 3   nystatin (MYCOSTATIN/NYSTOP) powder,  Apply 1 Application topically 3 (three) times daily. Can use as preventative powder for yeast infection (Patient not taking: Reported on 08/26/2022), Disp: 30 g, Rfl: 5   oxyCODONE 10 MG TABS, Take 0.5-1 tablets (5-10 mg total) by mouth every 6 (six) hours as needed for severe pain or moderate pain (5mg  for moderate pain, 10mg  for severe pain). (Patient not taking: Reported on 09/30/2022), Disp: 25 tablet, Rfl: 0   Probiotic Product (RISA-BID PROBIOTIC) TABS, Take 1 tablet by mouth 2 (two) times daily., Disp: 60 tablet, Rfl: 2  Current Facility-Administered Medications:    triamcinolone acetonide (KENALOG) 10 MG/ML injection 10 mg, 10 mg, Intradermal, Once, Talbot Grumbling, MD   Review of Systems  Constitutional:  Negative for activity change, appetite change, chills, diaphoresis, fatigue, fever and unexpected weight change.  HENT:  Negative for congestion, rhinorrhea, sinus pressure, sneezing, sore throat and trouble swallowing.   Eyes:  Negative for photophobia and visual disturbance.  Respiratory:  Negative for cough, chest tightness, shortness of breath, wheezing and stridor.   Cardiovascular:  Negative for chest pain, palpitations and leg swelling.  Gastrointestinal:  Negative for abdominal distention, abdominal pain, anal bleeding, blood in stool, constipation, diarrhea, nausea and vomiting.  Genitourinary:  Negative for difficulty urinating, dysuria, flank pain and hematuria.  Musculoskeletal:  Negative for arthralgias, back pain, gait problem, joint swelling and myalgias.  Skin:  Positive for wound. Negative for color change, pallor and rash.  Neurological:  Negative for dizziness, tremors, weakness and light-headedness.  Hematological:  Negative for adenopathy. Does not bruise/bleed easily.  Psychiatric/Behavioral:  Negative for agitation, behavioral problems, confusion, decreased concentration, dysphoric mood and sleep disturbance.        Objective:   Physical Exam Constitutional:       Appearance: He is well-developed.  HENT:     Head: Normocephalic and atraumatic.  Eyes:     Conjunctiva/sclera: Conjunctivae normal.  Cardiovascular:     Rate and Rhythm: Normal rate and regular rhythm.  Pulmonary:     Effort: Pulmonary effort is normal. No respiratory distress.     Breath sounds: No wheezing.  Abdominal:     General: There is no distension.     Palpations: Abdomen is soft.  Musculoskeletal:        General: No tenderness. Normal range of motion.     Cervical back: Normal range of motion and neck supple.  Skin:    General: Skin is warm and dry.     Coloration: Skin is not pale.     Findings: No erythema or rash.  Neurological:     General: No focal deficit present.     Mental Status: He is alert and oriented to person, place, and time.  Psychiatric:        Mood and Affect: Mood normal.        Behavior: Behavior normal.        Thought Content: Thought content normal.        Judgment: Judgment normal.   Chest wall wound 10/19/2022:          Assessment & Plan:  Extensive chest wall infection that was polymicrobial with also actinomyces having been isolated:  Will continue with amoxicillin  and will to go minimum of 6 months postoperatively, essentially longer given the fact that a retained wound vacuum dressing in the wound.   Intertrigo: Responded to fluconazole and now more recently to prompt initiation of nystatin powder  Hidradenitis suppurativa going to follow-up with dermatology on June 5  Diabetes mellitus: to followup with PCP

## 2022-10-21 ENCOUNTER — Other Ambulatory Visit: Payer: Self-pay | Admitting: Nurse Practitioner

## 2022-10-21 ENCOUNTER — Encounter (HOSPITAL_BASED_OUTPATIENT_CLINIC_OR_DEPARTMENT_OTHER): Payer: No Typology Code available for payment source | Admitting: Internal Medicine

## 2022-10-21 DIAGNOSIS — L02213 Cutaneous abscess of chest wall: Secondary | ICD-10-CM | POA: Diagnosis not present

## 2022-10-21 DIAGNOSIS — E782 Mixed hyperlipidemia: Secondary | ICD-10-CM

## 2022-10-21 DIAGNOSIS — E11622 Type 2 diabetes mellitus with other skin ulcer: Secondary | ICD-10-CM

## 2022-10-21 DIAGNOSIS — L98498 Non-pressure chronic ulcer of skin of other sites with other specified severity: Secondary | ICD-10-CM | POA: Diagnosis not present

## 2022-10-21 DIAGNOSIS — T8131XD Disruption of external operation (surgical) wound, not elsewhere classified, subsequent encounter: Secondary | ICD-10-CM

## 2022-10-21 NOTE — Progress Notes (Signed)
[name], [name] ([id number]) [id number] Page 1 of 7 Visit Report for 10/21/2022 Chief Complaint Document Details Patient Name: Date of Service: [facility] [name], [name]. 10/21/2022 10:15 Caleb M Medical Record Number: [id number] Patient Account Number: [id number] Date of Birth/Sex: Treating RN: 06-26-1986 (37 y.o. M) Primary Care Provider: Vena Rua Other Clinician: Referring Provider: Treating Provider/Extender: Jhonnie Garner in Treatment: 10 Information Obtained from: Patient Chief Complaint 1/52/23; patient is here predominantly for Caleb surgical wound in the left inframammary area extending into the left lateral chest. Patient also has Caleb small area on the right inframammary area probably related to hidradenitis Electronic Signature(s) Signed: 10/21/2022 12:14:37 PM By: Kalman Shan DO Entered By: Kalman Shan on 10/21/2022 10:31:53 -------------------------------------------------------------------------------- HPI Details Patient Name: Date of Service: [name], [name]. 10/21/2022 10:15 Caleb M Medical Record Number: [id number] Patient Account Number: [id number] Date of Birth/Sex: Treating RN: 12-26-1985 (37 y.o. M) Primary Care Provider: Vena Rua Other Clinician: Referring Provider: Treating Provider/Extender: Salli Quarry Weeks in Treatment: 10 History of Present Illness HPI Description: ADMISSION 08/08/2021 This is Caleb 37 year old man who underwent an extensive hospitalization from 06/23/2022 through 07/13/2022 with Caleb large necrotic area on the left inframammary area extending into the left lateral chest. The patient states this came on fairly rapidly initially with Caleb boil. This was aspirated on 12/5 and he underwent an extensive operative debridement ultimately on 12/8. Cultures showed rare Prevotella and moderate actinomyces. He initially was treated with daptomycin and Unasyn in the hospital then  Augmentin and more recently he is continued on amoxicillin. [name] with Dr. Drucilla Schmidt in Caleb few days. During the course of the hospitalization he was discovered to be Caleb new onset diabetic with Caleb hemoglobin A1c of 11.6. Since his discharge from the hospital he has been followed using Caleb wound VAC on the surgical area. There is an impressive postoperative picture in epic sewing Caleb large open surgical wound. There is been considerable improvement with the wound VAC being changed by home health. I cannot see the notes from either infectious disease or surgery Past medical history includes obesity, new onset diabetes as mentioned. He was Caleb smoker. 1/22; patient presents for follow-up. He has been using silver alginate 3 times weekly. He has some skin irritation to the left sided periwound. He had follow-up with infectious disease, Dr. Drucilla Schmidt on 1/17. He is currently taking amoxicillin and plan is to continue this for the next 5 months. 1/29; patient presents for follow-up. He did not obtain Vashe solution. He has been using normal saline wet-to-dry dressings. He has no issues or complaints today. We gave him sample of Vashe in office. 2/5; patient presents for follow-up. [name] to the wound beds. He has had significant improvement in wound healing to the left surgical wound site. He has not heard from dermatology for referral sent. 2/19; patient presents for follow-up. [name] to the left surgical wound site. He is scheduled to see dermatology on March 5 for an area under his right breast. 3/19; patient presents for follow-up. [name] to the left sided surgical wound site. It is all but healed to the most medial aspect. There is actually what appears to be previous wound VAC sponge coming out of it. 3/29; patient presents for follow-up. [name]. Small  areas still remain open. No VAC  sponge noted today. Electronic Signature(s) Signed: 10/21/2022 12:14:37 PM By: Kalman Shan DO Entered By: Kalman Shan on 10/21/2022 10:32:21 [name] ([id number] Page 2 of 7 -------------------------------------------------------------------------------- Physical Exam Details Patient Name: Date of Service: [facility] [name], [name]. 10/21/2022 10:15 Caleb M Medical Record Number: [id number] Patient Account Number: [id number] Date of Birth/Sex: Treating RN: September 27, 1985 (37 y.o. M) Primary Care Provider: Vena Rua Other Clinician: Referring Provider: Treating Provider/Extender: Salli Quarry Weeks in Treatment: 10 Constitutional respirations regular, non-labored and within target range for patient.. Cardiovascular 2+ dorsalis pedis/posterior tibialis pulses. Psychiatric pleasant and cooperative. Notes Narrowed wound to the left inframamillary area extending into the left lateral chest wall. This has granulated tissue throughout with Areas of epithelization.T the o most medial aspect there is still open wound remaining. Granulation tissue noted at the opening. No surrounding signs of infection. Electronic Signature(s) Signed: 10/21/2022 12:14:37 PM By: Kalman Shan DO Entered By: Kalman Shan on 10/21/2022 10:33:31 -------------------------------------------------------------------------------- Physician Orders Details Patient Name: Date of Service: [name], [name]. 10/21/2022 10:15 Caleb M Medical Record Number: [id number] Patient Account Number: [id number] Date of Birth/Sex: Treating RN: [birth year] (37 y.o. [name]) Primary Care Provider: Vena Rua Other Clinician: Referring Provider: Treating Provider/Extender: Salli Quarry Weeks in Treatment: 10 Verbal / Phone Orders: No Diagnosis Coding Follow-up Appointments ppointment in 2 weeks.  - Dr. Heber Viola 11/03/2022 230pm room 7 Thursday Return Caleb Anesthetic (In clinic) Topical Lidocaine 4% applied to wound bed Bathing/ Shower/ Hygiene May shower and wash wound with soap and water. Negative Presssure Wound Therapy Discontinue wound vac. Call the number on the machine for the company to pick up. Additional Orders / Instructions Follow Nutritious Diet - Patient was given Juven samples to try Home Health No change in wound care orders this week; continue Home Health for wound care. May utilize formulary equivalent dressing for wound treatment orders unless otherwise specified. - x2 week wound care changes. wound center weekly. change to vashe wet to dry dressings to all wounds. Other Home Health Orders/Instructions: - [facility] Wound Treatment Wound #1 - Chest Wound Laterality: Left Cleanser: Soap and Water 1 x Per Day/30 Days Discharge Instructions: May shower and wash wound with dial antibacterial soap and water prior to dressing change. Cleanser: Wound Cleanser (Home Health) 1 x Per Day/30 Days Discharge Instructions: Cleanse the wound with wound cleanser prior to applying Caleb clean dressing using gauze sponges, not tissue or cotton balls. Peri-Wound Care: Skin Prep (Home Health) 1 x Per Day/30 Days Discharge Instructions: Use skin prep as directed Peri-Wound Care: Zinc Oxide Ointment 30g tube (Home Health) 1 x Per Day/30 Days Discharge Instructions: Apply Zinc Oxide to periwound with each dressing change due to skin irritation TREW, DESORBO ([id number]) [phone].pdf Page 3 of 7 Prim Dressing: vashe wet to dry (Home Health) 1 x Per Day/30 Days ary Discharge Instructions: apply soaked vashe gauze directly to wound bed. Secondary Dressing: ABD Pad, 8x10 (Home Health) 1 x Per Day/30 Days Discharge Instructions: Apply over primary dressing as directed. Electronic Signature(s) Signed: 10/21/2022 12:14:37 PM By: Kalman Shan DO Entered By:  Kalman Shan on 10/21/2022 10:33:52 -------------------------------------------------------------------------------- Problem List Details Patient Name: Date of Service: [name], [name]. 10/21/2022 10:15 Caleb M Medical Record Number: [id number] Patient Account Number: [id number] Date of Birth/Sex: Treating RN: Apr 18, 1986 (37 y.o. M) Primary Care Provider: Vena Rua Other Clinician: Referring Provider: Treating Provider/Extender: Salli Quarry Weeks in  Treatment: 10 Active Problems ICD-10 Encounter Code Description Active Date MDM Diagnosis T81.31XD Disruption of external operation (surgical) wound, not elsewhere classified, 08/08/2022 No Yes subsequent encounter L02.213 Cutaneous abscess of chest wall 08/08/2022 No Yes L98.498 Non-pressure chronic ulcer of skin of other sites with other specified severity 08/08/2022 No Yes L73.2 Hidradenitis suppurativa 08/08/2022 No Yes E11.622 Type 2 diabetes mellitus with other skin ulcer 08/08/2022 No Yes Inactive Problems Resolved Problems Electronic Signature(s) Signed: 10/21/2022 12:14:37 PM By: Kalman Shan DO Entered By: Kalman Shan on 10/21/2022 10:30:22 -------------------------------------------------------------------------------- Progress Note Details Patient Name: Date of Service: [name], [name]. 10/21/2022 10:15 Caleb M Medical Record Number: [id number] Patient Account Number: [id number] Date of Birth/Sex: Treating RN: [birth year] (37 y.o. M) Primary Care Provider: Vena Rua Other Clinician: Referring Provider: Treating Provider/Extender: Jhonnie Garner in Treatment: [address] SCHAWN, TANKSLEY ([id number]) [id number] Page 4 of 7 Chief Complaint Information obtained from Patient 1/52/23; patient is here predominantly for Caleb surgical wound in the left inframammary area extending into the left lateral chest. Patient also has Caleb small area  on the right inframammary area probably related to hidradenitis History of Present Illness (HPI) ADMISSION 08/08/2021 This is Caleb 37 year old man who underwent an extensive hospitalization from 06/23/2022 through 07/13/2022 with Caleb large necrotic area on the left inframammary area extending into the left lateral chest. The patient states this came on fairly rapidly initially with Caleb boil. This was aspirated on 12/5 and he underwent an extensive operative debridement ultimately on 12/8. Cultures showed rare Prevotella and moderate actinomyces. He initially was treated with daptomycin and Unasyn in the hospital then Augmentin and more recently he is continued on amoxicillin. [name] with Dr. Drucilla Schmidt in Caleb few days. During the course of the hospitalization he was discovered to be Caleb new onset diabetic with Caleb hemoglobin A1c of 11.6. Since his discharge from the hospital he has been followed using Caleb wound VAC on the surgical area. There is an impressive postoperative picture in epic sewing Caleb large open surgical wound. There is been considerable improvement with the wound VAC being changed by home health. I cannot see the notes from either infectious disease or surgery Past medical history includes obesity, new onset diabetes as mentioned. He was Caleb smoker. 1/22; patient presents for follow-up. He has been using silver alginate 3 times weekly. He has some skin irritation to the left sided periwound. He had follow-up with infectious disease, Dr. Drucilla Schmidt on 1/17. He is currently taking amoxicillin and plan is to continue this for the next 5 months. 1/29; patient presents for follow-up. He did not obtain Vashe solution. He has been using normal saline wet-to-dry dressings. He has no issues or complaints today. We gave him sample of Vashe in office. 2/5; patient presents for follow-up. [name] to the wound beds. He has had significant improvement in wound healing to the  left surgical wound site. He has not heard from dermatology for referral sent. 2/19; patient presents for follow-up. [name] to the left surgical wound site. He is scheduled to see dermatology on March 5 for an area under his right breast. 3/19; patient presents for follow-up. [name] to the left sided surgical wound site. It is all but healed to the most medial aspect. There is actually what appears to be previous wound VAC sponge coming out of it. 3/29; patient presents for  follow-up. [name]. Small areas still remain open. No VAC sponge noted today. Patient History Family History Cancer - Maternal Grandparents,Paternal Grandparents, Diabetes - Mother. Social History Current every day smoker - 1 ppd, Alcohol Use - Moderate. Medical History Respiratory Patient has history of Asthma Endocrine Patient has history of Type II Diabetes Hospitalization/Surgery History - 12/8 and12/10 IandD left breast abscess. Medical Caleb Surgical History Notes nd Constitutional Symptoms (General Health) breast abscess Psychiatric anxiety Objective Constitutional respirations regular, non-labored and within target range for patient.. Vitals Time Taken: 10:13 AM, Height: 69 in, Weight: 235 lbs, BMI: 34.7, Temperature: 98.2 F, Pulse: 102 bpm, Respiratory Rate: 18 breaths/min, Blood Pressure: 153/94 mmHg, Capillary Blood Glucose: 136 mg/dl. Cardiovascular 2+ dorsalis pedis/posterior tibialis pulses. Psychiatric pleasant and cooperative. General Notes: Narrowed wound to the left inframamillary area extending into the left lateral chest wall. This has granulated tissue throughout with Areas of epithelization.T the most medial aspect there is still open wound remaining. Granulation tissue noted at the opening. No surrounding signs of infection. o Integumentary (Hair, Skin) Wound #1 status is Open.  Original cause of wound was Surgical Injury. The date acquired was: 07/03/2022. The wound has been in treatment 10 weeks. The JOHNDANIEL, MAJKOWSKI ([id number]) [phone].pdf Page 5 of 7 wound is located on the Left Chest. The wound measures 0.4cm length x 0.7cm width x 0.2cm depth; 0.22cm^2 area and 0.044cm^3 volume. There is Fat Layer (Subcutaneous Tissue) exposed. There is no tunneling or undermining noted. There is Caleb medium amount of serosanguineous drainage noted. The wound margin is distinct with the outline attached to the wound base. There is large (67-100%) red, hyper - granulation within the wound bed. There is no necrotic tissue within the wound bed. The periwound skin appearance did not exhibit: Callus, Crepitus, Excoriation, Induration, Rash, Scarring, Dry/Scaly, Maceration, Atrophie Blanche, Cyanosis, Ecchymosis, Hemosiderin Staining, Mottled, Pallor, Rubor, Erythema. Assessment Active Problems ICD-10 Disruption of external operation (surgical) wound, not elsewhere classified, subsequent encounter Cutaneous abscess of chest wall Non-pressure chronic ulcer of skin of other sites with other specified severity Hidradenitis suppurativa Type 2 diabetes mellitus with other skin ulcer Patient's wound appears smaller today. No Incorporated sponge noted. No signs of infection. I recommended continuing the course with Vashe wet-to-dry dressings. Follow-up in 2 weeks. Patient is to call with any questions or concerns. Plan Follow-up Appointments: Return Appointment in 2 weeks. - Dr. Heber Langhorne 11/03/2022 230pm room 7 Thursday Anesthetic: (In clinic) Topical Lidocaine 4% applied to wound bed Bathing/ Shower/ Hygiene: May shower and wash wound with soap and water. Negative Presssure Wound Therapy: Discontinue wound vac. Call the number on the machine for the company to pick up. Additional Orders / Instructions: Follow Nutritious Diet - Patient was given Juven samples to  try Home Health: No change in wound care orders this week; continue Home Health for wound care. May utilize formulary equivalent dressing for wound treatment orders unless otherwise specified. - x2 week wound care changes. wound center weekly. change to vashe wet to dry dressings to all wounds. Other Home Health Orders/Instructions: - [facility] WOUND #1: - Chest Wound Laterality: Left Cleanser: Soap and Water 1 x Per Day/30 Days Discharge Instructions: May shower and wash wound with dial antibacterial soap and water prior to dressing change. Cleanser: Wound Cleanser (Home Health) 1 x Per Day/30 Days Discharge Instructions: Cleanse the wound with wound cleanser prior to applying Caleb clean dressing using gauze sponges, not tissue or cotton balls. Peri-Wound Care: Skin Prep (  Home Health) 1 x Per Day/30 Days Discharge Instructions: Use skin prep as directed Peri-Wound Care: Zinc Oxide Ointment 30g tube (Home Health) 1 x Per Day/30 Days Discharge Instructions: Apply Zinc Oxide to periwound with each dressing change due to skin irritation Prim Dressing: vashe wet to dry (Home Health) 1 x Per Day/30 Days ary Discharge Instructions: apply soaked vashe gauze directly to wound bed. Secondary Dressing: ABD Pad, 8x10 (Home Health) 1 x Per Day/30 Days Discharge Instructions: Apply over primary dressing as directed. 1. Vashe wet-to-dry dressings 2. Follow-up in 2 weeks Electronic Signature(s) Signed: 10/21/2022 12:14:37 PM By: Kalman Shan DO Entered By: Kalman Shan on 10/21/2022 10:35:37 -------------------------------------------------------------------------------- HxROS Details Patient Name: Date of Service: [name], [name]. 10/21/2022 10:15 Caleb M Medical Record Number: [id number] Patient Account Number: [id number] Date of Birth/Sex: Treating RN: May 29, 1986 (37 y.o. M) Primary Care Provider: Vena Rua Other Clinician: Referring Provider: Treating Provider/Extender:  Salli Quarry Weeks in Treatment: 10 Constitutional Symptoms (General Health) Medical History: ELBIE, WICHERS ([id number]) [id number].pdf Page 6 of 7 Past Medical History Notes: breast abscess Respiratory Medical History: Positive for: Asthma Endocrine Medical History: Positive for: Type II Diabetes Treated with: Insulin Psychiatric Medical History: Past Medical History Notes: anxiety Immunizations Implantable Devices No devices added Hospitalization / Surgery History Type of Hospitalization/Surgery 12/8 and12/10 IandD left breast abscess Family and Social History Cancer: Yes - Maternal Grandparents,Paternal Grandparents; Diabetes: Yes - Mother; Current every day smoker - 1 ppd; Alcohol Use: Moderate Electronic Signature(s) Signed: 10/21/2022 12:14:37 PM By: Kalman Shan DO Entered By: Kalman Shan on 10/21/2022 10:32:30 -------------------------------------------------------------------------------- SuperBill Details Patient Name: Date of Service: [name], [name]. 10/21/2022 Medical Record Number: [id number] Patient Account Number: [id number] Date of Birth/Sex: Treating RN: January 02, 1986 (37 y.o. Lorette Ang, Meta.Reding Primary Care Provider: Vena Rua Other Clinician: Referring Provider: Treating Provider/Extender: Salli Quarry Weeks in Treatment: 10 Diagnosis Coding ICD-10 Codes Code Description T81.31XD Disruption of external operation (surgical) wound, not elsewhere classified, subsequent encounter L02.213 Cutaneous abscess of chest wall L98.498 Non-pressure chronic ulcer of skin of other sites with other specified severity L73.2 Hidradenitis suppurativa E11.622 Type 2 diabetes mellitus with other skin ulcer Facility Procedures : CPT4 Code: [id number] Description: 99213 - WOUND CARE VISIT-LEV 3 EST PT Modifier: Quantity: 1 Physician Procedures : CPT4 Code Description Modifier [id number]  [id number] - WC PHYS LEVEL 3 - EST PT ICD-10 Diagnosis Description T81.31XD Disruption of external operation (surgical) wound, not elsewhere classified, subsequent encounter L98.498 Non-pressure chronic ulcer of skin  of other sites with other specified severity GARIN, KOPERSKI ([id number]) [phone].pdf Pag [phone] Type 2 diabetes mellitus with other skin ulcer L02.213 Cutaneous abscess of chest wall Quantity: 1 e 7 of 7 Electronic Signature(s) Signed: 10/21/2022 12:14:37 PM By: Kalman Shan DO Entered By: Kalman Shan on 10/21/2022 10:37:01

## 2022-10-22 NOTE — Progress Notes (Addendum)
SERAPHIM, [name] ([id number]) [id number] Page 1 of 8 Visit Report for 10/21/2022 Arrival Information Details Patient Name: Date of Service: [facility] [name], [name] 10/21/2022 [name]:15 A M Medical Record Number: [id number] Patient Account Number: [id number] Date of Birth/Sex: Treating RN: [birth year]-[name][birth year] (37 y.o. M) Primary Care [name]: [name] Other Clinician: Referring [name]: Treating [name]/Extender: [name] Visit Information History Since Last Visit Added or deleted any medications: No Patient Arrived: Ambulatory Any new allergies or adverse reactions: No Arrival Time: [name]:12 Had a fall or experienced change in No Accompanied By: self activities of daily living that may affect Transfer Assistance: None risk of falls: Patient Identification Verified: Yes Signs or symptoms of abuse/neglect since last visito No Secondary Verification Process Completed: Yes Hospitalized since last visit: No Patient Requires Transmission-Based Precautions: No Implantable device outside of the clinic excluding No Patient Has Alerts: No cellular tissue based products placed in the center since last visit: Has Dressing in Place as Prescribed: Yes Pain Present Now: No Electronic Signature(s) Signed: 10/21/2022 11:27:45 AM By: Sandre Kitty Entered By: Sandre Kitty on 10/21/2022 [name]:13:14 -------------------------------------------------------------------------------- Clinic Level of Care Assessment Details Patient Name: Date of Service: [facility] [name], [name] 10/21/2022 [name]:15 A M Medical Record Number: [id number] Patient Account Number: [id number] Date of Birth/Sex: Treating RN: [name] (37 y.o. [name] Primary Care [name]: [name] Other Clinician: Referring [name]: Treating [name]/Extender: [name] Clinic Level of Care Assessment Items TOOL 4  Quantity Score X- 1 0 Use when only an EandM is performed on FOLLOW-UP visit ASSESSMENTS - Nursing Assessment / Reassessment X- 1 [name] Reassessment of Co-morbidities (includes updates in patient status) X- 1 5 Reassessment of Adherence to Treatment Plan ASSESSMENTS - Wound and Skin A ssessment / Reassessment X - Simple Wound Assessment / Reassessment - one wound 1 5 []  - 0 Complex Wound Assessment / Reassessment - multiple wounds []  - 0 Dermatologic / Skin Assessment (not related to wound area) ASSESSMENTS - Focused Assessment []  - 0 Circumferential Edema Measurements - multi extremities []  - 0 Nutritional Assessment / Counseling / Intervention []  - 0 Lower Extremity Assessment (monofilament, tuning fork, pulses) []  - 0 Peripheral Arterial Disease Assessment (using hand held doppler) ASSESSMENTS - Ostomy and/or Continence Assessment and Care []  - 0 Incontinence Assessment and Management []  - 0 Ostomy Care Assessment and Management (repouching, etc.) PROCESS - Coordination of Care X - Simple Patient / Family Education for ongoing care 1 15 [name] ([id number]) [phone] Page 2 of 8 []  - 0 Complex (extensive) Patient / Family Education for ongoing care X- 1 [name] Staff obtains Programmer, systems, Records, T Results / Process Orders est []  - 0 Staff telephones HHA, Nursing Homes / Clarify orders / etc []  - 0 Routine Transfer to another Facility (non-emergent condition) []  - 0 Routine Hospital Admission (non-emergent condition) []  - 0 New Admissions / Biomedical engineer / Ordering NPWT Apligraf, etc. , []  - 0 Emergency Hospital Admission (emergent condition) X- 1 [name] Simple Discharge Coordination []  - 0 Complex (extensive) Discharge Coordination PROCESS - Special Needs []  - 0 Pediatric / Minor Patient Management []  - 0 Isolation Patient Management []  - 0 Hearing / Language / Visual special needs []  - 0 Assessment of Community assistance  (transportation, D/C planning, etc.) []  - 0 Additional assistance / Altered mentation []  - 0 Support Surface(s) Assessment (bed, cushion, seat, etc.) INTERVENTIONS - Wound Cleansing / Measurement X -  Simple Wound Cleansing - one wound 1 5 []  - 0 Complex Wound Cleansing - multiple wounds X- 1 5 Wound Imaging (photographs - any number of wounds) []  - 0 Wound Tracing (instead of photographs) X- 1 5 Simple Wound Measurement - one wound []  - 0 Complex Wound Measurement - multiple wounds INTERVENTIONS - Wound Dressings X - Small Wound Dressing one or multiple wounds 1 [name] []  - 0 Medium Wound Dressing one or multiple wounds []  - 0 Large Wound Dressing one or multiple wounds []  - 0 Application of Medications - topical []  - 0 Application of Medications - injection INTERVENTIONS - Miscellaneous []  - 0 External ear exam []  - 0 Specimen Collection (cultures, biopsies, blood, body fluids, etc.) []  - 0 Specimen(s) / Culture(s) sent or taken to Lab for analysis []  - 0 Patient Transfer (multiple staff / Nurse, adult / Similar devices) []  - 0 Simple Staple / Suture removal (25 or less) []  - 0 Complex Staple / Suture removal (26 or more) []  - 0 Hypo / Hyperglycemic Management (close monitor of Blood Glucose) []  - 0 Ankle / Brachial Index (ABI) - do not check if billed separately X- 1 5 Vital Signs Has the patient been seen at the hospital within the last three years: Yes Total Score: 85 Level Of Care: New/Established - Level 3 Electronic Signature(s) Signed: 10/21/2022 4:18:03 PM By: Caleb Stall RN, BSN Entered By: [name] on 10/21/2022 [name]:30:11 [name] ([id number]) [id number] Page 3 of 8 -------------------------------------------------------------------------------- Encounter Discharge Information Details Patient Name: Date of Service: [facility] [name], [name] 10/21/2022 [name]:15 A M Medical Record Number: [id number] Patient Account Number:  [id number] Date of Birth/Sex: Treating RN: [birth year][name] (37 y.o. [name] Primary Care [name]: [name] Other Clinician: Referring [name]: Treating [name]/Extender: Caleb Michaels in Treatment: [name] Encounter Discharge Information Items Discharge Condition: Stable Ambulatory Status: Ambulatory Discharge Destination: Home Transportation: Private Auto Accompanied By: self Schedule Follow-up Appointment: Yes Clinical Summary of Care: Patient Declined Electronic Signature(s) Signed: 11/10/2022 1:55:51 PM By: Brenton Grills Previous Signature: 10/21/2022 4:18:03 PM Version By: Caleb Stall RN, BSN Entered By: Brenton Grills on 11/04/2022 11:34:58 -------------------------------------------------------------------------------- Lower Extremity Assessment Details Patient Name: Date of Service: [facility] [name], [name] 10/21/2022 [name]:15 A M Medical Record Number: [id number] Patient Account Number: [id number] Date of Birth/Sex: Treating RN: [birth year][name] (37 y.o. [name] Primary Care Woody Kronberg: [name] Other Clinician: Referring Mclane Arora: Treating Weston Kallman/Extender: [name] Weeks in Treatment: [name] Electronic Signature(s) Signed: 10/21/2022 4:18:03 PM By: Caleb Stall RN, BSN Entered By: [name] on 10/21/2022 [name]:24:51 -------------------------------------------------------------------------------- Multi Wound Chart Details Patient Name: Date of Service: [name], [name] 10/21/2022 [name]:15 A M Medical Record Number: [id number] Patient Account Number: [id number] Date of Birth/Sex: Treating RN: [name]-[birth year] (37 y.o. M) Primary Care Dayron Odland: [name] Other Clinician: Referring Lillianna Sabel: Treating Roxanna Mcever/Extender: [name] Weeks in Treatment: [name] Vital Signs Height(in): 69 Capillary Blood Glucose(mg/dl): 387 Weight(lbs): 564 Pulse(bpm): 102 Body Mass Index(BMI): 34.7 Blood  Pressure(mmHg): 153/94 Temperature(F): 98.2 Respiratory Rate(breaths/min): 18 [1:Photos:] [N/A:N/A] JASAUN, CARN ([id number]) [1:Left Chest Wound Location: Surgical Injury Wounding Event: Open Surgical Wound Primary Etiology: Asthma, Type II Diabetes Comorbid History: 12/[name]/2023 Date Acquired: [name] Weeks of Treatment: Open Wound Status: No Wound Recurrence: Yes Clustered Wound: 1  Clustered Quantity: 0.4x0.7x0.2 Measurements L x W x D (cm) 0.22 A (cm) : rea 0.044 Volume (cm) : 99.30% % Reduction in A rea: 98.50% %  Reduction in Volume: Full Thickness Without Exposed Classification: Support Structures Medium Exudate Amount:  Serosanguineous Exudate Type: red, brown Exudate Color: Distinct, outline attached Wound Margin: Large (67-100%) Granulation Amount: Red, Hyper-granulation Granulation Quality: None Present (0%) Necrotic Amount: Fat Layer (Subcutaneous Tissue): Yes N/A  Exposed Structures: Fascia: No Tendon: No Muscle: No Joint: No Bone: No Medium (34-66%) Epithelialization: Excoriation: No Periwound Skin Texture: Induration: No Callus: No Crepitus: No Rash: No Scarring: No Maceration: No Periwound Skin Moisture:  Dry/Scaly: No Atrophie Blanche: No Periwound Skin Color: Cyanosis: No Ecchymosis: No Erythema: No Hemosiderin Staining: No Mottled: No Pallor: No Rubor: No] [N/A:N/A N/A N/A N/A N/A N/A N/A N/A N/A N/A N/A N/A N/A N/A N/A N/A N/A N/A N/A N/A N/A N/A N/A  N/A N/A N/A N/A] Treatment Notes Wound #1 (Chest) Wound Laterality: Left Cleanser Soap and Water Discharge Instruction: May shower and wash wound with dial antibacterial soap and water prior to dressing change. Wound Cleanser Discharge Instruction: Cleanse the wound with wound cleanser prior to applying a clean dressing using gauze sponges, not tissue or cotton balls. Peri-Wound Care Skin Prep Discharge Instruction: Use skin prep as directed Zinc Oxide Ointment 30g tube Discharge Instruction: Apply Zinc Oxide to periwound with  each dressing change due to skin irritation Topical Primary Dressing vashe wet to dry Discharge Instruction: apply soaked vashe gauze directly to wound bed. Secondary Dressing ABD Pad, 8x10 Discharge Instruction: Apply over primary dressing as directed. Secured With Compression Wrap Compression Stockings Add-Ons ELICK, AGUILERA ([id number]) [id number].pdf Page 5 of 8 Electronic Signature(s) Signed: 10/21/2022 12:14:37 PM By: Geralyn Corwin DO Entered By: Geralyn Corwin on 10/21/2022 [name]:30:45 -------------------------------------------------------------------------------- Multi-Disciplinary Care Plan Details Patient Name: Date of Service: [facility] [name], [name] 10/21/2022 [name]:15 A M Medical Record Number: [id number] Patient Account Number: [id number] Date of Birth/Sex: Treating RN: [birth year]-[name] (37 y.o. [name] Primary Care Zeppelin Commisso: [name] Other Clinician: Referring Valentin Benney: Treating Bonner Larue/Extender: [name] Weeks in Treatment: [name] Active Inactive Pain, Acute or Chronic Nursing Diagnoses: Pain, acute or chronic: actual or potential Potential alteration in comfort, pain Goals: Patient will verbalize adequate pain control and receive pain control interventions during procedures as needed Date Initiated: 08/08/2022 Target Resolution Date: 11/24/2023 Goal Status: Active Patient/caregiver will verbalize comfort level met Date Initiated: 08/08/2022 Target Resolution Date: 11/24/2023 Goal Status: Active Interventions: Encourage patient to take pain medications as prescribed Provide education on pain management Reposition patient for comfort Treatment Activities: Administer pain control measures as ordered : 08/08/2022 Notes: Wound/Skin Impairment Nursing Diagnoses: Knowledge deficit related to ulceration/compromised skin integrity Goals: Patient/caregiver will verbalize understanding of skin care regimen Date  Initiated: 08/08/2022 Target Resolution Date: 11/24/2023 Goal Status: Active Interventions: Assess patient/caregiver ability to perform ulcer/skin care regimen upon admission and as needed Assess ulceration(s) every visit Provide education on ulcer and skin care Treatment Activities: Skin care regimen initiated : 08/08/2022 Topical wound management initiated : 08/08/2022 Notes: Electronic Signature(s) Signed: 10/21/2022 4:18:03 PM By: Caleb Stall RN, BSN Entered By: [name] on 10/21/2022 [name]:28:37 Pain Assessment Details -------------------------------------------------------------------------------- [name] ([id number]) [id number] Page 6 of 8 Patient Name: Date of Service: [facility] [name], [name] 10/21/2022 [name]:15 A M Medical Record Number: [id number] Patient Account Number: [id number] Date of Birth/Sex: Treating RN: [name]-[birth year] (37 y.o. M) Primary Care Jemeka Wagler: [name] Other Clinician: Referring Esbeydi Manago: Treating Reida Hem/Extender: [name] Weeks in Treatment: [name] Active Problems Location of Pain Severity and Description of Pain Patient Has Paino No Site  Locations Pain Management and Medication Current Pain Management: Electronic Signature(s) Signed: 10/21/2022 11:27:45 AM By: Karl Ito Entered By: Karl Ito on 10/21/2022 [name]:14:02 -------------------------------------------------------------------------------- Patient/Caregiver Education Details Patient Name: Date of Service: [facility] [name], A A RO Will Bonnet 3/29/2024andnbsp10:15 A M Medical Record Number: [id number] Patient Account Number: [id number] Date of Birth/Gender: Treating RN: [birth year] [name] (37 y.o. [name] Primary Care Physician: [name] Other Clinician: Referring Physician: Treating Physician/Extender: Caleb Michaels in Treatment: [name] Education Assessment Education Provided To: Patient Education Topics  Provided Wound/Skin Impairment: Handouts: Caring for Your Ulcer Methods: Explain/Verbal Responses: Reinforcements needed Electronic Signature(s) Signed: 10/21/2022 4:18:03 PM By: Caleb Stall RN, BSN Entered By: [name] on 10/21/2022 [name]:28:49 -------------------------------------------------------------------------------- Wound Assessment Details Patient Name: Date of Service: [name], [name] 10/21/2022 [name]:13 A Noreene Larsson, Harle Stanford ([id number]) [id number] Page 7 of 8 Medical Record Number: [id number] Patient Account Number: [id number] Date of Birth/Sex: Treating RN: [name][birth year] (37 y.o. [name] Primary Care Roman Dubuc: [name] Other Clinician: Referring Vikki Gains: Treating Desma Wilkowski/Extender: [name] Weeks in Treatment: [name] Wound Status Wound Number: 1 Primary Etiology: Open Surgical Wound Wound Location: Left Chest Wound Status: Open Wounding Event: Surgical Injury Comorbid History: Asthma, Type II Diabetes Date Acquired: 12/[name]/2023 Weeks Of Treatment: [name] Clustered Wound: Yes Photos Wound Measurements Length: (cm) Width: (cm) Depth: (cm) Clustered Quantity: Area: (cm) Volume: (cm) 0.4 % Reduction in Area: 99.3% 0.7 % Reduction in Volume: 98.5% 0.2 Epithelialization: Medium (34-66%) 1 Tunneling: No 0.22 Undermining: No 0.044 Wound Description Classification: Full Thickness Without Exposed Sup Wound Margin: Distinct, outline attached Exudate Amount: Medium Exudate Type: Serosanguineous Exudate Color: red, brown port Structures Foul Odor After Cleansing: No Slough/Fibrino No Wound Bed Granulation Amount: Large (67-100%) Exposed Structure Granulation Quality: Red, Hyper-granulation Fascia Exposed: No Necrotic Amount: None Present (0%) Fat Layer (Subcutaneous Tissue) Exposed: Yes Tendon Exposed: No Muscle Exposed: No Joint Exposed: No Bone Exposed: No Periwound Skin Texture Texture  Color No Abnormalities Noted: No No Abnormalities Noted: No Callus: No Atrophie Blanche: No Crepitus: No Cyanosis: No Excoriation: No Ecchymosis: No Induration: No Erythema: No Rash: No Hemosiderin Staining: No Scarring: No Mottled: No Pallor: No Moisture Rubor: No No Abnormalities Noted: No Dry / Scaly: No Maceration: No Electronic Signature(s) Signed: 10/21/2022 4:18:03 PM By: Caleb Stall RN, BSN Entered By: [name] on 10/21/2022 [name]:26:42 [name] ([id number]) [id number] Page 8 of 8 -------------------------------------------------------------------------------- Vitals Details Patient Name: Date of Service: [facility] [name], [name] 10/21/2022 [name]:15 A M Medical Record Number: [id number] Patient Account Number: [id number] Date of Birth/Sex: Treating RN: 08[birth year]-[name] (37 y.o. M) Primary Care Trayon Krantz: [name] Other Clinician: Referring Uilani Sanville: Treating Domonick Sittner/Extender: [name] Weeks in Treatment: [name] Vital Signs Time Taken: [name]:13 Temperature (F): 98.2 Height (in): 69 Pulse (bpm): 102 Weight (lbs): 235 Respiratory Rate (breaths/min): 18 Body Mass Index (BMI): 34.7 Blood Pressure (mmHg): 153/94 Capillary Blood Glucose (mg/dl): 623 Reference Range: 80 - 120 mg / dl Electronic Signature(s) Signed: 10/21/2022 11:27:45 AM By: Karl Ito Entered By: Karl Ito on 10/21/2022 [name]:13:54

## 2022-10-24 ENCOUNTER — Ambulatory Visit: Payer: Self-pay | Admitting: Nurse Practitioner

## 2022-10-26 ENCOUNTER — Encounter: Payer: Self-pay | Admitting: Nurse Practitioner

## 2022-10-26 ENCOUNTER — Ambulatory Visit (INDEPENDENT_AMBULATORY_CARE_PROVIDER_SITE_OTHER): Payer: No Typology Code available for payment source | Admitting: Nurse Practitioner

## 2022-10-26 VITALS — BP 122/76 | HR 92 | Temp 97.7°F | Ht 69.0 in | Wt 238.8 lb

## 2022-10-26 DIAGNOSIS — E782 Mixed hyperlipidemia: Secondary | ICD-10-CM | POA: Diagnosis not present

## 2022-10-26 DIAGNOSIS — Z1159 Encounter for screening for other viral diseases: Secondary | ICD-10-CM

## 2022-10-26 DIAGNOSIS — Z0000 Encounter for general adult medical examination without abnormal findings: Secondary | ICD-10-CM | POA: Diagnosis not present

## 2022-10-26 DIAGNOSIS — Z794 Long term (current) use of insulin: Secondary | ICD-10-CM

## 2022-10-26 DIAGNOSIS — I10 Essential (primary) hypertension: Secondary | ICD-10-CM

## 2022-10-26 DIAGNOSIS — Z1321 Encounter for screening for nutritional disorder: Secondary | ICD-10-CM | POA: Diagnosis not present

## 2022-10-26 DIAGNOSIS — L732 Hidradenitis suppurativa: Secondary | ICD-10-CM

## 2022-10-26 DIAGNOSIS — E1165 Type 2 diabetes mellitus with hyperglycemia: Secondary | ICD-10-CM

## 2022-10-26 DIAGNOSIS — E1140 Type 2 diabetes mellitus with diabetic neuropathy, unspecified: Secondary | ICD-10-CM | POA: Insufficient documentation

## 2022-10-26 NOTE — Patient Instructions (Signed)
  If you have open wounds, cover them with a clean dressing as told by your health care provider. Keep wounds clean by washing them gently with soap and water when you bathe. Do not shave the areas where you get hidradenitis suppurativa. Wear loose-fitting clothes. Try to avoid getting overheated or sweaty. If you get sweaty or wet, change into clean, dry clothes as soon as you can. To help relieve pain and itchiness, cover sore areas with a warm, clean washcloth (warm compress) for 5-10 minutes as often as needed. Your healthcare provider may recommend an antiperspirant deodorant that may be gentle on your skin. A daily antiseptic wash to cleanse affected areas may be suggested by your healthcare provider    It is important that you exercise regularly at least 30 minutes 5 times a week as tolerated  Think about what you will eat, plan ahead. Choose " clean, green, fresh or frozen" over canned, processed or packaged foods which are more sugary, salty and fatty. 70 to 75% of food eaten should be vegetables and fruit. Three meals at set times with snacks allowed between meals, but they must be fruit or vegetables. Aim to eat over a 12 hour period , example 7 am to 7 pm, and STOP after  your last meal of the day. Drink water,generally about 64 ounces per day, no other drink is as healthy. Fruit juice is best enjoyed in a healthy way, by EATING the fruit.  Thanks for choosing [facility] we consider it a privelige to serve you.

## 2022-10-26 NOTE — Assessment & Plan Note (Signed)
Annual exam as documented.  Counseling done include healthy lifestyle involving committing to 150 minutes of exercise per week, heart healthy diet, and attaining healthy weight. The importance of adequate sleep also discussed.  Regular use of seat belt and home safety were also discussed . Changes in health habits are decided on by patient with goals and time frames set for achieving them. Immunization and cancer screening  needs are specifically addressed at this visit.   Up-to-date with his vaccines

## 2022-10-26 NOTE — Assessment & Plan Note (Signed)
Patient encouraged to maintain close follow-up with dermatology Continue clindamycin 1% external solution as ordered Keep skin clean and dry always, to prevent infection and skin breakdown

## 2022-10-26 NOTE — Progress Notes (Signed)
Complete physical exam  Patient: [name]   DOB: 27-Jun-1986   37 y.o. Male  MRN: [id number]  Subjective:    Chief Complaint  Patient presents with   Follow-up    Follow up after wound apt.     [name] is a 37 y.o. 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  who presents today for a complete physical exam. He reports consuming a low sodium diet, low carb diet. Not exercising currently.  He generally feels well. He reports sleeping well. He does have additional problems to discuss today.    Patient continues with wound care , changes dressing every 3 days , has home health nurse visits once a week , and goes to the wound care center when due..patient stated that during his last visit with the wound care specialist , they found out that he had sponge in his wound , he has upcoming appointment with the wound care specialist and the surgeon to determine if he needs surgery to remove the sponge .his upcoming appointment is this week Friday .states that he is ready to go back to work once the wound care specialist releases him to go back, he stated that they would like his wound to be completely closed before releasing him to go back to work..patient denies fever, chills, shortness of breath.  He would like his FMLA extended until cleared by the wound care specialist.  He was encouraged to contact his employer and have them fax to forms to [place].    Most recent fall risk assessment:    10/26/2022    9:58 AM  Fall Risk   Falls in the past year? 0     Most recent depression screenings:    10/19/2022    2:45 PM 09/30/2022   10:54 AM  PHQ 2/9 Scores  PHQ - 2 Score 0 0  PHQ- 9 Score  2        Patient Care Team: Renee Rival, FNP as PCP - General (Nurse Practitioner)   Outpatient Medications Prior to Visit  Medication Sig   Accu-Chek FastClix  Lancets MISC Use as directed 4 times a day before meals and at bedtime   acetaminophen (TYLENOL) 325 MG tablet Take 2 tablets (650 mg total) by mouth every 8 (eight) hours as needed for moderate pain.   amLODipine (NORVASC) 5 MG tablet Take 1 tablet (5 mg total) by mouth daily.   amoxicillin (AMOXIL) 500 MG capsule Take 1 capsule (500 mg total) by mouth 3 (three) times daily.   atorvastatin (LIPITOR) 10 MG tablet TAKE 1 TABLET BY MOUTH EVERY DAY   blood glucose meter kit and supplies KIT Use as directed   clindamycin (CLEOCIN T) 1 % external solution Apply topically daily. to skin underarms   gabapentin (NEURONTIN) 100 MG capsule Take 1 capsule (100 mg total) by mouth 3 (three) times daily.   glucose blood test strip Use as directed 4 times a day before meals and at bedtime   insulin aspart (NOVOLOG) 100 UNIT/ML FlexPen Before each meal 3 times a day, CBG 140-199 - 2 units, 200-250 - 4 units, 251-299 - 6 units,  300-349 - 8 units,  350 or above-10 units. (Patient taking differently: Before each meal 3 times a day, CBG 140-199 - 2 units, 200-250 - 4 units, 251-299 - 6 units,  300-349 - 8 units,  350 or above-10 units.)  Insulin Lispro Prot & Lispro (HUMALOG MIX 75/25 KWIKPEN) (75-25) 100 UNIT/ML Kwikpen Inject 15 Units into the skin 2 (two) times daily.   Insulin Pen Needle 32G X 4 MM MISC Use as directed for 4 times a day for insulin pens   lisinopril (ZESTRIL) 5 MG tablet Take 1 tablet (5 mg total) by mouth daily.   Probiotic Product (RISA-BID PROBIOTIC) TABS Take 1 tablet by mouth 2 (two) times daily.   Blood Pressure KIT Please check you blood pressure daily. (Patient not taking: Reported on 10/26/2022)   ibuprofen (ADVIL) 200 MG tablet Take 200 mg by mouth every 6 (six) hours as needed for mild pain. (Patient not taking: Reported on 09/23/2022)   nystatin (MYCOSTATIN/NYSTOP) powder Apply 1 Application topically 3 (three) times daily. Can use as preventative powder for yeast infection (Patient not  taking: Reported on 10/26/2022)   oxyCODONE 10 MG TABS Take 0.5-1 tablets (5-10 mg total) by mouth every 6 (six) hours as needed for severe pain or moderate pain (5mg  for moderate pain, 10mg  for severe pain). (Patient not taking: Reported on 09/30/2022)   Facility-Administered Medications Prior to Visit  Medication Dose Route Frequency Provider   triamcinolone acetonide (KENALOG) 10 MG/ML injection 10 mg  10 mg Intradermal Once Talbot Grumbling, MD    Review of Systems  Constitutional: Negative.   HENT: Negative.    Eyes:  Negative for pain, discharge and redness.  Respiratory: Negative.    Cardiovascular: Negative.   Gastrointestinal: Negative.   Genitourinary: Negative.   Musculoskeletal: Negative.   Skin:  Positive for rash. Negative for itching.  Neurological: Negative.  Negative for dizziness, tremors, focal weakness, seizures, loss of consciousness, weakness and headaches.  Endo/Heme/Allergies: Negative.   Psychiatric/Behavioral: Negative.            Objective:     BP 122/76   Pulse 92   Temp 97.7 F (36.5 C)   Ht 5\' 9"  (1.753 m)   Wt 238 lb 12.8 oz (108.3 kg)   SpO2 100%   BMI 35.26 kg/m    Physical Exam Constitutional:      General: He is not in acute distress.    Appearance: Normal appearance. He is not ill-appearing or diaphoretic.  HENT:     Head: Normocephalic and atraumatic.     Right Ear: Tympanic membrane, ear canal and external ear normal. There is no impacted cerumen.     Left Ear: Tympanic membrane, ear canal and external ear normal. There is no impacted cerumen.     Nose: Nose normal. No congestion or rhinorrhea.     Mouth/Throat:     Mouth: Mucous membranes are dry.     Pharynx: Oropharynx is clear. No oropharyngeal exudate or posterior oropharyngeal erythema.  Eyes:     General: No scleral icterus.       Right eye: No discharge.        Left eye: No discharge.     Extraocular Movements: Extraocular movements intact.  Neck:     Vascular: No  carotid bruit.  Cardiovascular:     Rate and Rhythm: Normal rate and regular rhythm.     Pulses: Normal pulses.     Heart sounds: Normal heart sounds. No murmur heard.    No friction rub. No gallop.  Pulmonary:     Effort: No respiratory distress.     Breath sounds: No stridor. No wheezing, rhonchi or rales.  Chest:     Chest wall: No tenderness.  Abdominal:  General: There is no distension.     Palpations: Abdomen is soft. There is no mass.     Tenderness: There is no abdominal tenderness. There is no right CVA tenderness, left CVA tenderness, guarding or rebound.     Hernia: No hernia is present.  Musculoskeletal:        General: No swelling, tenderness, deformity or signs of injury.     Cervical back: Normal range of motion and neck supple. No rigidity or tenderness.     Right lower leg: No edema.     Left lower leg: No edema.  Lymphadenopathy:     Cervical: No cervical adenopathy.  Skin:    General: Skin is dry.     Capillary Refill: Capillary refill takes less than 2 seconds.     Coloration: Skin is not jaundiced.     Findings: No bruising, erythema or lesion.     Comments: Wound Dressing below left breast clean and dry.  An open wound noted under right breast, site is moist and pink.  No redness noted, an area on left side of the face is swollen , has clear drainage on palpation, no redness or tenderness on palpation  Neurological:     Mental Status: He is oriented to person, place, and time.     Cranial Nerves: No cranial nerve deficit.     Motor: No weakness.     Coordination: Coordination normal.     Gait: Gait normal.     Deep Tendon Reflexes: Reflexes normal.  Psychiatric:        Mood and Affect: Mood normal.        Behavior: Behavior normal.        Thought Content: Thought content normal.        Judgment: Judgment normal.      No results found for any visits on 10/26/22.     Assessment & Plan:    Routine Health Maintenance and Physical  Exam  Immunization History  Administered Date(s) Administered   Tdap 09/16/2016    Health Maintenance  Topic Date Due   OPHTHALMOLOGY EXAM  Never done   Hepatitis C Screening  Never done   COVID-19 Vaccine (1) 11/04/2022 (Originally 09/02/1986)   INFLUENZA VACCINE  02/23/2023   HEMOGLOBIN A1C  04/02/2023   Diabetic kidney evaluation - Urine ACR  07/23/2023   FOOT EXAM  07/23/2023   Diabetic kidney evaluation - eGFR measurement  09/10/2023   DTaP/Tdap/Td (2 - Td or Tdap) 09/16/2026   HIV Screening  Completed   HPV VACCINES  Aged Out    Discussed health benefits of physical activity, and encouraged him to engage in regular exercise appropriate for his age and condition.  Problem List Items Addressed This Visit       Cardiovascular and Mediastinum   Primary hypertension    BP Readings from Last 3 Encounters:  10/26/22 122/76  10/19/22 139/85  09/30/22 (!) 153/83  Currently well-controlled on amlodipine 5 mg daily, lisinopril 5 mg daily HTN Controlled . Continue current medications. No changes in management. Discussed DASH diet and dietary sodium restrictions increase dietary efforts and exercise.           Endocrine   Type 2 diabetes mellitus with hyperglycemia   Relevant Orders   Ambulatory referral to Ophthalmology   CBC   Neuropathy due to type 2 diabetes mellitus    Doing well on gabapentin 100 mg 3 times daily Continue current medication        Musculoskeletal and  Integument   Hidradenitis suppurativa    Patient encouraged to maintain close follow-up with dermatology Continue clindamycin 1% external solution as ordered Keep skin clean and dry always, to prevent infection and skin breakdown        Other   Annual physical exam - Primary    Annual exam as documented.  Counseling done include healthy lifestyle involving committing to 150 minutes of exercise per week, heart healthy diet, and attaining healthy weight. The importance of adequate sleep also  discussed.  Regular use of seat belt and home safety were also discussed . Changes in health habits are decided on by patient with goals and time frames set for achieving them. Immunization and cancer screening  needs are specifically addressed at this visit.   Up-to-date with his vaccines      Mixed hyperlipidemia    Currently on atorvastatin 10 mg daily Check fasting lipid panel      Relevant Orders   Lipid Panel   Other Visit Diagnoses     Need for hepatitis C screening test       Relevant Orders   Hepatitis C antibody   Encounter for vitamin deficiency screening       Relevant Orders   Vitamin D, 25-hydroxy      Return in about 4 weeks (around 11/23/2022).     Renee Rival, FNP

## 2022-10-26 NOTE — Assessment & Plan Note (Signed)
Doing well on gabapentin 100 mg 3 times daily Continue current medication

## 2022-10-26 NOTE — Assessment & Plan Note (Signed)
Currently on atorvastatin 10 mg daily Check fasting lipid panel 

## 2022-10-26 NOTE — Assessment & Plan Note (Signed)
BP Readings from Last 3 Encounters:  10/26/22 122/76  10/19/22 139/85  09/30/22 (!) 153/83  Currently well-controlled on amlodipine 5 mg daily, lisinopril 5 mg daily HTN Controlled . Continue current medications. No changes in management. Discussed DASH diet and dietary sodium restrictions increase dietary efforts and exercise.

## 2022-10-27 LAB — LIPID PANEL
Chol/HDL Ratio: 3 ratio (ref 0.0–5.0)
Cholesterol, Total: 173 mg/dL (ref 100–199)
HDL: 57 mg/dL (ref >39)
LDL Chol Calc (NIH): 101 mg/dL — ABNORMAL HIGH (ref 0–99)
Triglycerides: 82 mg/dL (ref 0–149)
VLDL Cholesterol Cal: 15 mg/dL (ref 5–40)

## 2022-10-27 LAB — CBC
Hematocrit: 46.7 % (ref 37.5–51.0)
Hemoglobin: 15.7 g/dL (ref 13.0–17.7)
MCH: 26.3 pg — ABNORMAL LOW (ref 26.6–33.0)
MCHC: 33.6 g/dL (ref 31.5–35.7)
MCV: 78 fL — ABNORMAL LOW (ref 79–97)
Platelets: 313 10*3/uL (ref 150–450)
RBC: 5.96 x10E6/uL — ABNORMAL HIGH (ref 4.14–5.80)
RDW: 15.3 % (ref 11.6–15.4)
WBC: 6.3 10*3/uL (ref 3.4–10.8)

## 2022-10-27 LAB — HEPATITIS C ANTIBODY: Hep C Virus Ab: NONREACTIVE

## 2022-10-27 LAB — VITAMIN D 25 HYDROXY (VIT D DEFICIENCY, FRACTURES): Vit D, 25-Hydroxy: 8.3 ng/mL — ABNORMAL LOW (ref 30.0–100.0)

## 2022-10-28 ENCOUNTER — Other Ambulatory Visit: Payer: Self-pay | Admitting: Nurse Practitioner

## 2022-10-28 DIAGNOSIS — E559 Vitamin D deficiency, unspecified: Secondary | ICD-10-CM

## 2022-10-28 MED ORDER — VITAMIN D (ERGOCALCIFEROL) 1.25 MG (50000 UNIT) PO CAPS: 50000.0000 [IU] | ORAL_CAPSULE | ORAL | 0 refills | Status: DC

## 2022-10-29 ENCOUNTER — Other Ambulatory Visit: Payer: Self-pay | Admitting: Dermatology

## 2022-10-29 DIAGNOSIS — L732 Hidradenitis suppurativa: Secondary | ICD-10-CM

## 2022-10-30 ENCOUNTER — Other Ambulatory Visit: Payer: Self-pay | Admitting: Nurse Practitioner

## 2022-11-03 ENCOUNTER — Ambulatory Visit (HOSPITAL_BASED_OUTPATIENT_CLINIC_OR_DEPARTMENT_OTHER): Payer: No Typology Code available for payment source | Admitting: Internal Medicine

## 2022-11-04 ENCOUNTER — Encounter (HOSPITAL_BASED_OUTPATIENT_CLINIC_OR_DEPARTMENT_OTHER): Payer: No Typology Code available for payment source | Attending: Internal Medicine | Admitting: Internal Medicine

## 2022-11-04 DIAGNOSIS — T8131XD Disruption of external operation (surgical) wound, not elsewhere classified, subsequent encounter: Secondary | ICD-10-CM

## 2022-11-04 DIAGNOSIS — L732 Hidradenitis suppurativa: Secondary | ICD-10-CM | POA: Insufficient documentation

## 2022-11-04 DIAGNOSIS — E11622 Type 2 diabetes mellitus with other skin ulcer: Secondary | ICD-10-CM | POA: Diagnosis present

## 2022-11-04 DIAGNOSIS — L02213 Cutaneous abscess of chest wall: Secondary | ICD-10-CM | POA: Insufficient documentation

## 2022-11-04 DIAGNOSIS — Z833 Family history of diabetes mellitus: Secondary | ICD-10-CM | POA: Diagnosis not present

## 2022-11-04 DIAGNOSIS — L98498 Non-pressure chronic ulcer of skin of other sites with other specified severity: Secondary | ICD-10-CM | POA: Diagnosis not present

## 2022-11-04 DIAGNOSIS — F17210 Nicotine dependence, cigarettes, uncomplicated: Secondary | ICD-10-CM | POA: Diagnosis not present

## 2022-11-07 ENCOUNTER — Other Ambulatory Visit: Payer: Self-pay | Admitting: Nurse Practitioner

## 2022-11-07 DIAGNOSIS — E559 Vitamin D deficiency, unspecified: Secondary | ICD-10-CM

## 2022-11-10 NOTE — Progress Notes (Signed)
[name], [name] ([id number]) [id number] Page 1 of 7 Visit Report for 11/04/2022 Arrival Information Details Patient Name: Date of Service: [facility] [name], [name] 11/04/2022 8:15 Caleb M Medical Record Number: [id number] Patient Account Number: [id number] Date of Birth/Sex: Treating RN: Jan 24, 1986 (37 y.o. [name] Primary Care [name]: [name] Other Clinician: Referring Kristoff Coonradt: Treating Lizabeth Fellner/Extender: [name] in Treatment: 12 Visit Information History Since Last Visit All ordered tests and consults were completed: Yes Patient Arrived: Ambulatory Added or deleted any medications: No Arrival Time: 08:16 Any new allergies or adverse reactions: No Accompanied By: self Had Caleb fall or experienced change in No Transfer Assistance: None activities of daily living that may affect Patient Identification Verified: Yes risk of falls: Secondary Verification Process Completed: Yes Signs or symptoms of abuse/neglect since last visito No Patient Requires Transmission-Based Precautions: No Hospitalized since last visit: No Patient Has Alerts: No Implantable device outside of the clinic excluding No cellular tissue based products placed in the center since last visit: Has Dressing in Place as Prescribed: Yes Pain Present Now: No Electronic Signature(s) Signed: 11/10/2022 1:56:54 PM By: [name] Entered By: [name] on 11/04/2022 08:17:51 -------------------------------------------------------------------------------- Encounter Discharge Information Details Patient Name: Date of Service: [facility] [name], [name] 11/04/2022 8:15 Caleb M Medical Record Number: [id number] Patient Account Number: [id number] Date of Birth/Sex: Treating RN: 1985-12-04 (37 y.o. [name] Primary Care Jerzee Jerome: [name] Other Clinician: Referring Christhoper Busbee: Treating Audriana Aldama/Extender: [name] in  Treatment: 12 Encounter Discharge Information Items Post Procedure Vitals Discharge Condition: Stable Temperature (F): 98.4 Ambulatory Status: Ambulatory Pulse (bpm): 91 Discharge Destination: Home Respiratory Rate (breaths/min): 18 Transportation: Private Auto Blood Pressure (mmHg): 140/82 Accompanied By: self Schedule Follow-up Appointment: Yes Clinical Summary of Care: Patient Declined Electronic Signature(s) Signed: 11/10/2022 1:56:54 PM By: [name] Entered By: [name] on 11/04/2022 09:24:15 -------------------------------------------------------------------------------- Lower Extremity Assessment Details Patient Name: Date of Service: [facility] [name], [name] 11/04/2022 8:15 Caleb M Medical Record Number: [id number] Patient Account Number: [id number] Date of Birth/Sex: Treating RN: [birth year] (37 y.o. [name] Primary Care Mounir Skipper: [name] Other Clinician: Referring Shawnia Vizcarrondo: Treating Korry Dalgleish/Extender: [name] in Treatment: 12 Electronic Signature(s) Signed: 11/10/2022 1:56:54 PM By: Orpah Melter, Harle Stanford ([id number]) [id number] Page 2 of 7 Signed: 11/10/2022 1:56:54 PM By: [name] Entered By: [name] on 11/04/2022 08:19:04 -------------------------------------------------------------------------------- Multi Wound Chart Details Patient Name: Date of Service: [facility] [name], [name] 11/04/2022 8:15 Caleb M Medical Record Number: [id number] Patient Account Number: [id number] Date of Birth/Sex: Treating RN: 12/29/1985 (37 y.o. M) Primary Care Yatziri Wainwright: [name] Other Clinician: Referring Shreeya Recendiz: Treating Gennette Shadix/Extender: [name] in Treatment: 12 Vital Signs Height(in): 69 Capillary Blood Glucose(mg/dl): 063 Weight(lbs): 016 Pulse(bpm): 91 Body Mass Index(BMI): 34.7 Blood Pressure(mmHg): 140/82 Temperature(F): 98.4 Respiratory  Rate(breaths/min): 18 [1:Photos:] [N/Caleb:N/Caleb] Left Chest N/Caleb N/Caleb Wound Location: Surgical Injury N/Caleb N/Caleb Wounding Event: Open Surgical Wound N/Caleb N/Caleb Primary Etiology: Asthma, Type II Diabetes N/Caleb N/Caleb Comorbid History: 07/03/2022 N/Caleb N/Caleb Date Acquired: 12 N/Caleb N/[name] of Treatment: Open N/Caleb N/Caleb Wound Status: No N/Caleb N/Caleb Wound Recurrence: Yes N/Caleb N/Caleb Clustered Wound: 1 N/Caleb N/Caleb Clustered Quantity: 0.3x0.5x0.1 N/Caleb N/Caleb Measurements L x W x D (cm) 0.118 N/Caleb N/[name] (cm) : rea 0.012 N/Caleb N/Caleb Volume (cm) : 99.60% N/Caleb N/Caleb % Reduction in Caleb rea: 99.60% N/Caleb N/Caleb % Reduction in Volume: Full Thickness  Without Exposed N/Caleb N/Caleb Classification: Support Structures Medium N/Caleb N/Caleb Exudate Caleb mount: Serosanguineous N/Caleb N/Caleb Exudate Type: red, brown N/Caleb N/Caleb Exudate Color: Distinct, outline attached N/Caleb N/Caleb Wound Margin: Large (67-100%) N/Caleb N/Caleb Granulation Caleb mount: Red, Hyper-granulation N/Caleb N/Caleb Granulation Quality: None Present (0%) N/Caleb N/Caleb Necrotic Caleb mount: Fat Layer (Subcutaneous Tissue): Yes N/Caleb N/Caleb Exposed Structures: Fascia: No Tendon: No Muscle: No Joint: No Bone: No Medium (34-66%) N/Caleb N/Caleb Epithelialization: Debridement - Excisional N/Caleb N/Caleb Debridement: Pre-procedure Verification/Time Out 09:08 N/Caleb N/Caleb Taken: Lidocaine 4% Topical Solution N/Caleb N/Caleb Pain Control: Subcutaneous N/Caleb N/Caleb Tissue Debrided: Skin/Subcutaneous Tissue N/Caleb N/Caleb Level: 0.15 N/Caleb N/Caleb Debridement Caleb (sq cm): rea Curette N/Caleb N/Caleb Instrument: Minimum N/Caleb N/Caleb Bleeding: Pressure N/Caleb N/Caleb Hemostasis Caleb chieved: Procedure was tolerated well N/Caleb N/Caleb Debridement Treatment Response: 0.3x0.5x0.1 N/Caleb N/Caleb Post Debridement Measurements L x W x D (cm) 0.012 N/Caleb N/Caleb Post Debridement Volume: (cm) Excoriation: No N/Caleb N/Caleb Periwound Skin Texture: Induration: No Callus: No BARTON, WANT ([id number]) [id number] Page 3 of 7 Crepitus: No Rash: No Scarring: No Maceration: No N/Caleb  N/Caleb Periwound Skin Moisture: Dry/Scaly: No Atrophie Blanche: No N/Caleb N/Caleb Periwound Skin Color: Cyanosis: No Ecchymosis: No Erythema: No Hemosiderin Staining: No Mottled: No Pallor: No Rubor: No Debridement N/Caleb N/Caleb Procedures Performed: Treatment Notes Wound #1 (Chest) Wound Laterality: Left Cleanser Soap and Water Discharge Instruction: May shower and wash wound with dial antibacterial soap and water prior to dressing change. Wound Cleanser Discharge Instruction: Cleanse the wound with wound cleanser prior to applying Caleb clean dressing using gauze sponges, not tissue or cotton balls. Peri-Wound Care Skin Prep Discharge Instruction: Use skin prep as directed Zinc Oxide Ointment 30g tube Discharge Instruction: Apply Zinc Oxide to periwound with each dressing change due to skin irritation Topical Primary Dressing vashe wet to dry Discharge Instruction: apply soaked vashe gauze directly to wound bed. Secondary Dressing ABD Pad, 8x10 Discharge Instruction: Apply over primary dressing as directed. Secured With Compression Wrap Compression Stockings Add-Ons Electronic Signature(s) Signed: 11/04/2022 12:30:48 PM By: Caleb Corwin DO Entered By: [name] on 11/04/2022 10:13:48 -------------------------------------------------------------------------------- Multi-Disciplinary Care Plan Details Patient Name: Date of Service: [facility] [name], [name] 11/04/2022 8:15 Caleb M Medical Record Number: [id number] Patient Account Number: [id number] Date of Birth/Sex: Treating RN: 05-Nov-1985 (37 y.o. [name] Primary Care Quavis Klutz: [name] Other Clinician: Referring Benjamyn Hestand: Treating Erdem Naas/Extender: [name] in Treatment: 12 Active Inactive Pain, Acute or Chronic Nursing Diagnoses: Pain, acute or chronic: actual or potential Potential alteration in comfort, pain Goals: [name], [name] ([id number])  [id number] Page 4 of 7 Patient will verbalize adequate pain control and receive pain control interventions during procedures as needed Date Initiated: 08/08/2022 Target Resolution Date: 11/24/2023 Goal Status: Active Patient/caregiver will verbalize comfort level met Date Initiated: 08/08/2022 Target Resolution Date: 11/24/2023 Goal Status: Active Interventions: Encourage patient to take pain medications as prescribed Provide education on pain management Reposition patient for comfort Treatment Activities: Administer pain control measures as ordered : 08/08/2022 Notes: Wound/Skin Impairment Nursing Diagnoses: Knowledge deficit related to ulceration/compromised skin integrity Goals: Patient/caregiver will verbalize understanding of skin care regimen Date Initiated: 08/08/2022 Target Resolution Date: 11/24/2023 Goal Status: Active Interventions: Assess patient/caregiver ability to perform ulcer/skin care regimen upon admission and as needed Assess ulceration(s) every visit Provide education on ulcer and skin care Treatment Activities: Skin care regimen initiated : 08/08/2022 Topical wound management initiated : 08/08/2022 Notes: Electronic Signature(s) Signed: 11/10/2022 1:56:54 PM By: [name] Entered By:  [name] on 11/04/2022 08:25:42 -------------------------------------------------------------------------------- Pain Assessment Details Patient Name: Date of Service: [name], [name] 11/04/2022 8:15 Caleb M Medical Record Number: [id number] Patient Account Number: [id number] Date of Birth/Sex: Treating RN: [birth year] (37 y.o. [name] Primary Care Lida Berkery: [name] Other Clinician: Referring Kannan Proia: Treating Krishawn Vanderweele/Extender: [name] in Treatment: 12 Active Problems Location of Pain Severity and Description of Pain Patient Has Paino No Site Locations [name], [name] ([id number])  [phone].pdf Page 5 of 7 Pain Management and Medication Current Pain Management: Electronic Signature(s) Signed: 11/10/2022 1:56:54 PM By: [name] Entered By: [name] on 11/04/2022 08:18:52 -------------------------------------------------------------------------------- Patient/Caregiver Education Details Patient Name: Date of Service: [name], [name] 4/12/2024andnbsp8:15 Caleb M Medical Record Number: [id number] Patient Account Number: [id number] Date of Birth/Gender: Treating RN: [birth year] (37 y.o. [name] Primary Care Physician: [name] Other Clinician: Referring Physician: Treating Physician/Extender: [name] in Treatment: 12 Education Assessment Education Provided To: Patient Education Topics Provided Pain: Methods: Explain/Verbal Responses: State content correctly Wound/Skin Impairment: Methods: Explain/Verbal Responses: State content correctly Electronic Signature(s) Signed: 11/10/2022 1:56:54 PM By: [name] Entered By: [name] on 11/04/2022 08:26:12 -------------------------------------------------------------------------------- Wound Assessment Details Patient Name: Date of Service: [name], [name] 11/04/2022 8:15 Caleb M Medical Record Number: [id number] Patient Account Number: [id number] Date of Birth/Sex: Treating RN: 1985/11/20 (37 y.o. [name] Primary Care Jontae Adebayo: [name] Other Clinician: Referring Davis Vannatter: Treating Brodi Nery/Extender: [name] in Treatment: 12 Wound Status Wound Number: 1 Primary Etiology: Open Surgical Wound Wound Location: Left Chest Wound Status: Open Wounding Event: Surgical Injury Comorbid History: Asthma, Type II Diabetes Date Acquired: 07/03/2022 [name] Of Treatment: 12 Clustered Wound: Yes Photos [name], [name] ([id number]) [id number] Page 6 of  7 Wound Measurements Length: (cm) Width: (cm) Depth: (cm) Clustered Quantity: Area: (cm) Volume: (cm) 0.3 % Reduction in Area: 99.6% 0.5 % Reduction in Volume: 99.6% 0.1 Epithelialization: Medium (34-66%) 1 Tunneling: No 0.118 Undermining: No 0.012 Wound Description Classification: Full Thickness Without Exposed Sup Wound Margin: Distinct, outline attached Exudate Amount: Medium Exudate Type: Serosanguineous Exudate Color: red, brown port Structures Foul Odor After Cleansing: No Slough/Fibrino No Wound Bed Granulation Amount: Large (67-100%) Exposed Structure Granulation Quality: Red, Hyper-granulation Fascia Exposed: No Necrotic Amount: None Present (0%) Fat Layer (Subcutaneous Tissue) Exposed: Yes Tendon Exposed: No Muscle Exposed: No Joint Exposed: No Bone Exposed: No Periwound Skin Texture Texture Color No Abnormalities Noted: No No Abnormalities Noted: No Callus: No Atrophie Blanche: No Crepitus: No Cyanosis: No Excoriation: No Ecchymosis: No Induration: No Erythema: No Rash: No Hemosiderin Staining: No Scarring: No Mottled: No Pallor: No Moisture Rubor: No No Abnormalities Noted: No Dry / Scaly: No Maceration: No Treatment Notes Wound #1 (Chest) Wound Laterality: Left Cleanser Soap and Water Discharge Instruction: May shower and wash wound with dial antibacterial soap and water prior to dressing change. Wound Cleanser Discharge Instruction: Cleanse the wound with wound cleanser prior to applying Caleb clean dressing using gauze sponges, not tissue or cotton balls. Peri-Wound Care Skin Prep Discharge Instruction: Use skin prep as directed Zinc Oxide Ointment 30g tube Discharge Instruction: Apply Zinc Oxide to periwound with each dressing change due to skin irritation Topical Primary Dressing vashe wet to dry Discharge Instruction: apply soaked vashe gauze directly to wound bed. [name], [name] ([id number]) [id number]  Page 7 of 7 Secondary Dressing ABD Pad, 8x10 Discharge Instruction: Apply over primary dressing as directed.  Secured With Compression Wrap Compression Stockings Facilities manager) Signed: 11/10/2022 1:56:54 PM By: [name] Entered By: [name] on 11/04/2022 08:25:01 -------------------------------------------------------------------------------- Vitals Details Patient Name: Date of Service: [name], [name] 11/04/2022 8:15 Caleb M Medical Record Number: [id number] Patient Account Number: [id number] Date of Birth/Sex: Treating RN: 13-Mar-1986 (37 y.o. [name] Primary Care Yamilee Harmes: [name] Other Clinician: Referring Majid Mccravy: Treating Derec Mozingo/Extender: [name] in Treatment: 12 Vital Signs Time Taken: 08:17 Temperature (F): 98.4 Height (in): 69 Pulse (bpm): 91 Weight (lbs): 235 Respiratory Rate (breaths/min): 18 Body Mass Index (BMI): 34.7 Blood Pressure (mmHg): 140/82 Capillary Blood Glucose (mg/dl): 696 Reference Range: 80 - 120 mg / dl Electronic Signature(s) Signed: 11/10/2022 1:56:54 PM By: [name] Entered By: [name] on 11/04/2022 08:18:43

## 2022-11-10 NOTE — Progress Notes (Signed)
[name], [name] ([id number]) [id number] Page 1 of 7 Visit Report for 11/04/2022 Chief Complaint Document Details Patient Name: Date of Service: [name] [name], [name] 11/04/2022 8:15 A M Medical Record Number: [id number] Patient Account Number: [id number] Date of Birth/Sex: Treating RN: Jun 29, 1986 (37 y.o. M) Primary Care Provider: Edwin [name] Other Clinician: Referring Provider: Treating Provider/Extender: [name] in Treatment: 12 Information Obtained from: Patient Chief Complaint 1/52/23; patient is here predominantly for a surgical wound in the left inframammary area extending into the left lateral chest. Patient also has a small area on the right inframammary area probably related to hidradenitis Electronic Signature(s) Signed: 11/04/2022 12:30:48 PM By: Caleb Corwin DO Entered By: [name] on 11/04/2022 10:13:58 -------------------------------------------------------------------------------- Debridement Details Patient Name: Date of Service: Caleb [name], [name] 11/04/2022 8:15 A M Medical Record Number: [id number] Patient Account Number: [id number] Date of Birth/Sex: Treating RN: 1985/12/10 (37 y.o. [name] Primary Care Provider: Edwin [name] Other Clinician: Referring Provider: Treating Provider/Extender: [name] in Treatment: 12 Debridement Performed for Assessment: Wound #1 Left Chest Performed By: Physician Caleb Corwin, DO Debridement Type: Debridement Level of Consciousness (Pre-procedure): Awake and Alert Pre-procedure Verification/Time Out Yes - 09:08 Taken: Start Time: 09:08 Pain Control: Lidocaine 4% T opical Solution T Area Debrided (L x W): otal 0.3 (cm) x 0.5 (cm) = 0.15 (cm) Tissue and other material debrided: Subcutaneous Level: Skin/Subcutaneous Tissue Debridement Description: Excisional Instrument: Curette Bleeding:  Minimum Hemostasis Achieved: Pressure End Time: 09:10 Response to Treatment: Procedure was tolerated well Level of Consciousness (Post- Awake and Alert procedure): Post Debridement Measurements of Total Wound Length: (cm) 0.3 Width: (cm) 0.5 Depth: (cm) 0.1 Volume: (cm) 0.012 Character of Wound/Ulcer Post Debridement: Requires Further Debridement Post Procedure Diagnosis Same as Pre-procedure Notes Scribed for [name] by Caleb Grills RN. Electronic Signature(s) Signed: 11/04/2022 12:30:48 PM By: Caleb Corwin DO Signed: 11/10/2022 1:56:54 PM By: [name] Entered By: [name] on 11/04/2022 09:10:15 [name] ([id number]) [id number] Page 2 of 7 -------------------------------------------------------------------------------- HPI Details Patient Name: Date of Service: Caleb [name], [name] 11/04/2022 8:15 A M Medical Record Number: [id number] Patient Account Number: [id number] Date of Birth/Sex: Treating RN: 1986/06/13 (37 y.o. M) Primary Care Provider: Edwin [name] Other Clinician: Referring Provider: Treating Provider/Extender: [name] in Treatment: 12 History of Present Illness HPI Description: ADMISSION 08/08/2021 This is a 37 year old man who underwent an extensive hospitalization from 06/23/2022 through 07/13/2022 with a large necrotic area on the left inframammary area extending into the left lateral chest. The patient states this came on fairly rapidly initially with a boil. This was aspirated on 12/5 and he underwent an extensive operative debridement ultimately on 12/8. Cultures showed rare Prevotella and moderate actinomyces. He initially was treated with daptomycin and Unasyn in the [name] then Augmentin and more recently he is continued on amoxicillin. He has a follow-up with Dr. Algis [name] in a few days. During the course of the hospitalization he was discovered to be a new onset  diabetic with a hemoglobin A1c of 11.6. Since his discharge from [name] he has been followed using a wound VAC on the surgical area. There is an impressive postoperative picture in epic sewing a large open surgical wound. There is been considerable improvement with the wound VAC being changed by home health. I cannot see the notes from either infectious disease or surgery Past medical history includes obesity, new onset diabetes as mentioned.  He was a smoker. 1/22; patient presents for follow-up. He has been using silver alginate 3 times weekly. He has some skin irritation to the left sided periwound. He had follow-up with infectious disease, Dr. Algis [name] on 1/17. He is currently taking amoxicillin and plan is to continue this for the next 5 months. 1/29; patient presents for follow-up. He did not obtain Vashe solution. He has been using normal saline wet-to-dry dressings. He has no issues or complaints today. We gave him sample of Vashe in office. 2/5; patient presents for follow-up. [name] to the wound beds. He has had significant improvement in wound healing to the left surgical wound site. He has not heard from dermatology for referral sent. 2/19; patient presents for follow-up. [name] to the left surgical wound site. He is scheduled to see dermatology on March 5 for an area under his right breast. 3/19; patient presents for follow-up. [name] to the left sided surgical wound site. It is all but healed to the most medial aspect. There is actually what appears to be previous wound VAC sponge coming out of it. 3/29; patient presents for follow-up. [name]. Small areas still remain open. No VAC sponge noted today. 4/12; patient presents for follow-up. Has been using Vashe wet-to-dry dressings. Small area to the medial aspect still open. VAC sponge  noted again today. Electronic Signature(s) Signed: 11/04/2022 12:30:48 PM By: Caleb Corwin DO Entered By: [name] on 11/04/2022 10:14:24 -------------------------------------------------------------------------------- Physical Exam Details Patient Name: Date of Service: Caleb [name], [name] 11/04/2022 8:15 A M Medical Record Number: [id number] Patient Account Number: [id number] Date of Birth/Sex: Treating RN: 05-Sep-1985 (37 y.o. M) Primary Care Provider: Edwin [name] Other Clinician: Referring Provider: Treating Provider/Extender: [name] in Treatment: 12 Constitutional respirations regular, non-labored and within target range for patient.Marland Kitchen Psychiatric pleasant and cooperative. Notes Narrowed wound to the left inframamillary area extending into the left lateral chest wall. T the most medial aspect there is still open wound remaining. This has o granulated tissue with incorporated black wound vac sponge. No surrounding signs of infection. Electronic Signature(s) Signed: 11/04/2022 12:30:48 PM By: Caleb Corwin DO Entered By: [name] on 11/04/2022 10:16:03 [name] ([id number]) [id number] Page 3 of 7 -------------------------------------------------------------------------------- Physician Orders Details Patient Name: Date of Service: Caleb [name], [name] 11/04/2022 8:15 A M Medical Record Number: [id number] Patient Account Number: [id number] Date of Birth/Sex: Treating RN: [birth year] (37 y.o. [name] Primary Care Provider: Edwin [name] Other Clinician: Referring Provider: Treating Provider/Extender: [name] in Treatment: 63 Verbal / Phone Orders: No Diagnosis Coding Follow-up Appointments ppointment in 2 [name]. - [name] 11/03/2022 230pm room 7 Thursday Return A Anesthetic (In clinic) Topical Lidocaine 4% applied to wound bed Bathing/  Shower/ Hygiene May shower and wash wound with soap and water. Negative Presssure Wound Therapy Discontinue wound vac. Call the number on the machine for the company to pick up. Additional Orders / Instructions Follow Nutritious Diet - Patient was given Juven samples to try Home Health No change in wound care orders this week; continue Home Health for wound care. May utilize formulary equivalent dressing for wound treatment orders unless otherwise specified. - x2 week wound care changes. wound center weekly. change to vashe wet to dry dressings to all wounds. Other Home Health Orders/Instructions: - [facility]  Wound Treatment Wound #1 - Chest Wound Laterality: Left Cleanser: Soap and Water 1 x Per Day/30 Days Discharge Instructions: May shower and wash wound with dial antibacterial soap and water prior to dressing change. Cleanser: Wound Cleanser (Home Health) 1 x Per Day/30 Days Discharge Instructions: Cleanse the wound with wound cleanser prior to applying a clean dressing using gauze sponges, not tissue or cotton balls. Peri-Wound Care: Skin Prep (Home Health) 1 x Per Day/30 Days Discharge Instructions: Use skin prep as directed Peri-Wound Care: Zinc Oxide Ointment 30g tube (Home Health) 1 x Per Day/30 Days Discharge Instructions: Apply Zinc Oxide to periwound with each dressing change due to skin irritation Prim Dressing: vashe wet to dry (Home Health) 1 x Per Day/30 Days ary Discharge Instructions: apply soaked vashe gauze directly to wound bed. Secondary Dressing: ABD Pad, 8x10 (Home Health) 1 x Per Day/30 Days Discharge Instructions: Apply over primary dressing as directed. Electronic Signature(s) Signed: 11/04/2022 12:30:48 PM By: Caleb Corwin DO Entered By: [name] on 11/04/2022 10:16:10 -------------------------------------------------------------------------------- Problem List Details Patient Name: Date of Service: Caleb [name], [name] 11/04/2022 8:15 A  M Medical Record Number: [id number] Patient Account Number: [id number] Date of Birth/Sex: Treating RN: 18-Mar-1986 (37 y.o. M) Primary Care Provider: Edwin [name] Other Clinician: Referring Provider: Treating Provider/Extender: [name] in Treatment: [address] HOLGER, SOKOLOWSKI ([id number]) [id number] Page 4 of 7 ICD-10 Encounter Code Description Active Date MDM Diagnosis T81.31XD Disruption of external operation (surgical) wound, not elsewhere classified, 08/08/2022 No Yes subsequent encounter L02.213 Cutaneous abscess of chest wall 08/08/2022 No Yes L98.498 Non-pressure chronic ulcer of skin of other sites with other specified severity 08/08/2022 No Yes L73.2 Hidradenitis suppurativa 08/08/2022 No Yes E11.622 Type 2 diabetes mellitus with other skin ulcer 08/08/2022 No Yes Inactive Problems Resolved Problems Electronic Signature(s) Signed: 11/04/2022 12:30:48 PM By: Caleb Corwin DO Entered By: [name] on 11/04/2022 10:13:44 -------------------------------------------------------------------------------- Progress Note Details Patient Name: Date of Service: Caleb [name], [name] 11/04/2022 8:15 A M Medical Record Number: [id number] Patient Account Number: [id number] Date of Birth/Sex: Treating RN: 04/07/1986 (37 y.o. M) Primary Care Provider: Edwin [name] Other Clinician: Referring Provider: Treating Provider/Extender: [name] in Treatment: 12 Subjective Chief Complaint Information obtained from Patient 1/52/23; patient is here predominantly for a surgical wound in the left inframammary area extending into the left lateral chest. Patient also has a small area on the right inframammary area probably related to hidradenitis History of Present Illness (HPI) ADMISSION 08/08/2021 This is a 37 year old man who underwent an extensive hospitalization from 06/23/2022 through  07/13/2022 with a large necrotic area on the left inframammary area extending into the left lateral chest. The patient states this came on fairly rapidly initially with a boil. This was aspirated on 12/5 and he underwent an extensive operative debridement ultimately on 12/8. Cultures showed rare Prevotella and moderate actinomyces. He initially was treated with daptomycin and Unasyn in the [name] then Augmentin and more recently he is continued on amoxicillin. He has a follow-up with Dr. Algis [name] in a few days. During the course of the hospitalization he was discovered to be a new onset diabetic with a hemoglobin A1c of 11.6. Since his discharge from the [name] he has been followed using a wound VAC on the surgical area. There is an impressive postoperative picture in epic sewing a large open surgical wound. There is been considerable improvement with the wound VAC being changed by home health. I  cannot see the notes from either infectious disease or surgery Past medical history includes obesity, new onset diabetes as mentioned. He was a smoker. 1/22; patient presents for follow-up. He has been using silver alginate 3 times weekly. He has some skin irritation to the left sided periwound. He had follow-up with infectious disease, Dr. Algis [name] on 1/17. He is currently taking amoxicillin and plan is to continue this for the next 5 months. 1/29; patient presents for follow-up. He did not obtain Vashe solution. He has been using normal saline wet-to-dry dressings. He has no issues or complaints today. We gave him sample of Vashe in office. 2/5; patient presents for follow-up. [name] to the wound beds. He has had significant improvement in wound healing to the left surgical wound site. He has not heard from dermatology for referral sent. 2/19; patient presents for follow-up. [name] to the left surgical wound site. He is scheduled to  see dermatology on March 5 for an area under his right breast. 3/19; patient presents for follow-up. [name] to the left sided surgical wound site. It is all but healed to the most medial [name], [name] ([id number]) [id number] Page 5 of 7 aspect. There is actually what appears to be previous wound VAC sponge coming out of it. 3/29; patient presents for follow-up. [name]. Small areas still remain open. No VAC sponge noted today. 4/12; patient presents for follow-up. Has been using Vashe wet-to-dry dressings. Small area to the medial aspect still open. VAC sponge noted again today. Patient History Family History Cancer - Maternal Grandparents,Paternal Grandparents, Diabetes - Mother. Social History Current every day smoker - 1 ppd, Alcohol Use - Moderate. Medical History Respiratory Patient has history of Asthma Endocrine Patient has history of Type II Diabetes Hospitalization/Surgery History - 12/8 and12/10 IandD left breast abscess. Medical A Surgical History Notes nd Constitutional Symptoms (General Health) breast abscess Psychiatric anxiety Objective Constitutional respirations regular, non-labored and within target range for patient.. Vitals Time Taken: 8:17 AM, Height: 69 in, Weight: 235 lbs, BMI: 34.7, Temperature: 98.4 F, Pulse: 91 bpm, Respiratory Rate: 18 breaths/min, Blood Pressure: 140/82 mmHg, Capillary Blood Glucose: 112 mg/dl. Psychiatric pleasant and cooperative. General Notes: Narrowed wound to the left inframamillary area extending into the left lateral chest wall. T the most medial aspect there is still open wound o remaining. This has granulated tissue with incorporated black wound vac sponge. No surrounding signs of infection. Integumentary (Hair, Skin) Wound #1 status is Open. Original cause of wound was Surgical Injury. The date acquired was: 07/03/2022. The  wound has been in treatment 12 [name]. The wound is located on the Left Chest. The wound measures 0.3cm length x 0.5cm width x 0.1cm depth; 0.118cm^2 area and 0.012cm^3 volume. There is Fat Layer (Subcutaneous Tissue) exposed. There is no tunneling or undermining noted. There is a medium amount of serosanguineous drainage noted. The wound margin is distinct with the outline attached to the wound base. There is large (67-100%) red, hyper - granulation within the wound bed. There is no necrotic tissue within the wound bed. The periwound skin appearance did not exhibit: Callus, Crepitus, Excoriation, Induration, Rash, Scarring, Dry/Scaly, Maceration, Atrophie Blanche, Cyanosis, Ecchymosis, Hemosiderin Staining, Mottled, Pallor, Rubor, Erythema. Assessment Active Problems ICD-10 Disruption of external operation (surgical) wound, not elsewhere classified, subsequent encounter Cutaneous abscess of chest wall Non-pressure chronic ulcer of skin of other sites with other  specified severity Hidradenitis suppurativa Type 2 diabetes mellitus with other skin ulcer Patient's wound is stable. T the medial aspect there is still an opening and now again there is black wound VAC sponge incorporated trying to work its way o out. Discussed case with Dr. Lady Gary who assisted with sharp debridement. Will continue with Vashe wet-to-dry dressings and weekly follow-up to try and remove the wound VAC sponge as needed. No signs of infection. Hopefully we can get the tissue to grow over this after we remove the sponge. Procedures Wound #1 BRYANT, SAYE ([id number]) [id number] Page 6 of 7 Pre-procedure diagnosis of Wound #1 is an Open Surgical Wound located on the Left Chest . There was a Excisional Skin/Subcutaneous Tissue Debridement with a total area of 0.15 sq cm performed by Caleb Corwin, DO. With the following instrument(s): Curette Material removed includes Subcutaneous Tissue after  achieving pain control using Lidocaine 4% Topical Solution. No specimens were taken. A time out was conducted at 09:08, prior to the start of the procedure. A Minimum amount of bleeding was controlled with Pressure. The procedure was tolerated well. Post Debridement Measurements: 0.3cm length x 0.5cm width x 0.1cm depth; 0.012cm^3 volume. Character of Wound/Ulcer Post Debridement requires further debridement. Post procedure Diagnosis Wound #1: Same as Pre-Procedure General Notes: Scribed for [name] by Caleb Grills RN.Marland Kitchen Plan Follow-up Appointments: Return Appointment in 2 [name]. - [name] 11/03/2022 230pm room 7 Thursday Anesthetic: (In clinic) Topical Lidocaine 4% applied to wound bed Bathing/ Shower/ Hygiene: May shower and wash wound with soap and water. Negative Presssure Wound Therapy: Discontinue wound vac. Call the number on the machine for the company to pick up. Additional Orders / Instructions: Follow Nutritious Diet - Patient was given Juven samples to try Home Health: No change in wound care orders this week; continue Home Health for wound care. May utilize formulary equivalent dressing for wound treatment orders unless otherwise specified. - x2 week wound care changes. wound center weekly. change to vashe wet to dry dressings to all wounds. Other Home Health Orders/Instructions: - [facility] WOUND #1: - Chest Wound Laterality: Left Cleanser: Soap and Water 1 x Per Day/30 Days Discharge Instructions: May shower and wash wound with dial antibacterial soap and water prior to dressing change. Cleanser: Wound Cleanser (Home Health) 1 x Per Day/30 Days Discharge Instructions: Cleanse the wound with wound cleanser prior to applying a clean dressing using gauze sponges, not tissue or cotton balls. Peri-Wound Care: Skin Prep (Home Health) 1 x Per Day/30 Days Discharge Instructions: Use skin prep as directed Peri-Wound Care: Zinc Oxide Ointment 30g tube (Home Health) 1  x Per Day/30 Days Discharge Instructions: Apply Zinc Oxide to periwound with each dressing change due to skin irritation Prim Dressing: vashe wet to dry (Home Health) 1 x Per Day/30 Days ary Discharge Instructions: apply soaked vashe gauze directly to wound bed. Secondary Dressing: ABD Pad, 8x10 (Home Health) 1 x Per Day/30 Days Discharge Instructions: Apply over primary dressing as directed. 1. In office sharp debridement 2. Vashe wet-to-dry dressings 3. Follow-up in 1 week Electronic Signature(s) Signed: 11/04/2022 12:30:48 PM By: Caleb Corwin DO Entered By: [name] on 11/04/2022 10:17:26 -------------------------------------------------------------------------------- HxROS Details Patient Name: Date of Service: Caleb [name], [name] 11/04/2022 8:15 A M Medical Record Number: [id number] Patient Account Number: [id number] Date of Birth/Sex: Treating RN: May 16, 1986 (37 y.o. M) Primary Care Provider: Edwin [name] Other Clinician: Referring Provider: Treating Provider/Extender: [name] in Treatment: 12 Constitutional  Symptoms (General Health) Medical History: Past Medical History Notes: breast abscess Respiratory Medical History: Positive for: Asthma Endocrine Medical History: Positive for: Type II Diabetes Treated with: Insulin TAREN, TOOPS ([id number]) [id number] Page 7 of 7 Psychiatric Medical History: Past Medical History Notes: anxiety Immunizations Implantable Devices No devices added Hospitalization / Surgery History Type of Hospitalization/Surgery 12/8 and12/10 IandD left breast abscess Family and Social History Cancer: Yes - Maternal Grandparents,Paternal Grandparents; Diabetes: Yes - Mother; Current every day smoker - 1 ppd; Alcohol Use: Moderate Electronic Signature(s) Signed: 11/04/2022 12:30:48 PM By: Caleb Corwin DO Entered By: [name] on 11/04/2022  10:14:39 -------------------------------------------------------------------------------- SuperBill Details Patient Name: Date of Service: Caleb [name], [name] 11/04/2022 Medical Record Number: [id number] Patient Account Number: [id number] Date of Birth/Sex: Treating RN: May 19, 1986 (37 y.o. [name] Primary Care Provider: Edwin [name] Other Clinician: Referring Provider: Treating Provider/Extender: [name] in Treatment: 12 Diagnosis Coding ICD-10 Codes Code Description T81.31XD Disruption of external operation (surgical) wound, not elsewhere classified, subsequent encounter L02.213 Cutaneous abscess of chest wall L98.498 Non-pressure chronic ulcer of skin of other sites with other specified severity L73.2 Hidradenitis suppurativa E11.622 Type 2 diabetes mellitus with other skin ulcer Facility Procedures : 3 CPT4 Code: [id number] Description: 11042 - DEB SUBQ TISSUE 20 SQ CM/< ICD-10 Diagnosis Description T81.31XD Disruption of external operation (surgical) wound, not elsewhere classified, subs L98.498 Non-pressure chronic ulcer of skin of other sites with other specified  severity E11.622 Type 2 diabetes mellitus with other skin ulcer Modifier: equent encounter Quantity: 1 Physician Procedures : CPT4 Code Description Modifier [id number] 11042 - WC PHYS SUBQ TISS 20 SQ CM ICD-10 Diagnosis Description T81.31XD Disruption of external operation (surgical) wound, not elsewhere classified, subsequent encounter L98.498 Non-pressure chronic ulcer of skin  of other sites with other specified severity E11.622 Type 2 diabetes mellitus with other skin ulcer Quantity: 1 Electronic Signature(s) Signed: 11/04/2022 12:30:48 PM By: Caleb Corwin DO Entered By: [name] on 11/04/2022 10:19:19

## 2022-11-11 ENCOUNTER — Encounter (HOSPITAL_BASED_OUTPATIENT_CLINIC_OR_DEPARTMENT_OTHER): Payer: No Typology Code available for payment source | Admitting: Internal Medicine

## 2022-11-11 DIAGNOSIS — L98498 Non-pressure chronic ulcer of skin of other sites with other specified severity: Secondary | ICD-10-CM

## 2022-11-11 DIAGNOSIS — T8131XD Disruption of external operation (surgical) wound, not elsewhere classified, subsequent encounter: Secondary | ICD-10-CM

## 2022-11-11 DIAGNOSIS — E11622 Type 2 diabetes mellitus with other skin ulcer: Secondary | ICD-10-CM

## 2022-11-11 DIAGNOSIS — L02213 Cutaneous abscess of chest wall: Secondary | ICD-10-CM | POA: Diagnosis not present

## 2022-11-18 ENCOUNTER — Other Ambulatory Visit: Payer: Self-pay | Admitting: Nurse Practitioner

## 2022-11-18 DIAGNOSIS — E782 Mixed hyperlipidemia: Secondary | ICD-10-CM

## 2022-11-23 ENCOUNTER — Ambulatory Visit (INDEPENDENT_AMBULATORY_CARE_PROVIDER_SITE_OTHER): Payer: No Typology Code available for payment source | Admitting: Nurse Practitioner

## 2022-11-23 ENCOUNTER — Encounter: Payer: Self-pay | Admitting: Nurse Practitioner

## 2022-11-23 VITALS — BP 135/80 | HR 105 | Temp 97.7°F | Ht 69.0 in | Wt 246.8 lb

## 2022-11-23 DIAGNOSIS — L732 Hidradenitis suppurativa: Secondary | ICD-10-CM

## 2022-11-23 DIAGNOSIS — L03313 Cellulitis of chest wall: Secondary | ICD-10-CM

## 2022-11-23 DIAGNOSIS — I10 Essential (primary) hypertension: Secondary | ICD-10-CM

## 2022-11-23 DIAGNOSIS — Z0271 Encounter for disability determination: Secondary | ICD-10-CM

## 2022-11-23 MED ORDER — ACCU-CHEK SOFTCLIX LANCETS MISC: 12 refills | Status: DC

## 2022-11-23 MED ORDER — AMLODIPINE BESYLATE 5 MG PO TABS: 5.0000 mg | ORAL_TABLET | Freq: Every day | ORAL | 1 refills | Status: DC

## 2022-11-23 NOTE — Assessment & Plan Note (Signed)
Wound completely closed, no redness ,drainage or swelling noted.  Ready to go back to work in about 2 weeks

## 2022-11-23 NOTE — Assessment & Plan Note (Signed)
BP Readings from Last 3 Encounters:  11/23/22 135/80  10/26/22 122/76  10/19/22 139/85   Currently well-controlled on amlodipine 5 mg daily, lisinopril 5 mg daily HTN Controlled . Continue current medications. No changes in management. Discussed DASH diet and dietary sodium restrictions increase dietary efforts and exercise

## 2022-11-23 NOTE — Patient Instructions (Addendum)
If you have open wounds, cover them with a clean dressing as told by your health care provider. Keep wounds clean by washing them gently with soap and water when you bathe. Do not shave the areas where you get hidradenitis suppurativa. Wear loose-fitting clothes. Try to avoid getting overheated or sweaty. If you get sweaty or wet, change into clean, dry clothes as soon as you can. To help relieve pain and itchiness, cover sore areas with a warm, clean washcloth (warm compress) for 5-10 minutes as often as needed. Your healthcare provider may recommend an antiperspirant deodorant that may be gentle on your skin. A daily antiseptic wash to cleanse affected areas may be suggested by your healthcare provider. General instructions Learn as much as you can about your disease so that you have an active role in your treatment. Work closely with your health care provider to find treatments that work for you. If you are overweight, work with your health care provider to lose weight as recommended. Do not use any products that contain nicotine or tobacco. These products include cigarettes, chewing tobacco, and vaping devices, such as e-cigarettes. If you need help quitting, ask your health care provider. If you struggle with living with this condition, talk with your health care provider or work with a mental health care provider as recommended. Keep all follow-up visits.

## 2022-11-23 NOTE — Assessment & Plan Note (Signed)
Patient encouraged to maintain close follow-up with dermatology Continue clindamycin 1% external solution as ordered Keep skin clean and dry always, to prevent infection and skin breakdown Smoking cessation encouraged. Educational materiel on HS given

## 2022-11-23 NOTE — Assessment & Plan Note (Signed)
Planning to go back to work in 2 weeks once cleared by the wound care specialist.  Will complete paperwork and send to patients employee as planned.

## 2022-11-23 NOTE — Progress Notes (Addendum)
Established Patient Office Visit  Subjective:  Patient ID: [name], male    DOB: 08-Jun-1986  Age: 37 y.o. MRN: [id number]  CC:  Chief Complaint  Patient presents with   Follow-up    HPI [name] is a 37 y.o. 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 Patient present to discuss extending his FMLA for additional 2 weeks to allow proper healing of his wound.   Had sponges pulled out 2 weeks ago, upcoming appointment with the wound care specialist on Friday this week, was told that he cn go back to work once his wound stops draining for at least 1- 2 weeks, no more drainage noted since the past 2 days , site is open to air, clean and dry. He denies fever, chills malaise.  Has a swollen area on his right armpit that hurts when he lays on his right side. Still taking amoxicillin 500mg  TID.   He did not follow the dosage instruction for vitamin D 5000 units once weekly so he has finished taken the 8 capsules ordered.   Has upcoming appointment with dermatology 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   Allergy    Asthma    Breast abscess 06/28/2022   Chest wall abscess 08/10/2022   Intertrigo 08/10/2022    Past Surgical History:  Procedure Laterality Date   INCISION AND DRAINAGE ABSCESS Left 07/01/2022   Procedure: INCISION AND DRAINAGE ABSCESS;  Surgeon: Griselda Miner, MD;  Location: [facility] OR;  Service: General;  Laterality: Left;   IRRIGATION AND DEBRIDEMENT ABSCESS Left 07/03/2022   Procedure: IRRIGATION AND DEBRIDEMENT CHEST WALL / Breast ABSCESS;  Surgeon: Violeta Gelinas, MD;  Location: [facility] OR;  Service: General;  Laterality: Left;    Family History  Problem Relation Age of Onset   Diabetes Mother    Breast cancer Maternal Grandmother    Diabetes Paternal Grandmother    Diabetes Paternal Grandfather     Colon cancer Neg Hx     Social History   Socioeconomic History   Marital status: Single    Spouse name: Not on file   Number of children: 1   Years of education: Not on file   Highest education level: Not on file  Occupational History   Not on file  Tobacco Use   Smoking status: Every Day    Packs/day: .3    Types: Cigarettes    Passive exposure: Never   Smokeless tobacco: Never  Substance and Sexual Activity   Alcohol use: Yes    Alcohol/week: 13.0 standard drinks of alcohol    Types: 9 Cans of beer, 4 Shots of liquor per week    Comment: occasional   Drug use: Not Currently    Types: Marijuana   Sexual activity: Yes  Other Topics Concern   Not on file  Social History Narrative   Lives with his mother.    Social Determinants of Health   Financial Resource Strain: Not on file  Food Insecurity: No Food Insecurity (06/28/2022)   Hunger Vital Sign    Worried About Running Out of Food in the Last Year: Never true    Ran Out of Food in the Last Year: Never true  Transportation Needs: No Transportation Needs (06/28/2022)   PRAPARE - Administrator, Civil Service (Medical): No    Lack of Transportation (Non-Medical): No  Physical Activity:  Not on file  Stress: Not on file  Social Connections: Not on file  Intimate Partner Violence: Not At Risk (06/28/2022)   Humiliation, Afraid, Rape, and Kick questionnaire    Fear of Current or Ex-Partner: No    Emotionally Abused: No    Physically Abused: No    Sexually Abused: No    Outpatient Medications Prior to Visit  Medication Sig Dispense Refill   Accu-Chek FastClix Lancets MISC Use as directed 4 times a day before meals and at bedtime 102 each 0   amoxicillin (AMOXIL) 500 MG capsule Take 1 capsule (500 mg total) by mouth 3 (three) times daily. 90 capsule 4   atorvastatin (LIPITOR) 10 MG tablet TAKE 1 TABLET BY MOUTH EVERY DAY 90 tablet 1   blood glucose meter kit and supplies KIT Use as directed 1 each 0    clindamycin (CLEOCIN T) 1 % external solution APPLY TOPICALLY DAILY. TO SKIN UNDERARMS 30 mL 0   gabapentin (NEURONTIN) 100 MG capsule Take 1 capsule (100 mg total) by mouth 3 (three) times daily. 90 capsule 3   glucose blood (ACCU-CHEK GUIDE) test strip USE AS DIRECTED 4 TIMES A DAY BEFORE MEALS AND AT BEDTIME 100 strip 2   insulin aspart (NOVOLOG) 100 UNIT/ML FlexPen Before each meal 3 times a day, CBG 140-199 - 2 units, 200-250 - 4 units, 251-299 - 6 units,  300-349 - 8 units,  350 or above-10 units. (Patient taking differently: Before each meal 3 times a day, CBG 140-199 - 2 units, 200-250 - 4 units, 251-299 - 6 units,  300-349 - 8 units,  350 or above-10 units.) 15 mL 0   Insulin Lispro Prot & Lispro (HUMALOG MIX 75/25 KWIKPEN) (75-25) 100 UNIT/ML Kwikpen Inject 15 Units into the skin 2 (two) times daily. 15 mL 11   Insulin Pen Needle 32G X 4 MM MISC Use as directed for 4 times a day for insulin pens 100 each 0   lisinopril (ZESTRIL) 5 MG tablet Take 1 tablet (5 mg total) by mouth daily. 90 tablet 3   Vitamin D, Ergocalciferol, (DRISDOL) 1.25 MG (50000 UNIT) CAPS capsule Take 1 capsule (50,000 Units total) by mouth every 7 (seven) days. (Patient not taking: Reported on 11/23/2022) 8 capsule 0   amLODipine (NORVASC) 5 MG tablet Take 1 tablet (5 mg total) by mouth daily. 90 tablet 0   acetaminophen (TYLENOL) 325 MG tablet Take 2 tablets (650 mg total) by mouth every 8 (eight) hours as needed for moderate pain. (Patient not taking: Reported on 11/23/2022) 20 tablet 0   Blood Pressure KIT Please check you blood pressure daily. (Patient not taking: Reported on 10/26/2022) 1 kit 0   ibuprofen (ADVIL) 200 MG tablet Take 200 mg by mouth every 6 (six) hours as needed for mild pain. (Patient not taking: Reported on 09/23/2022)     nystatin (MYCOSTATIN/NYSTOP) powder Apply 1 Application topically 3 (three) times daily. Can use as preventative powder for yeast infection (Patient not taking: Reported on 10/26/2022) 30 g 5    oxyCODONE 10 MG TABS Take 0.5-1 tablets (5-10 mg total) by mouth every 6 (six) hours as needed for severe pain or moderate pain (5mg  for moderate pain, 10mg  for severe pain). (Patient not taking: Reported on 09/30/2022) 25 tablet 0   Probiotic Product (RISA-BID PROBIOTIC) TABS Take 1 tablet by mouth 2 (two) times daily. (Patient not taking: Reported on 11/23/2022) 60 tablet 2   Facility-Administered Medications Prior to Visit  Medication Dose Route Frequency Provider  Last Rate Last Admin   triamcinolone acetonide (KENALOG) 10 MG/ML injection 10 mg  10 mg Intradermal Once Terri Piedra, MD        Allergies  Allergen Reactions   Bee Pollen    Peach [Prunus Persica]     Pt reports swelling and lip tingling as reaction    ROS Review of Systems  Constitutional:  Negative for activity change, appetite change, chills, diaphoresis, fatigue, fever and unexpected weight change.  HENT:  Negative for congestion, dental problem, drooling and ear discharge.   Eyes:  Negative for pain, discharge, redness and itching.  Respiratory:  Negative for apnea, cough, choking, chest tightness, shortness of breath and wheezing.   Cardiovascular: Negative.  Negative for chest pain, palpitations and leg swelling.  Gastrointestinal:  Negative for abdominal distention, abdominal pain, anal bleeding, blood in stool, constipation, diarrhea and vomiting.  Endocrine: Negative for polydipsia, polyphagia and polyuria.  Genitourinary:  Negative for difficulty urinating, flank pain, frequency and genital sores.  Musculoskeletal: Negative.  Negative for arthralgias, back pain, gait problem and joint swelling.  Skin:  Negative for color change and pallor.  Neurological:  Negative for dizziness, facial asymmetry, light-headedness, numbness and headaches.  Psychiatric/Behavioral:  Negative for agitation, behavioral problems, confusion, hallucinations, self-injury, sleep disturbance and suicidal ideas.       Objective:     Physical Exam Vitals and nursing note reviewed.  Constitutional:      General: He is not in acute distress.    Appearance: Normal appearance. He is obese. He is not ill-appearing, toxic-appearing or diaphoretic.  HENT:     Mouth/Throat:     Mouth: Mucous membranes are moist.     Pharynx: Oropharynx is clear. No oropharyngeal exudate or posterior oropharyngeal erythema.  Eyes:     General: No scleral icterus.       Right eye: No discharge.        Left eye: No discharge.     Extraocular Movements: Extraocular movements intact.     Conjunctiva/sclera: Conjunctivae normal.  Cardiovascular:     Rate and Rhythm: Normal rate and regular rhythm.     Pulses: Normal pulses.     Heart sounds: Normal heart sounds. No murmur heard.    No friction rub. No gallop.  Pulmonary:     Effort: Pulmonary effort is normal. No respiratory distress.     Breath sounds: Normal breath sounds. No stridor. No wheezing, rhonchi or rales.  Chest:     Chest wall: No tenderness.  Abdominal:     General: There is no distension.     Palpations: Abdomen is soft.     Tenderness: There is no abdominal tenderness. There is no right CVA tenderness, left CVA tenderness or guarding.  Musculoskeletal:        General: No swelling, tenderness, deformity or signs of injury.     Right lower leg: No edema.     Left lower leg: No edema.  Skin:    General: Skin is warm and dry.     Capillary Refill: Capillary refill takes 2 to 3 seconds.     Coloration: Skin is not jaundiced or pale.     Findings: Lesion present. No bruising or erythema.     Comments: Non draining lesions noted on bilateral axillary area. Right axillary lesion firm on palpation, slightly warm but not red. Incision site under left breast appears completely healed.   Neurological:     Mental Status: He is alert and oriented to person, place, and  time.     Motor: No weakness.     Coordination: Coordination normal.     Gait: Gait normal.  Psychiatric:         Mood and Affect: Mood normal.        Behavior: Behavior normal.        Thought Content: Thought content normal.        Judgment: Judgment normal.     BP 135/80   Pulse (!) 105   Temp 97.7 F (36.5 C)   Ht 5\' 9"  (1.753 m)   Wt 246 lb 12.8 oz (111.9 kg)   SpO2 99%   BMI 36.45 kg/m  Wt Readings from Last 3 Encounters:  11/23/22 246 lb 12.8 oz (111.9 kg)  10/26/22 238 lb 12.8 oz (108.3 kg)  10/19/22 237 lb (107.5 kg)    Lab Results  Component Value Date   TSH 3.788 06/24/2022   Lab Results  Component Value Date   WBC 6.3 10/26/2022   HGB 15.7 10/26/2022   HCT 46.7 10/26/2022   MCV 78 (L) 10/26/2022   PLT 313 10/26/2022   Lab Results  Component Value Date   NA 139 09/09/2022   K 4.2 09/09/2022   CO2 22 09/09/2022   GLUCOSE 138 (H) 09/09/2022   BUN 8 09/09/2022   CREATININE 0.74 (L) 09/09/2022   BILITOT 0.4 08/10/2022   ALKPHOS 130 (H) 07/22/2022   AST 14 08/10/2022   ALT 18 08/10/2022   PROT 7.7 08/10/2022   ALBUMIN 3.6 (L) 07/22/2022   CALCIUM 9.7 09/09/2022   ANIONGAP 7 07/11/2022   EGFR 119 09/09/2022   Lab Results  Component Value Date   CHOL 173 10/26/2022   Lab Results  Component Value Date   HDL 57 10/26/2022   Lab Results  Component Value Date   LDLCALC 101 (H) 10/26/2022   Lab Results  Component Value Date   TRIG 82 10/26/2022   Lab Results  Component Value Date   CHOLHDL 3.0 10/26/2022   Lab Results  Component Value Date   HGBA1C 6.9 (A) 09/30/2022      Assessment & Plan:   Problem List Items Addressed This Visit       Cardiovascular and Mediastinum   Primary hypertension    BP Readings from Last 3 Encounters:  11/23/22 135/80  10/26/22 122/76  10/19/22 139/85   Currently well-controlled on amlodipine 5 mg daily, lisinopril 5 mg daily HTN Controlled . Continue current medications. No changes in management. Discussed DASH diet and dietary sodium restrictions increase dietary efforts and exercise      Relevant  Medications   amLODipine (NORVASC) 5 MG tablet     Musculoskeletal and Integument   Hidradenitis suppurativa - Primary    Patient encouraged to maintain close follow-up with dermatology Continue clindamycin 1% external solution as ordered Keep skin clean and dry always, to prevent infection and skin breakdown Smoking cessation encouraged. Educational materiel on HS given         Other   Cellulitis of chest wall    Wound completely closed, no redness ,drainage or swelling noted.  Ready to go back to work in about 2 weeks      Disability examination    Planning to go back to work in 2 weeks once cleared by the wound care specialist.  Will complete paperwork and send to patients employee as planned.        Meds ordered this encounter  Medications   amLODipine (NORVASC) 5 MG tablet  Sig: Take 1 tablet (5 mg total) by mouth daily.    Dispense:  90 tablet    Refill:  1   Accu-Chek Softclix Lancets lancets    Sig: Use as instructed    Dispense:  100 each    Refill:  12    Follow-up: Return in about 4 months (around 03/26/2023) for VITAMIN D, HTN, DM.    Donell Beers, FNP

## 2022-11-24 ENCOUNTER — Other Ambulatory Visit: Payer: Self-pay | Admitting: Nurse Practitioner

## 2022-11-25 ENCOUNTER — Encounter (HOSPITAL_BASED_OUTPATIENT_CLINIC_OR_DEPARTMENT_OTHER): Payer: No Typology Code available for payment source | Attending: Internal Medicine | Admitting: Internal Medicine

## 2022-11-25 DIAGNOSIS — L732 Hidradenitis suppurativa: Secondary | ICD-10-CM | POA: Diagnosis not present

## 2022-11-25 DIAGNOSIS — T8131XA Disruption of external operation (surgical) wound, not elsewhere classified, initial encounter: Secondary | ICD-10-CM | POA: Diagnosis present

## 2022-11-25 DIAGNOSIS — Z6834 Body mass index (BMI) 34.0-34.9, adult: Secondary | ICD-10-CM | POA: Diagnosis not present

## 2022-11-25 DIAGNOSIS — L98498 Non-pressure chronic ulcer of skin of other sites with other specified severity: Secondary | ICD-10-CM | POA: Diagnosis not present

## 2022-11-25 DIAGNOSIS — E11622 Type 2 diabetes mellitus with other skin ulcer: Secondary | ICD-10-CM | POA: Diagnosis not present

## 2022-11-25 DIAGNOSIS — E669 Obesity, unspecified: Secondary | ICD-10-CM | POA: Insufficient documentation

## 2022-11-25 DIAGNOSIS — T8131XD Disruption of external operation (surgical) wound, not elsewhere classified, subsequent encounter: Secondary | ICD-10-CM

## 2022-11-25 DIAGNOSIS — Y838 Other surgical procedures as the cause of abnormal reaction of the patient, or of later complication, without mention of misadventure at the time of the procedure: Secondary | ICD-10-CM | POA: Diagnosis not present

## 2022-11-25 DIAGNOSIS — L02213 Cutaneous abscess of chest wall: Secondary | ICD-10-CM | POA: Diagnosis not present

## 2022-11-25 DIAGNOSIS — F17210 Nicotine dependence, cigarettes, uncomplicated: Secondary | ICD-10-CM | POA: Insufficient documentation

## 2022-11-30 ENCOUNTER — Other Ambulatory Visit: Payer: Self-pay | Admitting: Dermatology

## 2022-11-30 DIAGNOSIS — L732 Hidradenitis suppurativa: Secondary | ICD-10-CM

## 2022-12-28 ENCOUNTER — Ambulatory Visit: Payer: No Typology Code available for payment source | Admitting: Dermatology

## 2022-12-29 ENCOUNTER — Ambulatory Visit: Payer: No Typology Code available for payment source | Admitting: Infectious Disease

## 2023-01-15 NOTE — Progress Notes (Unsigned)
Subjective:  Chief complaint: follow-up for extesive chest wall infection which was polymicrobial but also involved actinomyces    Patient ID: [name], male    DOB: [birth year], 37 y.o.   MRN: [id number]  HPI  37 y.o. male with morbid obesity newly diagnosed noticed diabetes mellitus likely retinitis suppurativa with recurrent infections in his soft tissues in particular axilla groin now admitted  with massive soft tissue chest wall abscess sp multiple debridments.    Prevotella and actinomyces been isolated.  He was on antibiotics but later simplified to Unasyn and then Augmentin.  He was discharged with Augmentin as well as a supply of amoxicillin which she is to switch to once he completes Augmentin.  About to finish Augmentin today and switch over to amoxicillin tomorrow.  He has developed what sounds like a yeast infection in his groin.  He was  following with [facility] now going to see wound care.  As I last saw him he is continue to be followed with wound care and has required further debridements of nonviable tissue.  Most recently a fragment of his wound vacuum was recovered from his wound by surgery.  He has been also seen by dermatology who gave him some injections of corticosteroids into his axilla bilaterally.  This did cause his sugars to go quite high.   He had continued on his amoxicillin at last visit with me though it is not clear to me based on fill hx in epic that he has filled since April.  When I talked to him they said he still had plenty of antibiotics leftover and has not stopped them yet.  He has now finished more than 7 months of postoperative treatment with beta-lactam therapy  He has no evidence of residual infection in his chest wall.  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   Allergy    Asthma    Breast abscess 06/28/2022    Chest wall abscess 08/10/2022   Intertrigo 08/10/2022    Past Surgical History:  Procedure Laterality Date   INCISION AND DRAINAGE ABSCESS Left 07/01/2022   Procedure: INCISION AND DRAINAGE ABSCESS;  Surgeon: Griselda Miner, MD;  Location: [facility] OR;  Service: General;  Laterality: Left;   IRRIGATION AND DEBRIDEMENT ABSCESS Left 07/03/2022   Procedure: IRRIGATION AND DEBRIDEMENT CHEST WALL / Breast ABSCESS;  Surgeon: Violeta Gelinas, MD;  Location: [facility] OR;  Service: General;  Laterality: Left;    Family History  Problem Relation Age of Onset   Diabetes Mother    Breast cancer Maternal Grandmother    Diabetes Paternal Grandmother    Diabetes Paternal Grandfather    Colon cancer Neg Hx       Social History   Socioeconomic History   Marital status: Single    Spouse name: Not on file   Number of children: 1   Years of education: Not on file   Highest education level: Not on file  Occupational History   Not on file  Tobacco Use   Smoking status: Every Day    Packs/day: .3    Types: Cigarettes    Passive exposure: Never   Smokeless tobacco: Never  Substance and Sexual Activity   Alcohol use: Yes    Alcohol/week: 13.0 standard drinks of alcohol    Types: 9 Cans of beer, 4 Shots of liquor per week    Comment: occasional   Drug  use: Not Currently    Types: Marijuana   Sexual activity: Yes  Other Topics Concern   Not on file  Social History Narrative   Lives with his mother.    Social Determinants of Health   Financial Resource Strain: Not on file  Food Insecurity: No Food Insecurity (06/28/2022)   Hunger Vital Sign    Worried About Running Out of Food in the Last Year: Never true    Ran Out of Food in the Last Year: Never true  Transportation Needs: No Transportation Needs (06/28/2022)   PRAPARE - Administrator, Civil Service (Medical): No    Lack of Transportation (Non-Medical): No  Physical Activity: Not on file  Stress: Not on file  Social Connections:  Not on file    Allergies  Allergen Reactions   Bee Pollen    Peach [Prunus Persica]     Pt reports swelling and lip tingling as reaction     Current Outpatient Medications:    Vitamin D, Ergocalciferol, (DRISDOL) 1.25 MG (50000 UNIT) CAPS capsule, Take 1 capsule (50,000 Units total) by mouth every 7 (seven) days. (Patient not taking: Reported on 11/23/2022), Disp: 8 capsule, Rfl: 0   Accu-Chek FastClix Lancets MISC, Use as directed 4 times a day before meals and at bedtime, Disp: 102 each, Rfl: 0   Accu-Chek Softclix Lancets lancets, Use as instructed, Disp: 100 each, Rfl: 12   acetaminophen (TYLENOL) 325 MG tablet, Take 2 tablets (650 mg total) by mouth every 8 (eight) hours as needed for moderate pain. (Patient not taking: Reported on 11/23/2022), Disp: 20 tablet, Rfl: 0   amLODipine (NORVASC) 5 MG tablet, Take 1 tablet (5 mg total) by mouth daily., Disp: 90 tablet, Rfl: 1   amoxicillin (AMOXIL) 500 MG capsule, Take 1 capsule (500 mg total) by mouth 3 (three) times daily., Disp: 90 capsule, Rfl: 4   atorvastatin (LIPITOR) 10 MG tablet, TAKE 1 TABLET BY MOUTH EVERY DAY, Disp: 90 tablet, Rfl: 1   BD PEN NEEDLE NANO 2ND GEN 32G X 4 MM MISC, USE AS DIRECTED FOR 4 TIMES A DAY FOR INSULIN PENS, Disp: 100 each, Rfl: 0   blood glucose meter kit and supplies KIT, Use as directed, Disp: 1 each, Rfl: 0   Blood Pressure KIT, Please check you blood pressure daily. (Patient not taking: Reported on 10/26/2022), Disp: 1 kit, Rfl: 0   clindamycin (CLEOCIN T) 1 % external solution, Apply topically daily. to skin underarms, Disp: 60 mL, Rfl: 2   gabapentin (NEURONTIN) 100 MG capsule, Take 1 capsule (100 mg total) by mouth 3 (three) times daily., Disp: 90 capsule, Rfl: 3   glucose blood (ACCU-CHEK GUIDE) test strip, USE AS DIRECTED 4 TIMES A DAY BEFORE MEALS AND AT BEDTIME, Disp: 100 strip, Rfl: 2   ibuprofen (ADVIL) 200 MG tablet, Take 200 mg by mouth every 6 (six) hours as needed for mild pain. (Patient not  taking: Reported on 09/23/2022), Disp: , Rfl:    insulin aspart (NOVOLOG) 100 UNIT/ML FlexPen, Before each meal 3 times a day, CBG 140-199 - 2 units, 200-250 - 4 units, 251-299 - 6 units,  300-349 - 8 units,  350 or above-10 units. (Patient taking differently: Before each meal 3 times a day, CBG 140-199 - 2 units, 200-250 - 4 units, 251-299 - 6 units,  300-349 - 8 units,  350 or above-10 units.), Disp: 15 mL, Rfl: 0   Insulin Lispro Prot & Lispro (HUMALOG MIX 75/25 KWIKPEN) (75-25) 100 UNIT/ML  Kwikpen, Inject 15 Units into the skin 2 (two) times daily., Disp: 15 mL, Rfl: 11   lisinopril (ZESTRIL) 5 MG tablet, Take 1 tablet (5 mg total) by mouth daily., Disp: 90 tablet, Rfl: 3   nystatin (MYCOSTATIN/NYSTOP) powder, Apply 1 Application topically 3 (three) times daily. Can use as preventative powder for yeast infection (Patient not taking: Reported on 10/26/2022), Disp: 30 g, Rfl: 5   oxyCODONE 10 MG TABS, Take 0.5-1 tablets (5-10 mg total) by mouth every 6 (six) hours as needed for severe pain or moderate pain (5mg  for moderate pain, 10mg  for severe pain). (Patient not taking: Reported on 09/30/2022), Disp: 25 tablet, Rfl: 0   Probiotic Product (RISA-BID PROBIOTIC) TABS, Take 1 tablet by mouth 2 (two) times daily. (Patient not taking: Reported on 11/23/2022), Disp: 60 tablet, Rfl: 2  Current Facility-Administered Medications:    triamcinolone acetonide (KENALOG) 10 MG/ML injection 10 mg, 10 mg, Intradermal, Once, Terri Piedra, DO   Review of Systems  Constitutional:  Negative for chills and fever.  HENT:  Negative for congestion and sore throat.   Eyes:  Negative for photophobia.  Respiratory:  Negative for cough, shortness of breath and wheezing.   Cardiovascular:  Negative for chest pain, palpitations and leg swelling.  Gastrointestinal:  Negative for abdominal pain, blood in stool, constipation, diarrhea, nausea and vomiting.  Genitourinary:  Negative for dysuria, flank pain and hematuria.   Musculoskeletal:  Negative for back pain and myalgias.  Skin:  Negative for rash.  Neurological:  Negative for dizziness, weakness and headaches.  Hematological:  Does not bruise/bleed easily.  Psychiatric/Behavioral:  Negative for suicidal ideas.        Objective:   Physical Exam Constitutional:      Appearance: He is well-developed.  HENT:     Head: Normocephalic and atraumatic.  Eyes:     Conjunctiva/sclera: Conjunctivae normal.  Cardiovascular:     Rate and Rhythm: Normal rate and regular rhythm.  Pulmonary:     Effort: Pulmonary effort is normal. No respiratory distress.     Breath sounds: No wheezing.  Abdominal:     General: There is no distension.     Palpations: Abdomen is soft.  Musculoskeletal:        General: No tenderness. Normal range of motion.     Cervical back: Normal range of motion and neck supple.  Skin:    General: Skin is warm and dry.     Coloration: Skin is not pale.     Findings: No erythema or rash.  Neurological:     General: No focal deficit present.     Mental Status: He is alert and oriented to person, place, and time.  Psychiatric:        Mood and Affect: Mood normal.        Behavior: Behavior normal.        Thought Content: Thought content normal.        Judgment: Judgment normal.   Chest wall wound 10/19/2022:     Chest wall 01/16/23:    Axilla area of HS       Assessment & Plan:  Extensive chest wall infection that was polymicrobial but also contained actinomyces: Has completed 7 months of postoperative antibiotics  I will recheck sed rate CRP BMP with GFR CBC with differential.  Will plan on him stopping antibiotics and he can return to clinic as needed   Hidradenitis suppurativa: Following with dermatology and is applying Topical agents to the axillary areas  DM: he says glycemic control is improved  I spent26 minutes with the patient including than 50% of the time in face to face counseling of the patient  personally reviewing along with review of medical records in preparation for the visit and during the visit and in coordination of her care.

## 2023-01-16 ENCOUNTER — Ambulatory Visit (INDEPENDENT_AMBULATORY_CARE_PROVIDER_SITE_OTHER): Payer: No Typology Code available for payment source | Admitting: Dermatology

## 2023-01-16 ENCOUNTER — Ambulatory Visit (INDEPENDENT_AMBULATORY_CARE_PROVIDER_SITE_OTHER): Payer: No Typology Code available for payment source | Admitting: Infectious Disease

## 2023-01-16 ENCOUNTER — Encounter: Payer: Self-pay | Admitting: Dermatology

## 2023-01-16 ENCOUNTER — Other Ambulatory Visit: Payer: Self-pay

## 2023-01-16 ENCOUNTER — Encounter: Payer: Self-pay | Admitting: Infectious Disease

## 2023-01-16 VITALS — BP 139/86 | HR 98 | Temp 97.8°F | Ht 69.0 in | Wt 255.0 lb

## 2023-01-16 DIAGNOSIS — L304 Erythema intertrigo: Secondary | ICD-10-CM

## 2023-01-16 DIAGNOSIS — A429 Actinomycosis, unspecified: Secondary | ICD-10-CM

## 2023-01-16 DIAGNOSIS — L732 Hidradenitis suppurativa: Secondary | ICD-10-CM

## 2023-01-16 DIAGNOSIS — L03313 Cellulitis of chest wall: Secondary | ICD-10-CM | POA: Diagnosis not present

## 2023-01-16 DIAGNOSIS — N611 Abscess of the breast and nipple: Secondary | ICD-10-CM | POA: Diagnosis not present

## 2023-01-16 DIAGNOSIS — F1010 Alcohol abuse, uncomplicated: Secondary | ICD-10-CM

## 2023-01-16 DIAGNOSIS — L02213 Cutaneous abscess of chest wall: Secondary | ICD-10-CM

## 2023-01-16 DIAGNOSIS — K047 Periapical abscess without sinus: Secondary | ICD-10-CM

## 2023-01-16 LAB — CBC WITH DIFFERENTIAL/PLATELET
Absolute Monocytes: 468 cells/uL (ref 200–950)
Basophils Absolute: 47 cells/uL (ref 0–200)
Eosinophils Relative: 2.8 %
HCT: 42.2 % (ref 38.5–50.0)
Hemoglobin: 14.6 g/dL (ref 13.2–17.1)
MCH: 27.9 pg (ref 27.0–33.0)
Monocytes Relative: 6 %
Neutro Abs: 4274 cells/uL (ref 1500–7800)
Platelets: 347 10*3/uL (ref 140–400)
RBC: 5.23 10*6/uL (ref 4.20–5.80)
RDW: 15.9 % — ABNORMAL HIGH (ref 11.0–15.0)
Total Lymphocyte: 35.8 %

## 2023-01-16 MED ORDER — TRIAMCINOLONE ACETONIDE 10 MG/ML IJ SUSP
5.0000 mg | Freq: Once | INTRAMUSCULAR | Status: AC
Start: 2023-01-16 — End: 2023-01-16
  Administered 2023-01-16: 5 mg

## 2023-01-16 NOTE — Patient Instructions (Signed)
Due to recent changes in healthcare laws, you may see results of your pathology and/or laboratory studies on MyChart before the doctors have had a chance to review them. We understand that in some cases there may be results that are confusing or concerning to you. Please understand that not all results are received at the same time and often the doctors may need to interpret multiple results in order to provide you with the best plan of care or course of treatment. Therefore, we ask that you please give us 2 business days to thoroughly review all your results before contacting the office for clarification. Should we see a critical lab result, you will be contacted sooner.   If You Need Anything After Your Visit  If you have any questions or concerns for your doctor, please call our main line at [phone] If no one answers, please leave a voicemail as directed and we will return your call as soon as possible. Messages left after 4 pm will be answered the following business day.   You may also send us a message via MyChart. We typically respond to MyChart messages within 1-2 business days.  For prescription refills, please ask your pharmacy to contact our office. Our fax number is [phone].  If you have an urgent issue when the clinic is closed that cannot wait until the next business day, you can page your doctor at the number below.    Please note that while we do our best to be available for urgent issues outside of office hours, we are not available 24/7.   If you have an urgent issue and are unable to reach us, you may choose to seek medical care at your doctor's office, retail clinic, urgent care center, or emergency room.  If you have a medical emergency, please immediately call 911 or go to the emergency department. In the event of inclement weather, please call our main line at [phone] for an update on the status of any delays or closures.  Dermatology Medication Tips: Please  keep the boxes that topical medications come in in order to help keep track of the instructions about where and how to use these. Pharmacies typically print the medication instructions only on the boxes and not directly on the medication tubes.   If your medication is too expensive, please contact our office at [phone] or send us a message through MyChart.   We are unable to tell what your co-pay for medications will be in advance as this is different depending on your insurance coverage. However, we may be able to find a substitute medication at lower cost or fill out paperwork to get insurance to cover a needed medication.   If a prior authorization is required to get your medication covered by your insurance company, please allow us 1-2 business days to complete this process.  Drug prices often vary depending on where the prescription is filled and some pharmacies may offer cheaper prices.  The website www.goodrx.com contains coupons for medications through different pharmacies. The prices here do not account for what the cost may be with help from insurance (it may be cheaper with your insurance), but the website can give you the price if you did not use any insurance.  - You can print the associated coupon and take it with your prescription to the pharmacy.  - You may also stop by our office during regular business hours and pick up a GoodRx coupon card.  - If you need your   prescription sent electronically to a different pharmacy, notify our office through [facility] MyChart or by phone at [phone]     

## 2023-01-16 NOTE — Progress Notes (Unsigned)
   Follow-Up Visit   Subjective  [name] is a 37 y.o. male who presents for the following: HIDRADENITIS SUPPURATIVA  Patient present today for follow up visit for HIDRADENITIS SUPPURATIVA. Patient was last evaluated on 09/27/22. Patient reports he has had 1 flares since his previous visit. At the first sign of the flare he used the Clindamycin Lotion and the flare resolved. He states he has doxycyline on hand but he hasn't had to use it. Patient reports his sxs are better. Patient denies medication changes.   The following portions of the chart were reviewed this encounter and updated as appropriate: medications, allergies, medical history  Review of Systems:  No other skin or systemic complaints except as noted in HPI or Assessment and Plan.  Objective  Well appearing patient in no apparent distress; mood and affect are within normal limits.  A focused examination was performed of the following areas: Axillae  Relevant exam findings are noted in the Assessment and Plan.  Left Axilla, Right Axilla Hidradenitis Suppurativa    Assessment & Plan   HIDRADENITIS SUPPURATIVA  Well controlled  Hidradenitis Suppurativa is a chronic; persistent; non-curable, but treatable condition due to abnormal inflamed sweat glands in the body folds (axilla, inframammary, groin, medial thighs), causing recurrent painful draining cysts and scarring. It can be associated with severe scarring acne and cysts; also abscesses and scarring of scalp. The goal is control and prevention of flares, as it is not curable. Scars are permanent and can be thickened. Treatment may include daily use of topical medication and oral antibiotics.  Oral isotretinoin may also be helpful.  For some cases, Humira or Cosentyx (biologic injections) may be prescribed to decrease the inflammatory process and prevent flares.  When indicated, inflamed cysts may also be treated surgically.  Treatment Plan: - Continue washing with  Dial Anti-bacterial - Recommended trying Cerave 4% Benzoyl Peroxide foaming wash to prevent flares  Hidradenitis suppurativa Left Axilla; Right Axilla  Symptomatic, irritating, patient would like treated.  Benign-appearing.  Call clinic for new or changing lesions.   Prior to procedure, discussed risks of blister formation, small wound, skin dyspigmentation, or rare scar following treatment. Recommend Vaseline ointment to treated areas while healing.  Procedure Note Intralesional Injection  Location: B/L Axillae  Informed Consent: Discussed risks (infection, pain, bleeding, bruising, thinning of the skin, loss of skin pigment, lack of resolution, and recurrence of lesion) and benefits of the procedure, as well as the alternatives. Informed consent was obtained. Preparation: The area was prepared a standard fashion.  Anesthesia: N/A  Procedure Details: An intralesional injection was performed with Kenalog 5 mg/cc. .15 cc in total were injected. NDC #: 4098-1191-47 Exp: 07/24/2024  Total number of injections: 3  Plan: The patient was instructed on post-op care. Recommend OTC analgesia as needed for pain.   Related Medications triamcinolone acetonide (KENALOG) 10 MG/ML injection 10 mg   clindamycin (CLEOCIN T) 1 % external solution Apply topically daily. to skin underarms  triamcinolone acetonide (KENALOG) 10 MG/ML injection 5 mg    Return in about 4 months (around 05/18/2023) for HIDRADENITIS SUPPURATIVA F/U.  Documentation: I have reviewed the above documentation for accuracy and completeness, and I agree with the above.  Stasia Cavalier, am acting as scribe for Langston Reusing, DO.  Langston Reusing, DO

## 2023-01-17 LAB — COMPLETE METABOLIC PANEL WITH GFR
AG Ratio: 1.2 (calc) (ref 1.0–2.5)
ALT: 31 U/L (ref 9–46)
AST: 20 U/L (ref 10–40)
Albumin: 4.1 g/dL (ref 3.6–5.1)
Alkaline phosphatase (APISO): 74 U/L (ref 36–130)
BUN: 13 mg/dL (ref 7–25)
CO2: 21 mmol/L (ref 20–32)
Calcium: 9.7 mg/dL (ref 8.6–10.3)
Chloride: 105 mmol/L (ref 98–110)
Creat: 0.8 mg/dL (ref 0.60–1.26)
Globulin: 3.4 g/dL (calc) (ref 1.9–3.7)
Glucose, Bld: 140 mg/dL — ABNORMAL HIGH (ref 65–99)
Potassium: 4.7 mmol/L (ref 3.5–5.3)
Sodium: 137 mmol/L (ref 135–146)
Total Bilirubin: 0.8 mg/dL (ref 0.2–1.2)
Total Protein: 7.5 g/dL (ref 6.1–8.1)
eGFR: 116 mL/min/{1.73_m2} (ref >=60)

## 2023-01-17 LAB — CBC WITH DIFFERENTIAL/PLATELET
Basophils Relative: 0.6 %
Eosinophils Absolute: 218 cells/uL (ref 15–500)
Lymphs Abs: 2792 cells/uL (ref 850–3900)
MCHC: 34.6 g/dL (ref 32.0–36.0)
MCV: 80.7 fL (ref 80.0–100.0)
MPV: 9.5 fL (ref 7.5–12.5)
Neutrophils Relative %: 54.8 %
WBC: 7.8 10*3/uL (ref 3.8–10.8)

## 2023-01-17 LAB — SEDIMENTATION RATE: Sed Rate: 29 mm/h — ABNORMAL HIGH (ref 0–15)

## 2023-01-17 LAB — C-REACTIVE PROTEIN: CRP: <3 mg/L (ref <8.0)

## 2023-01-23 NOTE — Progress Notes (Signed)
[name] [name] ([id number]) [id number] Page 1 of 6 Visit Report for 11/25/2022 Chief Complaint Document Details Patient Name: Date of Service: [name] [name] [name] [name] [name] RO N L. 11/25/2022 9:30 [name] M Medical Record Number: [id number] Patient Account Number: [id number] Date of Birth/Sex: Treating RN: Nov 30, 1985 (37 y.o. M) Primary Care Provider: Edwin Dada Other Clinician: Referring Provider: Treating Provider/Extender: Selinda Michaels in Treatment: 15 Information Obtained from: Patient Chief Complaint 1/52/23; patient is here predominantly for [name] surgical wound in the left inframammary area extending into the left lateral chest. Patient also has [name] small area on the right inframammary area probably related to hidradenitis Electronic Signature(s) Signed: 11/25/2022 10:08:01 AM By: Geralyn Corwin DO Entered By: Geralyn Corwin on 11/25/2022 09:45:53 -------------------------------------------------------------------------------- HPI Details Patient Name: Date of Service: [name] [name] [name] RO N L. 11/25/2022 9:30 [name] M Medical Record Number: [id number] Patient Account Number: [id number] Date of Birth/Sex: Treating RN: Nov 25, 1985 (37 y.o. M) Primary Care Provider: Edwin Dada Other Clinician: Referring Provider: Treating Provider/Extender: Jhonnie Garner Weeks in Treatment: 15 History of Present Illness HPI Description: ADMISSION 08/08/2021 This is [name] 37 year old man who underwent an extensive hospitalization from 06/23/2022 through 07/13/2022 with [name] large necrotic area on the left inframammary area extending into the left lateral chest. The patient states this came on fairly rapidly initially with [name] boil. This was aspirated on 12/5 and he underwent an extensive operative debridement ultimately on 12/8. Cultures showed rare Prevotella and moderate actinomyces. He initially was treated with daptomycin and Unasyn in the [name] then  Augmentin and more recently he is continued on amoxicillin. He has [name] follow-up with Dr. Algis Liming in [name] few days. During the course of the hospitalization he was discovered to be [name] new onset diabetic with [name] hemoglobin A1c of 11.6. Since his discharge from the [name] he has been followed using [name] wound VAC on the surgical area. There is an impressive postoperative picture in epic sewing [name] large open surgical wound. There is been considerable improvement with the wound VAC being changed by home health. I cannot see the notes from either infectious disease or surgery Past medical history includes obesity, new onset diabetes as mentioned. He was [name] smoker. 1/22; patient presents for follow-up. He has been using silver alginate 3 times weekly. He has some skin irritation to the left sided periwound. He had follow-up with infectious disease, Dr. Algis Liming on 1/17. He is currently taking amoxicillin and plan is to continue this for the next 5 months. 1/29; patient presents for follow-up. He did not obtain Vashe solution. He has been using normal saline wet-to-dry dressings. He has no issues or complaints today. We gave him sample of Vashe in office. 2/5; patient presents for follow-up. [name] to the wound beds. He has had significant improvement in wound healing to the left surgical wound site. He has not heard from dermatology for referral sent. 2/19; patient presents for follow-up. [name] to the left surgical wound site. He is scheduled to see dermatology on March 5 for an area under his right breast. 3/19; patient presents for follow-up. [name] to the left sided surgical wound site. It is all but healed to the most medial aspect. There is actually what appears to be previous wound VAC sponge coming out of it. 3/29; patient presents for follow-up. [name]. Small  areas still remain open. No VAC  sponge noted today. [name] [name], [name] [name] ([id number]) [id number] Page 2 of 6 4/12; patient presents for follow-up. Has been using Vashe wet-to-dry dressings. Small area to the medial aspect still open. VAC sponge noted again today. 4/19; patient presents for follow-up. [name]. There is [name] small area still open however appears well-healing and no sponge noted. 5/3; patient presents for follow-up. His wound is closed. Electronic Signature(s) Signed: 11/25/2022 10:08:01 AM By: Geralyn Corwin DO Entered By: Geralyn Corwin on 11/25/2022 09:46:23 -------------------------------------------------------------------------------- Physical Exam Details Patient Name: Date of Service: [name] [name] [name] RO N L. 11/25/2022 9:30 [name] M Medical Record Number: [id number] Patient Account Number: [id number] Date of Birth/Sex: Treating RN: Jan 16, 1986 (37 y.o. M) Primary Care Provider: Edwin Dada Other Clinician: Referring Provider: Treating Provider/Extender: Jhonnie Garner Weeks in Treatment: 15 Constitutional respirations regular, non-labored and within target range for patient.. Cardiovascular 2+ dorsalis pedis/posterior tibialis pulses. Psychiatric pleasant and cooperative. Notes Epithelization to the left inframamillary previous wound site. Electronic Signature(s) Signed: 11/25/2022 10:08:01 AM By: Geralyn Corwin DO Entered By: Geralyn Corwin on 11/25/2022 09:47:02 -------------------------------------------------------------------------------- Physician Orders Details Patient Name: Date of Service: [name] [name] [name] RO N L. 11/25/2022 9:30 [name] M Medical Record Number: [id number] Patient Account Number: [id number] Date of Birth/Sex: Treating RN: Dec 11, 1985 (37 y.o. Charlean Merl, Lauren Primary Care Provider: Edwin Dada Other Clinician: Referring Provider: Treating Provider/Extender: Selinda Michaels in Treatment: 15 Verbal / Phone Orders: No Diagnosis Coding Discharge From [facility] Discharge from Wound Care Center Electronic Signature(s) Signed: 11/25/2022 10:08:01 AM By: Geralyn Corwin DO Entered By: Geralyn Corwin on 11/25/2022 09:47:08 [name] [name] ([id number]) [id number] Page 3 of 6 -------------------------------------------------------------------------------- Problem List Details Patient Name: Date of Service: [name] [name] [name] RO N L. 11/25/2022 9:30 [name] M Medical Record Number: [id number] Patient Account Number: [id number] Date of Birth/Sex: Treating RN: [birth year] (37 y.o. M) Primary Care Provider: Edwin Dada Other Clinician: Referring Provider: Treating Provider/Extender: Jhonnie Garner Weeks in Treatment: 15 Active Problems ICD-10 Encounter Code Description Active Date MDM Diagnosis T81.31XD Disruption of external operation (surgical) wound, not elsewhere classified, 08/08/2022 No Yes subsequent encounter L02.213 Cutaneous abscess of chest wall 08/08/2022 No Yes L98.498 Non-pressure chronic ulcer of skin of other sites with other specified severity 08/08/2022 No Yes L73.2 Hidradenitis suppurativa 08/08/2022 No Yes E11.622 Type 2 diabetes mellitus with other skin ulcer 08/08/2022 No Yes Inactive Problems Resolved Problems Electronic Signature(s) Signed: 11/25/2022 10:08:01 AM By: Geralyn Corwin DO Entered By: Geralyn Corwin on 11/25/2022 09:45:25 -------------------------------------------------------------------------------- Progress Note Details Patient Name: Date of Service: [name] [name] [name] RO N L. 11/25/2022 9:30 [name] M Medical Record Number: [id number] Patient Account Number: [id number] Date of Birth/Sex: Treating RN: 01-16-1986 (37 y.o. M) Primary Care Provider: Edwin Dada Other Clinician: Referring Provider: Treating Provider/Extender: Selinda Michaels in Treatment: 15 Subjective Chief Complaint Information obtained from Patient 1/52/23; patient is here predominantly for [name] surgical wound in the left inframammary area extending into the left lateral chest. Patient also has [name] small area on the right inframammary area probably related to hidradenitis [name] [name], [name] [name] ([id number]) [id number] Page 4 of 6 History of Present Illness (HPI) ADMISSION 08/08/2021 This is [name] 37 year old man who underwent an extensive hospitalization from 06/23/2022 through 07/13/2022 with [name] large necrotic area on the left inframammary area extending into the left lateral chest. The patient states this came on fairly rapidly initially with [name] boil. This was aspirated on 12/5 and he  underwent an extensive operative debridement ultimately on 12/8. Cultures showed rare Prevotella and moderate actinomyces. He initially was treated with daptomycin and Unasyn in the [name] then Augmentin and more recently he is continued on amoxicillin. He has [name] follow-up with Dr. Algis Liming in [name] few days. During the course of the hospitalization he was discovered to be [name] new onset diabetic with [name] hemoglobin A1c of 11.6. Since his discharge from the [name] he has been followed using [name] wound VAC on the surgical area. There is an impressive postoperative picture in epic sewing [name] large open surgical wound. There is been considerable improvement with the wound VAC being changed by home health. I cannot see the notes from either infectious disease or surgery Past medical history includes obesity, new onset diabetes as mentioned. He was [name] smoker. 1/22; patient presents for follow-up. He has been using silver alginate 3 times weekly. He has some skin irritation to the left sided periwound. He had follow-up with infectious disease, Dr. Algis Liming on 1/17. He is currently taking amoxicillin and plan is to continue this for the next 5 months. 1/29; patient presents for  follow-up. He did not obtain Vashe solution. He has been using normal saline wet-to-dry dressings. He has no issues or complaints today. We gave him sample of Vashe in office. 2/5; patient presents for follow-up. [name] to the wound beds. He has had significant improvement in wound healing to the left surgical wound site. He has not heard from dermatology for referral sent. 2/19; patient presents for follow-up. [name] to the left surgical wound site. He is scheduled to see dermatology on March 5 for an area under his right breast. 3/19; patient presents for follow-up. [name] to the left sided surgical wound site. It is all but healed to the most medial aspect. There is actually what appears to be previous wound VAC sponge coming out of it. 3/29; patient presents for follow-up. [name]. Small areas still remain open. No VAC sponge noted today. 4/12; patient presents for follow-up. Has been using Vashe wet-to-dry dressings. Small area to the medial aspect still open. VAC sponge noted again today. 4/19; patient presents for follow-up. [name]. There is [name] small area still open however appears well-healing and no sponge noted. 5/3; patient presents for follow-up. His wound is closed. Patient History Family History Cancer - Maternal Grandparents,Paternal Grandparents, Diabetes - Mother. Social History Current every day smoker - 1 ppd, Alcohol Use - Moderate. Medical History Respiratory Patient has history of Asthma Endocrine Patient has history of Type II Diabetes Hospitalization/Surgery History - 12/8 and12/10 IandD left breast abscess. Medical [name] Surgical History Notes nd Constitutional Symptoms (General Health) breast abscess Psychiatric anxiety Objective Constitutional respirations regular, non-labored  and within target range for patient.. Vitals Time Taken: 9:26 AM, Height: 69 in, Weight: 235 lbs, BMI: 34.7, Temperature: 98.6 F, Pulse: 88 bpm, Respiratory Rate: 17 breaths/min, Blood Pressure: 144/88 mmHg, Capillary Blood Glucose: 112 mg/dl. Cardiovascular 2+ dorsalis pedis/posterior tibialis pulses. Psychiatric pleasant and cooperative. General Notes: Epithelization to the left inframamillary previous wound site. Integumentary (Hair, Skin) Wound #1 status is Open. Original cause of wound was Surgical Injury. The date acquired was: 07/03/2022. The wound has been in treatment 15 weeks. The wound is located on the Left Chest. The wound measures 0cm length x 0cm width x 0cm depth; 0cm^2 area and  0cm^3 volume. There is Fat Layer (Subcutaneous Tissue) exposed. There is [name] medium amount of serosanguineous drainage noted. The wound margin is distinct with the outline attached to the [name] [name], [name] [name] ([id number]) [id number] Page 5 of 6 wound base. There is large (67-100%) red granulation within the wound bed. There is no necrotic tissue within the wound bed. The periwound skin appearance did not exhibit: Callus, Crepitus, Excoriation, Induration, Rash, Scarring, Dry/Scaly, Maceration, Atrophie Blanche, Cyanosis, Ecchymosis, Hemosiderin Staining, Mottled, Pallor, Rubor, Erythema. Assessment Active Problems ICD-10 Disruption of external operation (surgical) wound, not elsewhere classified, subsequent encounter Cutaneous abscess of chest wall Non-pressure chronic ulcer of skin of other sites with other specified severity Hidradenitis suppurativa Type 2 diabetes mellitus with other skin ulcer Patient has done well with Vashe wet-to-dry dressings. The wound has healed very nicely. No induration noted. No erythema. Plan Discharge From [name] Services: Discharge from Wound Care Center 1. Discharge from clinic due to closed wound 2. Follow-up as needed Electronic  Signature(s) Signed: 01/23/2023 2:18:20 PM By: Pearletha Alfred Signed: 01/23/2023 2:57:08 PM By: Geralyn Corwin DO Previous Signature: 11/25/2022 10:08:01 AM Version By: Geralyn Corwin DO Entered By: Pearletha Alfred on 01/23/2023 14:18:20 -------------------------------------------------------------------------------- HxROS Details Patient Name: Date of Service: [name] [name] [name] RO N L. 11/25/2022 9:30 [name] M Medical Record Number: [id number] Patient Account Number: [id number] Date of Birth/Sex: Treating RN: 1986/05/15 (37 y.o. M) Primary Care Provider: Edwin Dada Other Clinician: Referring Provider: Treating Provider/Extender: Jhonnie Garner Weeks in Treatment: 15 Constitutional Symptoms (General Health) Medical History: Past Medical History Notes: breast abscess Respiratory Medical History: Positive for: Asthma Endocrine Medical History: Positive for: Type II Diabetes Treated with: Insulin Psychiatric [name] [name], [name] [name] ([id number]) [id number] Page 6 of 6 Medical History: Past Medical History Notes: anxiety Immunizations Implantable Devices No devices added Hospitalization / Surgery History Type of Hospitalization/Surgery 12/8 and12/10 IandD left breast abscess Family and Social History Cancer: Yes - Maternal Grandparents,Paternal Grandparents; Diabetes: Yes - Mother; Current every day smoker - 1 ppd; Alcohol Use: Moderate Electronic Signature(s) Signed: 11/25/2022 10:08:01 AM By: Geralyn Corwin DO Entered By: Geralyn Corwin on 11/25/2022 09:46:29 -------------------------------------------------------------------------------- SuperBill Details Patient Name: Date of Service: [name] [name] [name] RO N L. 11/25/2022 Medical Record Number: [id number] Patient Account Number: [id number] Date of Birth/Sex: Treating RN: 06/29/1986 (37 y.o. M) Primary Care Provider: Edwin Dada Other Clinician: Referring Provider: Treating Provider/Extender:  Jhonnie Garner Weeks in Treatment: 15 Diagnosis Coding ICD-10 Codes Code Description T81.31XD Disruption of external operation (surgical) wound, not elsewhere classified, subsequent encounter L02.213 Cutaneous abscess of chest wall L98.498 Non-pressure chronic ulcer of skin of other sites with other specified severity L73.2 Hidradenitis suppurativa E11.622 Type 2 diabetes mellitus with other skin ulcer Facility Procedures : CPT4 Code: [id number] Description: 32440 - WOUND CARE VISIT-LEV 2 EST PT Modifier: Quantity: 1 Physician Procedures : CPT4 Code Description Modifier [id number] - WC PHYS LEVEL 3 - EST PT ICD-10 Diagnosis Description L98.498 Non-pressure chronic ulcer of skin of other sites with other specified severity E11.622 Type 2 diabetes mellitus with other skin ulcer L02.213  Cutaneous abscess of chest wall T81.31XD Disruption of external operation (surgical) wound, not elsewhere classified, subsequent encounter Quantity: 1 Electronic Signature(s) Signed: 11/25/2022 10:08:01 AM By: Geralyn Corwin DO Signed: 11/25/2022 12:49:31 PM By: Fonnie Mu RN Entered By: Fonnie Mu on 11/25/2022 09:57:36

## 2023-03-27 ENCOUNTER — Ambulatory Visit: Payer: Self-pay | Admitting: Nurse Practitioner

## 2023-04-07 ENCOUNTER — Ambulatory Visit (INDEPENDENT_AMBULATORY_CARE_PROVIDER_SITE_OTHER): Payer: No Typology Code available for payment source | Admitting: Nurse Practitioner

## 2023-04-07 ENCOUNTER — Encounter: Payer: Self-pay | Admitting: Nurse Practitioner

## 2023-04-07 VITALS — BP 131/72 | HR 96 | Temp 97.1°F | Wt 271.0 lb

## 2023-04-07 DIAGNOSIS — Z720 Tobacco use: Secondary | ICD-10-CM

## 2023-04-07 DIAGNOSIS — E1165 Type 2 diabetes mellitus with hyperglycemia: Secondary | ICD-10-CM | POA: Diagnosis not present

## 2023-04-07 DIAGNOSIS — E1140 Type 2 diabetes mellitus with diabetic neuropathy, unspecified: Secondary | ICD-10-CM

## 2023-04-07 DIAGNOSIS — I10 Essential (primary) hypertension: Secondary | ICD-10-CM

## 2023-04-07 DIAGNOSIS — E785 Hyperlipidemia, unspecified: Secondary | ICD-10-CM

## 2023-04-07 DIAGNOSIS — Z794 Long term (current) use of insulin: Secondary | ICD-10-CM | POA: Diagnosis not present

## 2023-04-07 LAB — POCT GLYCOSYLATED HEMOGLOBIN (HGB A1C): Hemoglobin A1C: 7.4 % — AB (ref 4.0–5.6)

## 2023-04-07 MED ORDER — SEMAGLUTIDE(0.25 OR 0.5MG/DOS) 2 MG/3ML ~~LOC~~ SOPN
0.2500 mg | PEN_INJECTOR | SUBCUTANEOUS | 0 refills | Status: DC
Start: 2023-04-07 — End: 2023-04-07

## 2023-04-07 MED ORDER — ACCU-CHEK GUIDE VI STRP: ORAL_STRIP | 2 refills | Status: AC

## 2023-04-07 MED ORDER — ACCU-CHEK SOFTCLIX LANCETS MISC: 12 refills | Status: DC

## 2023-04-07 MED ORDER — OZEMPIC (0.25 OR 0.5 MG/DOSE) 2 MG/3ML ~~LOC~~ SOPN
0.2500 mg | PEN_INJECTOR | SUBCUTANEOUS | 0 refills | Status: DC
Start: 2023-04-07 — End: 2023-04-07

## 2023-04-07 MED ORDER — GABAPENTIN 100 MG PO CAPS
100.0000 mg | ORAL_CAPSULE | Freq: Three times a day (TID) | ORAL | 3 refills | Status: AC
Start: 2023-04-07 — End: ?

## 2023-04-07 MED ORDER — SEMAGLUTIDE(0.25 OR 0.5MG/DOS) 2 MG/3ML ~~LOC~~ SOPN
0.2500 mg | PEN_INJECTOR | SUBCUTANEOUS | 0 refills | Status: DC
Start: 2023-04-07 — End: 2023-04-18

## 2023-04-07 MED ORDER — BD PEN NEEDLE NANO 2ND GEN 32G X 4 MM MISC: 1.0000 | Freq: Two times a day (BID) | 0 refills | Status: DC

## 2023-04-07 MED ORDER — NICOTINE 14 MG/24HR TD PT24
14.0000 mg | MEDICATED_PATCH | Freq: Every day | TRANSDERMAL | 0 refills | Status: DC
Start: 2023-04-07 — End: 2023-05-08

## 2023-04-07 MED ORDER — ACCU-CHEK FASTCLIX LANCETS MISC: 0 refills | Status: DC

## 2023-04-07 NOTE — Progress Notes (Addendum)
Established Patient Office Visit  Subjective:  Patient ID: [name], male    DOB: 02-01-1986  Age: 37 y.o. MRN: [id number]  CC:  Chief Complaint  Patient presents with   Medical Management of Chronic Issues    HPI [name] is a 37 y.o. 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   Patient presents for follow-up for his chronic medical conditions Chest wall cellulitis now resolved with his wound completely healed, he has since returned back to work since July.  Type 2 diabetes.  Currently on Humalog 75/25 mg 15 units twice daily, stated that he has been taking the medication only as needed when his blood sugar level is greater than 120.  He has not been taking the sliding scale NovoLog at all.  Patient stated that his diet can be better, has a physically demanding job and has started exercising more..  Takes atorvastatin 10 mg daily for hyperlipidemia.  Hypertension.  Currently on amlodipine 5 mg daily, lisinopril 5 mg daily.  No chest pain dizziness edema  Tobacco use disorder.  Smokes 10 to 12 cigarettes daily.  Would like nicotine patch ordered.  No shortness of breath cough wheezing.   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   Allergy    Asthma    Breast abscess 06/28/2022   Chest wall abscess 08/10/2022   Diabetes mellitus without complication (HCC)    Hyperlipidemia    Hypertension    Intertrigo 08/10/2022   Obesity    Tobacco use disorder    Vitamin D deficiency     Past Surgical History:  Procedure Laterality Date   INCISION AND DRAINAGE ABSCESS Left 07/01/2022   Procedure: INCISION AND DRAINAGE ABSCESS;  Surgeon: Griselda Miner, MD;  Location: [facility] OR;  Service:  General;  Laterality: Left;   IRRIGATION AND DEBRIDEMENT ABSCESS Left 07/03/2022   Procedure: IRRIGATION AND DEBRIDEMENT CHEST WALL / Breast ABSCESS;  Surgeon: Violeta Gelinas, MD;  Location: [facility] OR;  Service: General;  Laterality: Left;    Family History  Problem Relation Age of Onset   Diabetes Mother    Breast cancer Maternal Grandmother    Diabetes Paternal Grandmother    Diabetes Paternal Grandfather    Colon cancer Neg Hx     Social History   Socioeconomic History   Marital status: Single    Spouse name: Not on file   Number of children: 1   Years of education: Not on file   Highest education level: Not on file  Occupational History   Not on file  Tobacco Use   Smoking status: Every Day    Current packs/day: 0.30    Types: Cigarettes    Passive exposure: Never   Smokeless tobacco: Never  Substance and Sexual Activity   Alcohol use: Yes    Alcohol/week: 13.0 standard drinks of alcohol    Types: 9 Cans of beer, 4 Shots of liquor per week    Comment: occasional   Drug use: Not Currently    Types: Marijuana   Sexual activity: Yes  Other Topics Concern   Not on file  Social History Narrative   Lives with his mother.    Social Determinants of Health   Financial Resource Strain: Not on file  Food Insecurity: No Food Insecurity (06/28/2022)  Hunger Vital Sign    Worried About Running Out of Food in the Last Year: Never true    Ran Out of Food in the Last Year: Never true  Transportation Needs: No Transportation Needs (06/28/2022)   PRAPARE - Administrator, Civil Service (Medical): No    Lack of Transportation (Non-Medical): No  Physical Activity: Not on file  Stress: Not on file  Social Connections: Not on file  Intimate Partner Violence: Not At Risk (06/28/2022)   Humiliation, Afraid, Rape, and Kick questionnaire    Fear of Current or Ex-Partner: No    Emotionally Abused: No    Physically Abused: No    Sexually Abused: No    Outpatient  Medications Prior to Visit  Medication Sig Dispense Refill   amLODipine (NORVASC) 5 MG tablet Take 1 tablet (5 mg total) by mouth daily. 90 tablet 1   atorvastatin (LIPITOR) 10 MG tablet TAKE 1 TABLET BY MOUTH EVERY DAY 90 tablet 1   blood glucose meter kit and supplies KIT Use as directed 1 each 0   clindamycin (CLEOCIN T) 1 % external solution Apply topically daily. to skin underarms 60 mL 2   lisinopril (ZESTRIL) 5 MG tablet Take 1 tablet (5 mg total) by mouth daily. 90 tablet 3   Accu-Chek Softclix Lancets lancets Use as instructed 100 each 12   BD PEN NEEDLE NANO 2ND GEN 32G X 4 MM MISC USE AS DIRECTED FOR 4 TIMES A DAY FOR INSULIN PENS 100 each 0   gabapentin (NEURONTIN) 100 MG capsule Take 1 capsule (100 mg total) by mouth 3 (three) times daily. 90 capsule 3   glucose blood (ACCU-CHEK GUIDE) test strip USE AS DIRECTED 4 TIMES A DAY BEFORE MEALS AND AT BEDTIME 100 strip 2   insulin aspart (NOVOLOG) 100 UNIT/ML FlexPen Before each meal 3 times a day, CBG 140-199 - 2 units, 200-250 - 4 units, 251-299 - 6 units,  300-349 - 8 units,  350 or above-10 units. (Patient taking differently: Before each meal 3 times a day, CBG 140-199 - 2 units, 200-250 - 4 units, 251-299 - 6 units,  300-349 - 8 units,  350 or above-10 units.) 15 mL 0   Insulin Lispro Prot & Lispro (HUMALOG MIX 75/25 KWIKPEN) (75-25) 100 UNIT/ML Kwikpen Inject 15 Units into the skin 2 (two) times daily. 15 mL 11   acetaminophen (TYLENOL) 325 MG tablet Take 2 tablets (650 mg total) by mouth every 8 (eight) hours as needed for moderate pain. (Patient not taking: Reported on 04/07/2023) 20 tablet 0   Blood Pressure KIT Please check you blood pressure daily. (Patient not taking: Reported on 04/07/2023) 1 kit 0   ibuprofen (ADVIL) 200 MG tablet Take 200 mg by mouth every 6 (six) hours as needed for mild pain. (Patient not taking: Reported on 04/07/2023)     Probiotic Product (RISA-BID PROBIOTIC) TABS Take 1 tablet by mouth 2 (two) times daily.  (Patient not taking: Reported on 04/07/2023) 60 tablet 2   Vitamin D, Ergocalciferol, (DRISDOL) 1.25 MG (50000 UNIT) CAPS capsule Take 1 capsule (50,000 Units total) by mouth every 7 (seven) days. (Patient not taking: Reported on 04/07/2023) 8 capsule 0   Accu-Chek FastClix Lancets MISC Use as directed 4 times a day before meals and at bedtime 102 each 0   amoxicillin (AMOXIL) 500 MG capsule Take 1 capsule (500 mg total) by mouth 3 (three) times daily. 90 capsule 4   nystatin (MYCOSTATIN/NYSTOP) powder Apply 1 Application topically  3 (three) times daily. Can use as preventative powder for yeast infection 30 g 5   oxyCODONE 10 MG TABS Take 0.5-1 tablets (5-10 mg total) by mouth every 6 (six) hours as needed for severe pain or moderate pain (5mg  for moderate pain, 10mg  for severe pain). 25 tablet 0   triamcinolone acetonide (KENALOG) 10 MG/ML injection 10 mg      No facility-administered medications prior to visit.    Allergies  Allergen Reactions   Bee Pollen    Peach [Prunus Persica]     Pt reports swelling and lip tingling as reaction    ROS Review of Systems  Constitutional:  Negative for activity change, appetite change, chills, diaphoresis, fatigue, fever and unexpected weight change.  HENT:  Negative for congestion, dental problem, drooling and ear discharge.   Eyes:  Negative for pain, discharge, redness and itching.  Respiratory:  Negative for apnea, cough, choking, chest tightness, shortness of breath and wheezing.   Cardiovascular: Negative.  Negative for chest pain, palpitations and leg swelling.  Gastrointestinal:  Negative for abdominal distention, abdominal pain, anal bleeding, blood in stool, constipation, diarrhea and vomiting.  Endocrine: Negative for polydipsia, polyphagia and polyuria.  Genitourinary:  Negative for difficulty urinating, flank pain, frequency and genital sores.  Musculoskeletal: Negative.  Negative for arthralgias, back pain, gait problem and joint  swelling.  Skin:  Negative for color change, pallor and rash.  Neurological:  Negative for dizziness, facial asymmetry, light-headedness, numbness and headaches.  Psychiatric/Behavioral:  Negative for agitation, behavioral problems, confusion, hallucinations, self-injury, sleep disturbance and suicidal ideas.       Objective:    Physical Exam Vitals and nursing note reviewed.  Constitutional:      General: He is not in acute distress.    Appearance: Normal appearance. He is obese. He is not ill-appearing, toxic-appearing or diaphoretic.  HENT:     Mouth/Throat:     Mouth: Mucous membranes are moist.     Pharynx: Oropharynx is clear. No oropharyngeal exudate or posterior oropharyngeal erythema.  Eyes:     General: No scleral icterus.       Right eye: No discharge.        Left eye: No discharge.     Extraocular Movements: Extraocular movements intact.     Conjunctiva/sclera: Conjunctivae normal.  Cardiovascular:     Rate and Rhythm: Normal rate and regular rhythm.     Pulses: Normal pulses.     Heart sounds: Normal heart sounds. No murmur heard.    No friction rub. No gallop.  Pulmonary:     Effort: Pulmonary effort is normal. No respiratory distress.     Breath sounds: Normal breath sounds. No stridor. No wheezing, rhonchi or rales.  Chest:     Chest wall: No tenderness.  Abdominal:     General: There is no distension.     Palpations: Abdomen is soft.     Tenderness: There is no abdominal tenderness. There is no right CVA tenderness, left CVA tenderness or guarding.  Musculoskeletal:        General: No swelling, tenderness, deformity or signs of injury.     Right lower leg: No edema.     Left lower leg: No edema.  Skin:    General: Skin is warm and dry.     Capillary Refill: Capillary refill takes less than 2 seconds.     Coloration: Skin is not jaundiced or pale.     Findings: No bruising, erythema or lesion.  Neurological:  Mental Status: He is alert and oriented  to person, place, and time.     Motor: No weakness.     Coordination: Coordination normal.     Gait: Gait normal.  Psychiatric:        Mood and Affect: Mood normal.        Behavior: Behavior normal.        Thought Content: Thought content normal.        Judgment: Judgment normal.     BP 131/72   Pulse 96   Temp (!) 97.1 F (36.2 C)   Wt 271 lb (122.9 kg)   SpO2 100%   BMI 40.02 kg/m  Wt Readings from Last 3 Encounters:  04/07/23 271 lb (122.9 kg)  01/16/23 255 lb (115.7 kg)  11/23/22 246 lb 12.8 oz (111.9 kg)    Lab Results  Component Value Date   TSH 3.788 06/24/2022   Lab Results  Component Value Date   WBC 7.8 01/16/2023   HGB 14.6 01/16/2023   HCT 42.2 01/16/2023   MCV 80.7 01/16/2023   PLT 347 01/16/2023   Lab Results  Component Value Date   NA 137 01/16/2023   K 4.7 01/16/2023   CO2 21 01/16/2023   GLUCOSE 140 (H) 01/16/2023   BUN 13 01/16/2023   CREATININE 0.80 01/16/2023   BILITOT 0.8 01/16/2023   ALKPHOS 130 (H) 07/22/2022   AST 20 01/16/2023   ALT 31 01/16/2023   PROT 7.5 01/16/2023   ALBUMIN 3.6 (L) 07/22/2022   CALCIUM 9.7 01/16/2023   ANIONGAP 7 07/11/2022   EGFR 116 01/16/2023   Lab Results  Component Value Date   CHOL 173 10/26/2022   Lab Results  Component Value Date   HDL 57 10/26/2022   Lab Results  Component Value Date   LDLCALC 101 (H) 10/26/2022   Lab Results  Component Value Date   TRIG 82 10/26/2022   Lab Results  Component Value Date   CHOLHDL 3.0 10/26/2022   Lab Results  Component Value Date   HGBA1C 7.4 (A) 04/07/2023      Assessment & Plan:   Problem List Items Addressed This Visit       Cardiovascular and Mediastinum   Primary hypertension    BP Readings from Last 3 Encounters:  04/07/23 131/72  01/16/23 139/86  11/23/22 135/80   HTN Controlled on lisinopril 5 mg daily, amlodipine 5 mg daily Continue current medications. No changes in management. Discussed DASH diet and dietary sodium  restrictions Continue to increase dietary efforts and exercise.           Endocrine   Type 2 diabetes mellitus with hyperglycemia (HCC) - Primary    Lab Results  Component Value Date   HGBA1C 7.4 (A) 04/07/2023  A1c went up from 6.9 to 7.4 6 months ago Continue Humalog 75/25 mix 15 units twice daily I discussed switching the patient to a GLP-1 and slowly titrating the patient of Humalog mix. , Side effects of GLP-1 discussed with the patient.  He was encouraged to eat smaller portion of meals avoid fatty fried foods to prevent nausea.  No history of pancreatitis no personal or family history of medullary thyroid cancer Referral sent to the clinical pharmacist to assist with medication management Patient counseled on low-carb modified diet Encouraged engage in regular moderate exercise at least 150 minutes weekly And follow-up in 6 weeks   Addendum.  I have contacted Catie who advised that we stop Humalog 75/25 and start Ozempic 0.25mg   once weekly injection.  I have called the patient, patient was not reachable on the phone, left message with his mother about the plan.  Patient encouraged to call back if he has any question.  Told to stop insulin once he picks up the prescription for Ozempic      Relevant Medications   Semaglutide,0.25 or 0.5MG /DOS, 2 MG/3ML SOPN   Other Relevant Orders   POCT glycosylated hemoglobin (Hb A1C) (Completed)   Basic Metabolic Panel   Lipid panel   AMB Referral to Pharmacy Medication Management   Neuropathy due to type 2 diabetes mellitus (HCC)    Takes gabapentin as needed Gabapentin 100 mg 3 times daily refilled      Relevant Medications   gabapentin (NEURONTIN) 100 MG capsule   Semaglutide,0.25 or 0.5MG /DOS, 2 MG/3ML SOPN     Other   Tobacco abuse    Smokes about 10-12 cigarettes /day  Asked about quitting: confirms that he/she currently smokes cigarettes Advise to quit smoking: Educated about QUITTING to reduce the risk of cancer, cardio  and cerebrovascular disease. Assess willingness:willing to quit at this time, and working on cutting back. Assist with counseling and pharmacotherapy: Counseled for 5 minutes and literature provided. Arrange for follow up: follow up in 6 week and continue to offer help.  Nicotine patch 14mg  daily ordered      Relevant Medications   nicotine (NICODERM CQ - DOSED IN MG/24 HOURS) 14 mg/24hr patch   Morbid obesity (HCC)    Wt Readings from Last 3 Encounters:  04/07/23 271 lb (122.9 kg)  01/16/23 255 lb (115.7 kg)  11/23/22 246 lb 12.8 oz (111.9 kg)   Body mass index is 40.02 kg/m.  Patient has added 16 pounds in the past 3 months Patient counseled on low-carb modified diet Encouraged engage in regular moderate exercises at least 150 minutes weekly Benefits of healthy weights discussed Planning on starting a GLP-1, this will also assist with weight management      Relevant Medications   Semaglutide,0.25 or 0.5MG /DOS, 2 MG/3ML SOPN   Dyslipidemia, goal LDL below 70    Lab Results  Component Value Date   CHOL 173 10/26/2022   HDL 57 10/26/2022   LDLCALC 101 (H) 10/26/2022   TRIG 82 10/26/2022   CHOLHDL 3.0 10/26/2022  LDL goal is less than 70 Currently on atorvastatin 10 mg daily Check lipid panel       Meds ordered this encounter  Medications   Insulin Pen Needle (BD PEN NEEDLE NANO 2ND GEN) 32G X 4 MM MISC    Sig: Inject 1 each into the skin in the morning and at bedtime.    Dispense:  100 each    Refill:  0   Accu-Chek Softclix Lancets lancets    Sig: Use as instructed    Dispense:  100 each    Refill:  12   Accu-Chek FastClix Lancets MISC    Sig: Use as directed 4 times a day before meals and at bedtime    Dispense:  102 each    Refill:  0   DISCONTD: Semaglutide,0.25 or 0.5MG /DOS, (OZEMPIC, 0.25 OR 0.5 MG/DOSE,) 2 MG/3ML SOPN    Sig: Inject 0.25 mg into the skin once a week.    Dispense:  3 mL    Refill:  0   nicotine (NICODERM CQ - DOSED IN MG/24 HOURS) 14  mg/24hr patch    Sig: Place 1 patch (14 mg total) onto the skin daily.    Dispense:  28  patch    Refill:  0   gabapentin (NEURONTIN) 100 MG capsule    Sig: Take 1 capsule (100 mg total) by mouth 3 (three) times daily.    Dispense:  90 capsule    Refill:  3   glucose blood (ACCU-CHEK GUIDE) test strip    Sig: Use as instructed    Dispense:  100 strip    Refill:  2   DISCONTD: Semaglutide,0.25 or 0.5MG /DOS, 2 MG/3ML SOPN    Sig: Inject 0.25 mg into the skin once a week.    Dispense:  3 mL    Refill:  0   Semaglutide,0.25 or 0.5MG /DOS, 2 MG/3ML SOPN    Sig: Inject 0.25 mg into the skin once a week for 28 days.    Dispense:  3 mL    Refill:  0    Follow-up: Return in about 6 weeks (around 05/19/2023) for DM, smoking cessation.    Donell Beers, FNP

## 2023-04-07 NOTE — Assessment & Plan Note (Signed)
Smokes about 10-12 cigarettes /day  Asked about quitting: confirms that he/she currently smokes cigarettes Advise to quit smoking: Educated about QUITTING to reduce the risk of cancer, cardio and cerebrovascular disease. Assess willingness:willing to quit at this time, and working on cutting back. Assist with counseling and pharmacotherapy: Counseled for 5 minutes and literature provided. Arrange for follow up: follow up in 6 week and continue to offer help.  Nicotine patch 14mg  daily ordered

## 2023-04-07 NOTE — Patient Instructions (Signed)
1. Morbid obesity (HCC)   2. Type 2 diabetes mellitus with hyperglycemia, with long-term current use of insulin (HCC)  - POCT glycosylated hemoglobin (Hb A1C) - Basic Metabolic Panel - Lipid panel - AMB Referral to Pharmacy Medication Management      It is important that you exercise regularly at least 30 minutes 5 times a week as tolerated  Think about what you will eat, plan ahead. Choose " clean, green, fresh or frozen" over canned, processed or packaged foods which are more sugary, salty and fatty. 70 to 75% of food eaten should be vegetables and fruit. Three meals at set times with snacks allowed between meals, but they must be fruit or vegetables. Aim to eat over a 12 hour period , example 7 am to 7 pm, and STOP after  your last meal of the day. Drink water,generally about 64 ounces per day, no other drink is as healthy. Fruit juice is best enjoyed in a healthy way, by EATING the fruit.  Thanks for choosing [facility] we consider it a privelige to serve you.

## 2023-04-07 NOTE — Addendum Note (Signed)
Addended by: Donell Beers on: 04/07/2023 01:40 PM   Modules accepted: Orders, Level of Service

## 2023-04-07 NOTE — Assessment & Plan Note (Signed)
Lab Results  Component Value Date   CHOL 173 10/26/2022   HDL 57 10/26/2022   LDLCALC 101 (H) 10/26/2022   TRIG 82 10/26/2022   CHOLHDL 3.0 10/26/2022  LDL goal is less than 70 Currently on atorvastatin 10 mg daily Check lipid panel

## 2023-04-07 NOTE — Assessment & Plan Note (Signed)
BP Readings from Last 3 Encounters:  04/07/23 131/72  01/16/23 139/86  11/23/22 135/80   HTN Controlled on lisinopril 5 mg daily, amlodipine 5 mg daily Continue current medications. No changes in management. Discussed DASH diet and dietary sodium restrictions Continue to increase dietary efforts and exercise.

## 2023-04-07 NOTE — Assessment & Plan Note (Addendum)
Wt Readings from Last 3 Encounters:  04/07/23 271 lb (122.9 kg)  01/16/23 255 lb (115.7 kg)  11/23/22 246 lb 12.8 oz (111.9 kg)   Body mass index is 40.02 kg/m.  Patient has added 16 pounds in the past 3 months Patient counseled on low-carb modified diet Encouraged engage in regular moderate exercises at least 150 minutes weekly Benefits of healthy weights discussed Planning on starting a GLP-1, this will also assist with weight management

## 2023-04-07 NOTE — Assessment & Plan Note (Addendum)
Lab Results  Component Value Date   HGBA1C 7.4 (A) 04/07/2023  A1c went up from 6.9 to 7.4 6 months ago Continue Humalog 75/25 mix 15 units twice daily I discussed switching the patient to a GLP-1 and slowly titrating the patient of Humalog mix. , Side effects of GLP-1 discussed with the patient.  He was encouraged to eat smaller portion of meals avoid fatty fried foods to prevent nausea.  No history of pancreatitis no personal or family history of medullary thyroid cancer Referral sent to the clinical pharmacist to assist with medication management Patient counseled on low-carb modified diet Encouraged engage in regular moderate exercise at least 150 minutes weekly And follow-up in 6 weeks   Addendum.  I have contacted Catie who advised that we stop Humalog 75/25 and start Ozempic 0.25mg  once weekly injection.  I have called the patient, patient was not reachable on the phone, left message with his mother about the plan.  Patient encouraged to call back if he has any question.  Told to stop insulin once he picks up the prescription for Ozempic

## 2023-04-07 NOTE — Assessment & Plan Note (Signed)
Takes gabapentin as needed Gabapentin 100 mg 3 times daily refilled

## 2023-04-08 LAB — LIPID PANEL
Chol/HDL Ratio: 3.7 ratio (ref 0.0–5.0)
Cholesterol, Total: 205 mg/dL — ABNORMAL HIGH (ref 100–199)
HDL: 55 mg/dL (ref >39)
LDL Chol Calc (NIH): 130 mg/dL — ABNORMAL HIGH (ref 0–99)
Triglycerides: 113 mg/dL (ref 0–149)
VLDL Cholesterol Cal: 20 mg/dL (ref 5–40)

## 2023-04-08 LAB — BASIC METABOLIC PANEL
BUN/Creatinine Ratio: 15 (ref 9–20)
BUN: 12 mg/dL (ref 6–20)
CO2: 21 mmol/L (ref 20–29)
Calcium: 9.5 mg/dL (ref 8.7–10.2)
Chloride: 104 mmol/L (ref 96–106)
Creatinine, Ser: 0.8 mg/dL (ref 0.76–1.27)
Glucose: 145 mg/dL — ABNORMAL HIGH (ref 70–99)
Potassium: 4.4 mmol/L (ref 3.5–5.2)
Sodium: 139 mmol/L (ref 134–144)
eGFR: 115 mL/min/{1.73_m2} (ref >59)

## 2023-04-10 ENCOUNTER — Other Ambulatory Visit: Payer: Self-pay | Admitting: Nurse Practitioner

## 2023-04-10 MED ORDER — ATORVASTATIN CALCIUM 20 MG PO TABS: 20.0000 mg | ORAL_TABLET | Freq: Every day | ORAL | 1 refills | Status: DC

## 2023-04-11 ENCOUNTER — Telehealth: Payer: Self-pay

## 2023-04-11 NOTE — Progress Notes (Signed)
Care Guide Note  04/11/2023 Name: [name] MRN: [id number] DOB: [birth year]  Referred by: Donell Beers, FNP Reason for referral : Care Coordination (Outreach to schedule with Pharm d )   [name] is a 37 y.o. year old male who is a primary care patient of Donell Beers, FNP. [name] was referred to the pharmacist for assistance related to DM.    An unsuccessful telephone outreach was attempted today to contact the patient who was referred to the pharmacy team for assistance with medication management. Additional attempts will be made to contact the patient.   Penne Lash, RMA Care Guide [facility]  [place], [place] [address] Direct Dial: [phone] [email or web address]

## 2023-04-14 NOTE — Progress Notes (Signed)
Care Guide Note  04/14/2023 Name: [name] MRN: [id number] DOB: 1985/08/15  Referred by: Donell Beers, FNP Reason for referral : Care Coordination (Outreach to schedule with Pharm d )   [name] is a 37 y.o. year old male who is a primary care patient of Donell Beers, FNP. [name] was referred to the pharmacist for assistance related to DM.    Successful contact was made with the patient to discuss pharmacy services including being ready for the pharmacist to call at least 5 minutes before the scheduled appointment time, to have medication bottles and any blood sugar or blood pressure readings ready for review. The patient agreed to meet with the pharmacist via with the pharmacist via telephone visit on (date/time).  04/17/2023  Penne Lash, RMA Care Guide [facility]  [place], [place] [address] Direct Dial: [phone] [email or web address]

## 2023-04-17 ENCOUNTER — Other Ambulatory Visit: Payer: No Typology Code available for payment source | Admitting: Pharmacist

## 2023-04-17 NOTE — Progress Notes (Signed)
04/17/2023 Name: [name] MRN: [id number] DOB: 1986-06-24  Chief Complaint  Patient presents with   Diabetes   Hyperlipidemia    [name] is a 37 y.o. year old male who presented for a telephone visit.   They were referred to the pharmacist by their PCP for assistance in managing diabetes.   *Newly diagnosed with DM a year ago  Subjective:  Care Team: Primary Care Provider: Donell Beers, FNP ; Next Scheduled Visit: 05/19/23 Clinical Pharmacist: Marlowe Aschoff, PharmD  Medication Access/Adherence  Current Pharmacy:  [facility] # [address] [place] - [address] [place] [address] Phone: [phone] Fax: [phone]   Patient reports affordability concerns with their medications: No  Patient reports access/transportation concerns to their pharmacy: No  Patient reports adherence concerns with their medications:  No     Diabetes:  Current medications: Humalog 75/25- 15 U twice a day Medications tried in the past: Novolog (hasn't needed it)  Using Accu-Chek Guide meter; testing 1 times daily   Patient denies hypoglycemic s/sx including dizziness, shakiness, sweating. Patient denies hyperglycemic symptoms including polyuria, polydipsia, polyphagia, nocturia, neuropathy, blurred vision.  Current meal patterns:  - Breakfast: Skips - Lunch/supper: Chicken, Healthy Choices meals Advised to avoid carbs (opt for whole wheat bread, carb balance wraps, and less rice if possible)  Current physical activity: None due to work- used to walk about 2 months ago  Current medication access support: Nurse, learning disability   Hyperlipidemia/ASCVD Risk Reduction  Current lipid lowering medications: Atorvastatin 20mg  Medications tried in the past: None  Antiplatelet regimen: None  10-year ASCVD risk: 16.3%     Tobacco Abuse:  Tobacco Use History: Number of cigarettes per day 14-20     Objective:  Lab Results   Component Value Date   HGBA1C 7.4 (A) 04/07/2023    Lab Results  Component Value Date   CREATININE 0.80 04/07/2023   BUN 12 04/07/2023   NA 139 04/07/2023   K 4.4 04/07/2023   CL 104 04/07/2023   CO2 21 04/07/2023    Lab Results  Component Value Date   CHOL 205 (H) 04/07/2023   HDL 55 04/07/2023   LDLCALC 130 (H) 04/07/2023   TRIG 113 04/07/2023   CHOLHDL 3.7 04/07/2023    Medications Reviewed Today     Reviewed by Pollie Friar, RPH (Pharmacist) on 04/17/23 at 1537  Med List Status: <None>   Medication Order Taking? Sig Documenting Provider Last Dose Status Informant  Accu-Chek FastClix Lancets MISC [id number] Yes Use as directed 4 times a day before meals and at bedtime Donell Beers, FNP Taking Active   Accu-Chek Softclix Lancets lancets [id number] Yes Use as instructed [name], [name], FNP Taking Active   acetaminophen (TYLENOL) 325 MG tablet [id number]  Take 2 tablets (650 mg total) by mouth every 8 (eight) hours as needed for moderate pain.  Patient not taking: Reported on 04/07/2023   [name], MD  Active            Med Note [name], [facility]   Fri Jul 22, 2022  8:54 AM) Prn   amLODipine (NORVASC) 5 MG tablet [id number] Yes Take 1 tablet (5 mg total) by mouth daily. Donell Beers, FNP Taking Active   atorvastatin (LIPITOR) 20 MG tablet [id number] Yes Take 1 tablet (20 mg total) by mouth daily. Donell Beers, FNP Taking Active   blood glucose meter kit and supplies KIT [id number] Yes Use as  directed [name], MD Taking Active   Blood Pressure KIT [id number] No Please check you blood pressure daily.  Patient not taking: Reported on 04/07/2023   Donell Beers, FNP Not Taking Active   clindamycin (CLEOCIN T) 1 % external solution [id number] Yes Apply topically daily. to skin underarms Terri Piedra, DO Taking Active   gabapentin (NEURONTIN) 100 MG capsule [id number] Yes Take 1 capsule (100 mg total) by mouth 3 (three) times  daily. Donell Beers, FNP Taking Active            Med Note [name], Rigo Letts M   Mon Apr 17, 2023  3:13 PM) Takes PRN  glucose blood (ACCU-CHEK GUIDE) test strip [id number] Yes Use as instructed Donell Beers, FNP Taking Active   ibuprofen (ADVIL) 200 MG tablet [id number] No Take 200 mg by mouth every 6 (six) hours as needed for mild pain.  Patient not taking: Reported on 04/17/2023   [provider] Not Taking Active Self           Med Note [name], [facility]   Fri Apr 07, 2023 11:32 AM) PRN  Insulin Pen Needle (BD PEN NEEDLE NANO 2ND GEN) 32G X 4 MM MISC [id number] Yes Inject 1 each into the skin in the morning and at bedtime. Donell Beers, FNP Taking Active   lisinopril (ZESTRIL) 5 MG tablet [id number] Yes Take 1 tablet (5 mg total) by mouth daily. Donell Beers, FNP Taking Active   nicotine (NICODERM CQ - DOSED IN MG/24 HOURS) 14 mg/24hr patch [id number] Yes Place 1 patch (14 mg total) onto the skin daily. Donell Beers, FNP Taking Active   Probiotic Product (RISA-BID PROBIOTIC) TABS [id number] No Take 1 tablet by mouth 2 (two) times daily.  Patient not taking: Reported on 04/07/2023   [name], MD Not Taking Active   Semaglutide,0.25 or 0.5MG /DOS, 2 MG/3ML [name] [id number] Yes Inject 0.25 mg into the skin once a week for 28 days. Donell Beers, FNP Taking Active   Vitamin D, Ergocalciferol, (DRISDOL) 1.25 MG (50000 UNIT) CAPS capsule [id number] No Take 1 capsule (50,000 Units total) by mouth every 7 (seven) days.  Patient not taking: Reported on 04/07/2023   Donell Beers, FNP Not Taking Active               Assessment/Plan:   Diabetes: - Currently controlled - Reviewed long term cardiovascular and renal outcomes of uncontrolled blood sugar - Reviewed goal A1c, goal fasting, and goal 2 hour post prandial glucose - Reviewed dietary modifications including less carbs - Recommend to check glucose daily    Hyperlipidemia/ASCVD Risk  Reduction: - Currently uncontrolled.  - Reviewed long term complications of uncontrolled cholesterol - Recommend adherence to Atorvastatin 20mg  daily - Extensive discussion regarding foods that can affect both DM and HLD    Tobacco Abuse - Currently uncontrolled - Provided motivational interviewing to assess tobacco use and strategies for reduction - Provided information on 1 800 QUIT NOW support program - CONTINUE nicotine patch 14 mg daily. Counseled on proper placement and potential side effects, including mild itching/redness at the location site, headache, trouble sleeping and/or vivid dreams. Advised to remove patch at night if development of trouble sleeping.  - Patch schedule for <10 cigarettes daily: Apply one 14 mg patch daily for 6 weeks. Then, reduce to one 7 mg patch daily, if able.   **Follow Up Plan:** - Follow-up in 4 weeks to review Ozempic initiation and smoking cessation - Currently  stopping smoking TODAY cold [place] (hasn't had one all day) and using the nicotine 14mg  patch daily- aware to take off prior to bed as he used these in the hospital - Call [facility] about Ozempic status- PCP said to stop insulin at that time - Goal of weight loss to allow for less medications in the future   Length of visit: 32 minutes   Marlowe Aschoff, PharmD [facility] Phone Number: [phone]

## 2023-04-18 ENCOUNTER — Telehealth: Payer: Self-pay | Admitting: Pharmacist

## 2023-04-18 DIAGNOSIS — Z794 Long term (current) use of insulin: Secondary | ICD-10-CM

## 2023-04-18 NOTE — Progress Notes (Signed)
04/18/2023  Patient ID: [name], male   DOB: 1986-01-29, 37 y.o.   MRN: [id number]  Spoke to the patient on the phone. Advised cost of Ozempic is $900 at the pharmacy due to high-deductible health plan. Other option is $300 for compounded Ozempic at [facility]- could last 10 weeks at 0.5mg  weekly (technically $30 a week). Unable to afford currently and Farxiga/Jardiance would be just as expensive per insurance.   Due to this, advised best option is to start Metformin XR 1000mg  daily. Will start with 1 tablet a day and increase to 2 tablets a day as tolerated. Will also decrease Humalog insulin to 10U twice a day. Should NOT take if BG is around 120's prior to injection time and not planning to eat. Med list updated.  Length of call: 9 minutes   Marlowe Aschoff, PharmD [facility] Phone Number: [phone]

## 2023-04-21 MED ORDER — METFORMIN HCL ER 500 MG PO TB24: ORAL_TABLET | ORAL | 1 refills | Status: DC

## 2023-05-08 ENCOUNTER — Other Ambulatory Visit: Payer: No Typology Code available for payment source | Admitting: Pharmacist

## 2023-05-08 DIAGNOSIS — Z720 Tobacco use: Secondary | ICD-10-CM

## 2023-05-08 MED ORDER — NICOTINE 14 MG/24HR TD PT24: 14.0000 mg | MEDICATED_PATCH | Freq: Every day | TRANSDERMAL | 1 refills | Status: DC

## 2023-05-08 NOTE — Progress Notes (Signed)
05/08/2023 Name: [name] MRN: [id number] DOB: [birth year]  Chief Complaint  Patient presents with   Diabetes   Nicotine Dependence    [name] is a 37 y.o. year old male who presented for a telephone visit.   They were referred to the pharmacist by their PCP for assistance in managing diabetes.   *Newly diagnosed with DM a year ago  Subjective:  Care Team: Primary Care Provider: Donell Beers, FNP ; Next Scheduled Visit: 05/19/23 Clinical Pharmacist: Marlowe Aschoff, PharmD  Medication Access/Adherence  Current Pharmacy:  [facility] # [address] [name] - [address] [place] [address] Phone: [phone] Fax: [phone]   Patient reports affordability concerns with their medications: No  Patient reports access/transportation concerns to their pharmacy: No  Patient reports adherence concerns with their medications:  No     Diabetes:  Current medications: Humalog 75/25- 15 U twice a day- hasn't taken in the past week with Metformin added Medications tried in the past: Novolog (hasn't needed it)  Using Accu-Chek Guide meter; testing 1 times daily   Patient denies hypoglycemic s/sx including dizziness, shakiness, sweating. Patient denies hyperglycemic symptoms including polyuria, polydipsia, polyphagia, nocturia, neuropathy, blurred vision.  Current meal patterns:  - Breakfast: Skips - Lunch/supper: Chicken, Healthy Choices meals Advised to avoid carbs (opt for whole wheat bread, carb balance wraps, and less rice if possible)  Current physical activity: None due to work- used to walk about 2 months ago  Current medication access support: Nurse, learning disability   Hyperlipidemia/ASCVD Risk Reduction  Current lipid lowering medications: Atorvastatin 20mg  Medications tried in the past: None  Antiplatelet regimen: None  10-year ASCVD risk: 16.3%     Tobacco Abuse:  Tobacco Use History: Number of  cigarettes per day 14-20  As of 10/14 visit, averaging 5 cigarettes per day now    Objective:  Lab Results  Component Value Date   HGBA1C 7.4 (A) 04/07/2023    Lab Results  Component Value Date   CREATININE 0.80 04/07/2023   BUN 12 04/07/2023   NA 139 04/07/2023   K 4.4 04/07/2023   CL 104 04/07/2023   CO2 21 04/07/2023    Lab Results  Component Value Date   CHOL 205 (H) 04/07/2023   HDL 55 04/07/2023   LDLCALC 130 (H) 04/07/2023   TRIG 113 04/07/2023   CHOLHDL 3.7 04/07/2023    Medications Reviewed Today   Medications were not reviewed in this encounter       Assessment/Plan:   Diabetes: - Currently controlled - Reviewed long term cardiovascular and renal outcomes of uncontrolled blood sugar - Reviewed goal A1c, goal fasting, and goal 2 hour post prandial glucose - Reviewed dietary modifications including less carbs - Recommend to check glucose daily    Hyperlipidemia/ASCVD Risk Reduction: - Currently uncontrolled.  - Reviewed long term complications of uncontrolled cholesterol - Recommend adherence to Atorvastatin 20mg  daily - Extensive discussion regarding foods that can affect both DM and HLD    Tobacco Abuse - Currently uncontrolled - Provided motivational interviewing to assess tobacco use and strategies for reduction - Provided information on 1 800 QUIT NOW support program - CONTINUE nicotine patch 14 mg daily. Counseled on proper placement and potential side effects, including mild itching/redness at the location site, headache, trouble sleeping and/or vivid dreams. Advised to remove patch at night if development of trouble sleeping.  - Patch schedule for <10 cigarettes daily: Apply one 14 mg patch daily for  6 weeks. Then, reduce to one 7 mg patch daily, if able.   **Follow Up Plan:** - Follow-up in 4 weeks to review metformin continuation and smoking cessation - Continues using Nicotine 14mg  patch daily with average of 5 cigarettes per day;  reports skipping patch 2 days last week and smoking more due to stress - Continue patches for another 4 weeks- on week 2 currently; will need 2 more weeks sent to pharmacy - Difficulty not smoking at work when stressed out due to getting smoke breaks - Goal of weight loss to allow for less medications in the future - MODIFY Metformin XR to 2 tablets in the morning with breakfast now; BG has been lower and hasn't needed Humalog anymore     Marlowe Aschoff, PharmD [facility] Phone Number: [phone]

## 2023-05-18 ENCOUNTER — Ambulatory Visit: Payer: No Typology Code available for payment source | Admitting: Dermatology

## 2023-05-19 ENCOUNTER — Encounter: Payer: Self-pay | Admitting: Nurse Practitioner

## 2023-05-19 ENCOUNTER — Ambulatory Visit (INDEPENDENT_AMBULATORY_CARE_PROVIDER_SITE_OTHER): Payer: No Typology Code available for payment source | Admitting: Nurse Practitioner

## 2023-05-19 VITALS — BP 133/79 | HR 89 | Temp 97.1°F | Wt 270.0 lb

## 2023-05-19 DIAGNOSIS — I10 Essential (primary) hypertension: Secondary | ICD-10-CM | POA: Diagnosis not present

## 2023-05-19 DIAGNOSIS — Z720 Tobacco use: Secondary | ICD-10-CM | POA: Diagnosis not present

## 2023-05-19 DIAGNOSIS — E785 Hyperlipidemia, unspecified: Secondary | ICD-10-CM | POA: Diagnosis not present

## 2023-05-19 DIAGNOSIS — E1165 Type 2 diabetes mellitus with hyperglycemia: Secondary | ICD-10-CM

## 2023-05-19 NOTE — Patient Instructions (Signed)
Goal for fasting blood sugar ranges from 80 to 120 and 2 hours after any meal or at bedtime should be between 130 to 170.    1. Primary hypertension   2. Neuropathy due to type 2 diabetes mellitus (HCC)   3. Dyslipidemia, goal LDL below 70   4. Tobacco abuse   It is important that you exercise regularly at least 30 minutes 5 times a week as tolerated  Think about what you will eat, plan ahead. Choose " clean, green, fresh or frozen" over canned, processed or packaged foods which are more sugary, salty and fatty. 70 to 75% of food eaten should be vegetables and fruit. Three meals at set times with snacks allowed between meals, but they must be fruit or vegetables. Aim to eat over a 12 hour period , example 7 am to 7 pm, and STOP after  your last meal of the day. Drink water,generally about 64 ounces per day, no other drink is as healthy. Fruit juice is best enjoyed in a healthy way, by EATING the fruit.  Thanks for choosing [facility] we consider it a privelige to serve you.

## 2023-05-19 NOTE — Assessment & Plan Note (Signed)
He has called them to 5 to 6 cigarettes daily Patient congratulated on his efforts towards smoking cessation Continue nicotine patch as ordered

## 2023-05-19 NOTE — Assessment & Plan Note (Signed)
Lab Results  Component Value Date   HGBA1C 7.4 (A) 04/07/2023  Currently on metformin XR 1000 mg daily, has not been taking insulin GLP-1 was not covered by his insurance Patient reports fasting blood sugars of 120s to 130s Continue current medication Patient counseled on low-carb modified diet, encouraged to engage in regular moderate exercise and this 150 minutes weekly He missed his last appointment for diabetic eye exam, stated that the appointment has been rescheduled for next year Follow-up in 2 months Appreciate collaboration with the clinical pharmacist

## 2023-05-19 NOTE — Progress Notes (Signed)
Established Patient Office Visit  Subjective:  Patient ID: [name], male    DOB: Dec 19, 1985  Age: 37 y.o. MRN: [id number]  CC:  Chief Complaint  Patient presents with   Diabetes    Med change    HPI [name] is a 37 y.o. 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  Patient presents for follow-up for type 2 diabetes He denies any adverse reactions to current medications Please see assessment and plan section for full HPI    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   Allergy    Asthma    Breast abscess 06/28/2022   Chest wall abscess 08/10/2022   Diabetes mellitus without complication (HCC)    Hyperlipidemia    Hypertension    Intertrigo 08/10/2022   Obesity    Tobacco use disorder    Vitamin D deficiency     Past Surgical History:  Procedure Laterality Date   INCISION AND DRAINAGE ABSCESS Left 07/01/2022   Procedure: INCISION AND DRAINAGE ABSCESS;  Surgeon: Griselda Miner, MD;  Location: [facility] OR;  Service: General;  Laterality: Left;   IRRIGATION AND DEBRIDEMENT ABSCESS Left 07/03/2022   Procedure: IRRIGATION AND DEBRIDEMENT CHEST WALL / Breast ABSCESS;  Surgeon: Violeta Gelinas, MD;  Location: [facility] OR;  Service: General;  Laterality: Left;    Family History  Problem Relation Age of Onset   Diabetes Mother    Breast cancer Maternal Grandmother    Diabetes Paternal Grandmother    Diabetes Paternal Grandfather    Colon cancer Neg Hx     Social History   Socioeconomic History   Marital status: Single    Spouse name: Not on file   Number of children: 1   Years of education: Not on file   Highest education level: Not on file  Occupational History   Not on file   Tobacco Use   Smoking status: Every Day    Current packs/day: 0.30    Types: Cigarettes    Passive exposure: Never   Smokeless tobacco: Never  Substance and Sexual Activity   Alcohol use: Yes    Alcohol/week: 13.0 standard drinks of alcohol    Types: 9 Cans of beer, 4 Shots of liquor per week    Comment: occasional   Drug use: Not Currently    Types: Marijuana   Sexual activity: Yes  Other Topics Concern   Not on file  Social History Narrative   Lives with his mother.    Social Determinants of Health   Financial Resource Strain: Not on file  Food Insecurity: No Food Insecurity (06/28/2022)   Hunger Vital Sign    Worried About Running Out of Food in the Last Year: Never true    Ran Out of Food in the Last Year: Never true  Transportation Needs: No Transportation Needs (06/28/2022)   PRAPARE - Administrator, Civil Service (Medical): No    Lack of Transportation (Non-Medical): No  Physical Activity: Not on file  Stress: Not on file  Social Connections: Not on file  Intimate Partner Violence: Not At Risk (06/28/2022)   Humiliation, Afraid, Rape, and Kick questionnaire    Fear of Current or Ex-Partner: No    Emotionally Abused: No    Physically Abused: No    Sexually  Abused: No    Outpatient Medications Prior to Visit  Medication Sig Dispense Refill   Accu-Chek FastClix Lancets MISC Use as directed 4 times a day before meals and at bedtime 102 each 0   Accu-Chek Softclix Lancets lancets Use as instructed 100 each 12   amLODipine (NORVASC) 5 MG tablet Take 1 tablet (5 mg total) by mouth daily. 90 tablet 1   atorvastatin (LIPITOR) 20 MG tablet Take 1 tablet (20 mg total) by mouth daily. 90 tablet 1   blood glucose meter kit and supplies KIT Use as directed 1 each 0   glucose blood (ACCU-CHEK GUIDE) test strip Use as instructed 100 strip 2   lisinopril (ZESTRIL) 5 MG tablet Take 1 tablet (5 mg total) by mouth daily. 90 tablet 3   metFORMIN (GLUCOPHAGE-XR) 500 MG  24 hr tablet Take 2 tablets with food daily. 180 tablet 1   nicotine (NICODERM CQ - DOSED IN MG/24 HOURS) 14 mg/24hr patch Place 1 patch (14 mg total) onto the skin daily. 14 patch 1   acetaminophen (TYLENOL) 325 MG tablet Take 2 tablets (650 mg total) by mouth every 8 (eight) hours as needed for moderate pain. (Patient not taking: Reported on 04/07/2023) 20 tablet 0   Blood Pressure KIT Please check you blood pressure daily. (Patient not taking: Reported on 05/19/2023) 1 kit 0   clindamycin (CLEOCIN T) 1 % external solution Apply topically daily. to skin underarms (Patient not taking: Reported on 05/19/2023) 60 mL 2   gabapentin (NEURONTIN) 100 MG capsule Take 1 capsule (100 mg total) by mouth 3 (three) times daily. (Patient not taking: Reported on 05/19/2023) 90 capsule 3   Probiotic Product (RISA-BID PROBIOTIC) TABS Take 1 tablet by mouth 2 (two) times daily. (Patient not taking: Reported on 04/07/2023) 60 tablet 2   ibuprofen (ADVIL) 200 MG tablet Take 200 mg by mouth every 6 (six) hours as needed for mild pain. (Patient not taking: Reported on 04/17/2023)     insulin lispro protamine-lispro (HUMALOG 75/25 MIX) (75-25) 100 UNIT/ML SUSP injection Inject 10 Units into the skin 2 (two) times daily with a meal. (Patient not taking: Reported on 05/19/2023)     Insulin Pen Needle (BD PEN NEEDLE NANO 2ND GEN) 32G X 4 MM MISC Inject 1 each into the skin in the morning and at bedtime. (Patient not taking: Reported on 05/19/2023) 100 each 0   Vitamin D, Ergocalciferol, (DRISDOL) 1.25 MG (50000 UNIT) CAPS capsule Take 1 capsule (50,000 Units total) by mouth every 7 (seven) days. (Patient not taking: Reported on 04/07/2023) 8 capsule 0   No facility-administered medications prior to visit.    Allergies  Allergen Reactions   Bee Pollen    Peach [Prunus Persica]     Pt reports swelling and lip tingling as reaction    ROS Review of Systems  Constitutional:  Negative for activity change, appetite change,  chills, diaphoresis, fatigue, fever and unexpected weight change.  HENT:  Negative for congestion, dental problem, drooling and ear discharge.   Eyes:  Negative for pain, discharge, redness and itching.  Respiratory:  Negative for apnea, cough, choking, chest tightness, shortness of breath and wheezing.   Cardiovascular: Negative.  Negative for chest pain, palpitations and leg swelling.  Gastrointestinal:  Negative for abdominal distention, abdominal pain, anal bleeding, blood in stool, constipation, diarrhea and vomiting.  Endocrine: Negative for polydipsia, polyphagia and polyuria.  Genitourinary:  Negative for difficulty urinating, flank pain, frequency and genital sores.  Musculoskeletal: Negative.  Negative for  arthralgias, back pain, gait problem and joint swelling.  Skin:  Negative for color change, pallor and rash.  Neurological:  Negative for dizziness, facial asymmetry, light-headedness, numbness and headaches.  Psychiatric/Behavioral:  Negative for agitation, behavioral problems, confusion, hallucinations, self-injury, sleep disturbance and suicidal ideas.       Objective:    Physical Exam Vitals and nursing note reviewed.  Constitutional:      General: He is not in acute distress.    Appearance: Normal appearance. He is obese. He is not ill-appearing, toxic-appearing or diaphoretic.  HENT:     Mouth/Throat:     Mouth: Mucous membranes are moist.     Pharynx: Oropharynx is clear. No oropharyngeal exudate or posterior oropharyngeal erythema.  Eyes:     General: No scleral icterus.       Right eye: No discharge.        Left eye: No discharge.     Extraocular Movements: Extraocular movements intact.     Conjunctiva/sclera: Conjunctivae normal.  Cardiovascular:     Rate and Rhythm: Normal rate and regular rhythm.     Pulses: Normal pulses.     Heart sounds: Normal heart sounds. No murmur heard.    No friction rub. No gallop.  Pulmonary:     Effort: Pulmonary effort is  normal. No respiratory distress.     Breath sounds: Normal breath sounds. No stridor. No wheezing, rhonchi or rales.  Chest:     Chest wall: No tenderness.  Abdominal:     General: There is no distension.     Palpations: Abdomen is soft.     Tenderness: There is no abdominal tenderness. There is no right CVA tenderness, left CVA tenderness or guarding.  Musculoskeletal:        General: No swelling, tenderness, deformity or signs of injury.     Right lower leg: No edema.     Left lower leg: No edema.  Skin:    General: Skin is warm and dry.     Capillary Refill: Capillary refill takes less than 2 seconds.     Coloration: Skin is not jaundiced or pale.     Findings: No bruising, erythema or lesion.  Neurological:     Mental Status: He is alert and oriented to person, place, and time.     Motor: No weakness.     Coordination: Coordination normal.     Gait: Gait normal.  Psychiatric:        Mood and Affect: Mood normal.        Behavior: Behavior normal.        Thought Content: Thought content normal.        Judgment: Judgment normal.     BP 133/79   Pulse 89   Temp (!) 97.1 F (36.2 C)   Wt 270 lb (122.5 kg)   SpO2 100%   BMI 39.87 kg/m  Wt Readings from Last 3 Encounters:  05/19/23 270 lb (122.5 kg)  04/07/23 271 lb (122.9 kg)  01/16/23 255 lb (115.7 kg)    Lab Results  Component Value Date   TSH 3.788 06/24/2022   Lab Results  Component Value Date   WBC 7.8 01/16/2023   HGB 14.6 01/16/2023   HCT 42.2 01/16/2023   MCV 80.7 01/16/2023   PLT 347 01/16/2023   Lab Results  Component Value Date   NA 139 04/07/2023   K 4.4 04/07/2023   CO2 21 04/07/2023   GLUCOSE 145 (H) 04/07/2023   BUN 12 04/07/2023  CREATININE 0.80 04/07/2023   BILITOT 0.8 01/16/2023   ALKPHOS 130 (H) 07/22/2022   AST 20 01/16/2023   ALT 31 01/16/2023   PROT 7.5 01/16/2023   ALBUMIN 3.6 (L) 07/22/2022   CALCIUM 9.5 04/07/2023   ANIONGAP 7 07/11/2022   EGFR 115 04/07/2023   Lab  Results  Component Value Date   CHOL 205 (H) 04/07/2023   Lab Results  Component Value Date   HDL 55 04/07/2023   Lab Results  Component Value Date   LDLCALC 130 (H) 04/07/2023   Lab Results  Component Value Date   TRIG 113 04/07/2023   Lab Results  Component Value Date   CHOLHDL 3.7 04/07/2023   Lab Results  Component Value Date   HGBA1C 7.4 (A) 04/07/2023      Assessment & Plan:   Problem List Items Addressed This Visit       Cardiovascular and Mediastinum   Primary hypertension    BP Readings from Last 3 Encounters:  05/19/23 133/79  04/07/23 131/72  01/16/23 139/86   HTN Controlled .  On amlodipine 5 mg daily, lisinopril 5 mg daily Continue current medications. No changes in management. Discussed DASH diet and dietary sodium restrictions Continue to increase dietary efforts and exercise.           Endocrine   Type 2 diabetes mellitus with hyperglycemia (HCC) - Primary    Lab Results  Component Value Date   HGBA1C 7.4 (A) 04/07/2023  Currently on metformin XR 1000 mg daily, has not been taking insulin GLP-1 was not covered by his insurance Patient reports fasting blood sugars of 120s to 130s Continue current medication Patient counseled on low-carb modified diet, encouraged to engage in regular moderate exercise and this 150 minutes weekly He missed his last appointment for diabetic eye exam, stated that the appointment has been rescheduled for next year Follow-up in 2 months Appreciate collaboration with the clinical pharmacist         Other   Tobacco abuse    He has called them to 5 to 6 cigarettes daily Patient congratulated on his efforts towards smoking cessation Continue nicotine patch as ordered      Dyslipidemia, goal LDL below 70    Lab Results  Component Value Date   CHOL 205 (H) 04/07/2023   HDL 55 04/07/2023   LDLCALC 130 (H) 04/07/2023   TRIG 113 04/07/2023   CHOLHDL 3.7 04/07/2023  Currently on atorvastatin 20 mg  daily Checking lipid panel Avoid fatty fried foods      Relevant Orders   Lipid panel    No orders of the defined types were placed in this encounter.   Follow-up: Return in about 2 months (around 07/19/2023) for HYPERLIPIDEMIA, DM.    Donell Beers, FNP

## 2023-05-19 NOTE — Assessment & Plan Note (Signed)
Lab Results  Component Value Date   CHOL 205 (H) 04/07/2023   HDL 55 04/07/2023   LDLCALC 130 (H) 04/07/2023   TRIG 113 04/07/2023   CHOLHDL 3.7 04/07/2023  Currently on atorvastatin 20 mg daily Checking lipid panel Avoid fatty fried foods

## 2023-05-19 NOTE — Assessment & Plan Note (Signed)
BP Readings from Last 3 Encounters:  05/19/23 133/79  04/07/23 131/72  01/16/23 139/86   HTN Controlled .  On amlodipine 5 mg daily, lisinopril 5 mg daily Continue current medications. No changes in management. Discussed DASH diet and dietary sodium restrictions Continue to increase dietary efforts and exercise.

## 2023-06-02 ENCOUNTER — Other Ambulatory Visit: Payer: Self-pay | Admitting: Nurse Practitioner

## 2023-06-05 ENCOUNTER — Other Ambulatory Visit: Payer: No Typology Code available for payment source | Admitting: Pharmacist

## 2023-06-05 DIAGNOSIS — I10 Essential (primary) hypertension: Secondary | ICD-10-CM

## 2023-06-05 MED ORDER — AMLODIPINE BESYLATE 5 MG PO TABS: 5.0000 mg | ORAL_TABLET | Freq: Every day | ORAL | 1 refills | Status: DC

## 2023-06-05 NOTE — Progress Notes (Signed)
06/05/2023 Name: [name] MRN: [id number] DOB: Oct 03, 1985  Chief Complaint  Patient presents with   Diabetes    [name] is a 37 y.o. year old male who presented for a telephone visit.   They were referred to the pharmacist by their PCP for assistance in managing diabetes.   *Newly diagnosed with DM a year ago  Subjective:  Care Team: Primary Care Provider: Donell Beers, FNP ; Next Scheduled Visit: 05/19/23 Clinical Pharmacist: [name], PharmD  Medication Access/Adherence  Current Pharmacy:  [facility] [address], [place] - [address] [place] [address] Phone: [phone] Fax: [phone]   Patient reports affordability concerns with their medications: No  Patient reports access/transportation concerns to their pharmacy: No  Patient reports adherence concerns with their medications:  No     Diabetes:  Current medications: Metformin XR 500mg  2 tablets daily Medications tried in the past: Novolog (hasn't needed it), Humalog 75/25 (15 U twice a day- hasn't needed it)  Recent BG readings: FBG around 120-130 most mornings Using Accu-Chek Guide meter; testing 1 times daily   Patient denies hypoglycemic s/sx including dizziness, shakiness, sweating. Patient denies hyperglycemic symptoms including polyuria, polydipsia, polyphagia, nocturia, neuropathy, blurred vision.  Current meal patterns:  - Breakfast: Skips - Lunch/supper: Chicken, Healthy Choices meals Advised to avoid carbs (opt for whole wheat bread, carb balance wraps, and less rice if possible)  Current physical activity: None due to work- used to walk about 2 months ago  Current medication access support: Nurse, learning disability- high-deductible health plan   Hyperlipidemia/ASCVD Risk Reduction  Current lipid lowering medications: Atorvastatin 20mg  Medications tried in the past: None  Antiplatelet regimen: None  10-year ASCVD risk:  16.3%     Tobacco Abuse:  Tobacco Use History: Tried: Nicotine 14mg  patches in August 2024- failed due to stress at work Number of cigarettes per day 14-20  As of 10/14 visit, averaging 5 cigarettes per day now As of 11/11 visit, stopped smoking cessation and back to 14-20 cigarettes a day again- due to stress of work     Objective:  Lab Results  Component Value Date   HGBA1C 7.4 (A) 04/07/2023    Lab Results  Component Value Date   CREATININE 0.80 04/07/2023   BUN 12 04/07/2023   NA 139 04/07/2023   K 4.4 04/07/2023   CL 104 04/07/2023   CO2 21 04/07/2023    Lab Results  Component Value Date   CHOL 205 (H) 04/07/2023   HDL 55 04/07/2023   LDLCALC 130 (H) 04/07/2023   TRIG 113 04/07/2023   CHOLHDL 3.7 04/07/2023    Medications Reviewed Today     Reviewed by Pollie Friar, RPH (Pharmacist) on 06/05/23 at 1600  Med List Status: <None>   Medication Order Taking? Sig Documenting Provider Last Dose Status Informant  Accu-Chek FastClix Lancets MISC [id number] Yes Use as directed 4 times a day before meals and at bedtime Caleb Beers, FNP Taking Active   Accu-Chek Softclix Lancets lancets [id number] Yes Use as instructed [name], [name], FNP Taking Active   acetaminophen (TYLENOL) 325 MG tablet [id number] No Take 2 tablets (650 mg total) by mouth every 8 (eight) hours as needed for moderate pain.  Patient not taking: Reported on 04/07/2023   [name], MD Not Taking Active            Med Note [name] [facility]   Fri Jul 22, 2022  8:54 AM)  Prn   amLODipine (NORVASC) 5 MG tablet [id number] Yes Take 1 tablet (5 mg total) by mouth daily. Caleb Beers, FNP Taking Active   atorvastatin (LIPITOR) 20 MG tablet [id number] Yes Take 1 tablet (20 mg total) by mouth daily. Caleb Beers, FNP Taking Active   blood glucose meter kit and supplies KIT [id number] Yes Use as directed [name], MD Taking Active   Blood Pressure KIT [id number] No Please  check you blood pressure daily.  Patient not taking: Reported on 05/19/2023   Caleb Beers, FNP Not Taking Active   clindamycin (CLEOCIN T) 1 % external solution [id number] No Apply topically daily. to skin underarms  Patient not taking: Reported on 05/19/2023   Terri Piedra, DO Not Taking Active            Med Note [name], [facility]   Fri May 19, 2023 11:35 AM) Prn   gabapentin (NEURONTIN) 100 MG capsule [id number] No Take 1 capsule (100 mg total) by mouth 3 (three) times daily.  Patient not taking: Reported on 05/19/2023   Caleb Beers, FNP Not Taking Active            Med Note [name], [facility] Lies   Mon Apr 17, 2023  3:13 PM) Takes PRN  glucose blood (ACCU-CHEK GUIDE) test strip [id number] Yes Use as instructed Caleb Beers, FNP Taking Active   lisinopril (ZESTRIL) 5 MG tablet [id number] Yes Take 1 tablet (5 mg total) by mouth daily. Caleb Beers, FNP Taking Active   metFORMIN (GLUCOPHAGE-XR) 500 MG 24 hr tablet [id number] Yes Take 2 tablets with food daily. Caleb Beers, FNP Taking Active   nicotine (NICODERM CQ - DOSED IN MG/24 HOURS) 14 mg/24hr patch [id number] No Place 1 patch (14 mg total) onto the skin daily.  Patient not taking: Reported on 06/05/2023   Caleb Beers, FNP Not Taking Active   Probiotic Product (RISA-BID PROBIOTIC) TABS [id number] No Take 1 tablet by mouth 2 (two) times daily.  Patient not taking: Reported on 04/07/2023   [name], MD Not Taking Active               Assessment/Plan:   Diabetes: - Currently controlled - Reviewed long term cardiovascular and renal outcomes of uncontrolled blood sugar - Reviewed goal A1c, goal fasting, and goal 2 hour post prandial glucose - Reviewed dietary modifications including less carbs - Recommend to check glucose daily - Has opted to keep Metformin XR dosing at 500mg  2 tablets each morning at this time- will decide to increase dose depending on next A1c  reading    Hyperlipidemia/ASCVD Risk Reduction: - Currently uncontrolled.  - Reviewed long term complications of uncontrolled cholesterol - Recommend adherence to Atorvastatin 20mg  daily - Extensive discussion regarding foods that can affect both DM and HLD    Tobacco Abuse - Currently uncontrolled - Provided motivational interviewing to assess tobacco use and strategies for reduction - Provided information on 1 800 QUIT NOW support program **Has stopped nicotine patches due stress at work and re-started smoking about 1 PPD- willing to re-try in January as a New Years resolution   **Follow Up Plan:** - Follow-up in January after A1c and lipid panel are re-checked - Re-discuss smoking cessation goals at next visit - Of note, Atorvastatin 10mg  was accidentally sent to pharmacy by PCP- called [facility] and cancelled script - Also saw that Amlodipine 5mg  was due for a refill- sent to pharmacy     [name], PharmD  [facility] Phone Number: [phone]

## 2023-07-10 ENCOUNTER — Encounter: Payer: Self-pay | Admitting: Dermatology

## 2023-07-10 ENCOUNTER — Ambulatory Visit: Payer: No Typology Code available for payment source | Admitting: Dermatology

## 2023-07-10 VITALS — BP 133/89 | HR 109

## 2023-07-10 DIAGNOSIS — L732 Hidradenitis suppurativa: Secondary | ICD-10-CM | POA: Diagnosis not present

## 2023-07-10 DIAGNOSIS — L723 Sebaceous cyst: Secondary | ICD-10-CM

## 2023-07-10 MED ORDER — CLINDAMYCIN PHOSPHATE 1 % EX SOLN: Freq: Every day | CUTANEOUS | 2 refills | Status: AC

## 2023-07-10 MED ORDER — DOXYCYCLINE HYCLATE 100 MG PO TABS
100.0000 mg | ORAL_TABLET | Freq: Every day | ORAL | 4 refills | Status: AC
Start: 2023-07-10 — End: 2023-12-07

## 2023-07-10 MED ORDER — TRIAMCINOLONE ACETONIDE 40 MG/ML IJ SUSP
40.0000 mg | Freq: Once | INTRAMUSCULAR | Status: AC
Start: 2023-07-10 — End: 2023-07-10
  Administered 2023-07-10: 40 mg via INTRAMUSCULAR

## 2023-07-10 NOTE — Progress Notes (Signed)
   Follow-Up Visit   Subjective  [name] is a 37 y.o. male who presents for the following: HS  Patient present today for follow up visit for HS. Patient was last evaluated on 01/16/23. Patient has currently been using clindamycin prn.  Patient reports sxs are  improved but he just started having a flare 3 days ago . Patient denies medication changes.  The following portions of the chart were reviewed this encounter and updated as appropriate: medications, allergies, medical history  Review of Systems:  No other skin or systemic complaints except as noted in HPI or Assessment and Plan.  Objective  Well appearing patient in no apparent distress; mood and affect are within normal limits.  A focused examination was performed of the following areas: B/L Axilla  Relevant exam findings are noted in the Assessment and Plan.   Left Axilla (4), Right Axilla (4) Hurley Stage 3: multiple interconnected sinus tracts and abscesses throughout and entire area   Assessment & Plan   HIDRADENITIS SUPPURATIVA Exam: Hurley Stage 3: multiple interconnected sinus tracts and abscesses throughout and entire area  Flared  Hidradenitis Suppurativa is a chronic; persistent; non-curable, but treatable condition due to abnormal inflamed sweat glands in the body folds (axilla, inframammary, groin, medial thighs), causing recurrent painful draining cysts and scarring. It can be associated with severe scarring acne and cysts; also abscesses and scarring of scalp. The goal is control and prevention of flares, as it is not curable. Scars are permanent and can be thickened. Treatment may include daily use of topical medication and oral antibiotics.  Oral isotretinoin may also be helpful.  For some cases, Humira or Cosentyx (biologic injections) may be prescribed to decrease the inflammatory process and prevent flares.  When indicated, inflamed cysts may also be treated surgically.  Treatment Plan: INFLAMED  SEBACEOUS CYST (8) Left Axilla (4), Right Axilla (4) Procedure Note Intralesional Injection  Location: B/L Axillae  Informed Consent: Discussed risks (infection, pain, bleeding, bruising, thinning of the skin, loss of skin pigment, lack of resolution, and recurrence of lesion) and benefits of the procedure, as well as the alternatives. Informed consent was obtained. Preparation: The area was prepared a standard fashion.  Anesthesia: None  Procedure Details: An intralesional injection was performed with Kenalog 40 mg/cc. 2 cc in total were injected. NDC #: 16109-604-54 Exp: 12/24  Total number of injections: 8  Plan: The patient was instructed on post-op care. Recommend OTC analgesia as needed for pain.  Related Medications triamcinolone acetonide (KENALOG-40) injection 40 mg  HIDRADENITIS SUPPURATIVA   Related Medications clindamycin (CLEOCIN T) 1 % external solution Apply topically daily. to skin underarms doxycycline (VIBRA-TABS) 100 MG tablet Take 1 tablet (100 mg total) by mouth daily. TAKE WITH HEAVY MEAL AT THE FIRST SIGN OF FLARE  Return if symptoms worsen or fail to improve, for HS F/U.  Documentation: I have reviewed the above documentation for accuracy and completeness, and I agree with the above.  Stasia Cavalier, am acting as scribe for Langston Reusing, DO.  Langston Reusing, DO

## 2023-07-10 NOTE — Patient Instructions (Addendum)
Hello [name],  Thank you for visiting [place] today. We appreciate your commitment to improving your health and effectively managing your skin condition.  Here is a summary of the key instructions from today's consultation:  - Skin Care: Begin using CeraVe or Dial benzoyl peroxide wash to manage your skin condition. We have provided you with samples to try.  - Medications:   - Clindamycin 1% lotion: Continue applying topically during flare-ups until the condition resolves.   - Doxycycline: Take orally twice a day with food during flare-ups to prevent stomach upset. Continue this treatment for a week or until symptoms improve.  - Diet: Monitor and possibly adjust your diet to identify and avoid trigger foods that might be exacerbating your skin condition.  - Follow-Up: No specific follow-up appointment was scheduled today, but please feel free to reach out or visit as needed.  Additional Information: - We have refilled your prescriptions and updated your after-visit summary. Please ensure to activate your MyChart to access your medical notes and manage your appointments.  Wishing you a [employer] and a Happy New Year! We look forward to seeing you maintain your health.  Warm regards,  Dr. [facility]    Important Information   Due to recent changes in healthcare laws, you may see results of your pathology and/or laboratory studies on MyChart before the doctors have had a chance to review them. We understand that in some cases there may be results that are confusing or concerning to you. Please understand that not all results are received at the same time and often the doctors may need to interpret multiple results in order to provide you with the best plan of care or course of treatment. Therefore, we ask that you please give [place] 2 business days to thoroughly review all your results before contacting the office for clarification. Should we see a critical lab result, you will  be contacted sooner.     If You Need Anything After Your Visit   If you have any questions or concerns for your doctor, please call our main line at [phone]. If no one answers, please leave a voicemail as directed and we will return your call as soon as possible. Messages left after 4 pm will be answered the following business day.    You may also send [place] a message via MyChart. We typically respond to MyChart messages within 1-2 business days.  For prescription refills, please ask your pharmacy to contact our office. Our fax number is [phone].  If you have an urgent issue when the clinic is closed that cannot wait until the next business day, you can page your doctor at the number below.     Please note that while we do our best to be available for urgent issues outside of office hours, we are not available 24/7.    If you have an urgent issue and are unable to reach [place], you may choose to seek medical care at your doctor's office, retail clinic, urgent care center, or emergency room.   If you have a medical emergency, please immediately call 911 or go to the emergency department. In the event of inclement weather, please call our main line at [phone] for an update on the status of any delays or closures.  Dermatology Medication Tips: Please keep the boxes that topical medications come in in order to help keep track of the instructions about where and how to use these. Pharmacies typically print the medication instructions only  on the boxes and not directly on the medication tubes.   If your medication is too expensive, please contact our office at [phone] or send [place] a message through MyChart.    We are unable to tell what your co-pay for medications will be in advance as this is different depending on your insurance coverage. However, we may be able to find a substitute medication at lower cost or fill out paperwork to get insurance to cover a needed medication.    If a  prior authorization is required to get your medication covered by your insurance company, please allow [place] 1-2 business days to complete this process.   Drug prices often vary depending on where the prescription is filled and some pharmacies may offer cheaper prices.   The website www.goodrx.com contains coupons for medications through different pharmacies. The prices here do not account for what the cost may be with help from insurance (it may be cheaper with your insurance), but the website can give you the price if you did not use any insurance.  - You can print the associated coupon and take it with your prescription to the pharmacy.  - You may also stop by our office during regular business hours and pick up a GoodRx coupon card.  - If you need your prescription sent electronically to a different pharmacy, notify our office through [facility] or by phone at [phone]

## 2023-07-31 ENCOUNTER — Ambulatory Visit: Payer: Self-pay | Admitting: Nurse Practitioner

## 2023-08-14 ENCOUNTER — Other Ambulatory Visit: Payer: Self-pay | Admitting: Pharmacist

## 2023-08-14 NOTE — Progress Notes (Signed)
08/14/2023 Name: [name] MRN: [id number] DOB: 10-17-1985  Chief Complaint  Patient presents with   Diabetes    [name] is a 38 y.o. year old male who presented for a telephone visit.   They were referred to the pharmacist by their PCP for assistance in managing diabetes.   *Newly diagnosed with DM a year ago  Subjective:  Care Team: Primary Care Provider: Donell Beers, FNP ; Next Scheduled Visit: 08/23/23 Clinical Pharmacist: Marlowe Aschoff, PharmD  Medication Access/Adherence  Current Pharmacy:  [facility] [address], [place] - [address] [place] [address] Phone: [phone] Fax: [phone]   Patient reports affordability concerns with their medications: No  Patient reports access/transportation concerns to their pharmacy: No  Patient reports adherence concerns with their medications:  No     Diabetes:  Current medications: Metformin XR 500mg  2 tablets daily Medications tried in the past: Novolog (hasn't needed it), Humalog 75/25 (15 U twice a day- hasn't needed it)  Recent BG readings: FBG around 120-130 most mornings Using Accu-Chek Guide meter; testing 1 times daily   Patient denies hypoglycemic s/sx including dizziness, shakiness, sweating. Patient denies hyperglycemic symptoms including polyuria, polydipsia, polyphagia, nocturia, neuropathy, blurred vision.  Current meal patterns:  - Breakfast: Skips - Lunch/supper: Chicken, Healthy Choices meals Advised to avoid carbs (opt for whole wheat bread, carb balance wraps, and less rice if possible)  Current physical activity: None due to work- used to walk about 2 months ago  Current medication access support: Nurse, learning disability- high-deductible health plan   Hyperlipidemia/ASCVD Risk Reduction  Current lipid lowering medications: Atorvastatin 20mg  Medications tried in the past: None  Antiplatelet regimen: None  10-year ASCVD risk:  16.3%     Tobacco Abuse:  Tobacco Use History: Tried: Nicotine 14mg  patches in August 2024- failed due to stress at work Number of cigarettes per day 14-20  As of 10/14 visit, averaging 5 cigarettes per day now As of 11/11 visit, stopped smoking cessation and back to 14-20 cigarettes a day again- due to stress of work     Objective:  Lab Results  Component Value Date   HGBA1C 7.4 (A) 04/07/2023    Lab Results  Component Value Date   CREATININE 0.80 04/07/2023   BUN 12 04/07/2023   NA 139 04/07/2023   K 4.4 04/07/2023   CL 104 04/07/2023   CO2 21 04/07/2023    Lab Results  Component Value Date   CHOL 205 (H) 04/07/2023   HDL 55 04/07/2023   LDLCALC 130 (H) 04/07/2023   TRIG 113 04/07/2023   CHOLHDL 3.7 04/07/2023    Medications Reviewed Today   Medications were not reviewed in this encounter       Assessment/Plan:   Diabetes: - Currently controlled - Reviewed long term cardiovascular and renal outcomes of uncontrolled blood sugar - Reviewed goal A1c, goal fasting, and goal 2 hour post prandial glucose - Reviewed dietary modifications including less carbs - Recommend to check glucose daily - Has opted to keep Metformin XR dosing at 500mg  2 tablets each morning at this time- will decide to increase dose depending on next A1c reading    Hyperlipidemia/ASCVD Risk Reduction: - Currently uncontrolled.  - Reviewed long term complications of uncontrolled cholesterol - Recommend adherence to Atorvastatin 20mg  daily - Extensive discussion regarding foods that can affect both DM and HLD    Tobacco Abuse - Currently uncontrolled - Provided motivational interviewing to assess tobacco use  and strategies for reduction - Provided information on 1 800 QUIT NOW support program **Has been working on smoking cessation at the [employer] but still struggling  - Advised to do 14 mg patches and consider adding the 2mg  lozenges for cravings as needed  - Currently at 7-8  cigarettes per day  - Does well at work, but knows his trigger is smoking more with alcohol- aware he needs to minimize this trigger  Adherence dates from [employer]: Amlodipine: 12/14 90Ds Atorvastatin: 12/10 90DS Lisinopril: 12/30 90DS Metformin: 1/4 90DS   **Follow Up Plan:** - Follow-up on 09/14/23 after A1c and lipid panel are re-checked - Re-discuss smoking cessation goals at next visit    Marlowe Aschoff, PharmD [facility] Phone Number: [phone]

## 2023-08-23 ENCOUNTER — Encounter: Payer: Self-pay | Admitting: Nurse Practitioner

## 2023-08-23 ENCOUNTER — Ambulatory Visit (INDEPENDENT_AMBULATORY_CARE_PROVIDER_SITE_OTHER): Payer: No Typology Code available for payment source | Admitting: Nurse Practitioner

## 2023-08-23 VITALS — BP 148/77 | HR 87 | Temp 97.6°F | Wt 279.0 lb

## 2023-08-23 DIAGNOSIS — I10 Essential (primary) hypertension: Secondary | ICD-10-CM | POA: Diagnosis not present

## 2023-08-23 DIAGNOSIS — B353 Tinea pedis: Secondary | ICD-10-CM | POA: Diagnosis not present

## 2023-08-23 DIAGNOSIS — E785 Hyperlipidemia, unspecified: Secondary | ICD-10-CM

## 2023-08-23 DIAGNOSIS — E1165 Type 2 diabetes mellitus with hyperglycemia: Secondary | ICD-10-CM | POA: Diagnosis not present

## 2023-08-23 DIAGNOSIS — H1032 Unspecified acute conjunctivitis, left eye: Secondary | ICD-10-CM | POA: Diagnosis not present

## 2023-08-23 DIAGNOSIS — Z794 Long term (current) use of insulin: Secondary | ICD-10-CM

## 2023-08-23 DIAGNOSIS — Z720 Tobacco use: Secondary | ICD-10-CM

## 2023-08-23 LAB — POCT GLYCOSYLATED HEMOGLOBIN (HGB A1C): Hemoglobin A1C: 7.4 % — AB (ref 4.0–5.6)

## 2023-08-23 MED ORDER — METFORMIN HCL ER 500 MG PO TB24
ORAL_TABLET | ORAL | 1 refills | Status: DC
Start: 2023-08-23 — End: 2023-12-26

## 2023-08-23 MED ORDER — AMLODIPINE BESYLATE 5 MG PO TABS: 5.0000 mg | ORAL_TABLET | Freq: Every day | ORAL | 1 refills | Status: DC

## 2023-08-23 MED ORDER — POLYMYXIN B-TRIMETHOPRIM 10000-0.1 UNIT/ML-% OP SOLN: 1.0000 [drp] | Freq: Four times a day (QID) | OPHTHALMIC | 0 refills | Status: DC

## 2023-08-23 MED ORDER — CLOTRIMAZOLE 1 % EX OINT: TOPICAL_OINTMENT | CUTANEOUS | 0 refills | Status: DC

## 2023-08-23 MED ORDER — LISINOPRIL 10 MG PO TABS: 10.0000 mg | ORAL_TABLET | Freq: Every day | ORAL | 3 refills | Status: DC

## 2023-08-23 NOTE — Assessment & Plan Note (Signed)
Currently on amlodipine 5 mg daily, lisinopril 5 mg daily Blood pressure goal is less than 130/80 Increase lisinopril to 10 mg daily continue amlodipine 5 mg daily Encouraged to monitor blood pressure at home keep a log and bring to next visit in 4 weeks.  DASH diet and commitment to daily physical activity for a minimum of 30 minutes discussed and encouraged, as a part of hypertension management. The importance of attaining a healthy weight is also discussed.     08/23/2023   10:52 AM 08/23/2023   10:29 AM 07/10/2023    2:34 PM 05/19/2023   11:33 AM 04/07/2023   11:39 AM 04/07/2023   11:36 AM 01/16/2023   11:18 AM  BP/Weight  Systolic BP 148 150 133 133 131 146 139  Diastolic BP 77 81 89 79 72 84 86  Wt. (Lbs)  279  270  271 255  BMI  41.2 kg/m2  39.87 kg/m2  40.02 kg/m2 37.66 kg/m2

## 2023-08-23 NOTE — Assessment & Plan Note (Signed)
Smokes about 0.5 pack/day  Asked about quitting: confirms that he/she currently smokes cigarettes Advise to quit smoking: Educated about QUITTING to reduce the risk of cancer, cardio and cerebrovascular disease. Assess willingness: Unwilling to quit at this time, but is working on cutting back. Assist with counseling and pharmacotherapy: Counseled for 5 minutes and literature provided. Arrange for follow up: follow up in 4 months and continue to offer help.

## 2023-08-23 NOTE — Assessment & Plan Note (Signed)
-   Clotrimazole 1 % OINT; Apply to affected and surrounding area in between toes  twice daily for 2 week  Dispense: 56 g; Refill: 0

## 2023-08-23 NOTE — Assessment & Plan Note (Signed)
Chronic medical condition currently uncontrolled A1c is 7.4 Currently on metformin 1000 mg daily, we discussed increasing metformin to 1000 mg twice daily but still patient refused.  Stated that his diet can be better. he plans on working on his diet also plans to start exercising, wants to see how these will help first before making changes to his medication Patient counseled on low-carb diet, encouraged to engage in regular moderate to vigorous exercises at least 150 minutes weekly Stated that he has upcoming appointment for diabetic eye exam patient encouraged to keep that appointment Follow-up in 3 months

## 2023-08-23 NOTE — Progress Notes (Signed)
Established Patient Office Visit  Subjective:  Patient ID: [name], male    DOB: [birth year]  Age: 38 y.o. MRN: [id number]  CC:  Chief Complaint  Patient presents with   Hyperlipidemia    HPI [name] is a 38 y.o. 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  Patient presents for follow-up for his chronic medical conditions Stated that he has been taking all medications daily as ordered Patient denies any adverse reactions to current medications Please see assessment and plan section for full HPI      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   Allergy    Asthma    Breast abscess 06/28/2022   Chest wall abscess 08/10/2022   Diabetes mellitus without complication (HCC)    Hyperlipidemia    Hypertension    Intertrigo 08/10/2022   Obesity    Tobacco use disorder    Vitamin D deficiency     Past Surgical History:  Procedure Laterality Date   INCISION AND DRAINAGE ABSCESS Left 07/01/2022   Procedure: INCISION AND DRAINAGE ABSCESS;  Surgeon: Griselda Miner, MD;  Location: [facility] OR;  Service: General;  Laterality: Left;   IRRIGATION AND DEBRIDEMENT ABSCESS Left 07/03/2022   Procedure: IRRIGATION AND DEBRIDEMENT CHEST WALL / Breast ABSCESS;  Surgeon: Violeta Gelinas, MD;  Location: [facility] OR;  Service: General;  Laterality: Left;    Family History  Problem Relation Age of Onset   Diabetes Mother    Breast cancer Maternal Grandmother    Diabetes Paternal Grandmother    Diabetes Paternal Grandfather    Colon cancer Neg Hx     Social History   Socioeconomic History   Marital status: Single    Spouse name: Not on file   Number of children: 1   Years of education: Not on file    Highest education level: Not on file  Occupational History   Not on file  Tobacco Use   Smoking status: Every Day    Current packs/day: 0.30    Types: Cigarettes    Passive exposure: Never   Smokeless tobacco: Never  Substance and Sexual Activity   Alcohol use: Yes    Alcohol/week: 13.0 standard drinks of alcohol    Types: 9 Cans of beer, 4 Shots of liquor per week    Comment: occasional   Drug use: Not Currently    Types: Marijuana   Sexual activity: Yes  Other Topics Concern   Not on file  Social History Narrative   Lives with his mother.    Social Drivers of Corporate investment banker Strain: Not on file  Food Insecurity: No Food Insecurity (06/28/2022)   Hunger Vital Sign    Worried About Running Out of Food in the Last Year: Never true    Ran Out of Food in the Last Year: Never true  Transportation Needs: No Transportation Needs (06/28/2022)   PRAPARE - Administrator, Civil Service (Medical): No    Lack of Transportation (Non-Medical): No  Physical Activity: Not on file  Stress: Not on file  Social Connections: Not on file  Intimate Partner Violence: Not At Risk (06/28/2022)   Humiliation, Afraid, Rape, and Kick questionnaire    Fear of Current or Ex-Partner: No    Emotionally Abused: No  Physically Abused: No    Sexually Abused: No    Outpatient Medications Prior to Visit  Medication Sig Dispense Refill   Accu-Chek FastClix Lancets MISC Use as directed 4 times a day before meals and at bedtime 102 each 0   Accu-Chek Softclix Lancets lancets Use as instructed 100 each 12   amLODipine (NORVASC) 5 MG tablet Take 1 tablet (5 mg total) by mouth daily. 90 tablet 1   atorvastatin (LIPITOR) 20 MG tablet Take 1 tablet (20 mg total) by mouth daily. 90 tablet 1   blood glucose meter kit and supplies KIT Use as directed 1 each 0   clindamycin (CLEOCIN T) 1 % external solution Apply topically daily. to skin underarms 60 mL 2   doxycycline (VIBRA-TABS) 100  MG tablet Take 1 tablet (100 mg total) by mouth daily. TAKE WITH HEAVY MEAL AT THE FIRST SIGN OF FLARE 30 tablet 4   metFORMIN (GLUCOPHAGE-XR) 500 MG 24 hr tablet Take 2 tablets with food daily. 180 tablet 1   nicotine (NICODERM CQ - DOSED IN MG/24 HOURS) 14 mg/24hr patch Place 1 patch (14 mg total) onto the skin daily. 14 patch 1   lisinopril (ZESTRIL) 5 MG tablet Take 1 tablet (5 mg total) by mouth daily. 90 tablet 3   acetaminophen (TYLENOL) 325 MG tablet Take 2 tablets (650 mg total) by mouth every 8 (eight) hours as needed for moderate pain. (Patient not taking: Reported on 08/23/2023) 20 tablet 0   Blood Pressure KIT Please check you blood pressure daily. (Patient not taking: Reported on 08/23/2023) 1 kit 0   gabapentin (NEURONTIN) 100 MG capsule Take 1 capsule (100 mg total) by mouth 3 (three) times daily. (Patient not taking: Reported on 08/23/2023) 90 capsule 3   glucose blood (ACCU-CHEK GUIDE) test strip Use as instructed (Patient not taking: Reported on 08/23/2023) 100 strip 2   Probiotic Product (RISA-BID PROBIOTIC) TABS Take 1 tablet by mouth 2 (two) times daily. (Patient not taking: Reported on 08/23/2023) 60 tablet 2   No facility-administered medications prior to visit.    Allergies  Allergen Reactions   Bee Pollen    Peach [Prunus Persica]     Pt reports swelling and lip tingling as reaction    ROS Review of Systems    Objective:    Physical Exam Vitals and nursing note reviewed.  Constitutional:      General: He is not in acute distress.    Appearance: Normal appearance. He is obese. He is not ill-appearing, toxic-appearing or diaphoretic.  HENT:     Mouth/Throat:     Mouth: Mucous membranes are moist.     Pharynx: Oropharynx is clear. No oropharyngeal exudate or posterior oropharyngeal erythema.  Eyes:     General: No scleral icterus.       Right eye: No discharge.        Left eye: No discharge.     Extraocular Movements: Extraocular movements intact.      Conjunctiva/sclera: Conjunctivae normal.  Cardiovascular:     Rate and Rhythm: Normal rate and regular rhythm.     Pulses: Normal pulses.     Heart sounds: Normal heart sounds. No murmur heard.    No friction rub. No gallop.  Pulmonary:     Effort: Pulmonary effort is normal. No respiratory distress.     Breath sounds: Normal breath sounds. No stridor. No wheezing, rhonchi or rales.  Chest:     Chest wall: No tenderness.  Abdominal:     General:  There is no distension.     Palpations: Abdomen is soft.     Tenderness: There is no abdominal tenderness. There is no right CVA tenderness, left CVA tenderness or guarding.  Musculoskeletal:        General: No swelling, tenderness, deformity or signs of injury.     Right lower leg: No edema.     Left lower leg: No edema.  Skin:    General: Skin is warm and dry.     Capillary Refill: Capillary refill takes less than 2 seconds.     Coloration: Skin is not jaundiced or pale.     Findings: No bruising or erythema.     Comments: Lesions consistent with tenia infection noted between the toes.  No swelling redness or drainage noted  Neurological:     Mental Status: He is alert and oriented to person, place, and time.     Motor: No weakness.     Coordination: Coordination normal.     Gait: Gait normal.  Psychiatric:        Mood and Affect: Mood normal.        Behavior: Behavior normal.        Thought Content: Thought content normal.        Judgment: Judgment normal.     BP (!) 148/77   Pulse 87   Temp 97.6 F (36.4 C)   Wt 279 lb (126.6 kg)   SpO2 100%   BMI 41.20 kg/m  Wt Readings from Last 3 Encounters:  08/23/23 279 lb (126.6 kg)  05/19/23 270 lb (122.5 kg)  04/07/23 271 lb (122.9 kg)    Lab Results  Component Value Date   TSH 3.788 06/24/2022   Lab Results  Component Value Date   WBC 7.8 01/16/2023   HGB 14.6 01/16/2023   HCT 42.2 01/16/2023   MCV 80.7 01/16/2023   PLT 347 01/16/2023   Lab Results  Component Value  Date   NA 139 04/07/2023   K 4.4 04/07/2023   CO2 21 04/07/2023   GLUCOSE 145 (H) 04/07/2023   BUN 12 04/07/2023   CREATININE 0.80 04/07/2023   BILITOT 0.8 01/16/2023   ALKPHOS 130 (H) 07/22/2022   AST 20 01/16/2023   ALT 31 01/16/2023   PROT 7.5 01/16/2023   ALBUMIN 3.6 (L) 07/22/2022   CALCIUM 9.5 04/07/2023   ANIONGAP 7 07/11/2022   EGFR 115 04/07/2023   Lab Results  Component Value Date   CHOL 205 (H) 04/07/2023   Lab Results  Component Value Date   HDL 55 04/07/2023   Lab Results  Component Value Date   LDLCALC 130 (H) 04/07/2023   Lab Results  Component Value Date   TRIG 113 04/07/2023   Lab Results  Component Value Date   CHOLHDL 3.7 04/07/2023   Lab Results  Component Value Date   HGBA1C 7.4 (A) 08/23/2023      Assessment & Plan:   Problem List Items Addressed This Visit       Cardiovascular and Mediastinum   Primary hypertension   Currently on amlodipine 5 mg daily, lisinopril 5 mg daily Blood pressure goal is less than 130/80 Increase lisinopril to 10 mg daily continue amlodipine 5 mg daily Encouraged to monitor blood pressure at home keep a log and bring to next visit in 4 weeks.  DASH diet and commitment to daily physical activity for a minimum of 30 minutes discussed and encouraged, as a part of hypertension management. The importance of attaining a healthy  weight is also discussed.     08/23/2023   10:52 AM 08/23/2023   10:29 AM 07/10/2023    2:34 PM 05/19/2023   11:33 AM 04/07/2023   11:39 AM 04/07/2023   11:36 AM 01/16/2023   11:18 AM  BP/Weight  Systolic BP 148 150 133 133 131 146 139  Diastolic BP 77 81 89 79 72 84 86  Wt. (Lbs)  279  270  271 255  BMI  41.2 kg/m2  39.87 kg/m2  40.02 kg/m2 37.66 kg/m2           Relevant Medications   lisinopril (ZESTRIL) 10 MG tablet   Other Relevant Orders   CMP14+EGFR     Endocrine   Type 2 diabetes mellitus with hyperglycemia (HCC) - Primary   Chronic medical condition currently  uncontrolled A1c is 7.4 Currently on metformin 1000 mg daily, we discussed increasing metformin to 1000 mg twice daily but still patient refused.  Stated that his diet can be better. he plans on working on his diet also plans to start exercising, wants to see how these will help first before making changes to his medication Patient counseled on low-carb diet, encouraged to engage in regular moderate to vigorous exercises at least 150 minutes weekly Stated that he has upcoming appointment for diabetic eye exam patient encouraged to keep that appointment Follow-up in 3 months      Relevant Medications   lisinopril (ZESTRIL) 10 MG tablet   Other Relevant Orders   Microalbumin/Creatinine Ratio, Urine   POCT glycosylated hemoglobin (Hb A1C) (Completed)     Musculoskeletal and Integument   Tinea pedis of both feet    - Clotrimazole 1 % OINT; Apply to affected and surrounding area in between toes  twice daily for 2 week  Dispense: 56 g; Refill: 0       Relevant Medications   Clotrimazole 1 % OINT     Other   Tobacco abuse   Smokes about 0.5 pack/day  Asked about quitting: confirms that he/she currently smokes cigarettes Advise to quit smoking: Educated about QUITTING to reduce the risk of cancer, cardio and cerebrovascular disease. Assess willingness: Unwilling to quit at this time, but is working on cutting back. Assist with counseling and pharmacotherapy: Counseled for 5 minutes and literature provided. Arrange for follow up: follow up in 4 months and continue to offer help.       Dyslipidemia, goal LDL below 70   Lab Results  Component Value Date   CHOL 205 (H) 04/07/2023   HDL 55 04/07/2023   LDLCALC 130 (H) 04/07/2023   TRIG 113 04/07/2023   CHOLHDL 3.7 04/07/2023    On atorvastatin 20 mg daily Checking lipid panel LDL goal is less than 70 Avoid fatty fried foods, lose weight      Relevant Medications   lisinopril (ZESTRIL) 10 MG tablet   Other Relevant Orders    Lipid panel   Acute bacterial conjunctivitis of left eye   Patient complains of left eye discharge,eye discomfort of the left eye for about 8 months.  States that he sometimes has to rub his eyes due to the discomfort. Ordered - trimethoprim-polymyxin b (POLYTRIM) ophthalmic solution; Place 1 drop into the left eye every 6 (six) hours.  Dispense: 10 mL; Refill: 0       Relevant Medications   Clotrimazole 1 % OINT   trimethoprim-polymyxin b (POLYTRIM) ophthalmic solution    Meds ordered this encounter  Medications   lisinopril (ZESTRIL) 10 MG tablet  Sig: Take 1 tablet (10 mg total) by mouth daily.    Dispense:  90 tablet    Refill:  3   Clotrimazole 1 % OINT    Sig: Apply to affected and surrounding area in between toes  twice daily for 2 week    Dispense:  56 g    Refill:  0   trimethoprim-polymyxin b (POLYTRIM) ophthalmic solution    Sig: Place 1 drop into the left eye every 6 (six) hours.    Dispense:  10 mL    Refill:  0    Follow-up: Return in about 4 weeks (around 09/20/2023) for HTN.    Donell Beers, FNP

## 2023-08-23 NOTE — Patient Instructions (Addendum)
1. Acute bacterial conjunctivitis of left eye (Primary)  - trimethoprim-polymyxin b (POLYTRIM) ophthalmic solution; Place 1 drop into the left eye every 6 (six) hours.  Dispense: 10 mL; Refill: 0   . Use for one week   2. Tinea pedis of both feet  - Clotrimazole 1 % OINT; Apply to affected and surrounding area in between toes  twice daily for 2 week  Dispense: 56 g; Refill: 0  3. Primary hypertension  - CMP14+EGFR - lisinopril (ZESTRIL) 10 MG tablet; Take 1 tablet (10 mg total) by mouth daily.  Dispense: 90 tablet; Refill: 3  4. Dyslipidemia, goal LDL below 70  - Lipid panel  5. Type 2 diabetes mellitus with hyperglycemia, without long-term current use of insulin (HCC)  - Microalbumin/Creatinine Ratio, Urine - POCT glycosylated hemoglobin (Hb A1C)   Around 3 times per week, check your blood pressure 2 times per day. once in the morning and once in the evening. The readings should be at least one minute apart. Write down these values and bring them to your next nurse visit/appointment.  When you check your BP, make sure you have been doing something calm/relaxing 5 minutes prior to checking. Both feet should be flat on the floor and you should be sitting. Use your left arm and make sure it is in a relaxed position (on a table), and that the cuff is at the approximate level/height of your heart.       It is important that you exercise regularly at least 30 minutes 5 times a week as tolerated  Think about what you will eat, plan ahead. Choose " clean, green, fresh or frozen" over canned, processed or packaged foods which are more sugary, salty and fatty. 70 to 75% of food eaten should be vegetables and fruit. Three meals at set times with snacks allowed between meals, but they must be fruit or vegetables. Aim to eat over a 12 hour period , example 7 am to 7 pm, and STOP after  your last meal of the day. Drink water,generally about 64 ounces per day, no other drink is as healthy.  Fruit juice is best enjoyed in a healthy way, by EATING the fruit.  Thanks for choosing [facility] we consider it a privelige to serve you.

## 2023-08-23 NOTE — Assessment & Plan Note (Addendum)
Lab Results  Component Value Date   CHOL 205 (H) 04/07/2023   HDL 55 04/07/2023   LDLCALC 130 (H) 04/07/2023   TRIG 113 04/07/2023   CHOLHDL 3.7 04/07/2023    On atorvastatin 20 mg daily Checking lipid panel LDL goal is less than 70 Avoid fatty fried foods, lose weight

## 2023-08-23 NOTE — Assessment & Plan Note (Signed)
Patient complains of left eye discharge,eye discomfort of the left eye for about 8 months.  States that he sometimes has to rub his eyes due to the discomfort. Ordered - trimethoprim-polymyxin b (POLYTRIM) ophthalmic solution; Place 1 drop into the left eye every 6 (six) hours.  Dispense: 10 mL; Refill: 0

## 2023-08-24 LAB — LIPID PANEL
Chol/HDL Ratio: 3.6 {ratio} (ref 0.0–5.0)
Cholesterol, Total: 189 mg/dL (ref 100–199)
HDL: 53 mg/dL (ref >39)
LDL Chol Calc (NIH): 114 mg/dL — ABNORMAL HIGH (ref 0–99)
Triglycerides: 125 mg/dL (ref 0–149)
VLDL Cholesterol Cal: 22 mg/dL (ref 5–40)

## 2023-08-24 LAB — MICROALBUMIN / CREATININE URINE RATIO
Creatinine, Urine: 84.8 mg/dL
Microalb/Creat Ratio: 1519 mg/g{creat} — ABNORMAL HIGH (ref 0–29)
Microalbumin, Urine: 1288.1 ug/mL

## 2023-08-24 LAB — CMP14+EGFR
ALT: 24 [IU]/L (ref 0–44)
AST: 19 [IU]/L (ref 0–40)
Albumin: 3.9 g/dL — ABNORMAL LOW (ref 4.1–5.1)
Alkaline Phosphatase: 99 [IU]/L (ref 44–121)
BUN/Creatinine Ratio: 15 (ref 9–20)
BUN: 13 mg/dL (ref 6–20)
Bilirubin Total: 0.4 mg/dL (ref 0.0–1.2)
CO2: 26 mmol/L (ref 20–29)
Calcium: 9.6 mg/dL (ref 8.7–10.2)
Chloride: 101 mmol/L (ref 96–106)
Creatinine, Ser: 0.86 mg/dL (ref 0.76–1.27)
Globulin, Total: 3.6 g/dL (ref 1.5–4.5)
Glucose: 121 mg/dL — ABNORMAL HIGH (ref 70–99)
Potassium: 4.5 mmol/L (ref 3.5–5.2)
Sodium: 139 mmol/L (ref 134–144)
Total Protein: 7.5 g/dL (ref 6.0–8.5)
eGFR: 113 mL/min/{1.73_m2} (ref >59)

## 2023-08-25 ENCOUNTER — Other Ambulatory Visit: Payer: Self-pay | Admitting: Nurse Practitioner

## 2023-08-25 DIAGNOSIS — E1129 Type 2 diabetes mellitus with other diabetic kidney complication: Secondary | ICD-10-CM

## 2023-08-25 MED ORDER — ATORVASTATIN CALCIUM 40 MG PO TABS: 40.0000 mg | ORAL_TABLET | Freq: Every day | ORAL | 1 refills | Status: DC

## 2023-09-14 ENCOUNTER — Other Ambulatory Visit: Payer: Self-pay | Admitting: Pharmacist

## 2023-09-14 ENCOUNTER — Telehealth: Payer: Self-pay | Admitting: Pharmacist

## 2023-09-14 NOTE — Progress Notes (Signed)
 09/14/2023 Name: [name] MRN: [id number] DOB: November 05, 1985  Chief Complaint  Patient presents with   Diabetes    [name] is a 38 y.o. year old male who presented for a telephone visit.   They were referred to the pharmacist by their PCP for assistance in managing diabetes.   *Newly diagnosed with DM a year ago  Subjective:  Care Team: Primary Care Provider: Donell Beers, FNP ; Next Scheduled Visit: 08/23/23 Clinical Pharmacist: Marlowe Aschoff, PharmD  Medication Access/Adherence  Current Pharmacy:  [facility] # [address] [place] - [address] [place] [address] Phone: [phone] Fax: [phone]   Patient reports affordability concerns with their medications: No  Patient reports access/transportation concerns to their pharmacy: No  Patient reports adherence concerns with their medications:  No     Diabetes:  Current medications: Metformin XR 500mg  2 tablets daily Medications tried in the past: Novolog (hasn't needed it), Humalog 75/25 (15 U twice a day- hasn't needed it)  Recent BG readings: FBG around 120-130 most mornings Using Accu-Chek Guide meter; testing 1 times daily   Patient denies hypoglycemic s/sx including dizziness, shakiness, sweating. Patient denies hyperglycemic symptoms including polyuria, polydipsia, polyphagia, nocturia, neuropathy, blurred vision.  Current meal patterns:  - Breakfast: Skips - Lunch/supper: Chicken, Healthy Choices meals Advised to avoid carbs (opt for whole wheat bread, carb balance wraps, and less rice if possible)  Current physical activity: None due to work- used to walk about 2 months ago  Current medication access support: Nurse, learning disability- high-deductible health plan   Hyperlipidemia/ASCVD Risk Reduction  Current lipid lowering medications: Atorvastatin 20mg  Medications tried in the past: None  Antiplatelet regimen: None  10-year ASCVD risk:  16.3%     Tobacco Abuse:  Tobacco Use History: Tried: Nicotine 14mg  patches in August 2024- failed due to stress at work Number of cigarettes per day 14-20  As of 10/14 visit, averaging 5 cigarettes per day now As of 11/11 visit, stopped smoking cessation and back to 14-20 cigarettes a day again- due to stress of work As of 09/14/23 visit, patient quit smoking for the past 3 weeks     Objective:  Lab Results  Component Value Date   HGBA1C 7.4 (A) 08/23/2023    Lab Results  Component Value Date   CREATININE 0.86 08/23/2023   BUN 13 08/23/2023   NA 139 08/23/2023   K 4.5 08/23/2023   CL 101 08/23/2023   CO2 26 08/23/2023    Lab Results  Component Value Date   CHOL 189 08/23/2023   HDL 53 08/23/2023   LDLCALC 114 (H) 08/23/2023   TRIG 125 08/23/2023   CHOLHDL 3.6 08/23/2023    Medications Reviewed Today   Medications were not reviewed in this encounter       Assessment/Plan:   Diabetes: - Currently controlled - Reviewed long term cardiovascular and renal outcomes of uncontrolled blood sugar - Reviewed goal A1c, goal fasting, and goal 2 hour post prandial glucose - Reviewed dietary modifications including less carbs - Recommend to check glucose daily - Has opted to keep Metformin XR dosing at 500mg  2 tablets each morning at this time- will decide to increase dose depending on next A1c reading  - Concerns about dehydration with GI issues from use - Avoiding carbs completely since A1c results returned- removed rice and lasagna - Exercising three days a week now   Hyperlipidemia/ASCVD Risk Reduction: - Currently uncontrolled.  - Reviewed long term  complications of uncontrolled cholesterol - Recommend adherence to Atorvastatin 40mg  daily- currently taking the 20mg  daily - Extensive discussion regarding foods that can affect both DM and HLD   Tobacco Abuse - Currently controlled - Reports quitting by cold [place] for 3 weeks now   Adherence dates from  [facility]: Amlodipine: 12/14 90Ds Atorvastatin: 12/10 90DS Lisinopril: 12/30 90DS Metformin: 1/4 90DS   **Follow Up Plan:** - Follow-up on 10/19/23 after increasing statin and working on diet/exercise some more - Advised to double up on Atorvastatin by taking 2 tablets daily- then start Atorvastatin 40mg  daily with new script - Explained meaning of AUCR lab and concerns regarding it - Confirms taking Lisinopril 10mg  daily, which should help AUCR - Patient quit smoking for 3 weeks now    Marlowe Aschoff, PharmD [facility] Phone Number: [phone]

## 2023-09-14 NOTE — Progress Notes (Signed)
   09/14/2023  Patient ID: [name], male   DOB: 09/19/1985, 38 y.o.   MRN: [id number]  Tried to call patient for our follow-up call. Unable to leave voicemail on phone. Will try again in 1 week.   Marlowe Aschoff, PharmD [facility] Phone Number: [phone]

## 2023-09-20 ENCOUNTER — Encounter: Payer: Self-pay | Admitting: Nurse Practitioner

## 2023-09-20 ENCOUNTER — Ambulatory Visit (INDEPENDENT_AMBULATORY_CARE_PROVIDER_SITE_OTHER): Payer: No Typology Code available for payment source | Admitting: Nurse Practitioner

## 2023-09-20 VITALS — BP 120/69 | HR 92 | Temp 97.5°F | Wt 274.0 lb

## 2023-09-20 DIAGNOSIS — B353 Tinea pedis: Secondary | ICD-10-CM | POA: Diagnosis not present

## 2023-09-20 DIAGNOSIS — E785 Hyperlipidemia, unspecified: Secondary | ICD-10-CM

## 2023-09-20 DIAGNOSIS — I10 Essential (primary) hypertension: Secondary | ICD-10-CM | POA: Diagnosis not present

## 2023-09-20 DIAGNOSIS — Z720 Tobacco use: Secondary | ICD-10-CM

## 2023-09-20 DIAGNOSIS — E1165 Type 2 diabetes mellitus with hyperglycemia: Secondary | ICD-10-CM

## 2023-09-20 MED ORDER — CLOTRIMAZOLE 1 % EX OINT: TOPICAL_OINTMENT | CUTANEOUS | 0 refills | Status: AC

## 2023-09-20 NOTE — Assessment & Plan Note (Signed)
 He has quit smoking, patient congratulated on his efforts at smoking cessation.  Encouraged to continue to abstain from smoking cigarettes.

## 2023-09-20 NOTE — Progress Notes (Signed)
 Established Patient Office Visit  Subjective:  Patient ID: [name], male    DOB: [birth year]  Age: 38 y.o. MRN: [id number]  CC:  Chief Complaint  Patient presents with   Diabetes   Hypertension    Increase b/p med cand chole. Med     HPI [name] is a 38 y.o. 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  Patient presents for follow-up for hypertension  Hypertension.  Currently on lisinopril 10 mg daily, amlodipine 5 mg daily, stated that he has quit smoking cigarettes and has been exercising more.  Currently denies chest pain, shortness of breath, edema      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   Allergy    Asthma    Breast abscess 06/28/2022   Chest wall abscess 08/10/2022   Diabetes mellitus without complication (HCC)    Hyperlipidemia    Hypertension    Intertrigo 08/10/2022   Obesity    Tobacco use disorder    Vitamin D deficiency     Past Surgical History:  Procedure Laterality Date   INCISION AND DRAINAGE ABSCESS Left 07/01/2022   Procedure: INCISION AND DRAINAGE ABSCESS;  Surgeon: Griselda Miner, MD;  Location: [facility] OR;  Service: General;  Laterality: Left;   IRRIGATION AND DEBRIDEMENT ABSCESS Left 07/03/2022   Procedure: IRRIGATION AND DEBRIDEMENT CHEST WALL / Breast ABSCESS;  Surgeon: Violeta Gelinas, MD;  Location: [facility] OR;  Service: General;  Laterality: Left;    Family History  Problem Relation Age of Onset   Diabetes Mother    Breast cancer Maternal Grandmother    Diabetes Paternal Grandmother    Diabetes Paternal Grandfather    Colon cancer Neg Hx     Social History   Socioeconomic History   Marital status: Single    Spouse name: Not on file    Number of children: 1   Years of education: Not on file   Highest education level: Not on file  Occupational History   Not on file  Tobacco Use   Smoking status: Former    Current packs/day: 0.00    Types: Cigarettes    Quit date: 08/2023    Years since quitting: 0.0    Passive exposure: Never   Smokeless tobacco: Never  Substance and Sexual Activity   Alcohol use: Yes    Alcohol/week: 13.0 standard drinks of alcohol    Types: 9 Cans of beer, 4 Shots of liquor per week    Comment: occasional   Drug use: Not Currently    Types: Marijuana   Sexual activity: Yes  Other Topics Concern   Not on file  Social History Narrative   Lives with his mother.    Social Drivers of Corporate investment banker Strain: Not on file  Food Insecurity: No Food Insecurity (06/28/2022)   Hunger Vital Sign    Worried About Running Out of Food in the Last Year: Never true    Ran Out of Food in the Last Year: Never true  Transportation Needs: No Transportation Needs (06/28/2022)   PRAPARE - Administrator, Civil Service (Medical): No    Lack of Transportation (Non-Medical): No  Physical Activity: Not on file  Stress: Not on file  Social Connections: Not on file  Intimate Partner Violence:  Not At Risk (06/28/2022)   Humiliation, Afraid, Rape, and Kick questionnaire    Fear of Current or Ex-Partner: No    Emotionally Abused: No    Physically Abused: No    Sexually Abused: No    Outpatient Medications Prior to Visit  Medication Sig Dispense Refill   Accu-Chek FastClix Lancets MISC Use as directed 4 times a day before meals and at bedtime 102 each 0   Accu-Chek Softclix Lancets lancets Use as instructed 100 each 12   amLODipine (NORVASC) 5 MG tablet Take 1 tablet (5 mg total) by mouth daily. 90 tablet 1   atorvastatin (LIPITOR) 40 MG tablet Take 1 tablet (40 mg total) by mouth daily. 90 tablet 1   blood glucose meter kit and supplies KIT Use as directed 1 each 0   clindamycin (CLEOCIN  T) 1 % external solution Apply topically daily. to skin underarms 60 mL 2   lisinopril (ZESTRIL) 10 MG tablet Take 1 tablet (10 mg total) by mouth daily. 90 tablet 3   metFORMIN (GLUCOPHAGE-XR) 500 MG 24 hr tablet Take 2 tablets with food daily. 180 tablet 1   trimethoprim-polymyxin b (POLYTRIM) ophthalmic solution Place 1 drop into the left eye every 6 (six) hours. 10 mL 0   acetaminophen (TYLENOL) 325 MG tablet Take 2 tablets (650 mg total) by mouth every 8 (eight) hours as needed for moderate pain. (Patient not taking: Reported on 09/20/2023) 20 tablet 0   Blood Pressure KIT Please check you blood pressure daily. (Patient not taking: Reported on 09/20/2023) 1 kit 0   doxycycline (VIBRA-TABS) 100 MG tablet Take 1 tablet (100 mg total) by mouth daily. TAKE WITH HEAVY MEAL AT THE FIRST SIGN OF FLARE (Patient not taking: Reported on 09/20/2023) 30 tablet 4   gabapentin (NEURONTIN) 100 MG capsule Take 1 capsule (100 mg total) by mouth 3 (three) times daily. (Patient not taking: Reported on 09/20/2023) 90 capsule 3   glucose blood (ACCU-CHEK GUIDE) test strip Use as instructed (Patient not taking: Reported on 09/20/2023) 100 strip 2   nicotine (NICODERM CQ - DOSED IN MG/24 HOURS) 14 mg/24hr patch Place 1 patch (14 mg total) onto the skin daily. (Patient not taking: Reported on 09/20/2023) 14 patch 1   Probiotic Product (RISA-BID PROBIOTIC) TABS Take 1 tablet by mouth 2 (two) times daily. (Patient not taking: Reported on 09/20/2023) 60 tablet 2   Clotrimazole 1 % OINT Apply to affected and surrounding area in between toes  twice daily for 2 week (Patient not taking: Reported on 09/20/2023) 56 g 0   No facility-administered medications prior to visit.    Allergies  Allergen Reactions   Bee Pollen    Peach [Prunus Persica]     Pt reports swelling and lip tingling as reaction    ROS Review of Systems  Constitutional:  Negative for appetite change, chills, fatigue and fever.  HENT:  Negative for  congestion, postnasal drip, rhinorrhea and sneezing.   Respiratory:  Negative for cough, shortness of breath and wheezing.   Cardiovascular:  Negative for chest pain, palpitations and leg swelling.  Gastrointestinal:  Negative for abdominal pain, constipation, nausea and vomiting.  Genitourinary:  Negative for difficulty urinating, dysuria, flank pain and frequency.  Musculoskeletal:  Negative for arthralgias, back pain, joint swelling and myalgias.  Skin:  Negative for color change, pallor, rash and wound.  Neurological:  Negative for dizziness, facial asymmetry, weakness, numbness and headaches.  Psychiatric/Behavioral:  Negative for behavioral problems, confusion, self-injury and suicidal ideas.  Objective:    Physical Exam Vitals and nursing note reviewed.  Constitutional:      General: He is not in acute distress.    Appearance: Normal appearance. He is obese. He is not ill-appearing, toxic-appearing or diaphoretic.  HENT:     Mouth/Throat:     Mouth: Mucous membranes are moist.     Pharynx: Oropharynx is clear. No oropharyngeal exudate or posterior oropharyngeal erythema.  Eyes:     General: No scleral icterus.       Right eye: No discharge.        Left eye: No discharge.     Extraocular Movements: Extraocular movements intact.     Conjunctiva/sclera: Conjunctivae normal.  Cardiovascular:     Rate and Rhythm: Normal rate and regular rhythm.     Pulses: Normal pulses.     Heart sounds: Normal heart sounds. No murmur heard.    No friction rub. No gallop.  Pulmonary:     Effort: Pulmonary effort is normal. No respiratory distress.     Breath sounds: Normal breath sounds. No stridor. No wheezing, rhonchi or rales.  Chest:     Chest wall: No tenderness.  Abdominal:     General: There is no distension.     Palpations: Abdomen is soft.     Tenderness: There is no abdominal tenderness. There is no right CVA tenderness, left CVA tenderness or guarding.  Musculoskeletal:         General: No swelling, tenderness, deformity or signs of injury.     Right lower leg: No edema.     Left lower leg: No edema.  Skin:    General: Skin is warm and dry.     Capillary Refill: Capillary refill takes less than 2 seconds.     Coloration: Skin is not jaundiced or pale.     Findings: No bruising, erythema or lesion.  Neurological:     Mental Status: He is alert and oriented to person, place, and time.     Motor: No weakness.     Coordination: Coordination normal.     Gait: Gait normal.  Psychiatric:        Mood and Affect: Mood normal.        Behavior: Behavior normal.        Thought Content: Thought content normal.        Judgment: Judgment normal.     BP 120/69   Pulse 92   Temp (!) 97.5 F (36.4 C)   Wt 274 lb (124.3 kg)   SpO2 99%   BMI 40.46 kg/m  Wt Readings from Last 3 Encounters:  09/20/23 274 lb (124.3 kg)  08/23/23 279 lb (126.6 kg)  05/19/23 270 lb (122.5 kg)    Lab Results  Component Value Date   TSH 3.788 06/24/2022   Lab Results  Component Value Date   WBC 7.8 01/16/2023   HGB 14.6 01/16/2023   HCT 42.2 01/16/2023   MCV 80.7 01/16/2023   PLT 347 01/16/2023   Lab Results  Component Value Date   NA 139 08/23/2023   K 4.5 08/23/2023   CO2 26 08/23/2023   GLUCOSE 121 (H) 08/23/2023   BUN 13 08/23/2023   CREATININE 0.86 08/23/2023   BILITOT 0.4 08/23/2023   ALKPHOS 99 08/23/2023   AST 19 08/23/2023   ALT 24 08/23/2023   PROT 7.5 08/23/2023   ALBUMIN 3.9 (L) 08/23/2023   CALCIUM 9.6 08/23/2023   ANIONGAP 7 07/11/2022   EGFR 113 08/23/2023  Lab Results  Component Value Date   CHOL 189 08/23/2023   Lab Results  Component Value Date   HDL 53 08/23/2023   Lab Results  Component Value Date   LDLCALC 114 (H) 08/23/2023   Lab Results  Component Value Date   TRIG 125 08/23/2023   Lab Results  Component Value Date   CHOLHDL 3.6 08/23/2023   Lab Results  Component Value Date   HGBA1C 7.4 (A) 08/23/2023       Assessment & Plan:   Problem List Items Addressed This Visit       Cardiovascular and Mediastinum   Primary hypertension - Primary   Chronic medical condition currently well-controlled on amlodipine 5 mg daily, lisinopril 10 mg daily Continue current medications DASH diet and commitment to daily physical activity for a minimum of 30 minutes discussed and encouraged, as a part of hypertension management. The importance of attaining a healthy weight is also discussed.     09/20/2023   11:18 AM 09/20/2023   11:03 AM 08/23/2023   10:52 AM 08/23/2023   10:29 AM 07/10/2023    2:34 PM 05/19/2023   11:33 AM 04/07/2023   11:39 AM  BP/Weight  Systolic BP 120 134 148 150 133 133 131  Diastolic BP 69 80 77 81 89 79 72  Wt. (Lbs)  274  279  270   BMI  40.46 kg/m2  41.2 kg/m2  39.87 kg/m2              Endocrine   Type 2 diabetes mellitus with hyperglycemia (HCC)   Lab Results  Component Value Date   HGBA1C 7.4 (A) 08/23/2023    Patient counseled on low-carb diet Continue metformin 1000 mg daily Encouraged to engage in regular moderate to vigorous exercise at least 150 minutes weekly Recheck labs in 2 months Has upcoming diabetic eye exam in April        Musculoskeletal and Integument   Tinea pedis of both feet   Did not receive the prescription sent during his last visit, prescription resent today Patient encouraged to keep his feet clean dry and well aerated, use of  cotton socks encouraged  - Clotrimazole 1 % OINT; Apply to affected and surrounding area in between toes  twice daily for 2 week  Dispense: 56 g; Refill: 0       Relevant Medications   Clotrimazole 1 % OINT     Other   Tobacco abuse   He has quit smoking, patient congratulated on his efforts at smoking cessation.  Encouraged to continue to abstain from smoking cigarettes.       Dyslipidemia, goal LDL below 70   Lab Results  Component Value Date   CHOL 189 08/23/2023   HDL 53 08/23/2023   LDLCALC 114 (H)  08/23/2023   TRIG 125 08/23/2023   CHOLHDL 3.6 08/23/2023  Started taking atorvastatin 40 mg daily about 1 week ago We will recheck labs at next visit Continue atorvastatin 40 mg daily, avoid fatty fried foods       Meds ordered this encounter  Medications   Clotrimazole 1 % OINT    Sig: Apply to affected and surrounding area in between toes  twice daily for 2 week    Dispense:  56 g    Refill:  0    Follow-up: Return in about 9 weeks (around 11/22/2023) for DM, CPE.    Donell Beers, FNP

## 2023-09-20 NOTE — Patient Instructions (Addendum)
 Congratulations on your smoking cessation.  Please come fasting to your next appointment so we can recheck your cholesterol level  Goal for fasting blood sugar ranges from 80 to 120 and 2 hours after any meal or at bedtime should be between 130 to 170.    It is important that you exercise regularly at least 30 minutes 5 times a week as tolerated  Think about what you will eat, plan ahead. Choose " clean, green, fresh or frozen" over canned, processed or packaged foods which are more sugary, salty and fatty. 70 to 75% of food eaten should be vegetables and fruit. Three meals at set times with snacks allowed between meals, but they must be fruit or vegetables. Aim to eat over a 12 hour period , example 7 am to 7 pm, and STOP after  your last meal of the day. Drink water,generally about 64 ounces per day, no other drink is as healthy. Fruit juice is best enjoyed in a healthy way, by EATING the fruit.  Thanks for choosing [facility] we consider it a privelige to serve you.

## 2023-09-20 NOTE — Assessment & Plan Note (Addendum)
 Lab Results  Component Value Date   CHOL 189 08/23/2023   HDL 53 08/23/2023   LDLCALC 114 (H) 08/23/2023   TRIG 125 08/23/2023   CHOLHDL 3.6 08/23/2023  Started taking atorvastatin 40 mg daily about 1 week ago We will recheck labs at next visit Continue atorvastatin 40 mg daily, avoid fatty fried foods

## 2023-09-20 NOTE — Assessment & Plan Note (Signed)
 Did not receive the prescription sent during his last visit, prescription resent today Patient encouraged to keep his feet clean dry and well aerated, use of  cotton socks encouraged  - Clotrimazole 1 % OINT; Apply to affected and surrounding area in between toes  twice daily for 2 week  Dispense: 56 g; Refill: 0

## 2023-09-20 NOTE — Assessment & Plan Note (Addendum)
 Lab Results  Component Value Date   HGBA1C 7.4 (A) 08/23/2023    Patient counseled on low-carb diet Continue metformin 1000 mg daily Encouraged to engage in regular moderate to vigorous exercise at least 150 minutes weekly Recheck labs in 2 months Has upcoming diabetic eye exam in April

## 2023-09-20 NOTE — Assessment & Plan Note (Signed)
 Chronic medical condition currently well-controlled on amlodipine 5 mg daily, lisinopril 10 mg daily Continue current medications DASH diet and commitment to daily physical activity for a minimum of 30 minutes discussed and encouraged, as a part of hypertension management. The importance of attaining a healthy weight is also discussed.     09/20/2023   11:18 AM 09/20/2023   11:03 AM 08/23/2023   10:52 AM 08/23/2023   10:29 AM 07/10/2023    2:34 PM 05/19/2023   11:33 AM 04/07/2023   11:39 AM  BP/Weight  Systolic BP 120 134 148 150 133 133 131  Diastolic BP 69 80 77 81 89 79 72  Wt. (Lbs)  274  279  270   BMI  40.46 kg/m2  41.2 kg/m2  39.87 kg/m2

## 2023-10-19 ENCOUNTER — Other Ambulatory Visit: Payer: Self-pay | Admitting: Pharmacist

## 2023-10-19 NOTE — Progress Notes (Signed)
   10/19/2023  Patient ID: [name], male   DOB: 1985-11-17, 38 y.o.   MRN: [id number]  Tried calling patient for follow-up on weight loss, diet, and continued smoking cessation. Unable to reach patient at this time or leave a number. Will try again in 1 week.   Ricka Burdock, PharmD [facility]  Phone Number: [phone]

## 2023-10-26 ENCOUNTER — Telehealth: Payer: Self-pay | Admitting: Pharmacist

## 2023-10-26 NOTE — Progress Notes (Signed)
   10/26/2023  Patient ID: [name], male   DOB: 09-30-1985, 38 y.o.   MRN: [id number]  Tried calling patient for follow-up on weight loss, diet, and continued smoking cessation. Unable to reach patient at this time or leave a number.   Second attempt. Will try again in 1 week.   Ricka Burdock, PharmD [facility]  Phone Number: [phone]

## 2023-11-02 ENCOUNTER — Telehealth: Payer: Self-pay | Admitting: Pharmacist

## 2023-11-02 LAB — HM DIABETES EYE EXAM

## 2023-11-02 NOTE — Progress Notes (Signed)
   11/02/2023  Patient ID: [name], male   DOB: 1985/08/05, 38 y.o.   MRN: [id number]  Tried calling patient for follow-up on weight loss, diet, and continued smoking cessation. Unable to reach patient at this time or leave a number.   Third and final attempt. Patient has follow-up with PCP at the end of the month.    Ricka Burdock, PharmD [facility]  Phone Number: [phone]

## 2023-11-22 ENCOUNTER — Ambulatory Visit: Payer: Self-pay | Admitting: Nurse Practitioner

## 2023-12-26 ENCOUNTER — Ambulatory Visit (INDEPENDENT_AMBULATORY_CARE_PROVIDER_SITE_OTHER): Payer: Self-pay | Admitting: Nurse Practitioner

## 2023-12-26 ENCOUNTER — Encounter: Payer: Self-pay | Admitting: Nurse Practitioner

## 2023-12-26 VITALS — BP 141/79 | HR 87 | Temp 97.0°F | Wt 277.0 lb

## 2023-12-26 DIAGNOSIS — E559 Vitamin D deficiency, unspecified: Secondary | ICD-10-CM | POA: Insufficient documentation

## 2023-12-26 DIAGNOSIS — E133499 Other specified diabetes mellitus with severe nonproliferative diabetic retinopathy without macular edema, unspecified eye: Secondary | ICD-10-CM

## 2023-12-26 DIAGNOSIS — I10 Essential (primary) hypertension: Secondary | ICD-10-CM

## 2023-12-26 DIAGNOSIS — E1165 Type 2 diabetes mellitus with hyperglycemia: Secondary | ICD-10-CM | POA: Diagnosis not present

## 2023-12-26 DIAGNOSIS — E11319 Type 2 diabetes mellitus with unspecified diabetic retinopathy without macular edema: Secondary | ICD-10-CM | POA: Insufficient documentation

## 2023-12-26 DIAGNOSIS — E1129 Type 2 diabetes mellitus with other diabetic kidney complication: Secondary | ICD-10-CM | POA: Insufficient documentation

## 2023-12-26 DIAGNOSIS — Z0000 Encounter for general adult medical examination without abnormal findings: Secondary | ICD-10-CM | POA: Diagnosis not present

## 2023-12-26 DIAGNOSIS — E785 Hyperlipidemia, unspecified: Secondary | ICD-10-CM

## 2023-12-26 DIAGNOSIS — F1090 Alcohol use, unspecified, uncomplicated: Secondary | ICD-10-CM | POA: Insufficient documentation

## 2023-12-26 DIAGNOSIS — S93402A Sprain of unspecified ligament of left ankle, initial encounter: Secondary | ICD-10-CM | POA: Insufficient documentation

## 2023-12-26 DIAGNOSIS — R809 Proteinuria, unspecified: Secondary | ICD-10-CM

## 2023-12-26 DIAGNOSIS — Z794 Long term (current) use of insulin: Secondary | ICD-10-CM

## 2023-12-26 LAB — POCT GLYCOSYLATED HEMOGLOBIN (HGB A1C): Hemoglobin A1C: 6.7 % — AB (ref 4.0–5.6)

## 2023-12-26 MED ORDER — ATORVASTATIN CALCIUM 40 MG PO TABS: 40.0000 mg | ORAL_TABLET | Freq: Every day | ORAL | 1 refills | Status: DC

## 2023-12-26 MED ORDER — METFORMIN HCL ER 500 MG PO TB24: ORAL_TABLET | ORAL | 1 refills | Status: DC

## 2023-12-26 MED ORDER — LISINOPRIL 20 MG PO TABS: 20.0000 mg | ORAL_TABLET | Freq: Every day | ORAL | 1 refills | Status: DC

## 2023-12-26 MED ORDER — AMLODIPINE BESYLATE 5 MG PO TABS: 5.0000 mg | ORAL_TABLET | Freq: Every day | ORAL | 1 refills | Status: DC

## 2023-12-26 NOTE — Assessment & Plan Note (Signed)
Annual exam as documented.  ?Counseling done include healthy lifestyle involving committing to 150 minutes of exercise per week, heart healthy diet, and attaining healthy weight. The importance of adequate sleep also discussed.  ?Regular use of seat belt and home safety were also discussed . ?Changes in health habits are decided on by patient with goals and time frames set for achieving them. ?Immunization and cancer screening  needs are specifically addressed at this visit.   ?

## 2023-12-26 NOTE — Patient Instructions (Addendum)
 Around 3 times per week, check your blood pressure 2 times per day. once in the morning and once in the evening. The readings should be at least one minute apart. Write down these values and bring them to your next nurse visit/appointment.  When you check your BP, make sure you have been doing something calm/relaxing 5 minutes prior to checking. Both feet should be flat on the floor and you should be sitting. Use your left arm and make sure it is in a relaxed position (on a table), and that the cuff is at the approximate level/height of your heart. Blood pressure goal is less than 130/80   1. Primary hypertension  - amLODipine  (NORVASC ) 5 MG tablet; Take 1 tablet (5 mg total) by mouth daily.  Dispense: 90 tablet; Refill: 1 - CMP14+EGFR - Lipid panel - lisinopril  (ZESTRIL ) 20 MG tablet; Take 1 tablet (20 mg total) by mouth daily.  Dispense: 90 tablet; Refill: 1 - Microalbumin / creatinine urine ratio  2. Type 2 diabetes mellitus with hyperglycemia, with long-term current use of insulin  (HCC)  - POCT glycosylated hemoglobin (Hb A1C) - metFORMIN  (GLUCOPHAGE -XR) 500 MG 24 hr tablet; Take 2 tablets with food daily.  Dispense: 180 tablet; Refill: 1 - CBC   It is important that you exercise regularly at least 30 minutes 5 times a week as tolerated  Think about what you will eat, plan ahead. Choose " clean, green, fresh or frozen" over canned, processed or packaged foods which are more sugary, salty and fatty. 70 to 75% of food eaten should be vegetables and fruit. Three meals at set times with snacks allowed between meals, but they must be fruit or vegetables. Aim to eat over a 12 hour period , example 7 am to 7 pm, and STOP after  your last meal of the day. Drink water,generally about 64 ounces per day, no other drink is as healthy. Fruit juice is best enjoyed in a healthy way, by EATING the fruit.  Thanks for choosing [facility] we consider it a privelige to serve you.

## 2023-12-26 NOTE — Assessment & Plan Note (Signed)
 Encouraged to limit alcohol to no more than 2 drinks a day and even better avoid alcohol altogether

## 2023-12-26 NOTE — Assessment & Plan Note (Signed)
 Lab Results  Component Value Date   CHOL 189 08/23/2023   HDL 53 08/23/2023   LDLCALC 114 (H) 08/23/2023   TRIG 125 08/23/2023   CHOLHDL 3.6 08/23/2023  Checking lipid panel Currently on atorvastatin  40 mg daily

## 2023-12-26 NOTE — Assessment & Plan Note (Signed)
 Use of compression and elevation discussed

## 2023-12-26 NOTE — Assessment & Plan Note (Signed)
 Need to continue to keep diabetes under control discussed On lisinopril 

## 2023-12-26 NOTE — Assessment & Plan Note (Signed)
 Followed by ophthalmologist Need to continue to keep diabetes under control discussed

## 2023-12-26 NOTE — Assessment & Plan Note (Addendum)
 Start lisinopril  20 mg daily continue amlodipine  5 mg daily Blood pressure goal is less than 130/80 Encouraged to monitor blood pressure at home keep a log and bring to next visit in 4 weeks DASH diet and commitment to daily physical activity for a minimum of 30 minutes discussed and encouraged, as a part of hypertension management. The importance of attaining a healthy weight is also discussed.     12/26/2023   11:52 AM 12/26/2023   11:40 AM 09/20/2023   11:18 AM 09/20/2023   11:03 AM 08/23/2023   10:52 AM 08/23/2023   10:29 AM 07/10/2023    2:34 PM  BP/Weight  Systolic BP 141 149 120 134 148 150 133  Diastolic BP 79 79 69 80 77 81 89  Wt. (Lbs)  277  274  279   BMI  40.91 kg/m2  40.46 kg/m2  41.2 kg/m2

## 2023-12-26 NOTE — Assessment & Plan Note (Signed)
 Last vitamin D  Lab Results  Component Value Date   VD25OH 8.3 (L) 10/26/2022  Checking vitamin D  levels Currently not on vitamin D  supplement

## 2023-12-26 NOTE — Assessment & Plan Note (Signed)
 Lab Results  Component Value Date   HGBA1C 6.7 (A) 12/26/2023  Currently well-controlled on metformin  1000 mg daily Continue current medication Patient counseled on low-carb diet Encouraged to engage in regular moderate exercises at least [place] 50 minutes weekly as tolerated Up-to-date with diabetic eye exam

## 2023-12-26 NOTE — Assessment & Plan Note (Signed)
 Wt Readings from Last 3 Encounters:  12/26/23 277 lb (125.6 kg)  09/20/23 274 lb (124.3 kg)  08/23/23 279 lb (126.6 kg)   Body mass index is 40.91 kg/m.  Patient counseled on low-carb diet Encouraged to engage in regular moderate to vigorous exercises at least 150 minutes weekly as tolerated

## 2023-12-26 NOTE — Progress Notes (Signed)
 Complete physical exam  Patient: [name]   DOB: 1986-01-04   38 y.o. Male  MRN: [id number]  Subjective:     Chief Complaint  Patient presents with  . Annual Exam    fasting  . Diabetes    [name] is a 38 y.o. 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 who presents today for a complete physical exam. He reports consuming a low carb diet , low salt diet . Does weight lifting ,push ups  and walking dail for exercises . He generally feels well. He reports sleeping well. He does have additional problems to discuss today.   He sprained his left ankle 3 weeks ago while at work, he initially had pain but the pain has since resolved still has some swelling on the ankle    Most recent fall risk assessment:    01/16/2023   11:20 AM  Fall Risk   Falls in the past year? 0  Risk for fall due to : No Fall Risks  Follow up Falls evaluation completed     Most recent depression screenings:    12/26/2023   11:40 AM 08/23/2023   10:40 AM  PHQ 2/9 Scores  PHQ - 2 Score 0 0  PHQ- 9 Score 1 1        Patient Care Team: Aliany Fiorenza R, FNP as PCP - General (Nurse Practitioner)   Outpatient Medications Prior to Visit  Medication Sig  . Accu-Chek FastClix Lancets MISC Use as directed 4 times a day before meals and at bedtime  . Accu-Chek Softclix Lancets lancets Use as instructed  . blood glucose meter kit and supplies KIT Use as directed  . Blood Pressure KIT Please check you blood pressure daily.  . clindamycin  (CLEOCIN  T) 1 % external solution Apply topically daily. to skin underarms  . [DISCONTINUED] amLODipine  (NORVASC ) 5 MG tablet Take 1 tablet (5 mg total) by mouth daily.  . [DISCONTINUED] atorvastatin   (LIPITOR) 40 MG tablet Take 1 tablet (40 mg total) by mouth daily.  . [DISCONTINUED] lisinopril  (ZESTRIL ) 10 MG tablet Take 1 tablet (10 mg total) by mouth daily.  . [DISCONTINUED] metFORMIN  (GLUCOPHAGE -XR) 500 MG 24 hr tablet Take 2 tablets with food daily.  . acetaminophen  (TYLENOL ) 325 MG tablet Take 2 tablets (650 mg total) by mouth every 8 (eight) hours as needed for moderate pain. (Patient not taking: Reported on 08/23/2023)  . Clotrimazole  1 % OINT Apply to affected and surrounding area in between toes  twice daily for 2 week (Patient not taking: Reported on 12/26/2023)  . gabapentin  (NEURONTIN ) 100 MG capsule Take 1 capsule (100 mg total) by mouth 3 (three) times daily. (Patient not taking: Reported on 08/23/2023)  . glucose blood (ACCU-CHEK GUIDE) test strip Use as instructed (Patient not taking: Reported on 09/20/2023)  . Probiotic Product (RISA-BID PROBIOTIC) TABS Take 1 tablet by mouth 2 (two) times daily. (Patient not taking: Reported on 08/23/2023)  . trimethoprim -polymyxin b  (POLYTRIM ) ophthalmic solution Place 1 drop into the left eye every 6 (six) hours. (Patient not taking: Reported on 12/26/2023)  . [DISCONTINUED] nicotine  (NICODERM CQ  - DOSED IN MG/24 HOURS) 14 mg/24hr patch Place 1 patch (14 mg total) onto the skin daily. (Patient not taking: Reported on 12/26/2023)   No facility-administered medications prior to visit.    Review  of Systems  Constitutional:  Negative for appetite change, chills, fatigue and fever.  HENT:  Negative for congestion, postnasal drip, rhinorrhea and sneezing.   Eyes:  Negative for pain, discharge and itching.  Respiratory:  Negative for cough, shortness of breath and wheezing.   Cardiovascular:  Negative for chest pain, palpitations and leg swelling.  Gastrointestinal:  Negative for abdominal pain, constipation, nausea and vomiting.  Endocrine: Negative for cold intolerance, heat intolerance and polydipsia.  Genitourinary:  Negative for difficulty  urinating, dysuria, flank pain and frequency.  Musculoskeletal:  Negative for arthralgias, back pain, joint swelling and myalgias.  Skin:  Negative for color change, pallor, rash and wound.  Neurological:  Negative for dizziness, facial asymmetry, weakness, numbness and headaches.  Psychiatric/Behavioral:  Negative for behavioral problems, confusion, self-injury and suicidal ideas.        Objective:     BP (!) 141/79   Pulse 87   Temp (!) 97 F (36.1 C)   Wt 277 lb (125.6 kg)   SpO2 100%   BMI 40.91 kg/m    Physical Exam Vitals and nursing note reviewed.  Constitutional:      General: He is not in acute distress.    Appearance: Normal appearance. He is obese. He is not ill-appearing, toxic-appearing or diaphoretic.  HENT:     Right Ear: Tympanic membrane, ear canal and external ear normal. There is no impacted cerumen.     Left Ear: Tympanic membrane, ear canal and external ear normal. There is no impacted cerumen.     Nose: Nose normal. No congestion or rhinorrhea.     Mouth/Throat:     Mouth: Mucous membranes are moist.     Pharynx: Oropharynx is clear. No oropharyngeal exudate or posterior oropharyngeal erythema.  Eyes:     General: No scleral icterus.       Right eye: No discharge.        Left eye: No discharge.     Extraocular Movements: Extraocular movements intact.     Conjunctiva/sclera: Conjunctivae normal.  Neck:     Vascular: No carotid bruit.  Cardiovascular:     Rate and Rhythm: Normal rate and regular rhythm.     Pulses: Normal pulses.     Heart sounds: Normal heart sounds. No murmur heard.    No friction rub. No gallop.  Pulmonary:     Effort: Pulmonary effort is normal. No respiratory distress.     Breath sounds: Normal breath sounds. No stridor. No wheezing, rhonchi or rales.  Chest:     Chest wall: No tenderness.  Abdominal:     General: Bowel sounds are normal. There is no distension.     Palpations: Abdomen is soft. There is no mass.      Tenderness: There is no abdominal tenderness. There is no right CVA tenderness, left CVA tenderness, guarding or rebound.     Hernia: No hernia is present.  Musculoskeletal:        General: Swelling present. No tenderness, deformity or signs of injury.     Cervical back: Normal range of motion and neck supple. No rigidity or tenderness.     Right lower leg: No edema.     Left lower leg: No edema.     Comments: Left ankle swelling extending to the lower leg.  Skin is warm and dry no redness noted has palpable pedal pulses  Lymphadenopathy:     Cervical: No cervical adenopathy.  Skin:    General: Skin is warm and dry.  Capillary Refill: Capillary refill takes less than 2 seconds.     Coloration: Skin is not jaundiced or pale.     Findings: No bruising, erythema, lesion or rash.  Neurological:     Mental Status: He is alert and oriented to person, place, and time.     Cranial Nerves: No cranial nerve deficit.     Sensory: No sensory deficit.     Motor: No weakness.     Coordination: Coordination normal.     Gait: Gait normal.     Deep Tendon Reflexes: Reflexes normal.  Psychiatric:        Mood and Affect: Mood normal.        Behavior: Behavior normal.        Thought Content: Thought content normal.        Judgment: Judgment normal.    Results for orders placed or performed in visit on 12/26/23  POCT glycosylated hemoglobin (Hb A1C)  Result Value Ref Range   Hemoglobin A1C 6.7 (A) 4.0 - 5.6 %   HbA1c POC (<> result, manual entry)     HbA1c, POC (prediabetic range)     HbA1c, POC (controlled diabetic range)         Assessment & Plan:    Routine Health Maintenance and Physical Exam  Immunization History  Administered Date(s) Administered  . Tdap 09/16/2016    Health Maintenance  Topic Date Due  . COVID-19 Vaccine (1 - 2024-25 season) Never done  . Pneumococcal Vaccine 44-39 Years old (1 of 2 - PCV) 08/22/2024 (Originally 03/03/2003)  . INFLUENZA VACCINE  02/23/2024  .  HEMOGLOBIN A1C  06/26/2024  . Diabetic kidney evaluation - eGFR measurement  08/22/2024  . Diabetic kidney evaluation - Urine ACR  08/22/2024  . FOOT EXAM  08/22/2024  . OPHTHALMOLOGY EXAM  11/01/2024  . DTaP/Tdap/Td (2 - Td or Tdap) 09/16/2026  . Hepatitis C Screening  Completed  . HIV Screening  Completed  . HPV VACCINES  Aged Out  . Meningococcal B Vaccine  Aged Out    Discussed health benefits of physical activity, and encouraged him to engage in regular exercise appropriate for his age and condition.  Problem List Items Addressed This Visit       Cardiovascular and Mediastinum   Primary hypertension   Start lisinopril  20 mg daily continue amlodipine  5 mg daily Blood pressure goal is less than 130/80 Encouraged to monitor blood pressure at home keep a log and bring to next visit in 4 weeks DASH diet and commitment to daily physical activity for a minimum of 30 minutes discussed and encouraged, as a part of hypertension management. The importance of attaining a healthy weight is also discussed.     12/26/2023   11:52 AM 12/26/2023   11:40 AM 09/20/2023   11:18 AM 09/20/2023   11:03 AM 08/23/2023   10:52 AM 08/23/2023   10:29 AM 07/10/2023    2:34 PM  BP/Weight  Systolic BP 141 149 120 134 148 150 133  Diastolic BP 79 79 69 80 77 81 89  Wt. (Lbs)  277  274  279   BMI  40.91 kg/m2  40.46 kg/m2  41.2 kg/m2            Relevant Medications   amLODipine  (NORVASC ) 5 MG tablet   atorvastatin  (LIPITOR) 40 MG tablet   lisinopril  (ZESTRIL ) 20 MG tablet   Other Relevant Orders   CMP14+EGFR   Lipid panel   Microalbumin / creatinine urine ratio  Endocrine   Type 2 diabetes mellitus with hyperglycemia (HCC)   Lab Results  Component Value Date   HGBA1C 6.7 (A) 12/26/2023  Currently well-controlled on metformin  1000 mg daily Continue current medication Patient counseled on low-carb diet Encouraged to engage in regular moderate exercises at least [place] 50 minutes weekly as  tolerated Up-to-date with diabetic eye exam      Relevant Medications   atorvastatin  (LIPITOR) 40 MG tablet   metFORMIN  (GLUCOPHAGE -XR) 500 MG 24 hr tablet   lisinopril  (ZESTRIL ) 20 MG tablet   Other Relevant Orders   POCT glycosylated hemoglobin (Hb A1C) (Completed)   CBC   Diabetic retinopathy (HCC)   Followed by ophthalmologist Need to continue to keep diabetes under control discussed      Relevant Medications   atorvastatin  (LIPITOR) 40 MG tablet   metFORMIN  (GLUCOPHAGE -XR) 500 MG 24 hr tablet   lisinopril  (ZESTRIL ) 20 MG tablet   Microalbuminuria due to type 2 diabetes mellitus (HCC)   Need to continue to keep diabetes under control discussed On lisinopril        Relevant Medications   atorvastatin  (LIPITOR) 40 MG tablet   metFORMIN  (GLUCOPHAGE -XR) 500 MG 24 hr tablet   lisinopril  (ZESTRIL ) 20 MG tablet     Musculoskeletal and Integument   Sprain of left ankle   Use of compression and elevation discussed        Other   Morbid obesity (HCC)   Wt Readings from Last 3 Encounters:  12/26/23 277 lb (125.6 kg)  09/20/23 274 lb (124.3 kg)  08/23/23 279 lb (126.6 kg)   Body mass index is 40.91 kg/m.  Patient counseled on low-carb diet Encouraged to engage in regular moderate to vigorous exercises at least 150 minutes weekly as tolerated      Relevant Medications   metFORMIN  (GLUCOPHAGE -XR) 500 MG 24 hr tablet   Annual physical exam - Primary   Annual exam as documented.  Counseling done include healthy lifestyle involving committing to 150 minutes of exercise per week, heart healthy diet, and attaining healthy weight. The importance of adequate sleep also discussed.  Regular use of seat belt and home safety were also discussed . Changes in health habits are decided on by patient with goals and time frames set for achieving them. Immunization and cancer screening  needs are specifically addressed at this visit.         Dyslipidemia, goal LDL below 70   Lab  Results  Component Value Date   CHOL 189 08/23/2023   HDL 53 08/23/2023   LDLCALC 114 (H) 08/23/2023   TRIG 125 08/23/2023   CHOLHDL 3.6 08/23/2023  Checking lipid panel Currently on atorvastatin  40 mg daily      Relevant Medications   amLODipine  (NORVASC ) 5 MG tablet   atorvastatin  (LIPITOR) 40 MG tablet   lisinopril  (ZESTRIL ) 20 MG tablet   Alcohol use   Encouraged to limit alcohol to no more than 2 drinks a day and even better avoid alcohol altogether      Vitamin D  deficiency   Last vitamin D  Lab Results  Component Value Date   VD25OH 8.3 (L) 10/26/2022  Checking vitamin D  levels Currently not on vitamin D  supplement      Relevant Orders   VITAMIN D  25 Hydroxy (Vit-D Deficiency, Fractures)   Return in about 4 weeks (around 01/23/2024) for HTN.     Devan Danzer R Kanai Hilger, FNP

## 2023-12-27 ENCOUNTER — Ambulatory Visit: Payer: Self-pay | Admitting: Nurse Practitioner

## 2023-12-27 ENCOUNTER — Other Ambulatory Visit: Payer: Self-pay | Admitting: Nurse Practitioner

## 2023-12-27 DIAGNOSIS — E559 Vitamin D deficiency, unspecified: Secondary | ICD-10-CM

## 2023-12-27 LAB — CMP14+EGFR
ALT: 24 IU/L (ref 0–44)
AST: 21 IU/L (ref 0–40)
Albumin: 4.1 g/dL (ref 4.1–5.1)
Alkaline Phosphatase: 99 IU/L (ref 44–121)
BUN/Creatinine Ratio: 14 (ref 9–20)
BUN: 12 mg/dL (ref 6–20)
Bilirubin Total: 0.7 mg/dL (ref 0.0–1.2)
CO2: 20 mmol/L (ref 20–29)
Calcium: 10 mg/dL (ref 8.7–10.2)
Chloride: 101 mmol/L (ref 96–106)
Creatinine, Ser: 0.86 mg/dL (ref 0.76–1.27)
Globulin, Total: 3.8 g/dL (ref 1.5–4.5)
Glucose: 111 mg/dL — ABNORMAL HIGH (ref 70–99)
Potassium: 4.9 mmol/L (ref 3.5–5.2)
Sodium: 138 mmol/L (ref 134–144)
Total Protein: 7.9 g/dL (ref 6.0–8.5)
eGFR: 113 mL/min/{1.73_m2} (ref >59)

## 2023-12-27 LAB — CBC
Hematocrit: 41.4 % (ref 37.5–51.0)
Hemoglobin: 13.6 g/dL (ref 13.0–17.7)
MCH: 26.7 pg (ref 26.6–33.0)
MCHC: 32.9 g/dL (ref 31.5–35.7)
MCV: 81 fL (ref 79–97)
Platelets: 364 10*3/uL (ref 150–450)
RBC: 5.1 x10E6/uL (ref 4.14–5.80)
RDW: 15.7 % — ABNORMAL HIGH (ref 11.6–15.4)
WBC: 7.5 10*3/uL (ref 3.4–10.8)

## 2023-12-27 LAB — LIPID PANEL
Chol/HDL Ratio: 3.2 ratio (ref 0.0–5.0)
Cholesterol, Total: 171 mg/dL (ref 100–199)
HDL: 54 mg/dL (ref >39)
LDL Chol Calc (NIH): 93 mg/dL (ref 0–99)
Triglycerides: 134 mg/dL (ref 0–149)
VLDL Cholesterol Cal: 24 mg/dL (ref 5–40)

## 2023-12-27 LAB — MICROALBUMIN / CREATININE URINE RATIO
Creatinine, Urine: 108.5 mg/dL
Microalb/Creat Ratio: 1723 mg/g{creat} — ABNORMAL HIGH (ref 0–29)
Microalbumin, Urine: 1869.8 ug/mL

## 2023-12-27 LAB — VITAMIN D 25 HYDROXY (VIT D DEFICIENCY, FRACTURES): Vit D, 25-Hydroxy: 12.2 ng/mL — ABNORMAL LOW (ref 30.0–100.0)

## 2023-12-27 MED ORDER — VITAMIN D (ERGOCALCIFEROL) 1.25 MG (50000 UNIT) PO CAPS: 50000.0000 [IU] | ORAL_CAPSULE | ORAL | 0 refills | Status: AC

## 2024-02-07 ENCOUNTER — Ambulatory Visit: Payer: Self-pay | Admitting: Nurse Practitioner

## 2024-03-12 ENCOUNTER — Other Ambulatory Visit: Payer: Self-pay | Admitting: Nephrology

## 2024-03-12 DIAGNOSIS — R809 Proteinuria, unspecified: Secondary | ICD-10-CM

## 2024-03-13 ENCOUNTER — Encounter: Payer: Self-pay | Admitting: Nurse Practitioner

## 2024-03-13 ENCOUNTER — Ambulatory Visit (INDEPENDENT_AMBULATORY_CARE_PROVIDER_SITE_OTHER): Payer: Self-pay | Admitting: Nurse Practitioner

## 2024-03-13 VITALS — BP 142/71 | HR 93 | Wt 274.0 lb

## 2024-03-13 DIAGNOSIS — H1013 Acute atopic conjunctivitis, bilateral: Secondary | ICD-10-CM | POA: Diagnosis not present

## 2024-03-13 DIAGNOSIS — R809 Proteinuria, unspecified: Secondary | ICD-10-CM

## 2024-03-13 DIAGNOSIS — E785 Hyperlipidemia, unspecified: Secondary | ICD-10-CM | POA: Diagnosis not present

## 2024-03-13 DIAGNOSIS — E559 Vitamin D deficiency, unspecified: Secondary | ICD-10-CM

## 2024-03-13 DIAGNOSIS — E1129 Type 2 diabetes mellitus with other diabetic kidney complication: Secondary | ICD-10-CM

## 2024-03-13 DIAGNOSIS — I10 Essential (primary) hypertension: Secondary | ICD-10-CM

## 2024-03-13 DIAGNOSIS — F1090 Alcohol use, unspecified, uncomplicated: Secondary | ICD-10-CM

## 2024-03-13 DIAGNOSIS — E1165 Type 2 diabetes mellitus with hyperglycemia: Secondary | ICD-10-CM | POA: Diagnosis not present

## 2024-03-13 MED ORDER — OLOPATADINE HCL 0.2 % OP SOLN: OPHTHALMIC | 0 refills | Status: DC

## 2024-03-13 MED ORDER — DAPAGLIFLOZIN PROPANEDIOL 10 MG PO TABS: 10.0000 mg | ORAL_TABLET | Freq: Every day | ORAL | 3 refills | Status: DC

## 2024-03-13 NOTE — Assessment & Plan Note (Signed)
 Continue lisinopril  20 mg daily Under care of nephrology Trying to determine insurance coverage for an SGLT2 Lab Results  Component Value Date   LABMICR 1,869.8 12/26/2023   LABMICR 1,288.1 08/23/2023

## 2024-03-13 NOTE — Patient Instructions (Signed)
 Around 3 times per week, check your blood pressure 2 times per day. once in the morning and once in the evening. The readings should be at least one minute apart. Write down these values and bring them to your next nurse visit/appointment.  When you check your BP, make sure you have been doing something calm/relaxing 5 minutes prior to checking. Both feet should be flat on the floor and you should be sitting. Use your left arm and make sure it is in a relaxed position (on a table), and that the cuff is at the approximate level/height of your heart.  Please notify the office if your blood pressure consistently stays greater than 130/80  If able to get Farxiga , stop taking metformin .    1. Type 2 diabetes mellitus with hyperglycemia, without long-term current use of insulin  (HCC) (Primary)  - Hemoglobin A1c; Future - dapagliflozin  propanediol (FARXIGA ) 10 MG TABS tablet; Take 1 tablet (10 mg total) by mouth daily before breakfast.  Dispense: 30 tablet; Refill: 3  2. Dyslipidemia, goal LDL below 70  - Lipid panel; Future  3. Allergic conjunctivitis of both eyes  - Olopatadine  HCl 0.2 % SOLN; Instill 1 drop into each  eye once daily  Dispense: 2.5 mL; Refill: 0  4. Vitamin D  deficiency  - VITAMIN D  25 Hydroxy (Vit-D Deficiency, Fractures); Future     It is important that you exercise regularly at least 30 minutes 5 times a week as tolerated  Think about what you will eat, plan ahead. Choose  clean, green, fresh or frozen over canned, processed or packaged foods which are more sugary, salty and fatty. 70 to 75% of food eaten should be vegetables and fruit. Three meals at set times with snacks allowed between meals, but they must be fruit or vegetables. Aim to eat over a 12 hour period , example 7 am to 7 pm, and STOP after  your last meal of the day. Drink water,generally about 64 ounces per day, no other drink is as healthy. Fruit juice is best enjoyed in a healthy way, by EATING the  fruit.  Thanks for choosing [facility] we consider it a privelige to serve you.

## 2024-03-13 NOTE — Assessment & Plan Note (Signed)
 Lab Results  Component Value Date   CHOL 171 12/26/2023   HDL 54 12/26/2023   LDLCALC 93 12/26/2023   TRIG 134 12/26/2023   CHOLHDL 3.2 12/26/2023   Previous cholesterol level 93 mg/dL. Goal for cholesterol in diabetic patients is <70 mg/dL. - Order labs to check cholesterol levels in early September.

## 2024-03-13 NOTE — Progress Notes (Signed)
 Established Patient Office Visit  Subjective:  Patient ID: [name], male    DOB: 1985/10/06  Age: 38 y.o. MRN: [id number]  CC:  Chief Complaint  Patient presents with   Hypertension    HPI   Discussed the use of AI scribe software for clinical note transcription with the patient, who gave verbal consent to proceed.  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  Patient presents for follow-up for his chronic medical conditions    His blood pressure remains slightly elevated, with recent home readings around 130/80 mmHg and a recent reading of 124/80 mmHg at a nephrology visit. He is currently taking amlodipine  5 mg daily and lisinopril  20 mg daily.  He experiences symptoms suggestive of allergies, including runny eyes, sneezing, and a headache, which began on Monday and were severe enough to cause him to leave work. He took Tylenol  for the headache and feels better today. He has not taken any specific allergy medication.  He has a history of eye surgery for diabetic retinopathy, including laser surgery and injections, due to bleeding behind the eye, which required additional surgery. He is currently using prednisone eye drops, with the last dose scheduled for tomorrow, and reports some blurriness in the right eye, which he attributes to the prednisone.  He manages his diabetes with metformin , which he reduced to 500 mg daily because he was concerned about his kidney function. His blood sugar levels range from 112 to 140 mg/dL. He also takes atorvastatin  40 mg daily for cholesterol management.  He has a history of proteinuria and is under nephrology care. He has  stopped alcohol consumption due to concerns about protein levels in his urine and is scheduled for an ultrasound next month to monitor kidney function.  He engages in regular physical activity, walking a mile every other day, and follows a diet low in fried foods and sweets, occasionally indulging on weekends. He reports feeling better than he has in years.    Takes vitamin D1000units daily     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   Allergy    Asthma    Breast abscess 06/28/2022   Chest wall abscess 08/10/2022   Diabetes mellitus without complication (HCC)    Hyperlipidemia    Hypertension    Intertrigo 08/10/2022   Obesity    Tobacco use disorder    Vitamin D  deficiency     Past Surgical History:  Procedure Laterality Date   INCISION AND DRAINAGE ABSCESS Left 07/01/2022   Procedure: INCISION AND DRAINAGE ABSCESS;  Surgeon: Curvin Deward MOULD, MD;  [facility];  Service: General;  Laterality: Left;   IRRIGATION AND DEBRIDEMENT ABSCESS Left 07/03/2022   Procedure: IRRIGATION AND DEBRIDEMENT CHEST WALL / Breast ABSCESS;  Surgeon: Sebastian Moles, MD;  Location: [facility] OR;  Service: General;  Laterality: Left;    Family History  Problem Relation Age of Onset   Diabetes Mother    Breast cancer Maternal Grandmother    Diabetes Paternal Grandmother    Diabetes Paternal Grandfather    Colon cancer Neg Hx     Social History   Socioeconomic History   Marital status: Single    Spouse name: Not on file   Number of  children: 1   Years of education: Not on file   Highest education level: Not on file  Occupational History   Not on file  Tobacco Use   Smoking status: Former    Current packs/day: 0.00    Types: Cigarettes    Quit date: 08/2023    Years since quitting: 0.5    Passive exposure: Never   Smokeless tobacco: Never  Substance and Sexual Activity   Alcohol use: Yes    Alcohol/week: 13.0 standard drinks of alcohol    Types: 9 Cans  of beer, 4 Shots of liquor per week    Comment: weekends only . 6 packs on those day.   Drug use: Not Currently    Types: Marijuana   Sexual activity: Yes  Other Topics Concern   Not on file  Social History Narrative   Lives with his mother.    Social Drivers of Corporate investment banker Strain: Not on file  Food Insecurity: No Food Insecurity (06/28/2022)   Hunger Vital Sign    Worried About Running Out of Food in the Last Year: Never true    Ran Out of Food in the Last Year: Never true  Transportation Needs: No Transportation Needs (06/28/2022)   PRAPARE - Administrator, Civil Service (Medical): No    Lack of Transportation (Non-Medical): No  Physical Activity: Not on file  Stress: Not on file  Social Connections: Not on file  Intimate Partner Violence: Not At Risk (06/28/2022)   Humiliation, Afraid, Rape, and Kick questionnaire    Fear of Current or Ex-Partner: No    Emotionally Abused: No    Physically Abused: No    Sexually Abused: No    Outpatient Medications Prior to Visit  Medication Sig Dispense Refill   Accu-Chek FastClix Lancets MISC Use as directed 4 times a day before meals and at bedtime 102 each 0   Accu-Chek Softclix Lancets lancets Use as instructed 100 each 12   acetaminophen  (TYLENOL ) 325 MG tablet Take 2 tablets (650 mg total) by mouth every 8 (eight) hours as needed for moderate pain. 20 tablet 0   amLODipine  (NORVASC ) 5 MG tablet Take 1 tablet (5 mg total) by mouth daily. 90 tablet 1   atorvastatin  (LIPITOR) 40 MG tablet Take 1 tablet (40 mg total) by mouth daily. 90 tablet 1   blood glucose meter kit and supplies KIT Use as directed 1 each 0   Blood Pressure KIT Please check you blood pressure daily. 1 kit 0   clindamycin  (CLEOCIN  T) 1 % external solution Apply topically daily. to skin underarms 60 mL 2   glucose blood (ACCU-CHEK GUIDE) test strip Use as instructed 100 strip 2   lisinopril  (ZESTRIL ) 20 MG tablet Take 1 tablet (20 mg total)  by mouth daily. 90 tablet 1   metFORMIN  (GLUCOPHAGE -XR) 500 MG 24 hr tablet Take 2 tablets with food daily. 180 tablet 1   VITAMIN D  PO Take by mouth.     Clotrimazole  1 % OINT Apply to affected and surrounding area in between toes  twice daily for 2 week (Patient not taking: Reported on 03/13/2024) 56 g 0   gabapentin  (NEURONTIN ) 100 MG capsule Take 1 capsule (100 mg total) by mouth 3 (three) times daily. (Patient not taking: Reported on 03/13/2024) 90 capsule 3   Probiotic Product (RISA-BID PROBIOTIC) TABS Take 1 tablet by mouth 2 (two) times daily. (Patient not taking: Reported on 03/13/2024) 60 tablet 2   trimethoprim -polymyxin b  (  POLYTRIM ) ophthalmic solution Place 1 drop into the left eye every 6 (six) hours. (Patient not taking: Reported on 03/13/2024) 10 mL 0   Vitamin D , Ergocalciferol , (DRISDOL ) 1.25 MG (50000 UNIT) CAPS capsule Take 1 capsule (50,000 Units total) by mouth every 7 (seven) days. (Patient not taking: Reported on 03/13/2024) 8 capsule 0   No facility-administered medications prior to visit.    Allergies  Allergen Reactions   Bee Pollen    Peach [Prunus Persica]     Pt reports swelling and lip tingling as reaction    ROS Review of Systems  Constitutional:  Negative for appetite change, chills, fatigue and fever.  HENT:  Negative for congestion, postnasal drip, rhinorrhea and sneezing.   Eyes:  Positive for redness and itching. Negative for discharge.  Respiratory:  Negative for cough, shortness of breath and wheezing.   Cardiovascular:  Negative for chest pain, palpitations and leg swelling.  Gastrointestinal:  Negative for abdominal pain, constipation, nausea and vomiting.  Genitourinary:  Negative for difficulty urinating, dysuria, flank pain and frequency.  Musculoskeletal:  Negative for arthralgias, back pain, joint swelling and myalgias.  Skin:  Negative for color change, pallor, rash and wound.  Neurological:  Negative for dizziness, facial asymmetry, weakness,  numbness and headaches.  Psychiatric/Behavioral:  Negative for behavioral problems, confusion, self-injury and suicidal ideas.       Objective:    Physical Exam Vitals and nursing note reviewed.  Constitutional:      General: He is not in acute distress.    Appearance: Normal appearance. He is obese. He is not ill-appearing, toxic-appearing or diaphoretic.  HENT:     Mouth/Throat:     Mouth: Mucous membranes are moist.     Pharynx: Oropharynx is clear. No oropharyngeal exudate or posterior oropharyngeal erythema.  Eyes:     General: No scleral icterus.       Right eye: No discharge.        Left eye: No discharge.     Extraocular Movements: Extraocular movements intact.     Conjunctiva/sclera: Conjunctivae normal.  Cardiovascular:     Rate and Rhythm: Normal rate and regular rhythm.     Pulses: Normal pulses.     Heart sounds: Normal heart sounds. No murmur heard.    No friction rub. No gallop.  Pulmonary:     Effort: Pulmonary effort is normal. No respiratory distress.     Breath sounds: Normal breath sounds. No stridor. No wheezing, rhonchi or rales.  Chest:     Chest wall: No tenderness.  Abdominal:     General: There is no distension.     Palpations: Abdomen is soft.     Tenderness: There is no abdominal tenderness. There is no right CVA tenderness, left CVA tenderness or guarding.  Musculoskeletal:        General: No swelling, tenderness, deformity or signs of injury.     Right lower leg: No edema.     Left lower leg: No edema.  Skin:    General: Skin is warm and dry.     Capillary Refill: Capillary refill takes less than 2 seconds.     Coloration: Skin is not jaundiced or pale.     Findings: No bruising, erythema or lesion.  Neurological:     Mental Status: He is alert and oriented to person, place, and time.     Motor: No weakness.     Coordination: Coordination normal.     Gait: Gait normal.  Psychiatric:  Mood and Affect: Mood normal.        Behavior:  Behavior normal.        Thought Content: Thought content normal.        Judgment: Judgment normal.     BP (!) 142/71   Pulse 93   Wt 274 lb (124.3 kg)   SpO2 100%   BMI 40.46 kg/m  Wt Readings from Last 3 Encounters:  03/13/24 274 lb (124.3 kg)  12/26/23 277 lb (125.6 kg)  09/20/23 274 lb (124.3 kg)    Lab Results  Component Value Date   TSH 3.788 06/24/2022   Lab Results  Component Value Date   WBC 7.5 12/26/2023   HGB 13.6 12/26/2023   HCT 41.4 12/26/2023   MCV 81 12/26/2023   PLT 364 12/26/2023   Lab Results  Component Value Date   NA 138 12/26/2023   K 4.9 12/26/2023   CO2 20 12/26/2023   GLUCOSE 111 (H) 12/26/2023   BUN 12 12/26/2023   CREATININE 0.86 12/26/2023   BILITOT 0.7 12/26/2023   ALKPHOS 99 12/26/2023   AST 21 12/26/2023   ALT 24 12/26/2023   PROT 7.9 12/26/2023   ALBUMIN 4.1 12/26/2023   CALCIUM  10.0 12/26/2023   ANIONGAP 7 07/11/2022   EGFR 113 12/26/2023   Lab Results  Component Value Date   CHOL 171 12/26/2023   Lab Results  Component Value Date   HDL 54 12/26/2023   Lab Results  Component Value Date   LDLCALC 93 12/26/2023   Lab Results  Component Value Date   TRIG 134 12/26/2023   Lab Results  Component Value Date   CHOLHDL 3.2 12/26/2023   Lab Results  Component Value Date   HGBA1C 6.7 (A) 12/26/2023      Assessment & Plan:   Assessment and Plan Assessment & Plan       Problem List Items Addressed This Visit       Cardiovascular and Mediastinum   Primary hypertension   BP Readings from Last 3 Encounters:  03/13/24 (!) 142/71  12/26/23 (!) 141/79  09/20/23 120/69   Blood pressure management ongoing with recent readings 124/80 mmHg. Home readings around 130/80 mmHg. Current medications include amlodipine  5 mg and lisinopril  20 mg daily. Goal is to maintain blood pressure <130/80 mmHg to prevent complications. - Encourage home blood pressure monitoring at least three times a week. - Maintain blood  pressure goal of <130/80 mmHg.  Please with blood pressure readings consistently greater then 130/80        Endocrine   Type 2 diabetes mellitus with hyperglycemia (HCC) - Primary   Lab Results  Component Value Date   HGBA1C 6.7 (A) 12/26/2023    Type 2 diabetes mellitus with hyperglycemia, diabetic retinopathy, and proteinuria Diabetes management ongoing with blood sugar levels 112-140 mg/dL. eGFR 113 . Diabetic retinopathy managed with laser and prednisone eye drops. Proteinuria present, considering Farxiga  or Jardiance for kidney protection, pending insurance coverage. Lisinopril  used for kidney protection. - Check insurance coverage for Farxiga  or Jardiance. - Continue metformin  1000 mg daily. - Order labs to check A1c in early September. - Encourage blood sugar monitoring with fasting levels 80-120 mg/dL and postprandial levels <170 mg/dL.      Relevant Medications   dapagliflozin  propanediol (FARXIGA ) 10 MG TABS tablet   Other Relevant Orders   Hemoglobin A1c   Microalbuminuria due to type 2 diabetes mellitus (HCC)   Continue lisinopril  20 mg daily Under care of nephrology Trying to determine  insurance coverage for an SGLT2 Lab Results  Component Value Date   LABMICR 1,869.8 12/26/2023   LABMICR 1,288.1 08/23/2023           Relevant Medications   dapagliflozin  propanediol (FARXIGA ) 10 MG TABS tablet     Other   Morbid obesity (HCC)   Wt Readings from Last 3 Encounters:  03/13/24 274 lb (124.3 kg)  12/26/23 277 lb (125.6 kg)  09/20/23 274 lb (124.3 kg)   Body mass index is 40.46 kg/m.   Weight management crucial for diabetes and hypertension control. Current activity includes walking a mile every other day. Weight loss can help reverse diabetes and improve blood pressure control. - Encourage weight loss through increased physical activity and dietary modifications. - Recommend moderate exercise for 30 minutes, five days a week.       Relevant Medications    dapagliflozin  propanediol (FARXIGA ) 10 MG TABS tablet   Dyslipidemia, goal LDL below 70   Lab Results  Component Value Date   CHOL 171 12/26/2023   HDL 54 12/26/2023   LDLCALC 93 12/26/2023   TRIG 134 12/26/2023   CHOLHDL 3.2 12/26/2023   Previous cholesterol level 93 mg/dL. Goal for cholesterol in diabetic patients is <70 mg/dL. - Order labs to check cholesterol levels in early September.      Relevant Orders   Lipid panel   Alcohol use   Alcohol use, in remission Alcohol use discontinued, beneficial for managing overall health. - Continue abstaining from alcohol.      Vitamin D  deficiency   Last vitamin D  Lab Results  Component Value Date   VD25OH 12.2 (L) 12/26/2023  On OTC vitamin D  supplement Will recheck labs      Relevant Orders   VITAMIN D  25 Hydroxy (Vit-D Deficiency, Fractures)   Allergic conjunctivitis of both eyes   Allergic conjunctivitis Recent onset of itchy, red eyes likely due to allergies. Symptoms include sneezing and headache. - Order allergy eye drops for symptomatic relief.      Relevant Medications   Olopatadine  HCl 0.2 % SOLN    Meds ordered this encounter  Medications   dapagliflozin  propanediol (FARXIGA ) 10 MG TABS tablet    Sig: Take 1 tablet (10 mg total) by mouth daily before breakfast.    Dispense:  30 tablet    Refill:  3   Olopatadine  HCl 0.2 % SOLN    Sig: Instill 1 drop into each  eye once daily    Dispense:  2.5 mL    Refill:  0    Follow-up: Return in about 4 months (around 07/13/2024) for CPE.    Offie Pickron R Yaire Kreher, FNP

## 2024-03-13 NOTE — Assessment & Plan Note (Signed)
 Lab Results  Component Value Date   HGBA1C 6.7 (A) 12/26/2023    Type 2 diabetes mellitus with hyperglycemia, diabetic retinopathy, and proteinuria Diabetes management ongoing with blood sugar levels 112-140 mg/dL. eGFR 113 . Diabetic retinopathy managed with laser and prednisone eye drops. Proteinuria present, considering Farxiga  or Jardiance for kidney protection, pending insurance coverage. Lisinopril  used for kidney protection. - Check insurance coverage for Farxiga  or Jardiance. - Continue metformin  1000 mg daily. - Order labs to check A1c in early September. - Encourage blood sugar monitoring with fasting levels 80-120 mg/dL and postprandial levels <170 mg/dL.

## 2024-03-13 NOTE — Assessment & Plan Note (Signed)
 Allergic conjunctivitis Recent onset of itchy, red eyes likely due to allergies. Symptoms include sneezing and headache. - Order allergy eye drops for symptomatic relief.

## 2024-03-13 NOTE — Assessment & Plan Note (Signed)
 Last vitamin D  Lab Results  Component Value Date   VD25OH 12.2 (L) 12/26/2023  On OTC vitamin D  supplement Will recheck labs

## 2024-03-13 NOTE — Assessment & Plan Note (Signed)
 Alcohol use, in remission Alcohol use discontinued, beneficial for managing overall health. - Continue abstaining from alcohol.

## 2024-03-13 NOTE — Assessment & Plan Note (Signed)
 Wt Readings from Last 3 Encounters:  03/13/24 274 lb (124.3 kg)  12/26/23 277 lb (125.6 kg)  09/20/23 274 lb (124.3 kg)   Body mass index is 40.46 kg/m.   Weight management crucial for diabetes and hypertension control. Current activity includes walking a mile every other day. Weight loss can help reverse diabetes and improve blood pressure control. - Encourage weight loss through increased physical activity and dietary modifications. - Recommend moderate exercise for 30 minutes, five days a week.

## 2024-03-13 NOTE — Assessment & Plan Note (Signed)
 BP Readings from Last 3 Encounters:  03/13/24 (!) 142/71  12/26/23 (!) 141/79  09/20/23 120/69   Blood pressure management ongoing with recent readings 124/80 mmHg. Home readings around 130/80 mmHg. Current medications include amlodipine  5 mg and lisinopril  20 mg daily. Goal is to maintain blood pressure <130/80 mmHg to prevent complications. - Encourage home blood pressure monitoring at least three times a week. - Maintain blood pressure goal of <130/80 mmHg.  Please with blood pressure readings consistently greater then 130/80

## 2024-03-15 ENCOUNTER — Other Ambulatory Visit: Payer: Self-pay

## 2024-03-29 ENCOUNTER — Other Ambulatory Visit: Payer: Self-pay

## 2024-03-29 DIAGNOSIS — E1165 Type 2 diabetes mellitus with hyperglycemia: Secondary | ICD-10-CM

## 2024-03-29 DIAGNOSIS — E559 Vitamin D deficiency, unspecified: Secondary | ICD-10-CM

## 2024-03-29 DIAGNOSIS — E785 Hyperlipidemia, unspecified: Secondary | ICD-10-CM

## 2024-03-30 LAB — LIPID PANEL
Chol/HDL Ratio: 3.6 ratio (ref 0.0–5.0)
Cholesterol, Total: 150 mg/dL (ref 100–199)
HDL: 42 mg/dL (ref >39)
LDL Chol Calc (NIH): 93 mg/dL (ref 0–99)
Triglycerides: 80 mg/dL (ref 0–149)
VLDL Cholesterol Cal: 15 mg/dL (ref 5–40)

## 2024-03-30 LAB — HEMOGLOBIN A1C
Est. average glucose Bld gHb Est-mCnc: 154 mg/dL
Hgb A1c MFr Bld: 7 % — ABNORMAL HIGH (ref 4.8–5.6)

## 2024-03-30 LAB — VITAMIN D 25 HYDROXY (VIT D DEFICIENCY, FRACTURES): Vit D, 25-Hydroxy: 29.2 ng/mL — ABNORMAL LOW (ref 30.0–100.0)

## 2024-04-01 ENCOUNTER — Other Ambulatory Visit: Payer: Self-pay | Admitting: Nurse Practitioner

## 2024-04-01 ENCOUNTER — Telehealth: Payer: Self-pay | Admitting: Nurse Practitioner

## 2024-04-01 ENCOUNTER — Ambulatory Visit: Payer: Self-pay | Admitting: Nurse Practitioner

## 2024-04-01 ENCOUNTER — Other Ambulatory Visit: Payer: Self-pay

## 2024-04-01 MED ORDER — ATORVASTATIN CALCIUM 80 MG PO TABS: 80.0000 mg | ORAL_TABLET | Freq: Every day | ORAL | 3 refills | Status: AC

## 2024-04-01 NOTE — Telephone Encounter (Unsigned)
 Copied from CRM [id number]. Topic: Clinical - Medication Refill >> Apr 01, 2024  3:53 PM [name] wrote: Medication:   -glucose blood (ACCU-CHEK GUIDE) test strip  -Accu-Chek FastClix Lancets MISC   Has the patient contacted their pharmacy? Yes (Agent: If no, request that the patient contact the pharmacy for the refill. If patient does not wish to contact the pharmacy document the reason why and proceed with request.) (Agent: If yes, when and what did the pharmacy advise?)  This is the patient's preferred pharmacy:  [facility] [id number] [place] - [address] [place] [address] Phone: [phone] Fax: [phone]  Is this the correct pharmacy for this prescription? Yes If no, delete pharmacy and type the correct one.   Has the prescription been filled recently? No  Is the patient out of the medication? Yes  Has the patient been seen for an appointment in the last year OR does the patient have an upcoming appointment? Yes  Can we respond through MyChart? No  Agent: Please be advised that Rx refills may take up to 3 business days. We ask that you follow-up with your pharmacy.

## 2024-04-01 NOTE — Addendum Note (Signed)
 Addended by: JUANICE THOMES SAUNDERS on: 04/01/2024 08:21 AM   Modules accepted: Orders

## 2024-04-02 ENCOUNTER — Other Ambulatory Visit: Payer: Self-pay

## 2024-04-02 MED ORDER — ACCU-CHEK GUIDE TEST VI STRP: ORAL_STRIP | 12 refills | Status: AC

## 2024-04-02 MED ORDER — ACCU-CHEK SOFTCLIX LANCETS MISC: 12 refills | Status: AC

## 2024-04-03 ENCOUNTER — Other Ambulatory Visit: Payer: Self-pay

## 2024-04-19 ENCOUNTER — Other Ambulatory Visit

## 2024-04-22 ENCOUNTER — Other Ambulatory Visit

## 2024-05-03 ENCOUNTER — Ambulatory Visit
Admission: RE | Admit: 2024-05-03 | Discharge: 2024-05-03 | Disposition: A | Discharge status: Home / Self Care | Source: Ambulatory Visit | Attending: Nephrology | Admitting: Nephrology

## 2024-05-03 DIAGNOSIS — R809 Proteinuria, unspecified: Secondary | ICD-10-CM

## 2024-06-03 ENCOUNTER — Ambulatory Visit: Payer: Self-pay | Admitting: Nurse Practitioner

## 2024-06-20 ENCOUNTER — Other Ambulatory Visit: Payer: Self-pay | Admitting: Nurse Practitioner

## 2024-06-20 DIAGNOSIS — I10 Essential (primary) hypertension: Secondary | ICD-10-CM

## 2024-07-06 ENCOUNTER — Other Ambulatory Visit: Payer: Self-pay | Admitting: Nurse Practitioner

## 2024-07-06 DIAGNOSIS — I10 Essential (primary) hypertension: Secondary | ICD-10-CM

## 2024-07-15 ENCOUNTER — Ambulatory Visit: Payer: Self-pay | Admitting: Nurse Practitioner

## 2024-07-22 ENCOUNTER — Other Ambulatory Visit: Payer: Self-pay | Admitting: Nurse Practitioner

## 2024-07-22 DIAGNOSIS — E1165 Type 2 diabetes mellitus with hyperglycemia: Secondary | ICD-10-CM

## 2024-08-23 ENCOUNTER — Encounter: Payer: Self-pay | Admitting: Nurse Practitioner

## 2024-08-23 ENCOUNTER — Ambulatory Visit: Admitting: Nurse Practitioner

## 2024-08-23 VITALS — BP 146/88 | HR 87 | Wt 275.0 lb

## 2024-08-23 DIAGNOSIS — R809 Proteinuria, unspecified: Secondary | ICD-10-CM

## 2024-08-23 DIAGNOSIS — E559 Vitamin D deficiency, unspecified: Secondary | ICD-10-CM | POA: Diagnosis not present

## 2024-08-23 DIAGNOSIS — E1165 Type 2 diabetes mellitus with hyperglycemia: Secondary | ICD-10-CM

## 2024-08-23 DIAGNOSIS — I10 Essential (primary) hypertension: Secondary | ICD-10-CM

## 2024-08-23 DIAGNOSIS — E785 Hyperlipidemia, unspecified: Secondary | ICD-10-CM | POA: Diagnosis not present

## 2024-08-23 DIAGNOSIS — E1129 Type 2 diabetes mellitus with other diabetic kidney complication: Secondary | ICD-10-CM

## 2024-08-23 LAB — POCT GLYCOSYLATED HEMOGLOBIN (HGB A1C): Hemoglobin A1C: 6.7 % — AB (ref 4.0–5.6)

## 2024-08-23 MED ORDER — LISINOPRIL 40 MG PO TABS: 40.0000 mg | ORAL_TABLET | Freq: Every day | ORAL | 1 refills | Status: AC

## 2024-08-23 MED ORDER — AMLODIPINE BESYLATE 5 MG PO TABS: 5.0000 mg | ORAL_TABLET | Freq: Every day | ORAL | 1 refills | Status: AC

## 2024-08-23 NOTE — Assessment & Plan Note (Addendum)
" ° ° °    08/23/2024   11:44 AM 08/23/2024   11:33 AM 03/13/2024    9:30 AM 03/13/2024    9:22 AM 12/26/2023   11:52 AM 12/26/2023   11:40 AM 09/20/2023   11:18 AM  BP/Weight  Systolic BP 146 140 142 143 141 149 120  Diastolic BP 88 95 71 76 79 79 69  Wt. (Lbs)  275  274  277   BMI  40.61 kg/m2  40.46 kg/m2  40.91 kg/m2    Blood pressure elevated despite lisinopril  20 mg and amlodipine  5 mg. Increased alcohol and stress may contribute. Risk of kidney damage due to proteinuria. - Increased lisinopril  to 40 mg daily.  Continue amlodipine  5 mg daily - Advised blood pressure goal of less than 130/80 mmHg. - Recommended reducing alcohol intake to no more than two drinks per day or abstaining. - Scheduled follow-up in 4 weeks to reassess blood pressure control. DASH diet advised, encouraged moderate to vigorous exercise at least 30 minutes 5 days a week as tolerated  Recent eGFR 96, creatinine 1.01, potassium not performed specimen hemolyzed, checking potassium level today "

## 2024-08-23 NOTE — Assessment & Plan Note (Signed)
 Continue Farxiga  10 mg daily Followed by nephrologist Recent albumin creatinine ratio 1992 labs collected 08/21/2024

## 2024-08-23 NOTE — Progress Notes (Signed)
 "  Established Patient Office Visit  Subjective:  Patient ID: [name], male    DOB: 20-Feb-1986  Age: 39 y.o. MRN: [id number]  CC:  Chief Complaint  Patient presents with   Diabetes    HPI   Discussed the use of AI scribe software for clinical note transcription with the patient, who gave verbal consent to proceed.  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  who presents for follow-up.  He is managing his diabetes with Farxiga  10 mg daily and has recently secured a three-month supply. He has stopped taking metformin  and is hesitant to start Ozempic  due to concerns about complications and insurance issues. His last A1c was 6.7%. He has not been exercising since the weather turned cold but plans to resume walking. His diet is generally good, with occasional indulgences on weekends.  He reports that his blood pressure has been high during medical visits. He attributes this to increased alcohol consumption due to stress from relocating and considering a job change. He is taking lisinopril  20 mg and amlodipine  5 mg daily. His blood pressure has been consistently high over the past few months.  For hyperlipidemia, he is taking atorvastatin  80 mg daily. His last cholesterol check four months ago showed an LDL of 93 mg/dL.  He has stopped smoking, which he considers a positive change. However, he admits to drinking more alcohol than usual, which he recognizes as a factor in his elevated blood pressure.  He mentions a family history of hypertension, noting that his mother and sister also have high blood pressure.    Assessment & Plan .      Past Medical History:   Diagnosis Date   Actinomyces infection 08/10/2022   Allergy    Asthma    Breast abscess 06/28/2022   Chest wall abscess 08/10/2022   Diabetes mellitus without complication (HCC)    Hyperlipidemia    Hypertension    Intertrigo 08/10/2022   Obesity    Tobacco use disorder    Vitamin D  deficiency     Past Surgical History:  Procedure Laterality Date   INCISION AND DRAINAGE ABSCESS Left 07/01/2022   Procedure: INCISION AND DRAINAGE ABSCESS;  Surgeon: Curvin Deward MOULD, MD;  Location: [facility] OR;  Service: General;  Laterality: Left;   IRRIGATION AND DEBRIDEMENT ABSCESS Left 07/03/2022   Procedure: IRRIGATION AND DEBRIDEMENT CHEST WALL / Breast ABSCESS;  Surgeon: Sebastian Moles, MD;  Location: [facility] OR;  Service: General;  Laterality: Left;    Family History  Problem Relation Age of Onset   Diabetes Mother    Breast cancer Maternal Grandmother    Diabetes Paternal Grandmother    Diabetes Paternal Grandfather    Colon cancer Neg Hx     Social History   Socioeconomic History   Marital status: Single    Spouse name: Not on file   Number of children: 1   Years of education: Not on file   Highest education level: Not on file  Occupational History   Not on file  Tobacco Use   Smoking status: Former    Current packs/day: 0.00    Types: Cigarettes    Quit date: 08/2023    Years since quitting: 0.9    Passive exposure: Never  Smokeless tobacco: Never  Substance and Sexual Activity   Alcohol use: Yes    Alcohol/week: 13.0 standard drinks of alcohol    Types: 9 Cans of beer, 4 Shots of liquor per week    Comment: weekends only . 6 packs on those day.   Drug use: Not Currently    Types: Marijuana   Sexual activity: Yes  Other Topics Concern   Not on file  Social History Narrative   Lives with his mother.    Social Drivers of Health   Tobacco Use: Medium Risk (08/23/2024)   Patient History    Smoking Tobacco Use: Former    Smokeless Tobacco Use: Never    Passive Exposure:  Never  Programmer, Applications: Not on file  Food Insecurity: No Food Insecurity (08/23/2024)   Epic    Worried About Programme Researcher, Broadcasting/film/video in the Last Year: Never true    Ran Out of Food in the Last Year: Never true  Transportation Needs: No Transportation Needs (08/23/2024)   Epic    Lack of Transportation (Medical): No    Lack of Transportation (Non-Medical): No  Physical Activity: Not on file  Stress: Not on file  Social Connections: Not on file  Intimate Partner Violence: Not At Risk (08/23/2024)   Epic    Fear of Current or Ex-Partner: No    Emotionally Abused: No    Physically Abused: No    Sexually Abused: No  Depression (PHQ2-9): Low Risk (08/23/2024)   Depression (PHQ2-9)    PHQ-2 Score: 0  Alcohol Screen: Not on file  Housing: Low Risk (08/23/2024)   Epic    Unable to Pay for Housing in the Last Year: No    Number of Times Moved in the Last Year: 0    Homeless in the Last Year: No  Utilities: Not At Risk (08/23/2024)   Epic    Threatened with loss of utilities: No  Health Literacy: Not on file    Outpatient Medications Prior to Visit  Medication Sig Dispense Refill   Accu-Chek FastClix Lancets MISC USE AS DIRECTED FOUR TIMES A DAY BEFORE MEALS AND AT BEDTIME 102 each 0   Accu-Chek Softclix Lancets lancets Use as instructed 100 each 12   atorvastatin  (LIPITOR) 80 MG tablet Take 1 tablet (80 mg total) by mouth daily. 90 tablet 3   blood glucose meter kit and supplies KIT Use as directed 1 each 0   Blood Pressure KIT Please check you blood pressure daily. 1 kit 0   FARXIGA  10 MG TABS tablet Take 1 tablet (10 mg total) by mouth daily before breakfast. 30 tablet 0   glucose blood (ACCU-CHEK GUIDE TEST) test strip Use as instructed 100 each 12   glucose blood (ACCU-CHEK GUIDE) test strip Use as instructed 100 strip 2   VITAMIN D  PO Take by mouth.     amLODipine  (NORVASC ) 5 MG tablet Take 1 tablet (5 mg total) by mouth daily. 90 tablet 0   lisinopril  (ZESTRIL ) 20 MG tablet  Take 1 tablet (20 mg total) by mouth daily. 90 tablet 0   Clotrimazole  1 % OINT Apply to affected and surrounding area in between toes  twice daily for 2 week (Patient not taking: Reported on 08/23/2024) 56 g 0   gabapentin  (NEURONTIN ) 100 MG capsule Take 1 capsule (100 mg total) by mouth 3 (three) times daily. (Patient not taking: Reported on 08/23/2024) 90 capsule 3   Probiotic Product (RISA-BID PROBIOTIC) TABS Take 1 tablet by mouth 2 (  two) times daily. (Patient not taking: Reported on 08/23/2024) 60 tablet 2   Vitamin D , Ergocalciferol , (DRISDOL ) 1.25 MG (50000 UNIT) CAPS capsule Take 1 capsule (50,000 Units total) by mouth every 7 (seven) days. (Patient not taking: Reported on 08/23/2024) 8 capsule 0   acetaminophen  (TYLENOL ) 325 MG tablet Take 2 tablets (650 mg total) by mouth every 8 (eight) hours as needed for moderate pain. (Patient not taking: Reported on 08/23/2024) 20 tablet 0   metFORMIN  (GLUCOPHAGE -XR) 500 MG 24 hr tablet Take 2 tablets with food daily. (Patient not taking: Reported on 08/23/2024) 180 tablet 1   Olopatadine  HCl 0.2 % SOLN Instill 1 drop into each  eye once daily (Patient not taking: Reported on 08/23/2024) 2.5 mL 0   trimethoprim -polymyxin b  (POLYTRIM ) ophthalmic solution Place 1 drop into the left eye every 6 (six) hours. (Patient not taking: Reported on 08/23/2024) 10 mL 0   No facility-administered medications prior to visit.    Allergies[1]  ROS Review of Systems  Constitutional:  Negative for appetite change, chills, fatigue and fever.  HENT:  Negative for congestion, postnasal drip, rhinorrhea and sneezing.   Respiratory:  Negative for cough, shortness of breath and wheezing.   Cardiovascular:  Negative for chest pain, palpitations and leg swelling.  Gastrointestinal:  Negative for abdominal pain, constipation, nausea and vomiting.  Genitourinary:  Negative for difficulty urinating, dysuria, flank pain and frequency.  Musculoskeletal:  Negative for arthralgias,  back pain, joint swelling and myalgias.  Skin:  Negative for color change, pallor, rash and wound.  Neurological:  Negative for dizziness, facial asymmetry, weakness, numbness and headaches.  Psychiatric/Behavioral:  Negative for behavioral problems, confusion, self-injury and suicidal ideas.       Objective:    Physical Exam Vitals and nursing note reviewed.  Constitutional:      General: He is not in acute distress.    Appearance: Normal appearance. He is obese. He is not ill-appearing, toxic-appearing or diaphoretic.  Eyes:     General: No scleral icterus.       Right eye: No discharge.        Left eye: No discharge.     Extraocular Movements: Extraocular movements intact.     Conjunctiva/sclera: Conjunctivae normal.  Cardiovascular:     Rate and Rhythm: Normal rate and regular rhythm.     Pulses: Normal pulses.     Heart sounds: Normal heart sounds. No murmur heard.    No friction rub. No gallop.  Pulmonary:     Effort: Pulmonary effort is normal. No respiratory distress.     Breath sounds: Normal breath sounds. No stridor. No wheezing, rhonchi or rales.  Chest:     Chest wall: No tenderness.  Abdominal:     General: There is no distension.     Palpations: Abdomen is soft.     Tenderness: There is no abdominal tenderness. There is no right CVA tenderness, left CVA tenderness or guarding.  Musculoskeletal:        General: No swelling, tenderness, deformity or signs of injury.     Right lower leg: No edema.     Left lower leg: No edema.  Skin:    General: Skin is warm and dry.     Capillary Refill: Capillary refill takes less than 2 seconds.     Coloration: Skin is not jaundiced or pale.     Findings: No bruising, erythema or lesion.  Neurological:     Mental Status: He is alert and oriented to person, place,  and time.     Motor: No weakness.     Coordination: Coordination normal.     Gait: Gait normal.  Psychiatric:        Mood and Affect: Mood normal.         Behavior: Behavior normal.        Thought Content: Thought content normal.        Judgment: Judgment normal.     BP (!) 146/88   Pulse 87   Wt 275 lb (124.7 kg)   SpO2 100%   BMI 40.61 kg/m  Wt Readings from Last 3 Encounters:  08/23/24 275 lb (124.7 kg)  03/13/24 274 lb (124.3 kg)  12/26/23 277 lb (125.6 kg)    Lab Results  Component Value Date   TSH 3.788 06/24/2022   Lab Results  Component Value Date   WBC 7.5 12/26/2023   HGB 13.6 12/26/2023   HCT 41.4 12/26/2023   MCV 81 12/26/2023   PLT 364 12/26/2023   Lab Results  Component Value Date   NA 138 12/26/2023   K 4.9 12/26/2023   CO2 20 12/26/2023   GLUCOSE 111 (H) 12/26/2023   BUN 12 12/26/2023   CREATININE 0.86 12/26/2023   BILITOT 0.7 12/26/2023   ALKPHOS 99 12/26/2023   AST 21 12/26/2023   ALT 24 12/26/2023   PROT 7.9 12/26/2023   ALBUMIN 4.1 12/26/2023   CALCIUM  10.0 12/26/2023   ANIONGAP 7 07/11/2022   EGFR 113 12/26/2023   Lab Results  Component Value Date   CHOL 150 03/29/2024   Lab Results  Component Value Date   HDL 42 03/29/2024   Lab Results  Component Value Date   LDLCALC 93 03/29/2024   Lab Results  Component Value Date   TRIG 80 03/29/2024   Lab Results  Component Value Date   CHOLHDL 3.6 03/29/2024   Lab Results  Component Value Date   HGBA1C 6.7 (A) 08/23/2024      Assessment & Plan:   Problem List Items Addressed This Visit       Cardiovascular and Mediastinum   Primary hypertension        08/23/2024   11:44 AM 08/23/2024   11:33 AM 03/13/2024    9:30 AM 03/13/2024    9:22 AM 12/26/2023   11:52 AM 12/26/2023   11:40 AM 09/20/2023   11:18 AM  BP/Weight  Systolic BP 146 140 142 143 141 149 120  Diastolic BP 88 95 71 76 79 79 69  Wt. (Lbs)  275  274  277   BMI  40.61 kg/m2  40.46 kg/m2  40.91 kg/m2    Blood pressure elevated despite lisinopril  20 mg and amlodipine  5 mg. Increased alcohol and stress may contribute. Risk of kidney damage due to proteinuria. -  Increased lisinopril  to 40 mg daily.  Continue amlodipine  5 mg daily - Advised blood pressure goal of less than 130/80 mmHg. - Recommended reducing alcohol intake to no more than two drinks per day or abstaining. - Scheduled follow-up in 4 weeks to reassess blood pressure control. DASH diet advised, encouraged moderate to vigorous exercise at least 30 minutes 5 days a week as tolerated  Recent eGFR 96, creatinine 1.01, potassium not performed specimen hemolyzed, checking potassium level today      Relevant Medications   lisinopril  (ZESTRIL ) 40 MG tablet   amLODipine  (NORVASC ) 5 MG tablet   Other Relevant Orders   Potassium     Endocrine   Type 2 diabetes mellitus with hyperglycemia (  HCC) - Primary   Lab Results  Component Value Date   HGBA1C 6.7 (A) 08/23/2024   Type 2 diabetes mellitus with hyperglycemia A1c controlled at 6.7%. On Farxiga  10 mg. Declined Ozempic  due to concerns and insurance. Acknowledged lack of regular exercise, maintains healthy diet with occasional splurges. - Continue Farxiga  10 mg daily. - Encouraged regular exercise and dietary modifications to maintain glycemic control.       Relevant Medications   lisinopril  (ZESTRIL ) 40 MG tablet   Other Relevant Orders   POCT glycosylated hemoglobin (Hb A1C) (Completed)   Microalbuminuria due to type 2 diabetes mellitus (HCC)   Continue Farxiga  10 mg daily Followed by nephrologist Recent albumin creatinine ratio 1992 labs collected 08/21/2024      Relevant Medications   lisinopril  (ZESTRIL ) 40 MG tablet     Other   Morbid obesity (HCC)   Wt Readings from Last 3 Encounters:  08/23/24 275 lb (124.7 kg)  03/13/24 274 lb (124.3 kg)  12/26/23 277 lb (125.6 kg)   Body mass index is 40.61 kg/m.  Not interested in a GLP-1 Patient counseled on low-carb diet Encouraged moderate to vigorous exercise at least 150 minutes weekly as tolerated      Dyslipidemia, goal LDL below 70   Relevant Medications   lisinopril   (ZESTRIL ) 40 MG tablet   amLODipine  (NORVASC ) 5 MG tablet   Other Relevant Orders   Lipid panel   Vitamin D  deficiency   Last vitamin D  Lab Results  Component Value Date   VD25OH 29.2 (L) 03/29/2024  On vitamin D  1000 units daily      Relevant Orders   VITAMIN D  25 Hydroxy (Vit-D Deficiency, Fractures)    Meds ordered this encounter  Medications   lisinopril  (ZESTRIL ) 40 MG tablet    Sig: Take 1 tablet (40 mg total) by mouth daily.    Dispense:  90 tablet    Refill:  1   amLODipine  (NORVASC ) 5 MG tablet    Sig: Take 1 tablet (5 mg total) by mouth daily.    Dispense:  90 tablet    Refill:  1    Follow-up: Return in about 4 weeks (around 09/20/2024) for HTN.    Aaran Enberg R Prathik Aman, FNP     [1]  Allergies Allergen Reactions   Bee Pollen    Peach [Prunus Persica]     Pt reports swelling and lip tingling as reaction   "

## 2024-08-23 NOTE — Patient Instructions (Signed)
 Around 3 times per week, check your blood pressure 2 times per day. once in the morning and once in the evening. The readings should be at least one minute apart. Write down these values and bring them to your next nurse visit/appointment.  When you check your BP, make sure you have been doing something calm/relaxing 5 minutes prior to checking. Both feet should be flat on the floor and you should be sitting. Use your left arm and make sure it is in a relaxed position (on a table), and that the cuff is at the approximate level/height of your heart.   1. Type 2 diabetes mellitus with hyperglycemia, without long-term current use of insulin  (HCC) (Primary) - POCT glycosylated hemoglobin (Hb A1C)  2. Vitamin D  deficiency - VITAMIN D  25 Hydroxy (Vit-D Deficiency, Fractures)  3. Dyslipidemia, goal LDL below 70 - Lipid panel  4. Primary hypertension - lisinopril  (ZESTRIL ) 40 MG tablet; Take 1 tablet (40 mg total) by mouth daily.  Dispense: 90 tablet; Refill: 1      It is important that you exercise regularly at least 30 minutes 5 times a week as tolerated  Think about what you will eat, plan ahead. Choose  clean, green, fresh or frozen over canned, processed or packaged foods which are more sugary, salty and fatty. 70 to 75% of food eaten should be vegetables and fruit. Three meals at set times with snacks allowed between meals, but they must be fruit or vegetables. Aim to eat over a 12 hour period , example 7 am to 7 pm, and STOP after  your last meal of the day. Drink water,generally about 64 ounces per day, no other drink is as healthy. Fruit juice is best enjoyed in a healthy way, by EATING the fruit.  Thanks for choosing [facility] we consider it a privelige to serve you.

## 2024-08-23 NOTE — Assessment & Plan Note (Signed)
 Lab Results  Component Value Date   HGBA1C 6.7 (A) 08/23/2024   Type 2 diabetes mellitus with hyperglycemia A1c controlled at 6.7%. On Farxiga  10 mg. Declined Ozempic  due to concerns and insurance. Acknowledged lack of regular exercise, maintains healthy diet with occasional splurges. - Continue Farxiga  10 mg daily. - Encouraged regular exercise and dietary modifications to maintain glycemic control.

## 2024-08-23 NOTE — Assessment & Plan Note (Signed)
 Last vitamin D  Lab Results  Component Value Date   VD25OH 29.2 (L) 03/29/2024  On vitamin D  1000 units daily

## 2024-08-23 NOTE — Assessment & Plan Note (Signed)
 Wt Readings from Last 3 Encounters:  08/23/24 275 lb (124.7 kg)  03/13/24 274 lb (124.3 kg)  12/26/23 277 lb (125.6 kg)   Body mass index is 40.61 kg/m.  Not interested in a GLP-1 Patient counseled on low-carb diet Encouraged moderate to vigorous exercise at least 150 minutes weekly as tolerated

## 2024-08-24 LAB — LIPID PANEL
Chol/HDL Ratio: 3.2 ratio (ref 0.0–5.0)
Cholesterol, Total: 227 mg/dL — ABNORMAL HIGH (ref 100–199)
HDL: 70 mg/dL
LDL Chol Calc (NIH): 143 mg/dL — ABNORMAL HIGH (ref 0–99)
Triglycerides: 83 mg/dL (ref 0–149)
VLDL Cholesterol Cal: 14 mg/dL (ref 5–40)

## 2024-08-24 LAB — POTASSIUM: Potassium: 4.9 mmol/L (ref 3.5–5.2)

## 2024-08-24 LAB — VITAMIN D 25 HYDROXY (VIT D DEFICIENCY, FRACTURES): Vit D, 25-Hydroxy: 32.1 ng/mL (ref 30.0–100.0)

## 2024-08-26 ENCOUNTER — Other Ambulatory Visit: Payer: Self-pay | Admitting: Nurse Practitioner

## 2024-08-26 ENCOUNTER — Ambulatory Visit: Payer: Self-pay | Admitting: Nurse Practitioner

## 2024-08-26 DIAGNOSIS — E785 Hyperlipidemia, unspecified: Secondary | ICD-10-CM

## 2024-08-27 ENCOUNTER — Other Ambulatory Visit: Payer: Self-pay | Admitting: Nurse Practitioner

## 2024-08-27 DIAGNOSIS — E785 Hyperlipidemia, unspecified: Secondary | ICD-10-CM

## 2024-08-27 MED ORDER — EZETIMIBE 10 MG PO TABS: 10.0000 mg | ORAL_TABLET | Freq: Every day | ORAL | 2 refills | Status: AC

## 2024-08-28 ENCOUNTER — Telehealth: Payer: Self-pay

## 2024-08-28 NOTE — Telephone Encounter (Signed)
 Copied from CRM #[id number]. Topic: Clinical - Lab/Test Results >> Aug 28, 2024  1:26 PM Joesph B wrote: Reason for CRM: results relayed

## 2024-09-20 ENCOUNTER — Ambulatory Visit: Payer: Self-pay | Admitting: Nurse Practitioner
